# Patient Record
Sex: Female | Born: 1985 | Race: White | Hispanic: No | Smoking: Former smoker
Health system: Southern US, Community
[De-identification: ages and names within clinical notes are randomized; demographics above are authoritative.]

## PROBLEM LIST (undated history)

## (undated) ENCOUNTER — Inpatient Hospital Stay (HOSPITAL_COMMUNITY): Payer: Self-pay

## (undated) DIAGNOSIS — Q6239 Other obstructive defects of renal pelvis and ureter: Secondary | ICD-10-CM

## (undated) DIAGNOSIS — N289 Disorder of kidney and ureter, unspecified: Secondary | ICD-10-CM

## (undated) DIAGNOSIS — Z87442 Personal history of urinary calculi: Secondary | ICD-10-CM

## (undated) DIAGNOSIS — K760 Fatty (change of) liver, not elsewhere classified: Secondary | ICD-10-CM

## (undated) DIAGNOSIS — N949 Unspecified condition associated with female genital organs and menstrual cycle: Principal | ICD-10-CM

## (undated) DIAGNOSIS — G8929 Other chronic pain: Secondary | ICD-10-CM

## (undated) DIAGNOSIS — K219 Gastro-esophageal reflux disease without esophagitis: Secondary | ICD-10-CM

## (undated) DIAGNOSIS — M545 Low back pain, unspecified: Secondary | ICD-10-CM

## (undated) DIAGNOSIS — R109 Unspecified abdominal pain: Secondary | ICD-10-CM

## (undated) DIAGNOSIS — I10 Essential (primary) hypertension: Secondary | ICD-10-CM

## (undated) DIAGNOSIS — R319 Hematuria, unspecified: Secondary | ICD-10-CM

## (undated) DIAGNOSIS — S83519A Sprain of anterior cruciate ligament of unspecified knee, initial encounter: Secondary | ICD-10-CM

## (undated) DIAGNOSIS — Q6211 Congenital occlusion of ureteropelvic junction: Secondary | ICD-10-CM

## (undated) DIAGNOSIS — Z87440 Personal history of urinary (tract) infections: Secondary | ICD-10-CM

## (undated) HISTORY — DX: Other obstructive defects of renal pelvis and ureter: Q62.39

## (undated) HISTORY — PX: NEPHRECTOMY: SHX65

## (undated) HISTORY — PX: CHOLECYSTECTOMY: SHX55

## (undated) HISTORY — DX: Personal history of urinary calculi: Z87.442

## (undated) HISTORY — PX: OTHER SURGICAL HISTORY: SHX169

## (undated) HISTORY — DX: Personal history of urinary (tract) infections: Z87.440

## (undated) HISTORY — PX: NASAL SINUS SURGERY: SHX719

## (undated) HISTORY — DX: Unspecified condition associated with female genital organs and menstrual cycle: N94.9

## (undated) HISTORY — DX: Fatty (change of) liver, not elsewhere classified: K76.0

## (undated) HISTORY — DX: Sprain of anterior cruciate ligament of unspecified knee, initial encounter: S83.519A

## (undated) HISTORY — DX: Disorder of kidney and ureter, unspecified: N28.9

## (undated) HISTORY — PX: TONSILLECTOMY: SUR1361

## (undated) HISTORY — DX: Essential (primary) hypertension: I10

---

## 2009-03-21 ENCOUNTER — Emergency Department (HOSPITAL_COMMUNITY): Admission: EM | Admit: 2009-03-21 | Discharge: 2009-03-21 | Payer: Self-pay | Admitting: Emergency Medicine

## 2009-05-02 ENCOUNTER — Emergency Department (HOSPITAL_COMMUNITY): Admission: EM | Admit: 2009-05-02 | Discharge: 2009-05-03 | Payer: Self-pay | Admitting: Emergency Medicine

## 2009-05-13 ENCOUNTER — Emergency Department (HOSPITAL_COMMUNITY): Admission: EM | Admit: 2009-05-13 | Discharge: 2009-05-13 | Payer: Self-pay | Admitting: Emergency Medicine

## 2009-06-01 ENCOUNTER — Emergency Department (HOSPITAL_COMMUNITY): Admission: EM | Admit: 2009-06-01 | Discharge: 2009-06-02 | Payer: Self-pay | Admitting: Emergency Medicine

## 2009-08-10 ENCOUNTER — Emergency Department (HOSPITAL_COMMUNITY): Admission: EM | Admit: 2009-08-10 | Discharge: 2009-08-10 | Payer: Self-pay | Admitting: Emergency Medicine

## 2009-08-24 ENCOUNTER — Emergency Department (HOSPITAL_COMMUNITY): Admission: EM | Admit: 2009-08-24 | Discharge: 2009-08-24 | Payer: Self-pay | Admitting: Emergency Medicine

## 2009-09-16 ENCOUNTER — Emergency Department (HOSPITAL_COMMUNITY): Admission: EM | Admit: 2009-09-16 | Discharge: 2009-09-16 | Payer: Self-pay | Admitting: Emergency Medicine

## 2009-09-17 ENCOUNTER — Ambulatory Visit (HOSPITAL_COMMUNITY): Admission: RE | Admit: 2009-09-17 | Discharge: 2009-09-17 | Payer: Self-pay | Admitting: Emergency Medicine

## 2009-10-02 ENCOUNTER — Emergency Department (HOSPITAL_COMMUNITY): Admission: EM | Admit: 2009-10-02 | Discharge: 2009-10-02 | Payer: Self-pay | Admitting: Emergency Medicine

## 2009-10-12 ENCOUNTER — Other Ambulatory Visit: Admission: RE | Admit: 2009-10-12 | Discharge: 2009-10-12 | Payer: Self-pay | Admitting: Obstetrics & Gynecology

## 2009-11-08 ENCOUNTER — Emergency Department (HOSPITAL_COMMUNITY): Admission: EM | Admit: 2009-11-08 | Discharge: 2009-11-08 | Payer: Self-pay | Admitting: Emergency Medicine

## 2009-11-11 ENCOUNTER — Ambulatory Visit (HOSPITAL_COMMUNITY): Admission: RE | Admit: 2009-11-11 | Discharge: 2009-11-11 | Payer: Self-pay | Admitting: Obstetrics & Gynecology

## 2009-11-24 ENCOUNTER — Ambulatory Visit (HOSPITAL_COMMUNITY): Admission: RE | Admit: 2009-11-24 | Discharge: 2009-11-24 | Payer: Self-pay | Admitting: Obstetrics & Gynecology

## 2009-12-22 ENCOUNTER — Ambulatory Visit (HOSPITAL_COMMUNITY): Admission: RE | Admit: 2009-12-22 | Discharge: 2009-12-22 | Payer: Self-pay | Admitting: Obstetrics & Gynecology

## 2009-12-28 ENCOUNTER — Inpatient Hospital Stay (HOSPITAL_COMMUNITY): Admission: AD | Admit: 2009-12-28 | Discharge: 2009-12-28 | Payer: Self-pay | Admitting: Obstetrics & Gynecology

## 2010-01-19 ENCOUNTER — Ambulatory Visit (HOSPITAL_COMMUNITY)
Admission: RE | Admit: 2010-01-19 | Discharge: 2010-01-19 | Payer: Self-pay | Source: Home / Self Care | Attending: Obstetrics & Gynecology | Admitting: Obstetrics & Gynecology

## 2010-01-20 ENCOUNTER — Emergency Department (HOSPITAL_COMMUNITY)
Admission: EM | Admit: 2010-01-20 | Discharge: 2010-01-20 | Payer: Self-pay | Source: Home / Self Care | Admitting: Emergency Medicine

## 2010-02-16 ENCOUNTER — Ambulatory Visit (HOSPITAL_COMMUNITY)
Admission: RE | Admit: 2010-02-16 | Discharge: 2010-02-16 | Payer: Self-pay | Source: Home / Self Care | Attending: Obstetrics & Gynecology | Admitting: Obstetrics & Gynecology

## 2010-02-19 ENCOUNTER — Inpatient Hospital Stay (HOSPITAL_COMMUNITY)
Admission: AD | Admit: 2010-02-19 | Discharge: 2010-02-19 | Payer: Self-pay | Source: Home / Self Care | Attending: Family Medicine | Admitting: Family Medicine

## 2010-02-25 ENCOUNTER — Inpatient Hospital Stay (HOSPITAL_COMMUNITY)
Admission: AD | Admit: 2010-02-25 | Discharge: 2010-02-25 | Payer: Self-pay | Source: Home / Self Care | Attending: Obstetrics and Gynecology | Admitting: Obstetrics and Gynecology

## 2010-02-26 ENCOUNTER — Ambulatory Visit (HOSPITAL_COMMUNITY)
Admission: RE | Admit: 2010-02-26 | Discharge: 2010-02-26 | Payer: Self-pay | Source: Home / Self Care | Attending: Obstetrics and Gynecology | Admitting: Obstetrics and Gynecology

## 2010-03-01 LAB — URINALYSIS, ROUTINE W REFLEX MICROSCOPIC
Bilirubin Urine: NEGATIVE
Hgb urine dipstick: NEGATIVE
Ketones, ur: NEGATIVE mg/dL
Nitrite: NEGATIVE
Protein, ur: NEGATIVE mg/dL
Specific Gravity, Urine: 1.02 (ref 1.005–1.030)
Urine Glucose, Fasting: NEGATIVE mg/dL
Urobilinogen, UA: 0.2 mg/dL (ref 0.0–1.0)
pH: 6.5 (ref 5.0–8.0)

## 2010-03-01 LAB — WET PREP, GENITAL
Clue Cells Wet Prep HPF POC: NONE SEEN
Clue Cells Wet Prep HPF POC: NONE SEEN
Trich, Wet Prep: NONE SEEN
Trich, Wet Prep: NONE SEEN
Yeast Wet Prep HPF POC: NONE SEEN
Yeast Wet Prep HPF POC: NONE SEEN

## 2010-03-01 LAB — GC/CHLAMYDIA PROBE AMP, GENITAL
Chlamydia, DNA Probe: NEGATIVE
Chlamydia, DNA Probe: NEGATIVE
GC Probe Amp, Genital: NEGATIVE
GC Probe Amp, Genital: NEGATIVE

## 2010-03-01 LAB — URINE MICROSCOPIC-ADD ON

## 2010-03-01 LAB — FETAL FIBRONECTIN: Fetal Fibronectin: NEGATIVE

## 2010-03-01 LAB — STREP B DNA PROBE: Strep Group B Ag: NEGATIVE

## 2010-03-16 ENCOUNTER — Ambulatory Visit (HOSPITAL_COMMUNITY)
Admission: RE | Admit: 2010-03-16 | Discharge: 2010-03-16 | Payer: Self-pay | Source: Ambulatory Visit | Attending: Obstetrics & Gynecology | Admitting: Obstetrics & Gynecology

## 2010-03-16 DIAGNOSIS — K649 Unspecified hemorrhoids: Secondary | ICD-10-CM

## 2010-03-20 ENCOUNTER — Inpatient Hospital Stay (HOSPITAL_COMMUNITY)
Admission: AD | Admit: 2010-03-20 | Discharge: 2010-03-20 | Disposition: A | Discharge status: Home / Self Care | Payer: Medicaid Other | Source: Ambulatory Visit | Attending: Obstetrics & Gynecology | Admitting: Obstetrics & Gynecology

## 2010-03-20 DIAGNOSIS — O4700 False labor before 37 completed weeks of gestation, unspecified trimester: Secondary | ICD-10-CM | POA: Insufficient documentation

## 2010-03-20 DIAGNOSIS — O228X9 Other venous complications in pregnancy, unspecified trimester: Secondary | ICD-10-CM | POA: Insufficient documentation

## 2010-03-20 DIAGNOSIS — K649 Unspecified hemorrhoids: Secondary | ICD-10-CM | POA: Insufficient documentation

## 2010-03-20 LAB — URINE MICROSCOPIC-ADD ON

## 2010-03-20 LAB — URINALYSIS, ROUTINE W REFLEX MICROSCOPIC
Bilirubin Urine: NEGATIVE
Ketones, ur: NEGATIVE mg/dL
Nitrite: NEGATIVE
Protein, ur: NEGATIVE mg/dL
Specific Gravity, Urine: 1.015 (ref 1.005–1.030)
Urine Glucose, Fasting: NEGATIVE mg/dL
Urobilinogen, UA: 0.2 mg/dL (ref 0.0–1.0)
pH: 6 (ref 5.0–8.0)

## 2010-03-25 ENCOUNTER — Inpatient Hospital Stay (HOSPITAL_COMMUNITY)
Admission: AD | Admit: 2010-03-25 | Discharge: 2010-03-25 | Disposition: A | Discharge status: Home / Self Care | Payer: Medicaid Other | Source: Ambulatory Visit | Attending: Obstetrics & Gynecology | Admitting: Obstetrics & Gynecology

## 2010-03-25 DIAGNOSIS — O4700 False labor before 37 completed weeks of gestation, unspecified trimester: Secondary | ICD-10-CM

## 2010-03-25 LAB — URINALYSIS, ROUTINE W REFLEX MICROSCOPIC
Bilirubin Urine: NEGATIVE
Hgb urine dipstick: NEGATIVE
Ketones, ur: NEGATIVE mg/dL
Nitrite: NEGATIVE
Protein, ur: NEGATIVE mg/dL
Specific Gravity, Urine: 1.02 (ref 1.005–1.030)
Urine Glucose, Fasting: NEGATIVE mg/dL
Urobilinogen, UA: 0.2 mg/dL (ref 0.0–1.0)
pH: 6 (ref 5.0–8.0)

## 2010-03-25 LAB — URINE MICROSCOPIC-ADD ON

## 2010-04-01 ENCOUNTER — Inpatient Hospital Stay (HOSPITAL_COMMUNITY)
Admission: AD | Admit: 2010-04-01 | Discharge: 2010-04-04 | DRG: 775 | Disposition: A | Discharge status: Home / Self Care | Payer: Medicaid Other | Source: Ambulatory Visit | Attending: Obstetrics and Gynecology | Admitting: Obstetrics and Gynecology

## 2010-04-01 DIAGNOSIS — O4290 Premature rupture of membranes, unspecified as to length of time between rupture and onset of labor, unspecified weeks of gestation: Principal | ICD-10-CM | POA: Diagnosis present

## 2010-04-01 LAB — CBC
HCT: 38 % (ref 36.0–46.0)
Hemoglobin: 12.6 g/dL (ref 12.0–15.0)
MCH: 29.4 pg (ref 26.0–34.0)
MCHC: 33.2 g/dL (ref 30.0–36.0)
MCV: 88.8 fL (ref 78.0–100.0)
Platelets: 243 10*3/uL (ref 150–400)
RBC: 4.28 MIL/uL (ref 3.87–5.11)
RDW: 13.7 % (ref 11.5–15.5)
WBC: 13 10*3/uL — ABNORMAL HIGH (ref 4.0–10.5)

## 2010-04-01 LAB — RPR: RPR Ser Ql: NONREACTIVE

## 2010-04-02 DIAGNOSIS — O4290 Premature rupture of membranes, unspecified as to length of time between rupture and onset of labor, unspecified weeks of gestation: Secondary | ICD-10-CM

## 2010-04-29 LAB — URINALYSIS, ROUTINE W REFLEX MICROSCOPIC
Bilirubin Urine: NEGATIVE
Glucose, UA: NEGATIVE mg/dL
Hgb urine dipstick: NEGATIVE
Ketones, ur: NEGATIVE mg/dL
Nitrite: NEGATIVE
Protein, ur: NEGATIVE mg/dL
Specific Gravity, Urine: 1.027 (ref 1.005–1.030)
Urobilinogen, UA: 0.2 mg/dL (ref 0.0–1.0)
pH: 6 (ref 5.0–8.0)

## 2010-04-29 LAB — URINE CULTURE

## 2010-04-29 LAB — URINE MICROSCOPIC-ADD ON

## 2010-04-30 LAB — BASIC METABOLIC PANEL
BUN: 11 mg/dL (ref 6–23)
Chloride: 105 mEq/L (ref 96–112)
GFR calc non Af Amer: >60 mL/min (ref >60)
Glucose, Bld: 92 mg/dL (ref 70–99)
Potassium: 3.6 mEq/L (ref 3.5–5.1)

## 2010-04-30 LAB — URINALYSIS, ROUTINE W REFLEX MICROSCOPIC
Glucose, UA: NEGATIVE mg/dL
Hgb urine dipstick: NEGATIVE
Specific Gravity, Urine: >1.03 — ABNORMAL HIGH (ref 1.005–1.030)
Urobilinogen, UA: 0.2 mg/dL (ref 0.0–1.0)
pH: 6 (ref 5.0–8.0)

## 2010-04-30 LAB — CBC
HCT: 36.4 % (ref 36.0–46.0)
MCHC: 34.7 g/dL (ref 30.0–36.0)
MCV: 87.3 fL (ref 78.0–100.0)
RDW: 13.1 % (ref 11.5–15.5)

## 2010-04-30 LAB — URINE CULTURE: Culture  Setup Time: 201108050009

## 2010-04-30 LAB — DIFFERENTIAL
Basophils Absolute: 0.1 10*3/uL (ref 0.0–0.1)
Basophils Relative: 1 % (ref 0–1)
Eosinophils Relative: 1 % (ref 0–5)
Monocytes Absolute: 1.1 10*3/uL — ABNORMAL HIGH (ref 0.1–1.0)

## 2010-05-02 LAB — URINALYSIS, ROUTINE W REFLEX MICROSCOPIC
Bilirubin Urine: NEGATIVE
Ketones, ur: NEGATIVE mg/dL
Nitrite: NEGATIVE
Urobilinogen, UA: 0.2 mg/dL (ref 0.0–1.0)

## 2010-05-02 LAB — COMPREHENSIVE METABOLIC PANEL
AST: 20 U/L (ref 0–37)
BUN: 16 mg/dL (ref 6–23)
CO2: 26 mEq/L (ref 19–32)
Calcium: 8.8 mg/dL (ref 8.4–10.5)
Creatinine, Ser: 1.05 mg/dL (ref 0.4–1.2)
GFR calc Af Amer: >60 mL/min (ref >60)
GFR calc non Af Amer: >60 mL/min (ref >60)

## 2010-05-02 LAB — CBC
Hemoglobin: 12.6 g/dL (ref 12.0–15.0)
MCH: 29.9 pg (ref 26.0–34.0)
MCHC: 34.4 g/dL (ref 30.0–36.0)
Platelets: 293 10*3/uL (ref 150–400)

## 2010-05-02 LAB — POCT PREGNANCY, URINE: Preg Test, Ur: POSITIVE

## 2010-05-02 LAB — BASIC METABOLIC PANEL
BUN: 11 mg/dL (ref 6–23)
Creatinine, Ser: 1.01 mg/dL (ref 0.4–1.2)
GFR calc non Af Amer: >60 mL/min (ref >60)

## 2010-05-04 LAB — PREGNANCY, URINE: Preg Test, Ur: NEGATIVE

## 2010-05-04 LAB — URINALYSIS, ROUTINE W REFLEX MICROSCOPIC
Glucose, UA: NEGATIVE mg/dL
Nitrite: NEGATIVE
Protein, ur: NEGATIVE mg/dL
Urobilinogen, UA: 0.2 mg/dL (ref 0.0–1.0)

## 2010-05-06 LAB — DIFFERENTIAL
Basophils Absolute: 0.1 10*3/uL (ref 0.0–0.1)
Basophils Relative: 1 % (ref 0–1)
Eosinophils Relative: 2 % (ref 0–5)
Lymphocytes Relative: 24 % (ref 12–46)
Monocytes Absolute: 0.7 10*3/uL (ref 0.1–1.0)
Neutro Abs: 7.1 10*3/uL (ref 1.7–7.7)

## 2010-05-06 LAB — COMPREHENSIVE METABOLIC PANEL
AST: 49 U/L — ABNORMAL HIGH (ref 0–37)
Alkaline Phosphatase: 67 U/L (ref 39–117)
BUN: 12 mg/dL (ref 6–23)
CO2: 22 mEq/L (ref 19–32)
Chloride: 107 mEq/L (ref 96–112)
Creatinine, Ser: 0.9 mg/dL (ref 0.4–1.2)
GFR calc non Af Amer: >60 mL/min (ref >60)
Potassium: 4.1 mEq/L (ref 3.5–5.1)
Total Bilirubin: 0.8 mg/dL (ref 0.3–1.2)

## 2010-05-06 LAB — URINALYSIS, ROUTINE W REFLEX MICROSCOPIC
Ketones, ur: NEGATIVE mg/dL
Nitrite: NEGATIVE
Protein, ur: NEGATIVE mg/dL
Urobilinogen, UA: 1 mg/dL (ref 0.0–1.0)

## 2010-05-06 LAB — CBC
HCT: 43.4 % (ref 36.0–46.0)
MCV: 90.2 fL (ref 78.0–100.0)
RBC: 4.82 MIL/uL (ref 3.87–5.11)
WBC: 10.6 10*3/uL — ABNORMAL HIGH (ref 4.0–10.5)

## 2010-05-06 LAB — POCT PREGNANCY, URINE: Preg Test, Ur: NEGATIVE

## 2010-05-06 LAB — LIPASE, BLOOD: Lipase: 20 U/L (ref 11–59)

## 2010-05-10 LAB — URINALYSIS, ROUTINE W REFLEX MICROSCOPIC
Glucose, UA: NEGATIVE mg/dL
Hgb urine dipstick: NEGATIVE
Ketones, ur: NEGATIVE mg/dL
Nitrite: NEGATIVE
Protein, ur: NEGATIVE mg/dL
Urobilinogen, UA: 0.2 mg/dL (ref 0.0–1.0)

## 2010-05-10 LAB — BASIC METABOLIC PANEL
GFR calc non Af Amer: >60 mL/min (ref >60)
Potassium: 4 mEq/L (ref 3.5–5.1)
Sodium: 138 mEq/L (ref 135–145)

## 2010-06-20 ENCOUNTER — Emergency Department (HOSPITAL_COMMUNITY): Payer: Medicaid Other

## 2010-06-20 ENCOUNTER — Emergency Department (HOSPITAL_COMMUNITY)
Admission: EM | Admit: 2010-06-20 | Discharge: 2010-06-20 | Disposition: A | Discharge status: Home / Self Care | Payer: Medicaid Other | Attending: Emergency Medicine | Admitting: Emergency Medicine

## 2010-06-20 DIAGNOSIS — R071 Chest pain on breathing: Secondary | ICD-10-CM | POA: Insufficient documentation

## 2010-06-20 DIAGNOSIS — Q649 Congenital malformation of urinary system, unspecified: Secondary | ICD-10-CM | POA: Insufficient documentation

## 2010-06-20 DIAGNOSIS — K219 Gastro-esophageal reflux disease without esophagitis: Secondary | ICD-10-CM | POA: Insufficient documentation

## 2010-06-20 LAB — CBC
HCT: 38.6 % (ref 36.0–46.0)
Hemoglobin: 12.8 g/dL (ref 12.0–15.0)
MCH: 29.2 pg (ref 26.0–34.0)
MCHC: 33.2 g/dL (ref 30.0–36.0)
MCV: 87.9 fL (ref 78.0–100.0)
Platelets: 271 10*3/uL (ref 150–400)
RBC: 4.39 MIL/uL (ref 3.87–5.11)
RDW: 13 % (ref 11.5–15.5)
WBC: 11.3 10*3/uL — ABNORMAL HIGH (ref 4.0–10.5)

## 2010-06-20 LAB — COMPREHENSIVE METABOLIC PANEL WITH GFR
ALT: 36 U/L — ABNORMAL HIGH (ref 0–35)
AST: 27 U/L (ref 0–37)
Albumin: 3.7 g/dL (ref 3.5–5.2)
Alkaline Phosphatase: 86 U/L (ref 39–117)
BUN: 24 mg/dL — ABNORMAL HIGH (ref 6–23)
CO2: 27 meq/L (ref 19–32)
Calcium: 8.9 mg/dL (ref 8.4–10.5)
Chloride: 106 meq/L (ref 96–112)
Creatinine, Ser: 1.07 mg/dL (ref 0.4–1.2)
GFR calc non Af Amer: >60 mL/min
Glucose, Bld: 99 mg/dL (ref 70–99)
Potassium: 3.9 meq/L (ref 3.5–5.1)
Sodium: 141 meq/L (ref 135–145)
Total Bilirubin: 0.2 mg/dL — ABNORMAL LOW (ref 0.3–1.2)
Total Protein: 6.8 g/dL (ref 6.0–8.3)

## 2010-06-20 LAB — DIFFERENTIAL
Basophils Absolute: 0 10*3/uL (ref 0.0–0.1)
Eosinophils Absolute: 0.4 10*3/uL (ref 0.0–0.7)
Eosinophils Relative: 3 % (ref 0–5)
Monocytes Absolute: 0.8 10*3/uL (ref 0.1–1.0)

## 2010-06-20 MED ORDER — IOHEXOL 350 MG/ML SOLN: 100.0000 mL | Freq: Once | INTRAVENOUS | Status: DC | PRN

## 2010-08-03 ENCOUNTER — Emergency Department (HOSPITAL_COMMUNITY)
Admission: EM | Admit: 2010-08-03 | Discharge: 2010-08-03 | Disposition: A | Discharge status: Home / Self Care | Payer: Self-pay | Attending: Emergency Medicine | Admitting: Emergency Medicine

## 2010-08-03 ENCOUNTER — Emergency Department (HOSPITAL_COMMUNITY): Payer: Self-pay

## 2010-08-03 DIAGNOSIS — K219 Gastro-esophageal reflux disease without esophagitis: Secondary | ICD-10-CM | POA: Insufficient documentation

## 2010-08-03 DIAGNOSIS — Z79899 Other long term (current) drug therapy: Secondary | ICD-10-CM | POA: Insufficient documentation

## 2010-08-03 DIAGNOSIS — R300 Dysuria: Secondary | ICD-10-CM | POA: Insufficient documentation

## 2010-08-03 LAB — DIFFERENTIAL
Basophils Absolute: 0 10*3/uL (ref 0.0–0.1)
Basophils Relative: 0 % (ref 0–1)
Lymphocytes Relative: 28 % (ref 12–46)
Neutro Abs: 6.4 10*3/uL (ref 1.7–7.7)
Neutrophils Relative %: 63 % (ref 43–77)

## 2010-08-03 LAB — BASIC METABOLIC PANEL
BUN: 14 mg/dL (ref 6–23)
CO2: 27 mEq/L (ref 19–32)
Chloride: 100 mEq/L (ref 96–112)
GFR calc non Af Amer: >60 mL/min (ref >60)
Glucose, Bld: 84 mg/dL (ref 70–99)
Potassium: 3.6 mEq/L (ref 3.5–5.1)
Sodium: 136 mEq/L (ref 135–145)

## 2010-08-03 LAB — URINALYSIS, ROUTINE W REFLEX MICROSCOPIC
Glucose, UA: NEGATIVE mg/dL
Ketones, ur: NEGATIVE mg/dL
Leukocytes, UA: NEGATIVE
Nitrite: NEGATIVE
Specific Gravity, Urine: 1.01 (ref 1.005–1.030)
pH: 5 (ref 5.0–8.0)

## 2010-08-03 LAB — CBC
HCT: 39.9 % (ref 36.0–46.0)
Hemoglobin: 13.5 g/dL (ref 12.0–15.0)
MCHC: 33.8 g/dL (ref 30.0–36.0)
RBC: 4.58 MIL/uL (ref 3.87–5.11)
WBC: 10.1 10*3/uL (ref 4.0–10.5)

## 2011-04-16 ENCOUNTER — Emergency Department (HOSPITAL_COMMUNITY)
Admission: EM | Admit: 2011-04-16 | Discharge: 2011-04-16 | Disposition: A | Discharge status: Home / Self Care | Payer: Self-pay | Attending: Emergency Medicine | Admitting: Emergency Medicine

## 2011-04-16 ENCOUNTER — Emergency Department (HOSPITAL_COMMUNITY): Payer: Self-pay

## 2011-04-16 ENCOUNTER — Encounter (HOSPITAL_COMMUNITY): Payer: Self-pay | Admitting: *Deleted

## 2011-04-16 DIAGNOSIS — M549 Dorsalgia, unspecified: Secondary | ICD-10-CM | POA: Insufficient documentation

## 2011-04-16 DIAGNOSIS — R300 Dysuria: Secondary | ICD-10-CM | POA: Insufficient documentation

## 2011-04-16 DIAGNOSIS — X58XXXA Exposure to other specified factors, initial encounter: Secondary | ICD-10-CM | POA: Insufficient documentation

## 2011-04-16 DIAGNOSIS — R109 Unspecified abdominal pain: Secondary | ICD-10-CM | POA: Insufficient documentation

## 2011-04-16 DIAGNOSIS — T148XXA Other injury of unspecified body region, initial encounter: Secondary | ICD-10-CM | POA: Insufficient documentation

## 2011-04-16 DIAGNOSIS — Z79899 Other long term (current) drug therapy: Secondary | ICD-10-CM | POA: Insufficient documentation

## 2011-04-16 HISTORY — DX: Congenital occlusion of ureteropelvic junction: Q62.11

## 2011-04-16 HISTORY — DX: Other obstructive defects of renal pelvis and ureter: Q62.39

## 2011-04-16 LAB — BASIC METABOLIC PANEL
CO2: 23 mEq/L (ref 19–32)
Chloride: 104 mEq/L (ref 96–112)
Sodium: 136 mEq/L (ref 135–145)

## 2011-04-16 LAB — URINALYSIS, ROUTINE W REFLEX MICROSCOPIC
Hgb urine dipstick: NEGATIVE
Specific Gravity, Urine: 1.02 (ref 1.005–1.030)
Urobilinogen, UA: 0.2 mg/dL (ref 0.0–1.0)
pH: 5.5 (ref 5.0–8.0)

## 2011-04-16 LAB — CBC
HCT: 44.8 % (ref 36.0–46.0)
Platelets: 275 10*3/uL (ref 150–400)
RBC: 5.13 MIL/uL — ABNORMAL HIGH (ref 3.87–5.11)
RDW: 13.4 % (ref 11.5–15.5)
WBC: 8 10*3/uL (ref 4.0–10.5)

## 2011-04-16 LAB — DIFFERENTIAL
Basophils Absolute: 0 10*3/uL (ref 0.0–0.1)
Lymphocytes Relative: 27 % (ref 12–46)
Lymphs Abs: 2.2 10*3/uL (ref 0.7–4.0)
Monocytes Absolute: 0.6 10*3/uL (ref 0.1–1.0)
Neutro Abs: 4.9 10*3/uL (ref 1.7–7.7)

## 2011-04-16 LAB — POCT PREGNANCY, URINE: Preg Test, Ur: NEGATIVE

## 2011-04-16 MED ORDER — SODIUM CHLORIDE 0.9 % IV BOLUS (SEPSIS): 1000.0000 mL | Freq: Once | INTRAVENOUS | Status: DC

## 2011-04-16 MED ORDER — ONDANSETRON 8 MG PO TBDP
8.0000 mg | ORAL_TABLET | Freq: Once | ORAL | Status: AC
  Administered 2011-04-16: 8 mg via ORAL
  Filled 2011-04-16: qty 1

## 2011-04-16 MED ORDER — SODIUM CHLORIDE 0.9 % IV SOLN: Freq: Once | INTRAVENOUS | Status: DC

## 2011-04-16 MED ORDER — HYDROCODONE-ACETAMINOPHEN 5-325 MG PO TABS: 1.0000 | ORAL_TABLET | Freq: Four times a day (QID) | ORAL | Status: AC | PRN

## 2011-04-16 MED ORDER — ONDANSETRON 4 MG PO TBDP
4.0000 mg | ORAL_TABLET | Freq: Once | ORAL | Status: AC
  Administered 2011-04-16: 4 mg via ORAL
  Filled 2011-04-16: qty 1

## 2011-04-16 MED ORDER — HYDROMORPHONE HCL PF 2 MG/ML IJ SOLN
2.0000 mg | Freq: Once | INTRAMUSCULAR | Status: AC
  Administered 2011-04-16: 2 mg via INTRAMUSCULAR
  Filled 2011-04-16: qty 1

## 2011-04-16 MED ORDER — CYCLOBENZAPRINE HCL 10 MG PO TABS: 10.0000 mg | ORAL_TABLET | Freq: Two times a day (BID) | ORAL | Status: AC | PRN

## 2011-04-16 MED ORDER — HYDROMORPHONE HCL PF 1 MG/ML IJ SOLN
1.0000 mg | Freq: Once | INTRAMUSCULAR | Status: AC
  Administered 2011-04-16: 1 mg via INTRAMUSCULAR
  Filled 2011-04-16: qty 1

## 2011-04-16 NOTE — ED Notes (Signed)
C/o left flank pain and left mid back pain x 3 days, worse since this am.  Pt reports pain would go away with urination but not this morning.  Also reports pain with deep breath.  C/o n/v/d.

## 2011-04-16 NOTE — ED Notes (Signed)
Pt sipping on diet coke, tolerating well.  nad noted.

## 2011-04-16 NOTE — Discharge Instructions (Signed)
Muscle Strain A muscle strain (pulled muscle) happens when a muscle is over-stretched. Recovery usually takes 5 to 6 weeks.  HOME CARE   Put ice on the injured area.   Put ice in a plastic bag.   Place a towel between your skin and the bag.   Leave the ice on for 15 to 20 minutes at a time, every hour for the first 2 days.   Do not use the muscle for several days or until your doctor says you can. Do not use the muscle if you have pain.   Wrap the injured area with an elastic bandage for comfort. Do not put it on too tightly.   Only take medicine as told by your doctor.   Warm up before exercise. This helps prevent muscle strains.  GET HELP RIGHT AWAY IF:  There is increased pain or puffiness (swelling) in the affected area. MAKE SURE YOU:   Understand these instructions.   Will watch your condition.   Will get help right away if you are not doing well or get worse.  Document Released: 11/10/2007 Document Revised: 10/13/2010 Document Reviewed: 11/10/2007 [facility] Patient Information 2012 [place], [place].    Take the meds as directed.  Follow up with dr. Jerre Simon for regular evaluation of your kidney.

## 2011-04-16 NOTE — ED Notes (Signed)
Pt also reports severe nausea.  PA notified and orders received.

## 2011-04-16 NOTE — ED Notes (Signed)
Multiple attempts by 3 RN's to obtain IV access.  PA notified and orders received.

## 2011-04-16 NOTE — ED Notes (Signed)
IV access attempted x 3 with no success.

## 2011-04-16 NOTE — ED Provider Notes (Signed)
History     CSN: [id number]  Arrival date & time 04/16/11  5621   First MD Initiated Contact with Patient 04/16/11 [phone]      Chief Complaint  Patient presents with  . Flank Pain    (Consider location/radiation/quality/duration/timing/severity/associated sxs/prior treatment) Patient is a 26 y.o. female presenting with flank pain. The history is provided by the patient. No language interpreter was used.  Flank Pain This is a new problem. The current episode started yesterday. The problem occurs constantly. The problem has been unchanged. Pertinent negatives include no chills, fever or nausea. Associated symptoms comments: Dysuria but no other UTI sxs.. The symptoms are aggravated by coughing (palpation and movement). She has tried nothing for the symptoms.    Past Medical History  Diagnosis Date  . Congenital obstruction of ureteropelvic junction (UPJ)     Past Surgical History  Procedure Date  . Nephrectomy     right side    No family history on file.  History  Substance Use Topics  . Smoking status: Never Smoker   . Smokeless tobacco: Not on file  . Alcohol Use: No    OB History    Grav Para Term Preterm Abortions TAB SAB Ect Mult Living                  Review of Systems  Constitutional: Negative for fever and chills.  Gastrointestinal: Negative for nausea.  Genitourinary: Positive for dysuria and flank pain. Negative for urgency, frequency and hematuria.    Allergies  Cinnamon flavor; Codeine; Ibuprofen; and Levaquin  Home Medications   Current Outpatient Rx  Name Route Sig Dispense Refill  . LORATADINE 10 MG PO TABS Oral Take 10 mg by mouth daily.    Marland Kitchen OMEPRAZOLE 20 MG PO CPDR Oral Take 20 mg by mouth every evening.      BP 124/76  Pulse 89  Temp 97.8 F (36.6 C)  Resp 20  Ht 5\' 9"  (1.753 m)  Wt 370 lb (167.831 kg)  BMI 54.64 kg/m2  SpO2 98%  LMP 04/10/2011  Physical Exam  Nursing note and vitals reviewed. Constitutional: She is oriented  to person, place, and time. She appears well-developed and well-nourished. No distress.  HENT:  Head: Normocephalic and atraumatic.  Eyes: EOM are normal.  Neck: Normal range of motion.  Cardiovascular: Normal rate, regular rhythm and normal heart sounds.   Pulmonary/Chest: Effort normal and breath sounds normal. No respiratory distress. She has no wheezes. She has no rales. She exhibits no tenderness.  Abdominal: Soft. She exhibits no distension and no mass. There is no tenderness. There is no rebound and no guarding.  Genitourinary:       L CVAT  Musculoskeletal: Normal range of motion.       Arms: Neurological: She is alert and oriented to person, place, and time.  Skin: Skin is warm and dry.  Psychiatric: She has a normal mood and affect. Judgment normal.    ED Course  Procedures (including critical care time)  Labs Reviewed  CBC - Abnormal; Notable for the following:    RBC 5.13 (*)    Hemoglobin 15.2 (*)    All other components within normal limits  URINALYSIS, ROUTINE W REFLEX MICROSCOPIC  POCT PREGNANCY, URINE  DIFFERENTIAL  BASIC METABOLIC PANEL   Ct Abdomen Pelvis Wo Contrast  04/16/2011  *RADIOLOGY REPORT*  Clinical Data: Left abdominal pain.  Previous right nephrectomy. History of nephrolithiasis.  CT ABDOMEN AND PELVIS WITHOUT CONTRAST  Technique:  Multidetector  CT imaging of the abdomen and pelvis was performed following the standard protocol without intravenous contrast.  Comparison: 08/03/2010  Findings: Visualized lung bases clear.  Unremarkable uninfused evaluation of liver, gallbladder, spleen, adrenal glands.  The right kidney is surgically absent.  Unremarkable pancreas, abdominal aorta, stomach, small bowel.  Normal appendix.  The colon is nondilated, unremarkable.  Urinary bladder physiologically distended.  Uterus and adnexal regions unremarkable.  There is mild compensatory hypertrophy of the left kidney.  No nephrolithiasis, hydronephrosis, or ureteral  calculus.  No ascites.  No free air.  No mesenteric, retroperitoneal, or pelvic adenopathy evident.  Lumbar spine unremarkable.  IMPRESSION:  1.  Negative for nephrolithiasis or hydronephrosis. 2.  Previous right nephrectomy. 3.  No acute abdominal process.  Original Report Authenticated By: Osa Craver, M.D.   Dg Chest 2 View  04/16/2011  *RADIOLOGY REPORT*  Clinical Data: Flank pain  CHEST - 2 VIEW  Comparison: Chest radiograph 06/20/2010  Findings: Normal mediastinum and cardiac silhouette.  Normal pulmonary  vasculature.  No evidence of effusion, infiltrate, or pneumothorax.  No acute bony abnormality.  IMPRESSION: Normal chest radiograph.  Original Report Authenticated By: Genevive Bi, M.D.     No diagnosis found.    MDM  Discussed labs and radiology results.  Referral to [facility].        Worthy Rancher, PA 04/16/11 1525

## 2011-04-17 NOTE — ED Provider Notes (Signed)
Medical screening examination/treatment/procedure(s) were performed by non-physician practitioner and as supervising physician I was immediately available for consultation/collaboration.  Nicoletta Dress. Colon Branch, MD 04/17/11 [phone]

## 2011-08-17 ENCOUNTER — Emergency Department (HOSPITAL_COMMUNITY)
Admission: EM | Admit: 2011-08-17 | Discharge: 2011-08-17 | Disposition: A | Discharge status: Home / Self Care | Payer: Self-pay | Attending: Emergency Medicine | Admitting: Emergency Medicine

## 2011-08-17 ENCOUNTER — Encounter (HOSPITAL_COMMUNITY): Payer: Self-pay | Admitting: *Deleted

## 2011-08-17 DIAGNOSIS — H60399 Other infective otitis externa, unspecified ear: Secondary | ICD-10-CM | POA: Insufficient documentation

## 2011-08-17 DIAGNOSIS — H6090 Unspecified otitis externa, unspecified ear: Secondary | ICD-10-CM

## 2011-08-17 DIAGNOSIS — H9209 Otalgia, unspecified ear: Secondary | ICD-10-CM | POA: Insufficient documentation

## 2011-08-17 LAB — URINALYSIS, ROUTINE W REFLEX MICROSCOPIC
Bilirubin Urine: NEGATIVE
Specific Gravity, Urine: 1.02 (ref 1.005–1.030)
Urobilinogen, UA: 0.2 mg/dL (ref 0.0–1.0)
pH: 5.5 (ref 5.0–8.0)

## 2011-08-17 LAB — URINE MICROSCOPIC-ADD ON

## 2011-08-17 MED ORDER — NEOMYCIN-POLYMYXIN-HC 3.5-10000-1 OT SOLN
4.0000 [drp] | Freq: Once | OTIC | Status: AC
  Administered 2011-08-17: 4 [drp] via OTIC
  Filled 2011-08-17: qty 10

## 2011-08-17 MED ORDER — AMOXICILLIN 500 MG PO CAPS: 500.0000 mg | ORAL_CAPSULE | Freq: Three times a day (TID) | ORAL | Status: AC

## 2011-08-17 MED ORDER — HYDROCODONE-ACETAMINOPHEN 5-325 MG PO TABS: ORAL_TABLET | ORAL | Status: AC

## 2011-08-17 MED ORDER — HYDROCODONE-ACETAMINOPHEN 5-325 MG PO TABS
2.0000 | ORAL_TABLET | Freq: Once | ORAL | Status: AC
  Administered 2011-08-17: 2 via ORAL
  Filled 2011-08-17: qty 2

## 2011-08-17 NOTE — ED Notes (Signed)
Bil ear pain , onset today.

## 2011-08-17 NOTE — ED Notes (Signed)
Pt c/o bilateral ear pain that started today, pt also requesting urinary test for uti symptoms,

## 2011-08-22 NOTE — ED Provider Notes (Signed)
History     CSN: [id number]  Arrival date & time 08/17/11  1750   First MD Initiated Contact with Patient 08/17/11 1833      Chief Complaint  Patient presents with  . Otalgia  . Urinary Tract Infection    (Consider location/radiation/quality/duration/timing/severity/associated sxs/prior treatment) HPI Comments: Patient with hx of recurrent ear pain and frequent ear infections c/o pain to both ears for several hours.  States that she typically uses ear drops but has recently ran out.  She also reports mild frontal headache.  She denies vomiting, fever, dizziness or visual changes.  She also requests to have her urine tested for infection. States that she is having urinary frequency and is concerned she may have a UTI.    Patient is a 26 y.o. female presenting with ear pain. The history is provided by the patient.  Otalgia This is a recurrent problem. The current episode started 6 to 12 hours ago. There is pain in both ears. The problem occurs constantly. The problem has not changed since onset.There has been no fever. The pain is moderate. Associated symptoms include ear discharge and hearing loss. Pertinent negatives include no headaches, no rhinorrhea, no sore throat, no abdominal pain, no vomiting, no neck pain and no cough.    Past Medical History  Diagnosis Date  . Congenital obstruction of ureteropelvic junction (UPJ)     Past Surgical History  Procedure Date  . Nephrectomy     right side  . Nasal sinus surgery     History reviewed. No pertinent family history.  History  Substance Use Topics  . Smoking status: Never Smoker   . Smokeless tobacco: Not on file  . Alcohol Use: No    OB History    Grav Para Term Preterm Abortions TAB SAB Ect Mult Living                  Review of Systems  Constitutional: Negative for fever, chills, activity change and appetite change.  HENT: Positive for hearing loss, ear pain and ear discharge. Negative for congestion, sore throat,  facial swelling, rhinorrhea, trouble swallowing, neck pain and neck stiffness.   Eyes: Negative for visual disturbance.  Respiratory: Negative for cough.   Gastrointestinal: Negative for nausea, vomiting and abdominal pain.  Genitourinary: Positive for frequency. Negative for urgency, flank pain, decreased urine volume, vaginal bleeding, vaginal discharge, difficulty urinating and vaginal pain.  Neurological: Negative for dizziness, facial asymmetry, weakness, numbness and headaches.  All other systems reviewed and are negative.    Allergies  Cinnamon flavor; Codeine; Ibuprofen; and Levofloxacin  Home Medications   Current Outpatient Rx  Name Route Sig Dispense Refill  . AMOXICILLIN 500 MG PO CAPS Oral Take 1 capsule (500 mg total) by mouth 3 (three) times daily. For 10 days 30 capsule 0  . HYDROCODONE-ACETAMINOPHEN 5-325 MG PO TABS  Take one-two tabs po q 4-6 hrs prn pain 15 tablet 0  . LORATADINE 10 MG PO TABS Oral Take 10 mg by mouth daily.    Marland Kitchen OMEPRAZOLE 20 MG PO CPDR Oral Take 20 mg by mouth every evening.      BP 144/98  Pulse 75  Temp 97.9 F (36.6 C) (Oral)  Resp 20  Ht 5\' 9"  (1.753 m)  Wt 375 lb (170.099 kg)  BMI 55.38 kg/m2  SpO2 100%  LMP 08/01/2011  Physical Exam  Nursing note and vitals reviewed. Constitutional: She is oriented to person, place, and time. She appears well-developed and well-nourished. No  distress.       morbidly obese  HENT:  Head: Normocephalic and atraumatic. No trismus in the jaw.  Right Ear: Tympanic membrane and ear canal normal.  Left Ear: There is drainage and tenderness. No swelling. No mastoid tenderness. Tympanic membrane is erythematous. Tympanic membrane is not perforated and not bulging.  No middle ear effusion. No hemotympanum.  Nose: Mucosal edema and rhinorrhea present.  Mouth/Throat: Uvula is midline and mucous membranes are normal. No uvula swelling. Posterior oropharyngeal erythema present. No oropharyngeal exudate,  posterior oropharyngeal edema or tonsillar abscesses.  Neck: Normal range of motion and phonation normal. Neck supple. No Brudzinski's sign and no Kernig's sign noted.  Cardiovascular: Normal rate, regular rhythm, normal heart sounds and intact distal pulses.   No murmur heard. Pulmonary/Chest: Effort normal and breath sounds normal. She has no wheezes. She has no rales.  Musculoskeletal: She exhibits no edema.  Lymphadenopathy:    She has no cervical adenopathy.  Neurological: She is alert and oriented to person, place, and time. She exhibits normal muscle tone. Coordination normal.  Skin: Skin is warm and dry.    ED Course  Procedures (including critical care time)  Results for orders placed during the hospital encounter of 08/17/11  PREGNANCY, URINE      Component Value Range   Preg Test, Ur NEGATIVE  NEGATIVE  URINALYSIS, ROUTINE W REFLEX MICROSCOPIC      Component Value Range   Color, Urine YELLOW  YELLOW   APPearance CLEAR  CLEAR   Specific Gravity, Urine 1.020  1.005 - 1.030   pH 5.5  5.0 - 8.0   Glucose, UA NEGATIVE  NEGATIVE mg/dL   Hgb urine dipstick TRACE (*) NEGATIVE   Bilirubin Urine NEGATIVE  NEGATIVE   Ketones, ur NEGATIVE  NEGATIVE mg/dL   Protein, ur NEGATIVE  NEGATIVE mg/dL   Urobilinogen, UA 0.2  0.0 - 1.0 mg/dL   Nitrite NEGATIVE  NEGATIVE   Leukocytes, UA NEGATIVE  NEGATIVE  URINE MICROSCOPIC-ADD ON      Component Value Range   Squamous Epithelial / LPF RARE  RARE   RBC / HPF 0-2  <3 RBC/hpf   Bacteria, UA RARE  RARE      1. Otitis externa       MDM    Patient is alert, non-toxic appearing.  Vitals stable.  c spine NT, no meningeal signs.  No mastoid tenderness.  Mild erythema of the left TM as well as drainage.    Cortisporin otic dispensed for home use, norco #15, amoxil  Pt agrees to close follow-up with her PMD.    The patient appears reasonably screened and/or stabilized for discharge and I doubt any other medical condition or other [facility]  requiring further screening, evaluation, or treatment in the ED at this time prior to discharge.   Flornce Record L. [place], [place] 08/22/11 2247

## 2011-08-25 NOTE — ED Provider Notes (Signed)
Medical screening examination/treatment/procedure(s) were performed by non-physician practitioner and as supervising physician I was immediately available for consultation/collaboration.   Shelda Jakes, MD 08/25/11 1308

## 2011-10-18 ENCOUNTER — Emergency Department (HOSPITAL_COMMUNITY): Payer: Self-pay

## 2011-10-18 ENCOUNTER — Emergency Department (HOSPITAL_COMMUNITY)
Admission: EM | Admit: 2011-10-18 | Discharge: 2011-10-18 | Disposition: A | Discharge status: Home / Self Care | Payer: Self-pay | Attending: Emergency Medicine | Admitting: Emergency Medicine

## 2011-10-18 ENCOUNTER — Encounter (HOSPITAL_COMMUNITY): Payer: Self-pay

## 2011-10-18 DIAGNOSIS — R110 Nausea: Secondary | ICD-10-CM | POA: Insufficient documentation

## 2011-10-18 DIAGNOSIS — R209 Unspecified disturbances of skin sensation: Secondary | ICD-10-CM | POA: Insufficient documentation

## 2011-10-18 DIAGNOSIS — R109 Unspecified abdominal pain: Secondary | ICD-10-CM | POA: Insufficient documentation

## 2011-10-18 DIAGNOSIS — Z905 Acquired absence of kidney: Secondary | ICD-10-CM | POA: Insufficient documentation

## 2011-10-18 DIAGNOSIS — R350 Frequency of micturition: Secondary | ICD-10-CM | POA: Insufficient documentation

## 2011-10-18 DIAGNOSIS — Z87442 Personal history of urinary calculi: Secondary | ICD-10-CM | POA: Insufficient documentation

## 2011-10-18 LAB — CBC WITH DIFFERENTIAL/PLATELET
Lymphocytes Relative: 25 % (ref 12–46)
Lymphs Abs: 3.4 10*3/uL (ref 0.7–4.0)
Neutrophils Relative %: 63 % (ref 43–77)
Platelets: 283 10*3/uL (ref 150–400)
RBC: 4.74 MIL/uL (ref 3.87–5.11)
WBC: 13.7 10*3/uL — ABNORMAL HIGH (ref 4.0–10.5)

## 2011-10-18 LAB — COMPREHENSIVE METABOLIC PANEL
ALT: 24 U/L (ref 0–35)
Alkaline Phosphatase: 85 U/L (ref 39–117)
CO2: 26 mEq/L (ref 19–32)
GFR calc Af Amer: 82 mL/min — ABNORMAL LOW (ref >90)
Glucose, Bld: 73 mg/dL (ref 70–99)
Potassium: 4.1 mEq/L (ref 3.5–5.1)
Sodium: 136 mEq/L (ref 135–145)
Total Protein: 7.2 g/dL (ref 6.0–8.3)

## 2011-10-18 LAB — URINALYSIS, ROUTINE W REFLEX MICROSCOPIC
Nitrite: NEGATIVE
Specific Gravity, Urine: 1.015 (ref 1.005–1.030)
Urobilinogen, UA: 0.2 mg/dL (ref 0.0–1.0)

## 2011-10-18 MED ORDER — HYDROMORPHONE HCL PF 1 MG/ML IJ SOLN
1.0000 mg | Freq: Once | INTRAMUSCULAR | Status: AC
  Administered 2011-10-18: 1 mg via INTRAVENOUS
  Filled 2011-10-18: qty 1

## 2011-10-18 MED ORDER — ONDANSETRON HCL 4 MG/2ML IJ SOLN
4.0000 mg | Freq: Once | INTRAMUSCULAR | Status: AC
  Administered 2011-10-18: 4 mg via INTRAVENOUS
  Filled 2011-10-18: qty 2

## 2011-10-18 MED ORDER — OXYCODONE-ACETAMINOPHEN 5-325 MG PO TABS: 1.0000 | ORAL_TABLET | ORAL | Status: AC | PRN

## 2011-10-18 NOTE — ED Notes (Addendum)
Pain in left side. Started last night. Real hard to breathe per pt. Also urinating every 5 minutes per pt.

## 2011-10-18 NOTE — ED Notes (Addendum)
Pt's urine poc preg test was negative. Alice [name]

## 2011-10-18 NOTE — ED Provider Notes (Signed)
History  This chart was scribed for Joya Gaskins, MD by Bennett Scrape. This patient was seen in room APA03/APA03 and the patient's care was started at 8:16PM.  CSN: [id number]  Arrival date & time 10/18/11  1951   First MD Initiated Contact with Patient 10/18/11 2016      Chief Complaint  Patient presents with  . Flank Pain    Patient is a 26 y.o. female presenting with flank pain. The history is provided by the patient. No language interpreter was used.  Flank Pain This is a new problem. The current episode started 12 to 24 hours ago. The problem occurs constantly. The problem has been gradually worsening. Associated symptoms include abdominal pain. Pertinent negatives include no chest pain, no headaches and no shortness of breath. Nothing relieves the symptoms. She has tried nothing for the symptoms.    Alice [name] is a 25 y.o. female with a h/o kidney stones who presents to the Emergency Department complaining of 24 hours of sudden onset, gradually worsening, constant left flank pain described as pressure that radiates into the RUQ with associated nausea and frequency. The pain is worse with deep breathing. She denies taking OTC medications at home to improve symptoms. She reports that the pain is similar but more intense than previously experienced kidney stones. She also c/o numbness in bilateral feet for the past 2 days. She denies having prior episodes of similar symptoms. She denies fever, neck pain, sore throat, visual disturbance, CP, cough, SOB, emesis, diarrhea, HA, weakness, and rash as associated symptoms. She reports that her LNMP was 09/25/11. She denies smoking and alcohol use.   Past Medical History  Diagnosis Date  . Congenital obstruction of ureteropelvic junction (UPJ)   kidney stones per pt at bedside   Past Surgical History  Procedure Date  . Nephrectomy     right side  . Nasal sinus surgery   kidney stents and right ureter repair per pt at bedside    tonsillectomy per pt at bedside   History reviewed. No pertinent family history.  History  Substance Use Topics  . Smoking status: Never Smoker   . Smokeless tobacco: Not on file  . Alcohol Use: No     Review of Systems  Respiratory: Negative for shortness of breath.   Cardiovascular: Negative for chest pain.  Gastrointestinal: Positive for nausea and abdominal pain. Negative for vomiting and diarrhea.  Genitourinary: Positive for frequency and flank pain. Negative for hematuria and vaginal bleeding.  Neurological: Positive for numbness. Negative for headaches.  All other systems reviewed and are negative.    Allergies  Cinnamon flavor; Codeine; Ibuprofen; and Levofloxacin  Home Medications   Current Outpatient Rx  Name Route Sig Dispense Refill  . LORATADINE 10 MG PO TABS Oral Take 10 mg by mouth daily.    Marland Kitchen OMEPRAZOLE 20 MG PO CPDR Oral Take 20 mg by mouth every evening.      Triage Vitals: BP 135/79  Pulse 93  Temp 98 F (36.7 C) (Oral)  Resp 18  Ht 5\' 9"  (1.753 m)  Wt 370 lb (167.831 kg)  BMI 54.64 kg/m2  SpO2 99%  LMP 09/25/2011  Physical Exam  Nursing note and vitals reviewed.  CONSTITUTIONAL: Well developed/well nourished HEAD AND FACE: Normocephalic/atraumatic EYES: EOMI/PERRL ENMT: Mucous membranes moist NECK: supple no meningeal signs SPINE:entire spine nontender CV: S1/S2 noted, no murmurs/rubs/gallops noted LUNGS: Lungs are clear to auscultation bilaterally, no apparent distress ABDOMEN: soft, moderate tenderness in the LUQ, exam limited  by body habitus, no rebound or guarding GU: left cva tenderness NEURO: Pt is awake/alert, moves all extremitiesx4 EXTREMITIES: pulses normal, full ROM SKIN: warm, color normal PSYCH: no abnormalities of mood noted  ED Course  Procedures   DIAGNOSTIC STUDIES: Oxygen Saturation is 99% on room air, normal by my interpretation.    COORDINATION OF CARE: 8:26PM-Discussed treatment plan which includes Zofran  and Dilaudid with pt at bedside and pt agreed to plan. 9:30 PM Pt with continued pain, nausea, will obtain CT imaging.  Reports pain is "different" from previous flank pain.    11:10 PM CT shows no acute abnormality Pt reports long h/o kidney issue, reports congenital defect that caused the nephrectomy.  Reports she has "stones that don't show up" However she is not ill appearing at this time.  She is nontoxic and no other acute abnormality on CT Referred to local urologist   Labs Reviewed  URINALYSIS, ROUTINE W REFLEX MICROSCOPIC      MDM  Nursing notes including past medical history and social history reviewed and considered in documentation Labs/vital reviewed and considered       I personally performed the services described in this documentation, which was scribed in my presence. The recorded information has been reviewed and considered.       Joya Gaskins, MD 10/18/11 [phone]

## 2011-10-18 NOTE — ED Notes (Signed)
Discharge instructions reviewed with pt, questions answered. Pt verbalized understanding.  

## 2012-02-16 ENCOUNTER — Encounter (HOSPITAL_COMMUNITY): Payer: Self-pay | Admitting: *Deleted

## 2012-02-16 ENCOUNTER — Emergency Department (HOSPITAL_COMMUNITY)
Admission: EM | Admit: 2012-02-16 | Discharge: 2012-02-17 | Disposition: A | Discharge status: Home / Self Care | Payer: Medicaid Other | Attending: Emergency Medicine | Admitting: Emergency Medicine

## 2012-02-16 DIAGNOSIS — IMO0001 Reserved for inherently not codable concepts without codable children: Secondary | ICD-10-CM | POA: Insufficient documentation

## 2012-02-16 DIAGNOSIS — R109 Unspecified abdominal pain: Secondary | ICD-10-CM | POA: Insufficient documentation

## 2012-02-16 DIAGNOSIS — R0601 Orthopnea: Secondary | ICD-10-CM | POA: Insufficient documentation

## 2012-02-16 DIAGNOSIS — R112 Nausea with vomiting, unspecified: Secondary | ICD-10-CM | POA: Insufficient documentation

## 2012-02-16 DIAGNOSIS — Q6239 Other obstructive defects of renal pelvis and ureter: Secondary | ICD-10-CM | POA: Insufficient documentation

## 2012-02-16 DIAGNOSIS — E860 Dehydration: Secondary | ICD-10-CM | POA: Insufficient documentation

## 2012-02-16 DIAGNOSIS — Z6841 Body Mass Index (BMI) 40.0 and over, adult: Secondary | ICD-10-CM | POA: Insufficient documentation

## 2012-02-16 DIAGNOSIS — R197 Diarrhea, unspecified: Secondary | ICD-10-CM | POA: Insufficient documentation

## 2012-02-16 DIAGNOSIS — R21 Rash and other nonspecific skin eruption: Secondary | ICD-10-CM | POA: Insufficient documentation

## 2012-02-16 DIAGNOSIS — Z905 Acquired absence of kidney: Secondary | ICD-10-CM | POA: Insufficient documentation

## 2012-02-16 DIAGNOSIS — J3489 Other specified disorders of nose and nasal sinuses: Secondary | ICD-10-CM | POA: Insufficient documentation

## 2012-02-16 DIAGNOSIS — R42 Dizziness and giddiness: Secondary | ICD-10-CM | POA: Insufficient documentation

## 2012-02-16 DIAGNOSIS — Z79899 Other long term (current) drug therapy: Secondary | ICD-10-CM | POA: Insufficient documentation

## 2012-02-16 LAB — CBC WITH DIFFERENTIAL/PLATELET
Eosinophils Absolute: 0.2 10*3/uL (ref 0.0–0.7)
Eosinophils Relative: 3 % (ref 0–5)
HCT: 42.9 % (ref 36.0–46.0)
Lymphs Abs: 1.3 10*3/uL (ref 0.7–4.0)
MCH: 30.1 pg (ref 26.0–34.0)
MCV: 87.7 fL (ref 78.0–100.0)
Monocytes Absolute: 0.7 10*3/uL (ref 0.1–1.0)
Monocytes Relative: 9 % (ref 3–12)
Platelets: 220 10*3/uL (ref 150–400)
RBC: 4.89 MIL/uL (ref 3.87–5.11)

## 2012-02-16 LAB — LIPASE, BLOOD: Lipase: 21 U/L (ref 11–59)

## 2012-02-16 LAB — COMPREHENSIVE METABOLIC PANEL
BUN: 15 mg/dL (ref 6–23)
Calcium: 8.7 mg/dL (ref 8.4–10.5)
Creatinine, Ser: 0.9 mg/dL (ref 0.50–1.10)
GFR calc Af Amer: >90 mL/min (ref >90)
GFR calc non Af Amer: 87 mL/min — ABNORMAL LOW (ref >90)
Glucose, Bld: 89 mg/dL (ref 70–99)
Sodium: 136 mEq/L (ref 135–145)
Total Protein: 7.2 g/dL (ref 6.0–8.3)

## 2012-02-16 MED ORDER — ONDANSETRON 8 MG PO TBDP: 8.0000 mg | ORAL_TABLET | Freq: Three times a day (TID) | ORAL | Status: DC | PRN

## 2012-02-16 MED ORDER — SODIUM CHLORIDE 0.9 % IV SOLN
1000.0000 mL | Freq: Once | INTRAVENOUS | Status: AC
  Administered 2012-02-16: 1000 mL via INTRAVENOUS

## 2012-02-16 MED ORDER — DIPHENHYDRAMINE HCL 50 MG/ML IJ SOLN
50.0000 mg | Freq: Once | INTRAMUSCULAR | Status: AC
  Administered 2012-02-16: 50 mg via INTRAVENOUS
  Filled 2012-02-16: qty 1

## 2012-02-16 MED ORDER — LORAZEPAM 2 MG/ML IJ SOLN
1.0000 mg | Freq: Once | INTRAMUSCULAR | Status: AC
  Administered 2012-02-16: 1 mg via INTRAVENOUS
  Filled 2012-02-16: qty 1

## 2012-02-16 MED ORDER — PROMETHAZINE HCL 25 MG/ML IJ SOLN
25.0000 mg | Freq: Once | INTRAMUSCULAR | Status: AC
  Administered 2012-02-16: 25 mg via INTRAVENOUS
  Filled 2012-02-16: qty 1

## 2012-02-16 MED ORDER — ONDANSETRON HCL 4 MG/2ML IJ SOLN
4.0000 mg | Freq: Once | INTRAMUSCULAR | Status: AC
  Administered 2012-02-16: 4 mg via INTRAVENOUS
  Filled 2012-02-16: qty 2

## 2012-02-16 MED ORDER — DIPHENHYDRAMINE HCL 50 MG/ML IJ SOLN: 50.0000 mg | Freq: Once | INTRAMUSCULAR | Status: DC

## 2012-02-16 MED ORDER — SODIUM CHLORIDE 0.9 % IV SOLN: 1000.0000 mL | INTRAVENOUS | Status: DC

## 2012-02-16 NOTE — ED Notes (Signed)
NVD, onset last night

## 2012-02-16 NOTE — ED Provider Notes (Signed)
History  This chart was scribed for Ward Givens, MD by Bennett Scrape, ED Scribe. This patient was seen in room APA05/APA05 and the patient's care was started at 8:41 PM.  CSN: [id number]  Arrival date & time 02/16/12  1939   First MD Initiated Contact with Patient 02/16/12 2041      Chief Complaint  Patient presents with  . Emesis     Patient is a 27 y.o. female presenting with vomiting. The history is provided by the patient. No language interpreter was used.  Emesis  This is a new problem. The current episode started 12 to 24 hours ago. The problem occurs 5 to 10 times per day. The problem has not changed since onset.The emesis has an appearance of stomach contents. There has been no fever. Associated symptoms include abdominal pain, cough, diarrhea and myalgias. Pertinent negatives include no chills and no fever. Risk factors include ill contacts.    Alice [name] is a 28 y.o. female who presents to the Emergency Department complaining of 10+ episodes of non-bloody emesis with associated LUQ abdominal pain, myalgias, nausea, non-bloody watery diarrhea at least 10 times and weakness with standing. She also reports 2 days of a cough productive of clear sputum and clear rhinorrhea. She states that she has a sick contact (her husband) with GI symptoms recently  he reports today is the first day he could eat again). She also c/o two small circular rashes on her right forearm that she noticed two weeks ago. She reports that she used anti-fungal cream for one week with no improvement. She denies sore throat, CP, and SOB as associated symptoms.  Pt advised to use the antifungal cream for at least 4-6 weeks.   She has a h/o UPJ and denies smoking and alcohol use.  PCP is with [facility].  Past Medical History  Diagnosis Date  . Congenital obstruction of ureteropelvic junction (UPJ)     Past Surgical History  Procedure Date  . Nephrectomy     right side  . Nasal  sinus surgery     History reviewed. No pertinent family history.  History  Substance Use Topics  . Smoking status: Never Smoker   . Smokeless tobacco: Not on file  . Alcohol Use: No  employed Lives at home Lives with spouse  No OB history provided.  Review of Systems  Constitutional: Negative for fever and chills.  HENT: Positive for rhinorrhea. Negative for sore throat.   Respiratory: Positive for cough. Negative for shortness of breath.   Cardiovascular: Negative for chest pain.  Gastrointestinal: Positive for nausea, vomiting, abdominal pain and diarrhea.  Musculoskeletal: Positive for myalgias.  Skin: Positive for rash.  All other systems reviewed and are negative.    Allergies  Cinnamon flavor; Codeine; Ibuprofen; Levofloxacin; and Reglan  Home Medications   Current Outpatient Rx  Name  Route  Sig  Dispense  Refill  . NEOMYCIN-COLIST-HC-THONZONIUM 3.04-16-08-0.5 MG/ML OT SUSP   Both Ears   Place 3 drops into both ears as needed. For pain/sypmtoms associated with Chronic swimmers ear         . RANITIDINE HCL 75 MG PO TABS   Oral   Take 75 mg by mouth daily as needed. For acid reflux           Triage Vitals: BP 168/72  Pulse 140  Temp 97.7 F (36.5 C) (Oral)  Resp 24  Ht 5\' 9"  (1.753 m)  Wt 370 lb (167.831 kg)  BMI 54.64  kg/m2  SpO2 99%  LMP 02/12/2012  Vital signs normal    Physical Exam  Nursing note and vitals reviewed. Constitutional: She is oriented to person, place, and time. She appears well-developed and well-nourished.  Non-toxic appearance. She does not appear ill. No distress.       Morbidly obese  HENT:  Head: Normocephalic and atraumatic.  Right Ear: External ear normal.  Left Ear: External ear normal.  Nose: Nose normal. No mucosal edema or rhinorrhea.  Mouth/Throat: Mucous membranes are dry. No dental abscesses or uvula swelling.       Tongue appears dry  Eyes: Conjunctivae normal and EOM are normal. Pupils are equal, round,  and reactive to light.  Neck: Normal range of motion and full passive range of motion without pain. Neck supple.  Cardiovascular: Normal rate, regular rhythm and normal heart sounds.  Exam reveals no gallop and no friction rub.   No murmur heard. Pulmonary/Chest: Effort normal and breath sounds normal. No respiratory distress. She has no wheezes. She has no rhonchi. She has no rales. She exhibits no tenderness and no crepitus.  Abdominal: Soft. Normal appearance and bowel sounds are normal. She exhibits no distension. There is tenderness (diffusely, more so in the LUQ and RUQ). There is no rebound and no guarding.  Musculoskeletal: Normal range of motion. She exhibits no edema and no tenderness.       Moves all extremities well.   Neurological: She is alert and oriented to person, place, and time. She has normal strength. No cranial nerve deficit.  Skin: Skin is warm, dry and intact. Rash noted. No erythema. No pallor.       2 circular areas quarter sized on the left forearm   Psychiatric: She has a normal mood and affect. Her speech is normal and behavior is normal. Her mood appears not anxious.    ED Course  Procedures (including critical care time)   Medications  0.9 %  sodium chloride infusion (0 mL Intravenous Stopped 02/16/12 2330)    Followed by  0.9 %  sodium chloride infusion (1000 mL Intravenous New Bag/Given 02/16/12 2330)    Followed by  0.9 %  sodium chloride infusion (not administered)  ondansetron (ZOFRAN ODT) 8 MG disintegrating tablet (not administered)  promethazine (PHENERGAN) injection 25 mg (25 mg Intravenous Given 02/16/12 2205)  diphenhydrAMINE (BENADRYL) injection 50 mg (50 mg Intravenous Given 02/16/12 2205)  LORazepam (ATIVAN) injection 1 mg (1 mg Intravenous Given 02/16/12 2315)  ondansetron (ZOFRAN) injection 4 mg (4 mg Intravenous Given 02/16/12 2313)     DIAGNOSTIC STUDIES: Oxygen Saturation is 99% on room air, normal by my interpretation.    COORDINATION OF  CARE:  9:51 PM- Advised pt that she will have two treat the areas on her arm with antifungal cream for one month. Discussed treatment plan which includes CBC and antiemetics with pt at bedside and pt agreed to plan.  11:02 PM-Pt rechecked and reports that her nausea has improved but states that she now has a bad muscle twitches after getting the phenergan and benadryl.  Will try ativan and zofran.   23:55 nausea "alittle better". Still c/o jerking, most likely from benadryl. Has not improved with ativan so advised it will have to wear off. Pt ready to try oral fluids.   Dr Adriana Simas will discharge when able to tolerate oral fluids.   Results for orders placed during the hospital encounter of 02/16/12  CBC WITH DIFFERENTIAL      Component Value Range  WBC 8.2  4.0 - 10.5 K/uL   RBC 4.89  3.87 - 5.11 MIL/uL   Hemoglobin 14.7  12.0 - 15.0 g/dL   HCT 16.1  09.6 - 04.5 %   MCV 87.7  78.0 - 100.0 fL   MCH 30.1  26.0 - 34.0 pg   MCHC 34.3  30.0 - 36.0 g/dL   RDW 40.9  81.1 - 91.4 %   Platelets 220  150 - 400 K/uL   Neutrophils Relative 72  43 - 77 %   Neutro Abs 5.9  1.7 - 7.7 K/uL   Lymphocytes Relative 16  12 - 46 %   Lymphs Abs 1.3  0.7 - 4.0 K/uL   Monocytes Relative 9  3 - 12 %   Monocytes Absolute 0.7  0.1 - 1.0 K/uL   Eosinophils Relative 3  0 - 5 %   Eosinophils Absolute 0.2  0.0 - 0.7 K/uL   Basophils Relative 0  0 - 1 %   Basophils Absolute 0.0  0.0 - 0.1 K/uL  COMPREHENSIVE METABOLIC PANEL      Component Value Range   Sodium 136  135 - 145 mEq/L   Potassium 3.5  3.5 - 5.1 mEq/L   Chloride 101  96 - 112 mEq/L   CO2 26  19 - 32 mEq/L   Glucose, Bld 89  70 - 99 mg/dL   BUN 15  6 - 23 mg/dL   Creatinine, Ser 7.82  0.50 - 1.10 mg/dL   Calcium 8.7  8.4 - 95.6 mg/dL   Total Protein 7.2  6.0 - 8.3 g/dL   Albumin 3.9  3.5 - 5.2 g/dL   AST 24  0 - 37 U/L   ALT 33  0 - 35 U/L   Alkaline Phosphatase 68  39 - 117 U/L   Total Bilirubin 0.9  0.3 - 1.2 mg/dL   GFR calc non Af Amer 87  (*) >90 mL/min   GFR calc Af Amer >90  >90 mL/min  LIPASE, BLOOD      Component Value Range   Lipase 21  11 - 59 U/L   Laboratory interpretation all normal    1. Nausea vomiting and diarrhea   2. Dehydration     New Prescriptions   ONDANSETRON (ZOFRAN ODT) 8 MG DISINTEGRATING TABLET    Take 1 tablet (8 mg total) by mouth every 8 (eight) hours as needed for nausea.    Plan discharge  Devoria Albe, MD, FACEP   MDM    I personally performed the services described in this documentation, which was scribed in my presence. The recorded information has been reviewed and considered.  Devoria Albe, MD, Armando Gang    Ward Givens, MD 02/17/12 [phone]

## 2012-02-16 NOTE — ED Notes (Signed)
MD at bedside to evaluate patient.

## 2012-02-16 NOTE — ED Notes (Signed)
Patient's IV stopped. Flushed well with 10 ml Normal Saline.

## 2012-07-18 ENCOUNTER — Other Ambulatory Visit: Payer: Self-pay | Admitting: Obstetrics & Gynecology

## 2012-07-18 DIAGNOSIS — O3680X0 Pregnancy with inconclusive fetal viability, not applicable or unspecified: Secondary | ICD-10-CM

## 2012-07-23 ENCOUNTER — Encounter: Payer: Self-pay | Admitting: Women's Health

## 2012-07-23 ENCOUNTER — Encounter: Payer: Self-pay | Admitting: *Deleted

## 2012-07-23 ENCOUNTER — Ambulatory Visit (INDEPENDENT_AMBULATORY_CARE_PROVIDER_SITE_OTHER): Payer: Medicaid Other

## 2012-07-23 ENCOUNTER — Ambulatory Visit (INDEPENDENT_AMBULATORY_CARE_PROVIDER_SITE_OTHER): Payer: Medicaid Other | Admitting: Women's Health

## 2012-07-23 ENCOUNTER — Other Ambulatory Visit (HOSPITAL_COMMUNITY)
Admission: RE | Admit: 2012-07-23 | Discharge: 2012-07-23 | Disposition: A | Discharge status: Home / Self Care | Payer: Medicaid Other | Source: Ambulatory Visit | Attending: Obstetrics & Gynecology | Admitting: Obstetrics & Gynecology

## 2012-07-23 VITALS — BP 120/80 | Wt 359.6 lb

## 2012-07-23 DIAGNOSIS — O3680X0 Pregnancy with inconclusive fetal viability, not applicable or unspecified: Secondary | ICD-10-CM

## 2012-07-23 DIAGNOSIS — O30009 Twin pregnancy, unspecified number of placenta and unspecified number of amniotic sacs, unspecified trimester: Secondary | ICD-10-CM | POA: Insufficient documentation

## 2012-07-23 DIAGNOSIS — Z1389 Encounter for screening for other disorder: Secondary | ICD-10-CM

## 2012-07-23 DIAGNOSIS — O30049 Twin pregnancy, dichorionic/diamniotic, unspecified trimester: Secondary | ICD-10-CM

## 2012-07-23 DIAGNOSIS — O2341 Unspecified infection of urinary tract in pregnancy, first trimester: Secondary | ICD-10-CM

## 2012-07-23 DIAGNOSIS — E669 Obesity, unspecified: Secondary | ICD-10-CM

## 2012-07-23 DIAGNOSIS — O0990 Supervision of high risk pregnancy, unspecified, unspecified trimester: Secondary | ICD-10-CM | POA: Insufficient documentation

## 2012-07-23 DIAGNOSIS — O0991 Supervision of high risk pregnancy, unspecified, first trimester: Secondary | ICD-10-CM

## 2012-07-23 DIAGNOSIS — Z01419 Encounter for gynecological examination (general) (routine) without abnormal findings: Secondary | ICD-10-CM | POA: Insufficient documentation

## 2012-07-23 DIAGNOSIS — Z87440 Personal history of urinary (tract) infections: Secondary | ICD-10-CM

## 2012-07-23 DIAGNOSIS — Z905 Acquired absence of kidney: Secondary | ICD-10-CM

## 2012-07-23 DIAGNOSIS — Z113 Encounter for screening for infections with a predominantly sexual mode of transmission: Secondary | ICD-10-CM | POA: Insufficient documentation

## 2012-07-23 DIAGNOSIS — O219 Vomiting of pregnancy, unspecified: Secondary | ICD-10-CM

## 2012-07-23 DIAGNOSIS — O09219 Supervision of pregnancy with history of pre-term labor, unspecified trimester: Secondary | ICD-10-CM

## 2012-07-23 DIAGNOSIS — Z331 Pregnant state, incidental: Secondary | ICD-10-CM

## 2012-07-23 DIAGNOSIS — Z9889 Other specified postprocedural states: Secondary | ICD-10-CM | POA: Insufficient documentation

## 2012-07-23 DIAGNOSIS — O09899 Supervision of other high risk pregnancies, unspecified trimester: Secondary | ICD-10-CM | POA: Insufficient documentation

## 2012-07-23 LAB — POCT URINALYSIS DIPSTICK
Blood, UA: NEGATIVE
Glucose, UA: NEGATIVE
Ketones, UA: NEGATIVE

## 2012-07-23 MED ORDER — CEPHALEXIN 500 MG PO CAPS: 500.0000 mg | ORAL_CAPSULE | Freq: Four times a day (QID) | ORAL | Status: DC

## 2012-07-23 MED ORDER — DOXYLAMINE-PYRIDOXINE 10-10 MG PO TBEC: 10.0000 mg | DELAYED_RELEASE_TABLET | ORAL | Status: DC

## 2012-07-23 MED ORDER — NITROFURANTOIN MONOHYD MACRO 100 MG PO CAPS: 100.0000 mg | ORAL_CAPSULE | Freq: Every day | ORAL | Status: DC

## 2012-07-23 NOTE — Progress Notes (Signed)
Stomach cramps. New OB packet given. Consents signed.

## 2012-07-23 NOTE — Progress Notes (Signed)
Addendum; twins are di/di , Two separate sacs, and two ys.

## 2012-07-23 NOTE — Patient Instructions (Addendum)
Nausea & Vomiting  Have saltine crackers or pretzels by your bed and eat a few bites before you raise your head out of bed in the morning  Eat small frequent meals throughout the day instead of large meals  Drink plenty of fluids throughout the day to stay hydrated, just don't drink a lot of fluids with your meals.  This can make your stomach fill up faster making you feel sick  Do not brush your teeth right after you eat  Products with real ginger are good for nausea, like ginger ale and ginger hard candy Make sure it says made with real ginger!  Sucking on sour candy like lemon heads is also good for nausea  If your prenatal vitamins make you nauseated, take them at night so you will sleep through the nausea  If you feel like you need medicine for the nausea & vomiting please let us know  If you are unable to keep any fluids or food down please let us know    Pregnancy - First Trimester During sexual intercourse, millions of sperm go into the vagina. Only 1 sperm will penetrate and fertilize the female egg while it is in the Fallopian tube. One week later, the fertilized egg implants into the wall of the uterus. An embryo begins to develop into a baby. At 6 to 8 weeks, the eyes and face are formed and the heartbeat can be seen on ultrasound. At the end of 12 weeks (first trimester), all the baby's organs are formed. Now that you are pregnant, you will want to do everything you can to have a healthy baby. Two of the most important things are to get good prenatal care and follow your caregiver's instructions. Prenatal care is all the medical care you receive before the baby's birth. It is given to prevent, find, and treat problems during the pregnancy and childbirth. PRENATAL EXAMS  During prenatal visits, your weight, blood pressure, and urine are checked. This is done to make sure you are healthy and progressing normally during the pregnancy.  A pregnant woman should gain 25 to 35 pounds  during the pregnancy. However, if you are overweight or underweight, your caregiver will advise you regarding your weight.  Your caregiver will ask and answer questions for you.  Blood work, cervical cultures, other necessary tests, and a Pap test are done during your prenatal exams. These tests are done to check on your health and the probable health of your baby. Tests are strongly recommended and done for HIV with your permission. This is the virus that causes AIDS. These tests are done because medicines can be given to help prevent your baby from being born with this infection should you have been infected without knowing it. Blood work is also used to find out your blood type, previous infections, and follow your blood levels (hemoglobin).  Low hemoglobin (anemia) is common during pregnancy. Iron and vitamins are given to help prevent this. Later in the pregnancy, blood tests for diabetes will be done along with any other tests if any problems develop.  You may need other tests to make sure you and the baby are doing well. CHANGES DURING THE FIRST TRIMESTER  Your body goes through many changes during pregnancy. They vary from person to person. Talk to your caregiver about changes you notice and are concerned about. Changes can include:  Your menstrual period stops.  The egg and sperm carry the genes that determine what you look like. Genes from you   and your partner are forming a baby. The female genes determine whether the baby is a boy or a girl.  Your body increases in girth and you may feel bloated.  Feeling sick to your stomach (nauseous) and throwing up (vomiting). If the vomiting is uncontrollable, call your caregiver.  Your breasts will begin to enlarge and become tender.  Your nipples may stick out more and become darker.  The need to urinate more. Painful urination may mean you have a bladder infection.  Tiring easily.  Loss of appetite.  Cravings for certain kinds of  food.  At first, you may gain or lose a couple of pounds.  You may have changes in your emotions from day to day (excited to be pregnant or concerned something may go wrong with the pregnancy and baby).  You may have more vivid and strange dreams. HOME CARE INSTRUCTIONS   It is very important to avoid all smoking, alcohol and non-prescribed drugs during your pregnancy. These affect the formation and growth of the baby. Avoid chemicals while pregnant to ensure the delivery of a healthy infant.  Start your prenatal visits by the 12th week of pregnancy. They are usually scheduled monthly at first, then more often in the last 2 months before delivery. Keep your caregiver's appointments. Follow your caregiver's instructions regarding medicine use, blood and lab tests, exercise, and diet.  During pregnancy, you are providing food for you and your baby. Eat regular, well-balanced meals. Choose foods such as meat, fish, milk and other low fat dairy products, vegetables, fruits, and whole-grain breads and cereals. Your caregiver will tell you of the ideal weight gain.  You can help morning sickness by keeping soda crackers at the bedside. Eat a couple before arising in the morning. You may want to use the crackers without salt on them.  Eating 4 to 5 small meals rather than 3 large meals a day also may help the nausea and vomiting.  Drinking liquids between meals instead of during meals also seems to help nausea and vomiting.  A physical sexual relationship may be continued throughout pregnancy if there are no other problems. Problems may be early (premature) leaking of amniotic fluid from the membranes, vaginal bleeding, or belly (abdominal) pain.  Exercise regularly if there are no restrictions. Check with your caregiver or physical therapist if you are unsure of the safety of some of your exercises. Greater weight gain will occur in the last 2 trimesters of pregnancy. Exercising will  help:  Control your weight.  Keep you in shape.  Prepare you for labor and delivery.  Help you lose your pregnancy weight after you deliver your baby.  Wear a good support or jogging bra for breast tenderness during pregnancy. This may help if worn during sleep too.  Ask when prenatal classes are available. Begin classes when they are offered.  Do not use hot tubs, steam rooms, or saunas.  Wear your seat belt when driving. This protects you and your baby if you are in an accident.  Avoid raw meat, uncooked cheese, cat litter boxes, and soil used by cats throughout the pregnancy. These carry germs that can cause birth defects in the baby.  The first trimester is a good time to visit your dentist for your dental health. Getting your teeth cleaned is okay. Use a softer toothbrush and brush gently during pregnancy.  Ask for help if you have financial, counseling, or nutritional needs during pregnancy. Your caregiver will be able to offer counseling for   these needs as well as refer you for other special needs.  Do not take any medicines or herbs unless told by your caregiver.  Inform your caregiver if there is any mental or physical domestic violence.  Make a list of emergency phone numbers of family, friends, hospital, and police and fire departments.  Write down your questions. Take them to your prenatal visit.  Do not douche.  Do not cross your legs.  If you have to stand for long periods of time, rotate you feet or take small steps in a circle.  You may have more vaginal secretions that may require a sanitary pad. Do not use tampons or scented sanitary pads. MEDICINES AND DRUG USE IN PREGNANCY  Take prenatal vitamins as directed. The vitamin should contain 1 milligram of folic acid. Keep all vitamins out of reach of children. Only a couple vitamins or tablets containing iron may be fatal to a baby or young child when ingested.  Avoid use of all medicines, including herbs,  over-the-counter medicines, not prescribed or suggested by your caregiver. Only take over-the-counter or prescription medicines for pain, discomfort, or fever as directed by your caregiver. Do not use aspirin, ibuprofen, or naproxen unless directed by your caregiver.  Let your caregiver also know about herbs you may be using.  Alcohol is related to a number of birth defects. This includes fetal alcohol syndrome. All alcohol, in any form, should be avoided completely. Smoking will cause low birth rate and premature babies.  Street or illegal drugs are very harmful to the baby. They are absolutely forbidden. A baby born to an addicted mother will be addicted at birth. The baby will go through the same withdrawal an adult does.  Let your caregiver know about any medicines that you have to take and for what reason you take them. SEEK MEDICAL CARE IF:  You have any concerns or worries during your pregnancy. It is better to call with your questions if you feel they cannot wait, rather than worry about them. SEEK IMMEDIATE MEDICAL CARE IF:   An unexplained oral temperature above 102 F (38.9 C) develops, or as your caregiver suggests.  You have leaking of fluid from the vagina (birth canal). If leaking membranes are suspected, take your temperature and inform your caregiver of this when you call.  There is vaginal spotting or bleeding. Notify your caregiver of the amount and how many pads are used.  You develop a bad smelling vaginal discharge with a change in the color.  You continue to feel sick to your stomach (nauseated) and have no relief from remedies suggested. You vomit blood or coffee ground-like materials.  You lose more than 2 pounds of weight in 1 week.  You gain more than 2 pounds of weight in 1 week and you notice swelling of your face, hands, feet, or legs.  You gain 5 pounds or more in 1 week (even if you do not have swelling of your hands, face, legs, or feet).  You get  exposed to German measles and have never had them.  You are exposed to fifth disease or chickenpox.  You develop belly (abdominal) pain. Round ligament discomfort is a common non-cancerous (benign) cause of abdominal pain in pregnancy. Your caregiver still must evaluate this.  You develop headache, fever, diarrhea, pain with urination, or shortness of breath.  You fall or are in a car accident or have any kind of trauma.  There is mental or physical violence in your home. Document   Released: 01/25/2001 Document Revised: 10/26/2011 Document Reviewed: 07/29/2008 [employer] Patient Information 2014 [employer].  

## 2012-07-23 NOTE — Progress Notes (Signed)
Subjective:    Alice [name] is a 27 y.o. G32P0101 Caucasian female at [redacted]w[redacted]d by LMP which correlates well w/ today's u/s, being seen today for her first obstetrical visit.  Her obstetrical history is significant for morbid obesity, h/o spontaneous PTB @ 36.5wks, uncomplicated SVD- states ballard score determined baby to be term. States d/t her h/o renal congenital abnormality necessitating Rt nephrectomy, she was sent to [facility]FM last pregnancy for serial u/s's to assess fetal kidneys/AFI- no problems identified. Pregnancy history fully reviewed. Patient reports nausea, no vomiting- requests antiemetic. Denies cramping, lof, or vb.   Filed Vitals:   07/23/12 1551  BP: 120/80  Weight: 359 lb 9.6 oz (163.113 kg)    HISTORY: OB History   Grav Para Term Preterm Abortions TAB SAB Ect Mult Living   2 1  1      1      # Outc Date GA Lbr Len/2nd Wgt Sex Del Anes PTL Lv   1 PRE 2012 [redacted]w[redacted]d  6lb7oz(2.92kg) M SVD EPI  Yes   2 CUR              Past Medical History  Diagnosis Date  . Congenital obstruction of ureteropelvic junction (UPJ)   . History of kidney stones   . 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 History of recurrent UTIs    Past Surgical History  Procedure Laterality Date  . Nephrectomy      right side  . Nasal sinus surgery    . Tonsillectomy     Family History  Problem Relation Age of Onset  . Hypertension Mother   . Heart disease Father     MI  . Diabetes Other   . Hyperlipidemia Other   . Alcohol abuse Other      Exam    Pelvic Exam:    Perineum: Normal Perineum   Vulva: normal   Vagina:  normal mucosa, normal discharge, no palpable nodules   Uterus   normal size/shape for GA     Cervix: normal   Adnexa: Not palpable   Urinary: urethral meatus normal    System:     Skin: normal coloration and turgor, no rashes    Neurologic: oriented, normal mood   Extremities: normal strength, tone, and muscle mass   HEENT PERRLA   Mouth/Teeth  mucous membranes moist   Cardiovascular: regular rate and rhythm   Respiratory:  appears well, vitals normal, no respiratory distress, acyanotic, normal RR   Abdomen: soft, non-tender    Thin prep pap smear obtained   U/S today reveals dichorionic-diamniotic twins, +FCA x 2   Assessment:    Pregnancy: G2P0101 Patient Active Problem List   Diagnosis Date Noted  . Supervision of high-risk pregnancy 07/23/2012    Priority: High  . Twin gestation, dichorionic diamniotic 07/23/2012    Priority: High  . 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 History of recurrent UTI (urinary tract infection) 07/23/2012      [redacted]w[redacted]d G2P0101  New OB visit Pregravid BMI 54.6 Di-Di Twin gestation H/O Rt nephrectomy d/t congenital abnormality H/O recurrent UTIs UTI today H/O spontaneous PTB @ 36.5wks Nausea of pregnancy     Plan:   Initial labs drawn Continue prenatal vitamins Problem list reviewed and updated Reviewed n/v relief measures and warning s/s to report Rx Diclegis, 2 samples given, and prior auth faxed Rx Keflex 500mg  QID x 10d for UTI tx and Macrobid 100mg  q hs for remainder of pregnancy 17P not indicated  d/t twin gestation Genetic Screening discussed Integrated Screen: requested Cystic fibrosis screening discussed requested Ultrasound discussed; fetal survey: requested Follow up in 5 weeks for 1st IT/NT and visit Needs early gtt   Marge Duncans 07/23/2012 4:52 PM

## 2012-07-24 LAB — CBC
HCT: 41.3 % (ref 36.0–46.0)
Hemoglobin: 14.1 g/dL (ref 12.0–15.0)
MCH: 29.1 pg (ref 26.0–34.0)
MCHC: 34.1 g/dL (ref 30.0–36.0)
MCV: 85.3 fL (ref 78.0–100.0)
RBC: 4.84 MIL/uL (ref 3.87–5.11)

## 2012-07-24 LAB — URINALYSIS
Bilirubin Urine: NEGATIVE
Glucose, UA: NEGATIVE mg/dL
pH: 7 (ref 5.0–8.0)

## 2012-07-24 LAB — DRUG SCREEN, URINE, NO CONFIRMATION
Amphetamine Screen, Ur: NEGATIVE
Benzodiazepines.: NEGATIVE
Cocaine Metabolites: NEGATIVE
Marijuana Metabolite: NEGATIVE
Phencyclidine (PCP): NEGATIVE
Propoxyphene: NEGATIVE

## 2012-07-24 LAB — HEPATITIS B SURFACE ANTIGEN: Hepatitis B Surface Ag: NEGATIVE

## 2012-07-24 LAB — HIV ANTIBODY (ROUTINE TESTING W REFLEX): HIV: NONREACTIVE

## 2012-07-24 LAB — ABO AND RH

## 2012-07-25 LAB — URINE CULTURE
Colony Count: NO GROWTH
Organism ID, Bacteria: NO GROWTH

## 2012-07-25 LAB — VARICELLA ZOSTER ANTIBODY, IGG: Varicella IgG: 1870 Index — ABNORMAL HIGH (ref <135.00)

## 2012-07-28 ENCOUNTER — Encounter: Payer: Self-pay | Admitting: Women's Health

## 2012-08-07 ENCOUNTER — Ambulatory Visit: Payer: Medicaid Other

## 2012-08-07 ENCOUNTER — Other Ambulatory Visit: Payer: Self-pay | Admitting: Obstetrics & Gynecology

## 2012-08-07 ENCOUNTER — Encounter: Payer: Self-pay | Admitting: Obstetrics & Gynecology

## 2012-08-07 ENCOUNTER — Ambulatory Visit (INDEPENDENT_AMBULATORY_CARE_PROVIDER_SITE_OTHER): Payer: Medicaid Other | Admitting: Obstetrics & Gynecology

## 2012-08-07 VITALS — BP 120/80 | Wt 352.0 lb

## 2012-08-07 DIAGNOSIS — O30009 Twin pregnancy, unspecified number of placenta and unspecified number of amniotic sacs, unspecified trimester: Secondary | ICD-10-CM

## 2012-08-07 DIAGNOSIS — O200 Threatened abortion: Secondary | ICD-10-CM

## 2012-08-07 LAB — US OB TRANSVAGINAL

## 2012-08-07 NOTE — Progress Notes (Signed)
SEEM AT [facility] ON 08/06/12. PASSED MUCUS DISCHARGE. HAD SOME CRAMPING LAST NIGHT.

## 2012-08-07 NOTE — Patient Instructions (Addendum)
Multiple Pregnancy A multiple pregnancy is when a woman is pregnant with two or more fetuses. Multiple pregnancies occur in about 3% of all births. The more babies in a pregnancy, the greater the risks of problems to the babies and mother. This includes death. Since the use of Assisted Reproductive Technology (ART) and medications that can induce ovulation, multiple fetal gestation has increased.  RISKS TO THE MOTHER  Preeclampsia and eclampsia.  Postpartum bleeding (hemorrhage).  Kidney infection (pyelonephritis).  Develop anemia.  Develop diabetes.  Liver complications.  A blood clot blocks the artery, or branch of the artery leading to the lungs (pulmonary embolism).  Blood clots in the leg.  Placental separation.  Higher rate of Cesarean Section deliveries.  Women over 35 years old have a higher rate of Downs Syndrome babies. RISKS TO THE BABY  Preterm labor with a premature baby.  Very low birth weight babies that are less than 3 pounds, especially with triplets or mores.  Premature rupture of the membranes.  Twin to twin blood transfusion with one baby anemic and the other baby with too much blood in its system. There may also be heart failure.  With triplets or more, one of the babies is at high risk for cerebral palsy or other neurologic problem.  There is a higher incidence of fetal death. CARE OF MOTHERS WITH MULTIPLE FETAL GESTATION Multiple pregnancies need more care and special prenatal care.  You will see your caregiver more often.  You will have more tests including ultrasounds, nonstress tests and blood tests.  You will have special tests done called amniocentesis and a biophysical profile.  You may be hospitalized more often during the pregnancy.  You will be encouraged to eat a balanced and healthy diet with vitamin and mineral supplements as directed.  You will be asked to get more rest and sleep to keep up your energy.  You will be asked to  restrict your daily activities, exercise, work, household chores and sexual activity.  If you have preterm labor with small babies, you will be given a steroid injection to help the babies lungs mature and do better when born.  The delivery may have to be by Cesarean delivery, especially if there are triplets or more.  The delivery should be in a hospital with an intensive care nursery and Neonatologists (pediatrician for high risk babies) to care for the newborn babies. HOME CARE INSTRUCTIONS   Follow the caregiver's recommendations regarding office visits, tests for you and the babies, diet, rest and medications.  Avoid a large amount of physical activity.  Arrange to have help after the babies are born and when you go home from the hospital.  Take classes on how to care for multiple babies before you deliver them. SEEK IMMEDIATE MEDICAL CARE IF:   You develop a temperature of 102 F (38.9 C) or higher.  You are leaking fluid from the vagina.  You develop vaginal bleeding.  You develop uterine contractions.  You develop a severe headache, severe upper abdominal pain, visual problems or excessive swelling of your face, hands and feet.  You develop severe back pain or leg pain.  You develop severe tiredness.  You develop chest pain.  You have shortness of breath, fall down or pass out. Document Released: 11/10/2007 Document Revised: 04/25/2011 Document Reviewed: 11/10/2007 [employer] Patient Information 2014 [employer].  

## 2012-08-07 NOTE — Progress Notes (Signed)
Pt with having crmping and mucousy discharge. No bleeding. Sonogram revels viable intrauterine twin gestation, diamniotic/dichorionic based on membrane thickness Both viable and active, no other abnormalities

## 2012-08-09 ENCOUNTER — Telehealth: Payer: Self-pay | Admitting: Adult Health

## 2012-08-09 MED ORDER — FLUCONAZOLE 150 MG PO TABS: ORAL_TABLET | ORAL | Status: DC

## 2012-08-09 NOTE — Telephone Encounter (Signed)
Complains of yeast after taking keflex for UTI, will call in diflucan to [facility] in [place]

## 2012-08-09 NOTE — Telephone Encounter (Signed)
Left message that med had been called into [facility] in [place]. JSY

## 2012-08-09 NOTE — Telephone Encounter (Signed)
Pt states was given Keflex on 07/23/2012 and now has a yeast infection. Requesting medication for yeast e-scribed.

## 2012-08-14 ENCOUNTER — Telehealth: Payer: Self-pay | Admitting: Women's Health

## 2012-08-14 NOTE — Telephone Encounter (Signed)
PT DECIDED NOT TO LEAVE MESSAGE

## 2012-08-16 ENCOUNTER — Ambulatory Visit (INDEPENDENT_AMBULATORY_CARE_PROVIDER_SITE_OTHER): Payer: Medicaid Other | Admitting: Obstetrics and Gynecology

## 2012-08-16 VITALS — BP 130/80 | Wt 346.0 lb

## 2012-08-16 DIAGNOSIS — O26899 Other specified pregnancy related conditions, unspecified trimester: Secondary | ICD-10-CM

## 2012-08-16 DIAGNOSIS — Z1389 Encounter for screening for other disorder: Secondary | ICD-10-CM

## 2012-08-16 DIAGNOSIS — O2390 Unspecified genitourinary tract infection in pregnancy, unspecified trimester: Secondary | ICD-10-CM

## 2012-08-16 DIAGNOSIS — Z331 Pregnant state, incidental: Secondary | ICD-10-CM

## 2012-08-16 DIAGNOSIS — O30009 Twin pregnancy, unspecified number of placenta and unspecified number of amniotic sacs, unspecified trimester: Secondary | ICD-10-CM

## 2012-08-16 DIAGNOSIS — N898 Other specified noninflammatory disorders of vagina: Secondary | ICD-10-CM

## 2012-08-16 LAB — POCT WET PREP (WET MOUNT)
Source Wet Prep POC: NEGATIVE
WBC, Wet Prep HPF POC: POSITIVE

## 2012-08-16 LAB — POCT URINALYSIS DIPSTICK
Glucose, UA: NEGATIVE
Ketones, UA: NEGATIVE
Protein, UA: 1

## 2012-08-16 MED ORDER — NYSTATIN-TRIAMCINOLONE 100000-0.1 UNIT/GM-% EX OINT: TOPICAL_OINTMENT | Freq: Two times a day (BID) | CUTANEOUS | Status: DC

## 2012-08-16 MED ORDER — METRONIDAZOLE 0.75 % VA GEL: 1.0000 | Freq: Every day | VAGINAL | Status: DC

## 2012-08-16 NOTE — Patient Instructions (Addendum)
Cervicitis  Cervicitis is a soreness and swelling (inflammation) of the cervix. Your cervix is located at the bottom of your uterus which opens up to the vagina.   CAUSES    Sexually transmitted infections (STIs).   Allergic reaction.   Medicines or birth control devices that are put in the vagina.   Injury to the cervix.   Bacterial infections.  SYMPTOMS   There may be no symptoms. If symptoms occur, they may include:   Grey, white, yellow, or bad smelling vaginal discharge.   Pain or itching of the area outside the vagina.   Painful sexual intercourse.   Lower abdominal or lower back pain, especially during intercourse.   Frequent urination.   Abnormal vaginal bleeding between periods, after sexual intercourse, or after menopause.   Pressure or a heavy feeling in the pelvis.  DIAGNOSIS   Diagnosis is made after a pelvic exam. Other tests may include:   Examination of any discharge under a microscope (wet prep).   A Pap test.  TREATMENT   Treatment will depend on the cause of cervicitis. If it is caused by an STI, both you and your partner will need to be treated. Antibiotic medicines will be given.  HOME CARE INSTRUCTIONS    Do not have sexual intercourse until your caregiver says it is okay.   Do not have sexual intercourse until your partner has been treated if your cervicitis is caused by an STI.   Take your antibiotics as directed. Finish them even if you start to feel better.  SEEK IMMEDIATE MEDICAL CARE IF:    Your symptoms come back.   You have a fever.   You experience any problems that may be related to the medicine you are taking.  MAKE SURE YOU:    Understand these instructions.   Will watch your condition.   Will get help right away if you are not doing well or get worse.  Document Released: 01/31/2005 Document Revised: 04/25/2011 Document Reviewed: 08/30/2010  ExitCare Patient Information 2014 [employer].

## 2012-08-16 NOTE — Progress Notes (Signed)
S: work-in visit :  C/o moderate amount of vaginal bleeding x 1, light pink the day before. C/o vaginal discharge thick, green in color with itching. Recent tx with diflucan for itching / suspected yeast. O ;  Trans vaginal u/s limited shows healthy twin pregnancy, +fht x 2, + FM x 2,  no subchorionic bleeding, no fluid in lower ut segment, normal          Wet Prep:- trich, - yeast.         Cervix: everted. BJY:[id number], mild Rx Metrogel, mytrex for vulvar irritation.

## 2012-08-27 ENCOUNTER — Other Ambulatory Visit: Payer: Self-pay | Admitting: Obstetrics & Gynecology

## 2012-08-27 ENCOUNTER — Other Ambulatory Visit: Payer: Self-pay | Admitting: Women's Health

## 2012-08-27 ENCOUNTER — Ambulatory Visit (INDEPENDENT_AMBULATORY_CARE_PROVIDER_SITE_OTHER): Payer: Medicaid Other | Admitting: Women's Health

## 2012-08-27 ENCOUNTER — Encounter: Payer: Self-pay | Admitting: Women's Health

## 2012-08-27 ENCOUNTER — Ambulatory Visit (INDEPENDENT_AMBULATORY_CARE_PROVIDER_SITE_OTHER): Payer: Medicaid Other

## 2012-08-27 VITALS — BP 138/72 | Wt 349.6 lb

## 2012-08-27 DIAGNOSIS — O200 Threatened abortion: Secondary | ICD-10-CM

## 2012-08-27 DIAGNOSIS — O0991 Supervision of high risk pregnancy, unspecified, first trimester: Secondary | ICD-10-CM

## 2012-08-27 DIAGNOSIS — Z331 Pregnant state, incidental: Secondary | ICD-10-CM

## 2012-08-27 DIAGNOSIS — O30049 Twin pregnancy, dichorionic/diamniotic, unspecified trimester: Secondary | ICD-10-CM

## 2012-08-27 DIAGNOSIS — O30009 Twin pregnancy, unspecified number of placenta and unspecified number of amniotic sacs, unspecified trimester: Secondary | ICD-10-CM

## 2012-08-27 DIAGNOSIS — Z905 Acquired absence of kidney: Secondary | ICD-10-CM

## 2012-08-27 DIAGNOSIS — Z1389 Encounter for screening for other disorder: Secondary | ICD-10-CM

## 2012-08-27 LAB — POCT URINALYSIS DIPSTICK
Glucose, UA: NEGATIVE
Nitrite, UA: NEGATIVE

## 2012-08-27 MED ORDER — PRENATAL PLUS 27-1 MG PO TABS: 1.0000 | ORAL_TABLET | Freq: Every day | ORAL | Status: DC

## 2012-08-27 NOTE — Progress Notes (Signed)
Reports good fm. Denies uc's, lof, vb, urinary frequency, urgency, hesitancy, or dysuria.  No complaints.  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 Will get baseline CMP when she returns urine. Requested work note defining lifting limitations. Reviewed u/s results and warning s/s to report.  All questions answered. F/U asap for early 2hr gtt, then 4wks for 2nd IT and visit.

## 2012-08-27 NOTE — Addendum Note (Signed)
Addended by: Shawna Clamp R on: 08/27/2012 11:17 AM   Modules accepted: Orders

## 2012-08-27 NOTE — Patient Instructions (Addendum)
You will have your sugar test next visit.  Please do not eat or drink anything after midnight the night before you come, not even water.  You will be here for at least two hours.    Pregnancy - Second Trimester The second trimester of pregnancy (3 to 6 months) is a period of rapid growth for you and your baby. At the end of the sixth month, your baby is about 9 inches long and weighs 1 1/2 pounds. You will begin to feel the baby move between 18 and 20 weeks of the pregnancy. This is called quickening. Weight gain is faster. A clear fluid (colostrum) may leak out of your breasts. You may feel small contractions of the womb (uterus). This is known as false labor or Braxton-Hicks contractions. This is like a practice for labor when the baby is ready to be born. Usually, the problems with morning sickness have usually passed by the end of your first trimester. Some women develop small dark blotches (called cholasma, mask of pregnancy) on their face that usually goes away after the baby is born. Exposure to the sun makes the blotches worse. Acne may also develop in some pregnant women and pregnant women who have acne, may find that it goes away. PRENATAL EXAMS  Blood work may continue to be done during prenatal exams. These tests are done to check on your health and the probable health of your baby. Blood work is used to follow your blood levels (hemoglobin). Anemia (low hemoglobin) is common during pregnancy. Iron and vitamins are given to help prevent this. You will also be checked for diabetes between 24 and 28 weeks of the pregnancy. Some of the previous blood tests may be repeated.  The size of the uterus is measured during each visit. This is to make sure that the baby is continuing to grow properly according to the dates of the pregnancy.  Your blood pressure is checked every prenatal visit. This is to make sure you are not getting toxemia.  Your urine is checked to make sure you do not have an  infection, diabetes or protein in the urine.  Your weight is checked often to make sure gains are happening at the suggested rate. This is to ensure that both you and your baby are growing normally.  Sometimes, an ultrasound is performed to confirm the proper growth and development of the baby. This is a test which bounces harmless sound waves off the baby so your caregiver can more accurately determine due dates. Sometimes, a test is done on the amniotic fluid surrounding the baby. This test is called an amniocentesis. The amniotic fluid is obtained by sticking a needle into the belly (abdomen). This is done to check the chromosomes in instances where there is a concern about possible genetic problems with the baby. It is also sometimes done near the end of pregnancy if an early delivery is required. In this case, it is done to help make sure the baby's lungs are mature enough for the baby to live outside of the womb. CHANGES OCCURING IN THE SECOND TRIMESTER OF PREGNANCY Your body goes through many changes during pregnancy. They vary from person to person. Talk to your caregiver about changes you notice that you are concerned about.  During the second trimester, you will likely have an increase in your appetite. It is normal to have cravings for certain foods. This varies from person to person and pregnancy to pregnancy.  Your lower abdomen will begin to   bulge.  You may have to urinate more often because the uterus and baby are pressing on your bladder. It is also common to get more bladder infections during pregnancy. You can help this by drinking lots of fluids and emptying your bladder before and after intercourse.  You may begin to get stretch marks on your hips, abdomen, and breasts. These are normal changes in the body during pregnancy. There are no exercises or medicines to take that prevent this change.  You may begin to develop swollen and bulging veins (varicose veins) in your legs.  Wearing support hose, elevating your feet for 15 minutes, 3 to 4 times a day and limiting salt in your diet helps lessen the problem.  Heartburn may develop as the uterus grows and pushes up against the stomach. Antacids recommended by your caregiver helps with this problem. Also, eating smaller meals 4 to 5 times a day helps.  Constipation can be treated with a stool softener or adding bulk to your diet. Drinking lots of fluids, and eating vegetables, fruits, and whole grains are helpful.  Exercising is also helpful. If you have been very active up until your pregnancy, most of these activities can be continued during your pregnancy. If you have been less active, it is helpful to start an exercise program such as walking.  Hemorrhoids may develop at the end of the second trimester. Warm sitz baths and hemorrhoid cream recommended by your caregiver helps hemorrhoid problems.  Backaches may develop during this time of your pregnancy. Avoid heavy lifting, wear low heal shoes, and practice good posture to help with backache problems.  Some pregnant women develop tingling and numbness of their hand and fingers because of swelling and tightening of ligaments in the wrist (carpel tunnel syndrome). This goes away after the baby is born.  As your breasts enlarge, you may have to get a bigger bra. Get a comfortable, cotton, support bra. Do not get a nursing bra until the last month of the pregnancy if you will be nursing the baby.  You may get a dark line from your belly button to the pubic area called the linea nigra.  You may develop rosy cheeks because of increase blood flow to the face.  You may develop spider looking lines of the face, neck, arms, and chest. These go away after the baby is born. HOME CARE INSTRUCTIONS   It is extremely important to avoid all smoking, herbs, alcohol, and unprescribed drugs during your pregnancy. These chemicals affect the formation and growth of the baby. Avoid  these chemicals throughout the pregnancy to ensure the delivery of a healthy infant.  Most of your home care instructions are the same as suggested for the first trimester of your pregnancy. Keep your caregiver's appointments. Follow your caregiver's instructions regarding medicine use, exercise, and diet.  During pregnancy, you are providing food for you and your baby. Continue to eat regular, well-balanced meals. Choose foods such as meat, fish, milk and other low fat dairy products, vegetables, fruits, and whole-grain breads and cereals. Your caregiver will tell you of the ideal weight gain.  A physical sexual relationship may be continued up until near the end of pregnancy if there are no other problems. Problems could include early (premature) leaking of amniotic fluid from the membranes, vaginal bleeding, abdominal pain, or other medical or pregnancy problems.  Exercise regularly if there are no restrictions. Check with your caregiver if you are unsure of the safety of some of your exercises. The   greatest weight gain will occur in the last 2 trimesters of pregnancy. Exercise will help you:  Control your weight.  Get you in shape for labor and delivery.  Lose weight after you have the baby.  Wear a good support or jogging bra for breast tenderness during pregnancy. This may help if worn during sleep. Pads or tissues may be used in the bra if you are leaking colostrum.  Do not use hot tubs, steam rooms or saunas throughout the pregnancy.  Wear your seat belt at all times when driving. This protects you and your baby if you are in an accident.  Avoid raw meat, uncooked cheese, cat litter boxes, and soil used by cats. These carry germs that can cause birth defects in the baby.  The second trimester is also a good time to visit your dentist for your dental health if this has not been done yet. Getting your teeth cleaned is okay. Use a soft toothbrush. Brush gently during pregnancy.  It is  easier to leak urine during pregnancy. Tightening up and strengthening the pelvic muscles will help with this problem. Practice stopping your urination while you are going to the bathroom. These are the same muscles you need to strengthen. It is also the muscles you would use as if you were trying to stop from passing gas. You can practice tightening these muscles up 10 times a set and repeating this about 3 times per day. Once you know what muscles to tighten up, do not perform these exercises during urination. It is more likely to contribute to an infection by backing up the urine.  Ask for help if you have financial, counseling, or nutritional needs during pregnancy. Your caregiver will be able to offer counseling for these needs as well as refer you for other special needs.  Your skin may become oily. If so, wash your face with mild soap, use non-greasy moisturizer and oil or cream based makeup. MEDICINES AND DRUG USE IN PREGNANCY  Take prenatal vitamins as directed. The vitamin should contain 1 milligram of folic acid. Keep all vitamins out of reach of children. Only a couple vitamins or tablets containing iron may be fatal to a baby or young child when ingested.  Avoid use of all medicines, including herbs, over-the-counter medicines, not prescribed or suggested by your caregiver. Only take over-the-counter or prescription medicines for pain, discomfort, or fever as directed by your caregiver. Do not use aspirin.  Let your caregiver also know about herbs you may be using.  Alcohol is related to a number of birth defects. This includes fetal alcohol syndrome. All alcohol, in any form, should be avoided completely. Smoking will cause low birth rate and premature babies.  Street or illegal drugs are very harmful to the baby. They are absolutely forbidden. A baby born to an addicted mother will be addicted at birth. The baby will go through the same withdrawal an adult does. SEEK MEDICAL CARE IF:    You have any concerns or worries during your pregnancy. It is better to call with your questions if you feel they cannot wait, rather than worry about them. SEEK IMMEDIATE MEDICAL CARE IF:   An unexplained oral temperature above 102 F (38.9 C) develops, or as your caregiver suggests.  You have leaking of fluid from the vagina (birth canal). If leaking membranes are suspected, take your temperature and tell your caregiver of this when you call.  There is vaginal spotting, bleeding, or passing clots. Tell your caregiver of   the amount and how many pads are used. Light spotting in pregnancy is common, especially following intercourse.  You develop a bad smelling vaginal discharge with a change in the color from clear to white.  You continue to feel sick to your stomach (nauseated) and have no relief from remedies suggested. You vomit blood or coffee ground-like materials.  You lose more than 2 pounds of weight or gain more than 2 pounds of weight over 1 week, or as suggested by your caregiver.  You notice swelling of your face, hands, feet, or legs.  You get exposed to German measles and have never had them.  You are exposed to fifth disease or chickenpox.  You develop belly (abdominal) pain. Round ligament discomfort is a common non-cancerous (benign) cause of abdominal pain in pregnancy. Your caregiver still must evaluate you.  You develop a bad headache that does not go away.  You develop fever, diarrhea, pain with urination, or shortness of breath.  You develop visual problems, blurry, or double vision.  You fall or are in a car accident or any kind of trauma.  There is mental or physical violence at home. Document Released: 01/25/2001 Document Revised: 10/26/2011 Document Reviewed: 07/30/2008 [employer] Patient Information 2014 [employer].  

## 2012-08-27 NOTE — Progress Notes (Signed)
Needs note for work stating any limitations from now until the end. 1st ITNT.

## 2012-08-27 NOTE — Progress Notes (Signed)
TWINS DI/DI, BABY A , CRL=6.0 cm/12w 2d, NT@ 1.5mm, FHT 169 bpm// TWIN B, CRL 5.7cm, NT@1 .5mm, FHT 165 BPM, cx long and closed, inter-twin membrane visualized, bilat adn viewed, suboptimal scan d/t pt body habitus.

## 2012-08-29 ENCOUNTER — Other Ambulatory Visit: Payer: Self-pay

## 2012-08-29 DIAGNOSIS — Z905 Acquired absence of kidney: Secondary | ICD-10-CM

## 2012-08-29 NOTE — Addendum Note (Signed)
Addended by: Colen Darling on: 08/29/2012 09:10 AM   Modules accepted: Orders

## 2012-08-30 LAB — COMPREHENSIVE METABOLIC PANEL
ALT: 73 U/L — ABNORMAL HIGH (ref 0–35)
Albumin: 3.2 g/dL — ABNORMAL LOW (ref 3.5–5.2)
CO2: 20 mEq/L (ref 19–32)
Calcium: 8.6 mg/dL (ref 8.4–10.5)
Chloride: 105 mEq/L (ref 96–112)
Potassium: 3.5 mEq/L (ref 3.5–5.3)
Sodium: 136 mEq/L (ref 135–145)
Total Protein: 5.7 g/dL — ABNORMAL LOW (ref 6.0–8.3)

## 2012-08-30 LAB — PROTEIN, URINE, 24 HOUR: Protein, 24H Urine: 80 mg/d (ref 50–100)

## 2012-08-30 LAB — GLUCOSE TOLERANCE, 2 HOURS W/ 1HR
Glucose, 1 hour: 118 mg/dL (ref 70–170)
Glucose, Fasting: 73 mg/dL (ref 70–99)

## 2012-08-31 LAB — MATERNAL SCREEN, INTEGRATED #1

## 2012-09-07 ENCOUNTER — Telehealth: Payer: Self-pay | Admitting: Obstetrics and Gynecology

## 2012-09-07 NOTE — Telephone Encounter (Signed)
Spoke with pt. Had vomiting last pm and this am. Noticed a smear of brown this am when she wiped, has not seen it since. No cramping or pain. Last BM was yest evening, and last intercourse was 1 and a half weeks ago. Advised everything sounds fine at this point. Advised to watch for any cramping or if spotting turns to red. If this happens over the weekend, go to ER. Advised no sexual intercourse for 7 days from the last time she wipes any color. Pt voiced understanding. JSY

## 2012-09-10 ENCOUNTER — Ambulatory Visit (INDEPENDENT_AMBULATORY_CARE_PROVIDER_SITE_OTHER): Payer: Medicaid Other | Admitting: Adult Health

## 2012-09-10 ENCOUNTER — Encounter: Payer: Self-pay | Admitting: Adult Health

## 2012-09-10 VITALS — BP 130/78 | Wt 347.0 lb

## 2012-09-10 DIAGNOSIS — Z331 Pregnant state, incidental: Secondary | ICD-10-CM

## 2012-09-10 DIAGNOSIS — Z1389 Encounter for screening for other disorder: Secondary | ICD-10-CM

## 2012-09-10 DIAGNOSIS — N949 Unspecified condition associated with female genital organs and menstrual cycle: Secondary | ICD-10-CM

## 2012-09-10 DIAGNOSIS — O30009 Twin pregnancy, unspecified number of placenta and unspecified number of amniotic sacs, unspecified trimester: Secondary | ICD-10-CM

## 2012-09-10 DIAGNOSIS — O30002 Twin pregnancy, unspecified number of placenta and unspecified number of amniotic sacs, second trimester: Secondary | ICD-10-CM

## 2012-09-10 HISTORY — DX: Unspecified condition associated with female genital organs and menstrual cycle: N94.9

## 2012-09-10 LAB — POCT URINALYSIS DIPSTICK
Blood, UA: NEGATIVE
Nitrite, UA: NEGATIVE

## 2012-09-10 MED ORDER — PROMETHAZINE HCL 25 MG PO TABS: 25.0000 mg | ORAL_TABLET | Freq: Four times a day (QID) | ORAL | Status: DC | PRN

## 2012-09-10 MED ORDER — OMEPRAZOLE 20 MG PO CPDR: 20.0000 mg | DELAYED_RELEASE_CAPSULE | Freq: Every day | ORAL | Status: DC

## 2012-09-10 NOTE — Progress Notes (Signed)
Has frequency and pain LLQ at times.suspect round ligament pain US showed active babies and FHM 175,last week had brown discharge none now, she is taking macrobid nightly.Her urine was negative, follow up as scheduled.Call if needed

## 2012-09-10 NOTE — Patient Instructions (Addendum)
Follow up as scheduled Round Ligament Pain The round ligament is made up of muscle and fibrous tissue. It is attached to the uterus near the fallopian tube. The round ligament is located on both sides of the uterus and helps support the position of the uterus. It usually begins in the second trimester of pregnancy when the uterus comes out of the pelvis. The pain can come and go until the baby is delivered. Round ligament pain is not a serious problem and does not cause harm to the baby. CAUSE During pregnancy the uterus grows the most from the second trimester to delivery. As it grows, it stretches and slightly twists the round ligaments. When the uterus leans from one side to the other, the round ligament on the opposite side pulls and stretches. This can cause pain. SYMPTOMS  Pain can occur on one side or both sides. The pain is usually a short, sharp, and pinching-like. Sometimes it can be a dull, lingering and aching pain. The pain is located in the lower side of the abdomen or in the groin. The pain is internal and usually starts deep in the groin and moves up to the outside of the hip area. Pain can occur with:  Sudden change in position like getting out of bed or a chair.  Rolling over in bed.  Coughing or sneezing.  Walking too much.  Any type of physical activity. DIAGNOSIS  Your caregiver will make sure there are no serious problems causing the pain. When nothing serious is found, the symptoms usually indicate that the pain is from the round ligament. TREATMENT   Sit down and relax when the pain starts.  Flex your knees up to your belly.  Lay on your side with a pillow under your belly (abdomen) and another one between your legs.  Sit in a hot bath for 15 to 20 minutes or until the pain goes away. HOME CARE INSTRUCTIONS   Only take over-the-counter or prescriptions medicines for pain, discomfort or fever as directed by your caregiver.  Sit and stand slowly.  Avoid long  walks if it causes pain.  Stop or lessen your physical activities if it causes pain. SEEK MEDICAL CARE IF:   The pain does not go away with any of your treatment.  You need stronger medication for the pain.  You develop back pain that you did not have before with the side pain. SEEK IMMEDIATE MEDICAL CARE IF:   You develop a temperature of 102 F (38.9 C) or higher.  You develop uterine contractions.  You develop vaginal bleeding.  You develop nausea, vomiting or diarrhea.  You develop chills.  You have pain when you urinate. Document Released: 11/10/2007 Document Revised: 04/25/2011 Document Reviewed: 11/10/2007 [facility] Patient Information 2014 [place], [place].

## 2012-09-10 NOTE — Progress Notes (Signed)
Pt here today for urinary frequency, goes every 30 minutes. Pt denies any other problems or concerns at this time.

## 2012-09-24 ENCOUNTER — Encounter: Payer: Self-pay | Admitting: Women's Health

## 2012-09-24 ENCOUNTER — Ambulatory Visit (INDEPENDENT_AMBULATORY_CARE_PROVIDER_SITE_OTHER): Payer: Medicaid Other | Admitting: Women's Health

## 2012-09-24 ENCOUNTER — Ambulatory Visit (INDEPENDENT_AMBULATORY_CARE_PROVIDER_SITE_OTHER): Payer: Medicaid Other

## 2012-09-24 ENCOUNTER — Other Ambulatory Visit: Payer: Self-pay | Admitting: Women's Health

## 2012-09-24 ENCOUNTER — Other Ambulatory Visit: Payer: Self-pay | Admitting: Adult Health

## 2012-09-24 VITALS — BP 136/80 | Wt 345.6 lb

## 2012-09-24 DIAGNOSIS — O30002 Twin pregnancy, unspecified number of placenta and unspecified number of amniotic sacs, second trimester: Secondary | ICD-10-CM

## 2012-09-24 DIAGNOSIS — O30049 Twin pregnancy, dichorionic/diamniotic, unspecified trimester: Secondary | ICD-10-CM

## 2012-09-24 DIAGNOSIS — R7989 Other specified abnormal findings of blood chemistry: Secondary | ICD-10-CM | POA: Insufficient documentation

## 2012-09-24 DIAGNOSIS — O30009 Twin pregnancy, unspecified number of placenta and unspecified number of amniotic sacs, unspecified trimester: Secondary | ICD-10-CM

## 2012-09-24 DIAGNOSIS — O99210 Obesity complicating pregnancy, unspecified trimester: Secondary | ICD-10-CM

## 2012-09-24 DIAGNOSIS — Z905 Acquired absence of kidney: Secondary | ICD-10-CM

## 2012-09-24 DIAGNOSIS — O26892 Other specified pregnancy related conditions, second trimester: Secondary | ICD-10-CM

## 2012-09-24 DIAGNOSIS — Z331 Pregnant state, incidental: Secondary | ICD-10-CM

## 2012-09-24 DIAGNOSIS — Z1389 Encounter for screening for other disorder: Secondary | ICD-10-CM

## 2012-09-24 DIAGNOSIS — O0992 Supervision of high risk pregnancy, unspecified, second trimester: Secondary | ICD-10-CM

## 2012-09-24 LAB — POCT URINALYSIS DIPSTICK
Glucose, UA: NEGATIVE
Nitrite, UA: NEGATIVE
Protein, UA: NEGATIVE

## 2012-09-24 MED ORDER — BUTALBITAL-APAP-CAFFEINE 50-325-40 MG PO TABS: 1.0000 | ORAL_TABLET | Freq: Four times a day (QID) | ORAL | Status: DC | PRN

## 2012-09-24 NOTE — Progress Notes (Signed)
Denies cramping, lof, vb, urinary frequency, urgency, hesitancy, or dysuria. Does have daily frontal dull/achy headaches w/ concurrent occasional sharp pain in upper abd, apap not helping, discussed w/ LHE, rx fioricet.  Also discussed elevated LFTs w/ LHE, pt has been taking apap d/t ha's, will recheck w/ 28wk labs.  Reviewed warning s/s to report.  All questions answered. 2nd IT today. F/U in 4wks for anatomy u/s and visit.

## 2012-09-24 NOTE — Progress Notes (Signed)
TWINS, DI/DI, MEAS. C/W DATES FOR BOTH TWIN A AND TWIN B, BOTH VERTEX LIE, TW-A  MAT. LT, TW B- MAT. RT, BOTH FLUID NML, CX CLOSED 4.5 CM, BOTH ACTIVE, TW-A FHT149/BPM, TW-B FHT 140/BPM, DIFFICULT EVAL D/T MATERNAL BODY HABITUS

## 2012-09-24 NOTE — Progress Notes (Signed)
2nd IT today. 

## 2012-09-24 NOTE — Patient Instructions (Signed)
Migraine Headache A migraine headache is an intense, throbbing pain on one or both sides of your head. A migraine can last for 30 minutes to several hours. CAUSES  The exact cause of a migraine headache is not always known. However, a migraine may be caused when nerves in the brain become irritated and release chemicals that cause inflammation. This causes pain. SYMPTOMS  Pain on one or both sides of your head.  Pulsating or throbbing pain.  Severe pain that prevents daily activities.  Pain that is aggravated by any physical activity.  Nausea, vomiting, or both.  Dizziness.  Pain with exposure to bright lights, loud noises, or activity.  General sensitivity to bright lights, loud noises, or smells. Before you get a migraine, you may get warning signs that a migraine is coming (aura). An aura may include:  Seeing flashing lights.  Seeing bright spots, halos, or zig-zag lines.  Having tunnel vision or blurred vision.  Having feelings of numbness or tingling.  Having trouble talking.  Having muscle weakness. MIGRAINE TRIGGERS  Alcohol.  Smoking.  Stress.  Menstruation.  Aged cheeses.  Foods or drinks that contain nitrates, glutamate, aspartame, or tyramine.  Lack of sleep.  Chocolate.  Caffeine.  Hunger.  Physical exertion.  Fatigue.  Medicines used to treat chest pain (nitroglycerine), birth control pills, estrogen, and some blood pressure medicines. DIAGNOSIS  A migraine headache is often diagnosed based on:  Symptoms.  Physical examination.  A CT scan or MRI of your head. TREATMENT Medicines may be given for pain and nausea. Medicines can also be given to help prevent recurrent migraines.  HOME CARE INSTRUCTIONS  Only take over-the-counter or prescription medicines for pain or discomfort as directed by your caregiver. The use of long-term narcotics is not recommended.  Lie down in a dark, quiet room when you have a migraine.  Keep a journal  to find out what may trigger your migraine headaches. For example, write down:  What you eat and drink.  How much sleep you get.  Any change to your diet or medicines.  Limit alcohol consumption.  Quit smoking if you smoke.  Get 7 to 9 hours of sleep, or as recommended by your caregiver.  Limit stress.  Keep lights dim if bright lights bother you and make your migraines worse. SEEK IMMEDIATE MEDICAL CARE IF:   Your migraine becomes severe.  You have a fever.  You have a stiff neck.  You have vision loss.  You have muscular weakness or loss of muscle control.  You start losing your balance or have trouble walking.  You feel faint or pass out.  You have severe symptoms that are different from your first symptoms. MAKE SURE YOU:   Understand these instructions.  Will watch your condition.  Will get help right away if you are not doing well or get worse. Document Released: 01/31/2005 Document Revised: 04/25/2011 Document Reviewed: 01/21/2011 Exit[employer] Patient Information 2014 [employer].  

## 2012-09-25 ENCOUNTER — Encounter (HOSPITAL_COMMUNITY): Payer: Self-pay | Admitting: *Deleted

## 2012-09-25 ENCOUNTER — Inpatient Hospital Stay (HOSPITAL_COMMUNITY): Payer: Medicaid Other

## 2012-09-25 ENCOUNTER — Inpatient Hospital Stay (HOSPITAL_COMMUNITY)
Admission: AD | Admit: 2012-09-25 | Discharge: 2012-09-26 | Disposition: A | Discharge status: Home / Self Care | Payer: Medicaid Other | Source: Ambulatory Visit | Attending: Obstetrics & Gynecology | Admitting: Obstetrics & Gynecology

## 2012-09-25 DIAGNOSIS — R109 Unspecified abdominal pain: Secondary | ICD-10-CM | POA: Insufficient documentation

## 2012-09-25 DIAGNOSIS — O26899 Other specified pregnancy related conditions, unspecified trimester: Secondary | ICD-10-CM

## 2012-09-25 DIAGNOSIS — O99891 Other specified diseases and conditions complicating pregnancy: Secondary | ICD-10-CM | POA: Insufficient documentation

## 2012-09-25 DIAGNOSIS — O30049 Twin pregnancy, dichorionic/diamniotic, unspecified trimester: Secondary | ICD-10-CM | POA: Insufficient documentation

## 2012-09-25 DIAGNOSIS — O30009 Twin pregnancy, unspecified number of placenta and unspecified number of amniotic sacs, unspecified trimester: Secondary | ICD-10-CM | POA: Insufficient documentation

## 2012-09-25 LAB — URINALYSIS, ROUTINE W REFLEX MICROSCOPIC
Bilirubin Urine: NEGATIVE
Ketones, ur: NEGATIVE mg/dL
Leukocytes, UA: NEGATIVE
Nitrite: NEGATIVE
Specific Gravity, Urine: 1.01 (ref 1.005–1.030)
Urobilinogen, UA: 0.2 mg/dL (ref 0.0–1.0)

## 2012-09-25 NOTE — MAU Note (Signed)
PT SAYS  SHE STARTED HAVING PAIN AT 7 PM -  IN HER BACK AS CRAMPS-  AS WAVES OF PAINS  IN HER  SIDES.   SHE FEELS WET- LOOKED - HAS INCREASE MUCUS D/C.    HAS TWINS.  GETS [facility] AT [facility]-  LAST SEEN YESTERDAY-  EVERYTHING OK.      LAST SEX-    TODAY 10AM .     NO BURNING WITH VOIDING

## 2012-09-25 NOTE — MAU Provider Note (Signed)
Chief Complaint: No chief complaint on file.   First Provider Initiated Contact with Patient 09/26/12 0025      SUBJECTIVE HPI: Alice [name] is a 27 y.o. G2P0101 at [redacted]w[redacted]d by LMP who presents with intermittent, crampy pain along both sides of her abd today. Di/Di twin pregnancy. Getting care at [facility]. Normal ultrasound and office visit yesterday. Denies fever, chills, dysuria, frequency, urgency, flank pain, nausea, vomiting, diarrhea, constipation, vaginal bleeding. Reported mucoid vaginal discharge to RN, but denied vaginal discharge to CNM. Last intercourse 09/25/2012. Has not tried anything for the pain. States there are no aggravating or alleviating factors.  Past Medical History  Diagnosis Date  . Congenital obstruction of ureteropelvic junction (UPJ)   . History of kidney stones   . 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 History of recurrent UTIs   . Round ligament pain 09/10/2012   OB History   Grav Para Term Preterm Abortions TAB SAB Ect Mult Living   2 1  1      1      # Outc Date GA Lbr Len/2nd Wgt Sex Del Anes PTL Lv   1 PRE 2012 [redacted]w[redacted]d  2.92kg(6lb7oz) M SVD EPI No Yes   2 CUR              Past Surgical History  Procedure Laterality Date  . Nephrectomy      right side  . Nasal sinus surgery    . Tonsillectomy     History   Social History  . Marital Status: Single    Spouse Name: N/A    Number of Children: N/A  . Years of Education: N/A   Occupational History  . Not on file.   Social History Main Topics  . Smoking status: Former Smoker    Types: Cigarettes  . Smokeless tobacco: Never Used  . Alcohol Use: No  . Drug Use: No  . Sexually Active: Yes    Birth Control/ Protection: None   Other Topics Concern  . Not on file   Social History Narrative  . No narrative on file   No current facility-administered medications on file prior to encounter.   Current Outpatient Prescriptions on File Prior to Encounter   Medication Sig Dispense Refill  . Doxylamine-Pyridoxine (DICLEGIS) 10-10 MG TBEC Take 10 mg by mouth See admin instructions.  100 tablet  4  . nitrofurantoin, macrocrystal-monohydrate, (MACROBID) 100 MG capsule Take 1 capsule (100 mg total) by mouth at bedtime.  30 capsule  9  . omeprazole (PRILOSEC) 20 MG capsule Take 1 capsule (20 mg total) by mouth daily.  30 capsule  11  . prenatal vitamin w/FE, FA (PRENATAL 1 + 1) 27-1 MG TABS Take 1 tablet by mouth daily.  30 each  12  . promethazine (PHENERGAN) 25 MG tablet Take 1 tablet (25 mg total) by mouth every 6 (six) hours as needed for nausea.  30 tablet  1  . butalbital-acetaminophen-caffeine (FIORICET) 50-325-40 MG per tablet Take 1-2 tablets by mouth every 6 (six) hours as needed for headache.  20 tablet  1   Allergies  Allergen Reactions  . Cinnamon Flavor Swelling    Artificial cinnamon causes tongue to swell  . Codeine Nausea Only    Straight codeine only  . Ibuprofen Other (See Comments)    Causes severe pain in stomach  . Levofloxacin Hives  . Reglan (Metoclopramide) Other (See Comments)    REACTION: causes skin to "crawl"  ROS: Pertinent items in HPI.  OBJECTIVE Blood pressure 126/81, pulse 105, temperature 97.9 F (36.6 C), temperature source Oral, resp. rate 18, height 5\' 7"  (1.702 m), weight 158.079 kg (348 lb 8 oz), last menstrual period 06/02/2012. GENERAL: Well-developed, well-nourished female in mild-moderate distress.  HEENT: Normocephalic HEART: normal rate RESP: normal effort ABDOMEN: Soft, bilateral groin tenderness.  EXTREMITIES: Nontender, no edema NEURO: Alert and oriented SPECULUM EXAM: Declined. PELVIC EXAM: Cervix long, closed, posterior. No blood or fluid on glove.  Unable to assess fetal heart tones by Doppler.  LAB RESULTS UA neg  IMAGING FHR 155/147. CL 5.33 cm   MAU COURSE Discussed excellent scratch that discussed normal cervical length by ultrasound. Flexeril given. Patient requesting to  leave before waiting to see how Flexeril works.  ASSESSMENT 1. Twin gestation, dichorionic diamniotic   2. Pain of round ligament complicating pregnancy, antepartum    PLAN Discharge home in stable condition. Comfort measures. Paper prescription for Flexeril given. Followup with [facility] as scheduled or MAU as needed in emergencies.   Medication List    ASK your doctor about these medications       butalbital-acetaminophen-caffeine 50-325-40 MG per tablet  Commonly known as:  FIORICET  Take 1-2 tablets by mouth every 6 (six) hours as needed for headache.     Doxylamine-Pyridoxine 10-10 MG Tbec  Commonly known as:  DICLEGIS  Take 10 mg by mouth See admin instructions.     nitrofurantoin (macrocrystal-monohydrate) 100 MG capsule  Commonly known as:  MACROBID  Take 1 capsule (100 mg total) by mouth at bedtime.     omeprazole 20 MG capsule  Commonly known as:  PRILOSEC  Take 1 capsule (20 mg total) by mouth daily.     prenatal vitamin w/FE, FA 27-1 MG Tabs tablet  Take 1 tablet by mouth daily.     promethazine 25 MG tablet  Commonly known as:  PHENERGAN  Take 1 tablet (25 mg total) by mouth every 6 (six) hours as needed for nausea.       [place], [place] 09/25/2012  10:22 PM

## 2012-09-26 DIAGNOSIS — O30049 Twin pregnancy, dichorionic/diamniotic, unspecified trimester: Secondary | ICD-10-CM

## 2012-09-26 MED ORDER — CYCLOBENZAPRINE HCL 5 MG PO TABS
ORAL_TABLET | ORAL | Status: AC
  Filled 2012-09-26: qty 2

## 2012-09-28 LAB — MATERNAL SCREEN, INTEGRATED #2
AFP, Serum: 33.7 ng/mL
Calculated Gestational Age: 16.1
Crown Rump Length: 60 mm
NT MoM: 1.16
Nuchal Translucency: 1.6 mm
hCG MoM: 1.21
hCG, Serum: 22 IU/mL

## 2012-09-29 ENCOUNTER — Encounter: Payer: Self-pay | Admitting: Women's Health

## 2012-09-30 NOTE — MAU Provider Note (Signed)
Attestation of Attending Supervision of Advanced Practitioner (CNM/NP): Evaluation and management procedures were performed by the Advanced Practitioner under my supervision and collaboration. I have reviewed the Advanced Practitioner's note and chart, and I agree with the management and plan.  Billiejo Sorto H. 2:10 PM

## 2012-10-22 ENCOUNTER — Ambulatory Visit (INDEPENDENT_AMBULATORY_CARE_PROVIDER_SITE_OTHER): Payer: Medicaid Other

## 2012-10-22 ENCOUNTER — Encounter: Payer: Medicaid Other | Admitting: Women's Health

## 2012-10-22 ENCOUNTER — Ambulatory Visit (INDEPENDENT_AMBULATORY_CARE_PROVIDER_SITE_OTHER): Payer: Medicaid Other | Admitting: Women's Health

## 2012-10-22 ENCOUNTER — Other Ambulatory Visit: Payer: Self-pay | Admitting: Women's Health

## 2012-10-22 ENCOUNTER — Other Ambulatory Visit: Payer: Medicaid Other

## 2012-10-22 VITALS — BP 120/60 | Wt 357.2 lb

## 2012-10-22 DIAGNOSIS — O09219 Supervision of pregnancy with history of pre-term labor, unspecified trimester: Secondary | ICD-10-CM

## 2012-10-22 DIAGNOSIS — O30009 Twin pregnancy, unspecified number of placenta and unspecified number of amniotic sacs, unspecified trimester: Secondary | ICD-10-CM

## 2012-10-22 DIAGNOSIS — Z331 Pregnant state, incidental: Secondary | ICD-10-CM

## 2012-10-22 DIAGNOSIS — O0992 Supervision of high risk pregnancy, unspecified, second trimester: Secondary | ICD-10-CM

## 2012-10-22 DIAGNOSIS — Z1389 Encounter for screening for other disorder: Secondary | ICD-10-CM

## 2012-10-22 DIAGNOSIS — B9689 Other specified bacterial agents as the cause of diseases classified elsewhere: Secondary | ICD-10-CM

## 2012-10-22 DIAGNOSIS — N898 Other specified noninflammatory disorders of vagina: Secondary | ICD-10-CM

## 2012-10-22 DIAGNOSIS — O2390 Unspecified genitourinary tract infection in pregnancy, unspecified trimester: Secondary | ICD-10-CM

## 2012-10-22 DIAGNOSIS — E669 Obesity, unspecified: Secondary | ICD-10-CM

## 2012-10-22 LAB — POCT URINALYSIS DIPSTICK
Glucose, UA: NEGATIVE
Nitrite, UA: NEGATIVE

## 2012-10-22 LAB — POCT WET PREP (WET MOUNT)

## 2012-10-22 MED ORDER — METRONIDAZOLE 500 MG PO TABS: 500.0000 mg | ORAL_TABLET | Freq: Two times a day (BID) | ORAL | Status: DC

## 2012-10-22 NOTE — Progress Notes (Signed)
ANATOMY SCREEN TWINS, SUB-OPTIMAL IMAGES D/T MORBID OBESITY, DI/DI, TWIN A: VERTEX LIE, ANTERIOR PLAC. GR 0, FHT152/BPM, ACTIVE FEMALE FETUS, SDP 3.8 CM                                                                                                                                           TWIN B:VERTEX, POSTERIOR PLAC. GR 0, FHT 152/BPM, ACTIVE, UNABLE TO DETERMINE GENDER, SDP 3.0 CM

## 2012-10-22 NOTE — Progress Notes (Signed)
C/o lower abdominal pain and yellowish discharge.

## 2012-10-22 NOTE — Patient Instructions (Signed)
Bacterial Vaginosis Bacterial vaginosis (BV) is a vaginal infection where the normal balance of bacteria in the vagina is disrupted. The normal balance is then replaced by an overgrowth of certain bacteria. There are several different kinds of bacteria that can cause BV. BV is the most common vaginal infection in women of childbearing age. CAUSES   The cause of BV is not fully understood. BV develops when there is an increase or imbalance of harmful bacteria.  Some activities or behaviors can upset the normal balance of bacteria in the vagina and put women at increased risk including:  Having a new sex partner or multiple sex partners.  Douching.  Using an intrauterine device (IUD) for contraception.  It is not clear what role sexual activity plays in the development of BV. However, women that have never had sexual intercourse are rarely infected with BV. Women do not get BV from toilet seats, bedding, swimming pools or from touching objects around them.  SYMPTOMS   Grey vaginal discharge.  A fish-like odor with discharge, especially after sexual intercourse.  Itching or burning of the vagina and vulva.  Burning or pain with urination.  Some women have no signs or symptoms at all. DIAGNOSIS  Your caregiver must examine the vagina for signs of BV. Your caregiver will perform lab tests and look at the sample of vaginal fluid through a microscope. They will look for bacteria and abnormal cells (clue cells), a pH test higher than 4.5, and a positive amine test all associated with BV.  RISKS AND COMPLICATIONS   Pelvic inflammatory disease (PID).  Infections following gynecology surgery.  Developing HIV.  Developing herpes virus. TREATMENT  Sometimes BV will clear up without treatment. However, all women with symptoms of BV should be treated to avoid complications, especially if gynecology surgery is planned. Female partners generally do not need to be treated. However, BV may spread  between female sex partners so treatment is helpful in preventing a recurrence of BV.   BV may be treated with antibiotics. The antibiotics come in either pill or vaginal cream forms. Either can be used with nonpregnant or pregnant women, but the recommended dosages differ. These antibiotics are not harmful to the baby.  BV can recur after treatment. If this happens, a second round of antibiotics will often be prescribed.  Treatment is important for pregnant women. If not treated, BV can cause a premature delivery, especially for a pregnant woman who had a premature birth in the past. All pregnant women who have symptoms of BV should be checked and treated.  For chronic reoccurrence of BV, treatment with a type of prescribed gel vaginally twice a week is helpful. HOME CARE INSTRUCTIONS   Finish all medication as directed by your caregiver.  Do not have sex until treatment is completed.  Tell your sexual partner that you have a vaginal infection. They should see their caregiver and be treated if they have problems, such as a mild rash or itching.  Practice safe sex. Use condoms. Only have 1 sex partner. PREVENTION  Basic prevention steps can help reduce the risk of upsetting the natural balance of bacteria in the vagina and developing BV:  Do not have sexual intercourse (be abstinent).  Do not douche.  Use all of the medicine prescribed for treatment of BV, even if the signs and symptoms go away.  Tell your sex partner if you have BV. That way, they can be treated, if needed, to prevent reoccurrence. SEEK MEDICAL CARE IF:     Your symptoms are not improving after 3 days of treatment.  You have increased discharge, pain, or fever. MAKE SURE YOU:   Understand these instructions.  Will watch your condition.  Will get help right away if you are not doing well or get worse. FOR MORE INFORMATION  Division of STD Prevention (DSTDP), Centers for Disease Control and Prevention:  www.cdc.gov/std American Social Health Association (ASHA): www.ashastd.org  Document Released: 01/31/2005 Document Revised: 04/25/2011 Document Reviewed: 07/24/2008 Exit[employer] Patient Information 2014 [employer].  

## 2012-10-22 NOTE — Progress Notes (Signed)
Reports good fm x 2. Denies uc's, lof, vb, urinary frequency, urgency, hesitancy, or dysuria.  Has had yellowish d/c throughout pregnancy, but has gotten worse, denies odor or vulvovaginal itching/irritation. Spec exam: mod amount thin yellowish slightly malodorous d/c, cx visually closed. SVE: LTC, wet prep: BV. Sharp lower abdominal pains at times, especially after being at work.  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 Has noticed legs beginning to swell, developing spider veins. Unable to sit at all at work even w/ a note. Contemplating coming out of work, but wants to wait at least 2 more weeks when fob can return to work. Sees her urologist in [place] (dr. Marchelle Folks) today, states she hasn't seen him yet during pregnancy.  Reviewed today's u/s, warning s/s to report.  Recommended frequent leg movements/stretches while standing at job. Rx metronidazole for bv.  All questions answered. F/U in 4wks for growth u/s, visit.

## 2012-10-24 ENCOUNTER — Inpatient Hospital Stay (HOSPITAL_COMMUNITY)
Admission: AD | Admit: 2012-10-24 | Discharge: 2012-10-24 | Disposition: A | Discharge status: Home / Self Care | Payer: Medicaid Other | Source: Ambulatory Visit | Attending: Obstetrics & Gynecology | Admitting: Obstetrics & Gynecology

## 2012-10-24 ENCOUNTER — Telehealth: Payer: Self-pay | Admitting: Obstetrics & Gynecology

## 2012-10-24 ENCOUNTER — Inpatient Hospital Stay (HOSPITAL_COMMUNITY): Payer: Medicaid Other

## 2012-10-24 ENCOUNTER — Encounter (HOSPITAL_COMMUNITY): Payer: Self-pay | Admitting: *Deleted

## 2012-10-24 DIAGNOSIS — W010XXA Fall on same level from slipping, tripping and stumbling without subsequent striking against object, initial encounter: Secondary | ICD-10-CM | POA: Insufficient documentation

## 2012-10-24 DIAGNOSIS — O30049 Twin pregnancy, dichorionic/diamniotic, unspecified trimester: Secondary | ICD-10-CM

## 2012-10-24 DIAGNOSIS — O368190 Decreased fetal movements, unspecified trimester, not applicable or unspecified: Secondary | ICD-10-CM | POA: Insufficient documentation

## 2012-10-24 DIAGNOSIS — Y9229 Other specified public building as the place of occurrence of the external cause: Secondary | ICD-10-CM | POA: Insufficient documentation

## 2012-10-24 DIAGNOSIS — R109 Unspecified abdominal pain: Secondary | ICD-10-CM

## 2012-10-24 DIAGNOSIS — W19XXXA Unspecified fall, initial encounter: Secondary | ICD-10-CM

## 2012-10-24 DIAGNOSIS — Y9301 Activity, walking, marching and hiking: Secondary | ICD-10-CM | POA: Insufficient documentation

## 2012-10-24 HISTORY — DX: Morbid (severe) obesity due to excess calories: E66.01

## 2012-10-24 LAB — URINE MICROSCOPIC-ADD ON

## 2012-10-24 LAB — URINALYSIS, ROUTINE W REFLEX MICROSCOPIC
Glucose, UA: NEGATIVE mg/dL
Protein, ur: NEGATIVE mg/dL
Urobilinogen, UA: 0.2 mg/dL (ref 0.0–1.0)

## 2012-10-24 NOTE — MAU Note (Signed)
Patient states that she fell last night and hit the right side of her body on concrete patio. States she started having lower abdominal pain during the night. States she has felt no fetal movement today. Patient has twins. Denies bleeding or leaking. Has had increasing swelling in legs and feet.

## 2012-10-24 NOTE — MAU Provider Note (Signed)
Chief Complaint:  Fall, Abdominal Pain and Decreased Fetal Movement   Alice [name] is a 27 y.o.  G2P0101 with IUP at [redacted]w[redacted]d presenting for Fall, Abdominal Pain and Decreased Fetal Movement  Pt states that yesterday she tripped over her feet leaving a restaurant and landed on her right knee and elbow and hit the side of her belly on the concrete.  States that she felt fine last night but then since her fall she has not felt her babies move at all (previously, she was feeling them regularly per her report). Also, this AM she startd noticing some mild cramping like a 4/10 in her lower abdomen.  Given this, she called [facility] and was told to come and be evaluated.   Pt receives [facility] care [facility] at [facility].  It is complicated by morbid obesity.  She is carrying di/di twins.       Menstrual History: OB History   Grav Para Term Preterm Abortions TAB SAB Ect Mult Living   2 1  1      1      G1- delivered at [redacted]w[redacted]d via SVD  Patient's last menstrual period was 06/02/2012.      Past Medical History  Diagnosis Date  . Congenital obstruction of ureteropelvic junction (UPJ)   . History of kidney stones   . 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 History of recurrent UTIs   . Round ligament pain 09/10/2012  . Morbid obesity     Past Surgical History  Procedure Laterality Date  . Nephrectomy      right side  . Nasal sinus surgery    . Tonsillectomy      Family History  Problem Relation Age of Onset  . Hypertension Mother   . Hyperlipidemia Mother   . Heart disease Father     MI  . Alcohol abuse Father   . Alcohol abuse Brother   . Diabetes Maternal Grandmother   . Hyperlipidemia Maternal Grandmother     History  Substance Use Topics  . Smoking status: Former Smoker    Types: Cigarettes  . Smokeless tobacco: Never Used  . Alcohol Use: No      Allergies  Allergen Reactions  . Cinnamon Flavor Swelling    Artificial cinnamon causes tongue to  swell  . Codeine Nausea Only    Straight codeine only  . Ibuprofen Other (See Comments)    Causes severe pain in stomach  . Levofloxacin Hives  . Reglan [Metoclopramide] Other (See Comments)    REACTION: causes skin to "crawl"    Prescriptions prior to admission  Medication Sig Dispense Refill  . acetaminophen (TYLENOL) 500 MG tablet Take 1,000 mg by mouth once.      . butalbital-acetaminophen-caffeine (FIORICET) 50-325-40 MG per tablet Take 1-2 tablets by mouth every 6 (six) hours as needed for headache.  20 tablet  1  . Doxylamine-Pyridoxine (DICLEGIS) 10-10 MG TBEC Take 10 mg by mouth See admin instructions.  100 tablet  4  . nitrofurantoin, macrocrystal-monohydrate, (MACROBID) 100 MG capsule Take 1 capsule (100 mg total) by mouth at bedtime.  30 capsule  9  . omeprazole (PRILOSEC) 20 MG capsule Take 1 capsule (20 mg total) by mouth daily.  30 capsule  11  . Prenatal Vit-Fe Fumarate-FA (PRENATAL MULTIVITAMIN) TABS tablet Take 1 tablet by mouth daily at 12 noon.      . promethazine (PHENERGAN) 25 MG tablet Take 1 tablet (25 mg total) by mouth every 6 (  six) hours as needed for nausea.  30 tablet  1  . metroNIDAZOLE (FLAGYL) 500 MG tablet Take 1 tablet (500 mg total) by mouth 2 (two) times daily. X 7 days  14 tablet  0    Review of Systems - Negative except for what is mentioned in HPI.  Physical Exam  Blood pressure 127/70, pulse 78, resp. rate 20, height 5' 6.5" (1.689 m), weight 161.843 kg (356 lb 12.8 oz), last menstrual period 06/02/2012. GENERAL: Well-developed, well-nourished female in no acute distress. Obese.  LUNGS: Clear to auscultation bilaterally.  HEART: Regular rate and rhythm. ABDOMEN: Soft, nontender, nondistended, gravid.  EXTREMITIES: Nontender, edema 2+ but at pt's baseline, 2+ distal pulses. SKIN: small area of petechia in the middle lower abdomen that does not correlate with the injury that occurred.  FHT:  Unable to doppler.   Contractions: none palpated.  Difficult to trace on TOCO given body habitus.     Labs: Results for orders placed during the hospital encounter of 10/24/12 (from the past 24 hour(s))  URINALYSIS, ROUTINE W REFLEX MICROSCOPIC   Collection Time    10/24/12 11:20 AM      Result Value Range   Color, Urine YELLOW  YELLOW   APPearance CLEAR  CLEAR   Specific Gravity, Urine 1.010  1.005 - 1.030   pH 6.0  5.0 - 8.0   Glucose, UA NEGATIVE  NEGATIVE mg/dL   Hgb urine dipstick NEGATIVE  NEGATIVE   Bilirubin Urine NEGATIVE  NEGATIVE   Ketones, ur NEGATIVE  NEGATIVE mg/dL   Protein, ur NEGATIVE  NEGATIVE mg/dL   Urobilinogen, UA 0.2  0.0 - 1.0 mg/dL   Nitrite NEGATIVE  NEGATIVE   Leukocytes, UA SMALL (*) NEGATIVE  URINE MICROSCOPIC-ADD ON   Collection Time    10/24/12 11:20 AM      Result Value Range   Squamous Epithelial / LPF FEW (*) RARE   WBC, UA 3-6  <3 WBC/hpf   Bacteria, UA RARE  RARE    Imaging Studies:   Assessment: Alice [name] is  27 y.o. G2P0101 at [redacted]w[redacted]d presents with Fall, Abdominal Pain and Decreased Fetal Movement .  Plan: 1) fall- now 15 hours ago - will r/o abruption with [place] given time since injury. nontender on exam.  - no palpable contractions but difficult to trace on TOCO to confirm - pt comfortable overall - [place] without evidence of abruption.  - cervix long and closed on [place] also  - reassurance given - pt more comfortable now   2) decreased FM - FHR A 152, FHR B 139 by [place] - reassurance given that FM can be unpredictable at this GA - no concerning features on [place]  3) increased swelling - discussed this likely a combination of being on her feet for extended periods of time at her job, increased salt in her diet and weight - recommend trial of support stockings - recommend maternity belt also for help with lymphatic decompression.   F/u as scheduled in clinic for routine [facility].    Armstead Heiland L 9/10/201412:13 PM

## 2012-10-24 NOTE — Progress Notes (Signed)
Notes for work given per request of Dr. Reola Calkins.

## 2012-10-24 NOTE — Telephone Encounter (Signed)
Alice [name] spoke with pt and sent her to [facility]. JSY

## 2012-10-29 ENCOUNTER — Encounter: Payer: Self-pay | Admitting: Women's Health

## 2012-10-29 ENCOUNTER — Ambulatory Visit (INDEPENDENT_AMBULATORY_CARE_PROVIDER_SITE_OTHER): Payer: Medicaid Other | Admitting: Women's Health

## 2012-10-29 VITALS — BP 130/80 | Wt 348.8 lb

## 2012-10-29 DIAGNOSIS — Z905 Acquired absence of kidney: Secondary | ICD-10-CM

## 2012-10-29 DIAGNOSIS — Z87440 Personal history of urinary (tract) infections: Secondary | ICD-10-CM

## 2012-10-29 DIAGNOSIS — E669 Obesity, unspecified: Secondary | ICD-10-CM

## 2012-10-29 DIAGNOSIS — Z1389 Encounter for screening for other disorder: Secondary | ICD-10-CM

## 2012-10-29 DIAGNOSIS — O09299 Supervision of pregnancy with other poor reproductive or obstetric history, unspecified trimester: Secondary | ICD-10-CM

## 2012-10-29 DIAGNOSIS — O30009 Twin pregnancy, unspecified number of placenta and unspecified number of amniotic sacs, unspecified trimester: Secondary | ICD-10-CM

## 2012-10-29 DIAGNOSIS — O0992 Supervision of high risk pregnancy, unspecified, second trimester: Secondary | ICD-10-CM

## 2012-10-29 DIAGNOSIS — Z331 Pregnant state, incidental: Secondary | ICD-10-CM

## 2012-10-29 LAB — POCT URINALYSIS DIPSTICK
Blood, UA: NEGATIVE
Ketones, UA: NEGATIVE
Protein, UA: NEGATIVE

## 2012-10-29 NOTE — Progress Notes (Signed)
PT saw urologist on 10/22/12, about urine. Advise pt to follow-up in our office, for WBC of urine.

## 2012-10-29 NOTE — Progress Notes (Signed)
Reports good fm x2. Denies uc's, lof, vb, urinary frequency, urgency, hesitancy, or dysuria.  Saw urologist, bladder only emptying about 50% based on u/s's at his office. He is having her weigh daily, had 12lb fluid retention when working. To come out of work d/t fluid retention per urologist, note given.  Will see him q 2months during pregnancy.  Reviewed warning s/s to report.  Will send ua c&s.  All questions answered. F/U as scheduled.

## 2012-10-30 LAB — URINALYSIS
Bilirubin Urine: NEGATIVE
Glucose, UA: NEGATIVE mg/dL
Hgb urine dipstick: NEGATIVE
Ketones, ur: NEGATIVE mg/dL
Nitrite: NEGATIVE
Protein, ur: NEGATIVE mg/dL
Specific Gravity, Urine: 1.015 (ref 1.005–1.030)
Urobilinogen, UA: 0.2 mg/dL (ref 0.0–1.0)
pH: 7 (ref 5.0–8.0)

## 2012-11-19 ENCOUNTER — Encounter: Payer: Self-pay | Admitting: Women's Health

## 2012-11-19 ENCOUNTER — Other Ambulatory Visit: Payer: Self-pay | Admitting: Women's Health

## 2012-11-19 ENCOUNTER — Telehealth: Payer: Self-pay | Admitting: Women's Health

## 2012-11-19 ENCOUNTER — Ambulatory Visit (INDEPENDENT_AMBULATORY_CARE_PROVIDER_SITE_OTHER): Payer: Medicaid Other

## 2012-11-19 ENCOUNTER — Other Ambulatory Visit: Payer: Self-pay | Admitting: Obstetrics & Gynecology

## 2012-11-19 ENCOUNTER — Ambulatory Visit (INDEPENDENT_AMBULATORY_CARE_PROVIDER_SITE_OTHER): Payer: Medicaid Other | Admitting: Women's Health

## 2012-11-19 VITALS — BP 100/64 | Temp 98.5°F | Wt 355.4 lb

## 2012-11-19 DIAGNOSIS — O30009 Twin pregnancy, unspecified number of placenta and unspecified number of amniotic sacs, unspecified trimester: Secondary | ICD-10-CM

## 2012-11-19 DIAGNOSIS — O0992 Supervision of high risk pregnancy, unspecified, second trimester: Secondary | ICD-10-CM

## 2012-11-19 DIAGNOSIS — J019 Acute sinusitis, unspecified: Secondary | ICD-10-CM

## 2012-11-19 DIAGNOSIS — O30049 Twin pregnancy, dichorionic/diamniotic, unspecified trimester: Secondary | ICD-10-CM

## 2012-11-19 DIAGNOSIS — E669 Obesity, unspecified: Secondary | ICD-10-CM

## 2012-11-19 DIAGNOSIS — O99212 Obesity complicating pregnancy, second trimester: Secondary | ICD-10-CM

## 2012-11-19 DIAGNOSIS — Z905 Acquired absence of kidney: Secondary | ICD-10-CM

## 2012-11-19 DIAGNOSIS — Z331 Pregnant state, incidental: Secondary | ICD-10-CM

## 2012-11-19 DIAGNOSIS — O09299 Supervision of pregnancy with other poor reproductive or obstetric history, unspecified trimester: Secondary | ICD-10-CM

## 2012-11-19 DIAGNOSIS — Z1389 Encounter for screening for other disorder: Secondary | ICD-10-CM

## 2012-11-19 LAB — POCT URINALYSIS DIPSTICK
Blood, UA: NEGATIVE
Glucose, UA: NEGATIVE
Nitrite, UA: NEGATIVE
Protein, UA: NEGATIVE

## 2012-11-19 MED ORDER — CEPHALEXIN 500 MG PO CAPS: 500.0000 mg | ORAL_CAPSULE | Freq: Four times a day (QID) | ORAL | Status: DC

## 2012-11-19 NOTE — Telephone Encounter (Signed)
Spoke with pt. Husband has ringworm. Spoke with Joellyn Haff, CNM. Advised to stay away from the area and good handwashing skills. If pt does get ringworm, advised to call us and let us know. Pt voiced understanding. JSY

## 2012-11-19 NOTE — Progress Notes (Signed)
Fundus measured from U. Reports good fm. Denies uc's, lof, vb, urinary frequency, urgency, hesitancy, or dysuria.  Sinus pain/congestion, greenish nasal drainage, sore throat in AMs x few wks. Denies fever/chills, earaches, chest cold s/s. +tenderness to palpation of ethmoid and maxillary sinuses bilaterally. Rx keflex qid x 7d. Pt didn't want zpak. Reviewed today's u/s, ptl s/s, fm.  All questions answered. F/U in 4wks for pn2-needs repeat CMP to check LFTs as well, growth/afi u/s twins, visit.

## 2012-11-19 NOTE — Progress Notes (Signed)
U/S(24+2wks)-twins, membrane noted, cx=4.1cm closed Baby A-(maternal Rt side)-vtx active fetus, approp growth EFW 1 lb 6 oz (36th%itle), fluid wnl, ant gr 1 plac, female fetus, no obvious abnl noted "Alice [name]" Baby B- (maternal LT side)-vtx active fetus, approp grwoth EFW 1 lb 5 oz (32nd%tile), fluid wnl, post gr1 plac, female fetus, no obvious abnl noted "Alice [name] [employer]

## 2012-11-19 NOTE — Patient Instructions (Signed)

## 2012-11-20 LAB — URINE CULTURE: Organism ID, Bacteria: NO GROWTH

## 2012-12-01 ENCOUNTER — Encounter (HOSPITAL_COMMUNITY): Payer: Self-pay | Admitting: *Deleted

## 2012-12-01 ENCOUNTER — Inpatient Hospital Stay (HOSPITAL_COMMUNITY)
Admit: 2012-12-01 | Discharge: 2012-12-01 | Disposition: A | Discharge status: Home / Self Care | Payer: Medicaid Other | Attending: Family Medicine | Admitting: Family Medicine

## 2012-12-01 ENCOUNTER — Inpatient Hospital Stay (HOSPITAL_COMMUNITY): Payer: Medicaid Other

## 2012-12-01 ENCOUNTER — Inpatient Hospital Stay (HOSPITAL_COMMUNITY)
Admission: AD | Admit: 2012-12-01 | Discharge: 2012-12-02 | Disposition: A | Discharge status: Home / Self Care | Payer: Medicaid Other | Source: Ambulatory Visit | Attending: Family Medicine | Admitting: Family Medicine

## 2012-12-01 DIAGNOSIS — O99891 Other specified diseases and conditions complicating pregnancy: Secondary | ICD-10-CM | POA: Insufficient documentation

## 2012-12-01 DIAGNOSIS — Z87440 Personal history of urinary (tract) infections: Secondary | ICD-10-CM | POA: Insufficient documentation

## 2012-12-01 DIAGNOSIS — O0992 Supervision of high risk pregnancy, unspecified, second trimester: Secondary | ICD-10-CM

## 2012-12-01 DIAGNOSIS — R0602 Shortness of breath: Secondary | ICD-10-CM | POA: Insufficient documentation

## 2012-12-01 DIAGNOSIS — O30049 Twin pregnancy, dichorionic/diamniotic, unspecified trimester: Secondary | ICD-10-CM | POA: Insufficient documentation

## 2012-12-01 DIAGNOSIS — O30009 Twin pregnancy, unspecified number of placenta and unspecified number of amniotic sacs, unspecified trimester: Secondary | ICD-10-CM | POA: Insufficient documentation

## 2012-12-01 DIAGNOSIS — Z905 Acquired absence of kidney: Secondary | ICD-10-CM | POA: Insufficient documentation

## 2012-12-01 LAB — URINALYSIS, ROUTINE W REFLEX MICROSCOPIC
Bilirubin Urine: NEGATIVE
Glucose, UA: NEGATIVE mg/dL
Leukocytes, UA: NEGATIVE
Nitrite: NEGATIVE
Specific Gravity, Urine: 1.025 (ref 1.005–1.030)
pH: 7 (ref 5.0–8.0)

## 2012-12-01 MED ORDER — ALBUTEROL SULFATE (5 MG/ML) 0.5% IN NEBU
INHALATION_SOLUTION | RESPIRATORY_TRACT | Status: AC
  Administered 2012-12-01: 2.5 mg
  Filled 2012-12-01: qty 0.5

## 2012-12-01 NOTE — MAU Provider Note (Signed)
History     CSN: [id number]  Arrival date and time: 12/01/12 2209   None     Chief Complaint  Patient presents with  . Shortness of Breath   HPI Alice [name] is a 27 y.o. G44P0101 female @ [redacted]w[redacted]d w/ di-di twin gestation who presents w/ report of sob, nonproductive cough, and chest tightness that began ~2days ago. SOB has progressed from only w/ exertion to at rest.  Feels like she's unable to get a good deep breath. Treated for URI ~2wks ago, states she got much better until 2d ago. Denies fever/chills, sore throat, HA, body aches, earaches, sinus pain, h/o asthma. Reports good fm x 2, no uc's, lof or vb. Taking prilosec daily for heartburn, hasn't missed any, and states it's been working well. 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   OB History   Grav Para Term Preterm Abortions TAB SAB Ect Mult Living   2 1  1      1       Past Medical History  Diagnosis Date  . Congenital obstruction of ureteropelvic junction (UPJ)   . History of kidney stones   . 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 History of recurrent UTIs   . Round ligament pain 09/10/2012  . Morbid obesity     Past Surgical History  Procedure Laterality Date  . Nephrectomy      right side  . Nasal sinus surgery    . Tonsillectomy    . Other surgical history      ulner nerve removal    Family History  Problem Relation Age of Onset  . Hypertension Mother   . Hyperlipidemia Mother   . Heart disease Father     MI  . Alcohol abuse Father   . Alcohol abuse Brother   . Diabetes Maternal Grandmother   . Hyperlipidemia Maternal Grandmother     History  Substance Use Topics  . Smoking status: Former Smoker    Types: Cigarettes  . Smokeless tobacco: Never Used  . Alcohol Use: No    Allergies:  Allergies  Allergen Reactions  . Cinnamon Flavor Swelling    Artificial cinnamon causes tongue to swell  . Codeine Nausea Only     Straight codeine only  . Ibuprofen Other (See Comments)    Causes severe pain in stomach  . Levofloxacin Hives  . Reglan [Metoclopramide] Other (See Comments)    REACTION: causes skin to "crawl"    Prescriptions prior to admission  Medication Sig Dispense Refill  . cephALEXin (KEFLEX) 500 MG capsule Take 1 capsule (500 mg total) by mouth 4 (four) times daily. X 7 days  28 capsule  0  . Doxylamine-Pyridoxine (DICLEGIS) 10-10 MG TBEC Take 10 mg by mouth See admin instructions.  100 tablet  4  . metroNIDAZOLE (FLAGYL) 500 MG tablet Take 1 tablet (500 mg total) by mouth 2 (two) times daily. X 7 days  14 tablet  0  . nitrofurantoin, macrocrystal-monohydrate, (MACROBID) 100 MG capsule Take 1 capsule (100 mg total) by mouth at bedtime.  30 capsule  9  . omeprazole (PRILOSEC) 20 MG capsule Take 1 capsule (20 mg total) by mouth daily.  30 capsule  11  . acetaminophen (TYLENOL) 500 MG tablet Take 1,000 mg by mouth once.      . butalbital-acetaminophen-caffeine (FIORICET) 50-325-40 MG per tablet Take 1-2 tablets by mouth every 6 (six) hours as needed for headache.  20  tablet  1  . Prenatal Vit-Fe Fumarate-FA (PRENATAL MULTIVITAMIN) TABS tablet Take 1 tablet by mouth daily at 12 noon.      . promethazine (PHENERGAN) 25 MG tablet Take 1 tablet (25 mg total) by mouth every 6 (six) hours as needed for nausea.  30 tablet  1    Review of Systems  Constitutional: Negative for fever and chills.  HENT: Negative.  Negative for congestion, ear pain and sore throat.   Eyes: Negative.   Respiratory: Positive for cough (nonproductive) and shortness of breath. Negative for hemoptysis, sputum production and wheezing.   Cardiovascular: Negative for palpitations and leg swelling. Chest pain: chest tightness.  Gastrointestinal: Negative for nausea, vomiting and abdominal pain.  Genitourinary: Negative.   Musculoskeletal: Negative.   Skin: Negative.   Neurological: Negative.  Negative for headaches.   Endo/Heme/Allergies: Negative.   Psychiatric/Behavioral: Negative.    Physical Exam   Blood pressure 140/80, pulse 97, temperature 97.6 F (36.4 C), resp. rate 24, height 5\' 9"  (1.753 m), weight 162.388 kg (358 lb), last menstrual period 06/02/2012, SpO2 100.00%.  Physical Exam  Constitutional: She is oriented to person, place, and time. She appears well-developed and well-nourished.  HENT:  Head: Normocephalic.  Neck: Normal range of motion.  Cardiovascular: Normal rate and regular rhythm.   Respiratory: Effort normal and breath sounds normal. No respiratory distress. She has no wheezes.  GI: Soft.  gravid  Genitourinary:  deferred  Musculoskeletal: Normal range of motion.  Neurological: She is alert and oriented to person, place, and time. She has normal reflexes.  Skin: Skin is warm and dry.  Psychiatric: She has a normal mood and affect. Her behavior is normal. Judgment and thought content normal.   FHR A & B: 150, mod variability, no decels=Cat I x 2  Very difficult for RN to get babies on EFM d/t maternal body habitus and position, 5 min tracing UCs: none  MAU Course  Procedures  Exam Shielded CXR: FINDINGS: The heart size and mediastinal contours are within normal limits.   Both lungs are clear. The visualized skeletal structures are unremarkable.   IMPRESSION: Negative for pneumonia.   By: Tiburcio Pea M.D.   On: 12/02/2012 00:23 Albuterol nebulizer which helped, sob is better. No coughing since here.  Discussed w/ Dr. Shawnie Pons, no need to r/o PE as pt is not hypoxic, OK to d/c home Assessment and Plan  A:  [redacted]w[redacted]d Di-Di Twin IUP  G2P0101   SOB during pregnancy, r/b albuterol neb, CXR neg  H/O PTB @ 36wks  H/O Rt nephrectomy w/ recurrent UTIs, on macrobid suppression P:  D/C home  Rx albuterol inhaler 2puffs q 4hr prn sob/chest tightness  To return if symptoms worsen  Reviewed ptl s/s, fm  Marge Duncans 12/01/2012, 10:45 PM

## 2012-12-01 NOTE — MAU Note (Signed)
For couple days have been short of breath, esp with activity. Has gradually gotten worse to where now just sitting I'm short of breath. Coughing some and chest tight. No hx asthma

## 2012-12-02 DIAGNOSIS — R0602 Shortness of breath: Secondary | ICD-10-CM

## 2012-12-02 MED ORDER — ALBUTEROL SULFATE HFA 108 (90 BASE) MCG/ACT IN AERS: 2.0000 | INHALATION_SPRAY | Freq: Four times a day (QID) | RESPIRATORY_TRACT | Status: DC | PRN

## 2012-12-02 MED ORDER — ALBUTEROL SULFATE (5 MG/ML) 0.5% IN NEBU: 2.5000 mg | INHALATION_SOLUTION | Freq: Once | RESPIRATORY_TRACT | Status: DC

## 2012-12-02 NOTE — MAU Provider Note (Signed)
Chart reviewed and agree with management and plan.  

## 2012-12-07 ENCOUNTER — Telehealth: Payer: Self-pay | Admitting: Women's Health

## 2012-12-07 NOTE — Telephone Encounter (Signed)
Yes she can use the same cream

## 2012-12-07 NOTE — Telephone Encounter (Signed)
Advised pt she can use the same cream as her boyfriend has. JSY

## 2012-12-07 NOTE — Telephone Encounter (Signed)
Spoke with pt. Boyfriend has ringworm. Now she has it, in genital area, about the size of a nickel. Boyfriend has Clotrimazole cream. Is this safe to take? Cell number is [phone]. Uses [facility] in [place]. Thanks!!!

## 2012-12-17 ENCOUNTER — Other Ambulatory Visit: Payer: Medicaid Other

## 2012-12-17 ENCOUNTER — Other Ambulatory Visit: Payer: Self-pay | Admitting: Obstetrics & Gynecology

## 2012-12-17 ENCOUNTER — Other Ambulatory Visit: Payer: Self-pay | Admitting: Women's Health

## 2012-12-17 ENCOUNTER — Encounter: Payer: Self-pay | Admitting: Obstetrics & Gynecology

## 2012-12-17 ENCOUNTER — Ambulatory Visit (INDEPENDENT_AMBULATORY_CARE_PROVIDER_SITE_OTHER): Payer: Medicaid Other

## 2012-12-17 ENCOUNTER — Ambulatory Visit (INDEPENDENT_AMBULATORY_CARE_PROVIDER_SITE_OTHER): Payer: Medicaid Other | Admitting: Obstetrics & Gynecology

## 2012-12-17 ENCOUNTER — Encounter: Payer: Self-pay | Admitting: Women's Health

## 2012-12-17 VITALS — BP 120/70 | Wt 360.0 lb

## 2012-12-17 DIAGNOSIS — O30009 Twin pregnancy, unspecified number of placenta and unspecified number of amniotic sacs, unspecified trimester: Secondary | ICD-10-CM

## 2012-12-17 DIAGNOSIS — O0992 Supervision of high risk pregnancy, unspecified, second trimester: Secondary | ICD-10-CM

## 2012-12-17 DIAGNOSIS — O30049 Twin pregnancy, dichorionic/diamniotic, unspecified trimester: Secondary | ICD-10-CM

## 2012-12-17 DIAGNOSIS — Z1389 Encounter for screening for other disorder: Secondary | ICD-10-CM

## 2012-12-17 DIAGNOSIS — O09299 Supervision of pregnancy with other poor reproductive or obstetric history, unspecified trimester: Secondary | ICD-10-CM

## 2012-12-17 DIAGNOSIS — Z331 Pregnant state, incidental: Secondary | ICD-10-CM

## 2012-12-17 DIAGNOSIS — E669 Obesity, unspecified: Secondary | ICD-10-CM

## 2012-12-17 DIAGNOSIS — Z3483 Encounter for supervision of other normal pregnancy, third trimester: Secondary | ICD-10-CM

## 2012-12-17 LAB — COMPREHENSIVE METABOLIC PANEL
ALT: 83 U/L — ABNORMAL HIGH (ref 0–35)
Albumin: 3 g/dL — ABNORMAL LOW (ref 3.5–5.2)
Alkaline Phosphatase: 86 U/L (ref 39–117)
CO2: 18 mEq/L — ABNORMAL LOW (ref 19–32)
Calcium: 8.1 mg/dL — ABNORMAL LOW (ref 8.4–10.5)
Creat: 0.62 mg/dL (ref 0.50–1.10)
Potassium: 3.8 mEq/L (ref 3.5–5.3)
Sodium: 136 mEq/L (ref 135–145)
Total Bilirubin: 0.4 mg/dL (ref 0.3–1.2)
Total Protein: 5.5 g/dL — ABNORMAL LOW (ref 6.0–8.3)

## 2012-12-17 LAB — POCT URINALYSIS DIPSTICK
Blood, UA: NEGATIVE
Leukocytes, UA: NEGATIVE
Nitrite, UA: NEGATIVE

## 2012-12-17 LAB — CBC
HCT: 33.8 % — ABNORMAL LOW (ref 36.0–46.0)
Hemoglobin: 11.8 g/dL — ABNORMAL LOW (ref 12.0–15.0)
MCH: 30.6 pg (ref 26.0–34.0)
RBC: 3.85 MIL/uL — ABNORMAL LOW (ref 3.87–5.11)

## 2012-12-17 MED ORDER — PROMETHAZINE HCL 25 MG PO TABS: 25.0000 mg | ORAL_TABLET | Freq: Four times a day (QID) | ORAL | Status: DC | PRN

## 2012-12-17 NOTE — Progress Notes (Signed)
U/S(28+2wks)-twin IUP, active fetus x 2, cx-4.0cm closed, EFW discordance 7% Baby A-(maternal Rt side), vtx, active fetus, fluid wnl single deepest pocket=4.3cm, anterior gr 1 plac, EFW 2 lb 6 oz 38th%tile, female fetus "Alice [name]", FHR-153 bpm Baby B-(maternal Lt side), vtx, active fetus, fluid wnl single deepest pocket=4.7cm, Posterior Gr 1 placenta, EFw 2 lb 4 oz 30th%tile, female fetus "Alice [name]", FHR-153 bpm

## 2012-12-17 NOTE — Addendum Note (Signed)
Addended by: Richardson Chiquito on: 12/17/2012 12:47 PM   Modules accepted: Orders

## 2012-12-17 NOTE — Addendum Note (Signed)
Addended by: [name] on: 12/17/2012 12:45 PM   Modules accepted: Orders

## 2012-12-17 NOTE — Progress Notes (Signed)
BP weight and urine results all reviewed and noted.  Twin gestation sonogram reviewed and report done, good growth concordant Modified activity is discussed with patient and family Patient reports good fetal movement, denies any bleeding and no rupture of membranes symptoms or regular contractions. Patient is without complaints. All questions were answered.

## 2012-12-17 NOTE — Addendum Note (Signed)
Addended by: Richardson Chiquito on: 12/17/2012 11:36 AM   Modules accepted: Orders

## 2012-12-18 LAB — ANTIBODY SCREEN: Antibody Screen: NEGATIVE

## 2012-12-18 LAB — GLUCOSE TOLERANCE, 2 HOURS W/ 1HR
Glucose, 1 hour: 137 mg/dL (ref 70–170)
Glucose, 2 hour: 100 mg/dL (ref 70–139)
Glucose, Fasting: 74 mg/dL (ref 70–99)

## 2012-12-18 LAB — GC/CHLAMYDIA PROBE AMP: GC Probe RNA: NEGATIVE

## 2012-12-31 ENCOUNTER — Encounter: Payer: Medicaid Other | Admitting: Women's Health

## 2013-01-01 ENCOUNTER — Encounter (INDEPENDENT_AMBULATORY_CARE_PROVIDER_SITE_OTHER): Payer: Self-pay

## 2013-01-01 ENCOUNTER — Ambulatory Visit (INDEPENDENT_AMBULATORY_CARE_PROVIDER_SITE_OTHER): Payer: Medicaid Other | Admitting: Women's Health

## 2013-01-01 ENCOUNTER — Encounter: Payer: Self-pay | Admitting: Women's Health

## 2013-01-01 VITALS — BP 112/78 | Wt 361.0 lb

## 2013-01-01 DIAGNOSIS — Z905 Acquired absence of kidney: Secondary | ICD-10-CM

## 2013-01-01 DIAGNOSIS — Z331 Pregnant state, incidental: Secondary | ICD-10-CM

## 2013-01-01 DIAGNOSIS — O30009 Twin pregnancy, unspecified number of placenta and unspecified number of amniotic sacs, unspecified trimester: Secondary | ICD-10-CM

## 2013-01-01 DIAGNOSIS — O30049 Twin pregnancy, dichorionic/diamniotic, unspecified trimester: Secondary | ICD-10-CM

## 2013-01-01 DIAGNOSIS — O09299 Supervision of pregnancy with other poor reproductive or obstetric history, unspecified trimester: Secondary | ICD-10-CM

## 2013-01-01 DIAGNOSIS — Z1389 Encounter for screening for other disorder: Secondary | ICD-10-CM

## 2013-01-01 DIAGNOSIS — Z23 Encounter for immunization: Secondary | ICD-10-CM

## 2013-01-01 DIAGNOSIS — O0993 Supervision of high risk pregnancy, unspecified, third trimester: Secondary | ICD-10-CM

## 2013-01-01 DIAGNOSIS — E669 Obesity, unspecified: Secondary | ICD-10-CM

## 2013-01-01 LAB — POCT URINALYSIS DIPSTICK
Blood, UA: NEGATIVE
Ketones, UA: NEGATIVE
Leukocytes, UA: NEGATIVE
Nitrite, UA: NEGATIVE
Protein, UA: NEGATIVE

## 2013-01-01 MED ORDER — INFLUENZA VAC SPLIT QUAD 0.5 ML IM SUSP
0.5000 mL | Freq: Once | INTRAMUSCULAR | Status: AC
  Administered 2013-01-01: 0.5 mL via INTRAMUSCULAR

## 2013-01-01 NOTE — Progress Notes (Signed)
FH measured from U. Reports good fm. Denies uc's, lof, vb, urinary frequency, urgency, hesitancy, or dysuria.  Not sleeping well at night, reviewed measures to help.  Discussed contraception in depth, she was originally contemplating BTL earlier in pregnancy. 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 Pt is unsure of the name of this disorder, to try to find out. States urologist doesn't need to see her again until 1 month pp. Reviewed pn2 labs, ptl s/s, fkc.  All questions answered. F/U in 2wks for growth u/s and visit, will begin 2x/wk testing that week.

## 2013-01-01 NOTE — Patient Instructions (Addendum)
Third Trimester of Pregnancy The third trimester is from week 29 through week 42, months 7 through 9. The third trimester is a time when the fetus is growing rapidly. At the end of the ninth month, the fetus is about 20 inches in length and weighs 6 10 pounds.  BODY CHANGES Your body goes through many changes during pregnancy. The changes vary from woman to woman.   Your weight will continue to increase. You can expect to gain 25 35 pounds (11 16 kg) by the end of the pregnancy.  You may begin to get stretch marks on your hips, abdomen, and breasts.  You may urinate more often because the fetus is moving lower into your pelvis and pressing on your bladder.  You may develop or continue to have heartburn as a result of your pregnancy.  You may develop constipation because certain hormones are causing the muscles that push waste through your intestines to slow down.  You may develop hemorrhoids or swollen, bulging veins (varicose veins).  You may have pelvic pain because of the weight gain and pregnancy hormones relaxing your joints between the bones in your pelvis. Back aches may result from over exertion of the muscles supporting your posture.  Your breasts will continue to grow and be tender. A yellow discharge may leak from your breasts called colostrum.  Your belly button may stick out.  You may feel short of breath because of your expanding uterus.  You may notice the fetus "dropping," or moving lower in your abdomen.  You may have a bloody mucus discharge. This usually occurs a few days to a week before labor begins.  Your cervix becomes thin and soft (effaced) near your due date. WHAT TO EXPECT AT YOUR PRENATAL EXAMS  You will have prenatal exams every 2 weeks until week 36. Then, you will have weekly prenatal exams. During a routine prenatal visit:  You will be weighed to make sure you and the fetus are growing normally.  Your blood pressure is taken.  Your abdomen will be  measured to track your baby's growth.  The fetal heartbeat will be listened to.  Any test results from the previous visit will be discussed.  You may have a cervical check near your due date to see if you have effaced. At around 36 weeks, your caregiver will check your cervix. At the same time, your caregiver will also perform a test on the secretions of the vaginal tissue. This test is to determine if a type of bacteria, Group B streptococcus, is present. Your caregiver will explain this further. Your caregiver may ask you:  What your birth plan is.  How you are feeling.  If you are feeling the baby move.  If you have had any abnormal symptoms, such as leaking fluid, bleeding, severe headaches, or abdominal cramping.  If you have any questions. Other tests or screenings that may be performed during your third trimester include:  Blood tests that check for low iron levels (anemia).  Fetal testing to check the health, activity level, and growth of the fetus. Testing is done if you have certain medical conditions or if there are problems during the pregnancy. FALSE LABOR You may feel small, irregular contractions that eventually go away. These are called Braxton Hicks contractions, or false labor. Contractions may last for hours, days, or even weeks before true labor sets in. If contractions come at regular intervals, intensify, or become painful, it is best to be seen by your caregiver.  SIGNS OF LABOR   Menstrual-like cramps.  Contractions that are 5 minutes apart or less.  Contractions that start on the top of the uterus and spread down to the lower abdomen and back.  A sense of increased pelvic pressure or back pain.  A watery or bloody mucus discharge that comes from the vagina. If you have any of these signs before the 37th week of pregnancy, call your caregiver right away. You need to go to the hospital to get checked immediately. HOME CARE INSTRUCTIONS   Avoid all  smoking, herbs, alcohol, and unprescribed drugs. These chemicals affect the formation and growth of the baby.  Follow your caregiver's instructions regarding medicine use. There are medicines that are either safe or unsafe to take during pregnancy.  Exercise only as directed by your caregiver. Experiencing uterine cramps is a good sign to stop exercising.  Continue to eat regular, healthy meals.  Wear a good support bra for breast tenderness.  Do not use hot tubs, steam rooms, or saunas.  Wear your seat belt at all times when driving.  Avoid raw meat, uncooked cheese, cat litter boxes, and soil used by cats. These carry germs that can cause birth defects in the baby.  Take your prenatal vitamins.  Try taking a stool softener (if your caregiver approves) if you develop constipation. Eat more high-fiber foods, such as fresh vegetables or fruit and whole grains. Drink plenty of fluids to keep your urine clear or pale yellow.  Take warm sitz baths to soothe any pain or discomfort caused by hemorrhoids. Use hemorrhoid cream if your caregiver approves.  If you develop varicose veins, wear support hose. Elevate your feet for 15 minutes, 3 4 times a day. Limit salt in your diet.  Avoid heavy lifting, wear low heal shoes, and practice good posture.  Rest a lot with your legs elevated if you have leg cramps or low back pain.  Visit your dentist if you have not gone during your pregnancy. Use a soft toothbrush to brush your teeth and be gentle when you floss.  A sexual relationship may be continued unless your caregiver directs you otherwise.  Do not travel far distances unless it is absolutely necessary and only with the approval of your caregiver.  Take prenatal classes to understand, practice, and ask questions about the labor and delivery.  Make a trial run to the hospital.  Pack your hospital bag.  Prepare the baby's nursery.  Continue to go to all your prenatal visits as directed  by your caregiver. SEEK MEDICAL CARE IF:  You are unsure if you are in labor or if your water has broken.  You have dizziness.  You have mild pelvic cramps, pelvic pressure, or nagging pain in your abdominal area.  You have persistent nausea, vomiting, or diarrhea.  You have a bad smelling vaginal discharge.  You have pain with urination. SEEK IMMEDIATE MEDICAL CARE IF:   You have a fever.  You are leaking fluid from your vagina.  You have spotting or bleeding from your vagina.  You have severe abdominal cramping or pain.  You have rapid weight loss or gain.  You have shortness of breath with chest pain.  You notice sudden or extreme swelling of your face, hands, ankles, feet, or legs.  You have not felt your baby move in over an hour.  You have severe headaches that do not go away with medicine.  You have vision changes. Document Released: 01/25/2001 Document Revised: 10/03/2012 Document Reviewed:   04/03/2012 Exit[employer] Patient Information 2014 [place], [place].  Medroxyprogesterone injection [Contraceptive]- Depo What is this medicine? MEDROXYPROGESTERONE (me DROX ee proe JES te rone) contraceptive injections prevent pregnancy. They provide effective birth control for 3 months. Depo-subQ Provera 104 is also used for treating pain related to endometriosis. This medicine may be used for other purposes; ask your health care provider or pharmacist if you have questions. COMMON BRAND NAME(S): Depo-Provera, Depo-subQ Provera 104 What should I tell my health care provider before I take this medicine? They need to know if you have any of these conditions: -frequently drink alcohol -asthma -blood vessel disease or a history of a blood clot in the lungs or legs -bone disease such as osteoporosis -breast cancer -diabetes -eating disorder (anorexia nervosa or bulimia) -high blood pressure -HIV infection or AIDS -kidney disease -liver disease -mental  depression -migraine -seizures (convulsions) -stroke -tobacco smoker -vaginal bleeding -an unusual or allergic reaction to medroxyprogesterone, other hormones, medicines, foods, dyes, or preservatives -pregnant or trying to get pregnant -breast-feeding How should I use this medicine? Depo-Provera Contraceptive injection is given into a muscle. Depo-subQ Provera 104 injection is given under the skin. These injections are given by a health care professional. You must not be pregnant before getting an injection. The injection is usually given during the first 5 days after the start of a menstrual period or 6 weeks after delivery of a baby. Talk to your pediatrician regarding the use of this medicine in children. Special care may be needed. These injections have been used in female children who have started having menstrual periods. Overdosage: If you think you have taken too much of this medicine contact a poison control center or emergency room at once. NOTE: This medicine is only for you. Do not share this medicine with others. What if I miss a dose? Try not to miss a dose. You must get an injection once every 3 months to maintain birth control. If you cannot keep an appointment, call and reschedule it. If you wait longer than 13 weeks between Depo-Provera contraceptive injections or longer than 14 weeks between Depo-subQ Provera 104 injections, you could get pregnant. Use another method for birth control if you miss your appointment. You may also need a pregnancy test before receiving another injection. What may interact with this medicine? Do not take this medicine with any of the following medications: -bosentan This medicine may also interact with the following medications: -aminoglutethimide -antibiotics or medicines for infections, especially rifampin, rifabutin, rifapentine, and griseofulvin -aprepitant -barbiturate medicines such as phenobarbital or  primidone -bexarotene -carbamazepine -medicines for seizures like ethotoin, felbamate, oxcarbazepine, phenytoin, topiramate -modafinil -St. John's wort This list may not describe all possible interactions. Give your health care provider a list of all the medicines, herbs, non-prescription drugs, or dietary supplements you use. Also tell them if you smoke, drink alcohol, or use illegal drugs. Some items may interact with your medicine. What should I watch for while using this medicine? This drug does not protect you against HIV infection (AIDS) or other sexually transmitted diseases. Use of this product may cause you to lose calcium from your bones. Loss of calcium may cause weak bones (osteoporosis). Only use this product for more than 2 years if other forms of birth control are not right for you. The longer you use this product for birth control the more likely you will be at risk for weak bones. Ask your health care professional how you can keep strong bones. You may have a change in  bleeding pattern or irregular periods. Many females stop having periods while taking this drug. If you have received your injections on time, your chance of being pregnant is very low. If you think you may be pregnant, see your health care professional as soon as possible. Tell your health care professional if you want to get pregnant within the next year. The effect of this medicine may last a long time after you get your last injection. What side effects may I notice from receiving this medicine? Side effects that you should report to your doctor or health care professional as soon as possible: -allergic reactions like skin rash, itching or hives, swelling of the face, lips, or tongue -breast tenderness or discharge -breathing problems -changes in vision -depression -feeling faint or lightheaded, falls -fever -pain in the abdomen, chest, groin, or leg -problems with balance, talking, walking -unusually weak  or tired -yellowing of the eyes or skin Side effects that usually do not require medical attention (report to your doctor or health care professional if they continue or are bothersome): -acne -fluid retention and swelling -headache -irregular periods, spotting, or absent periods -temporary pain, itching, or skin reaction at site where injected -weight gain This list may not describe all possible side effects. Call your doctor for medical advice about side effects. You may report side effects to FDA at 1-800-FDA-1088. Where should I keep my medicine? This does not apply. The injection will be given to you by a health care professional. NOTE: This sheet is a summary. It may not cover all possible information. If you have questions about this medicine, talk to your doctor, pharmacist, or health care provider.  2014, [employer] Standard. (2008-02-22 18:37:56)  Norethindrone tablets (contraception)-Micronor What is this medicine? NORETHINDRONE (nor eth IN drone) is an oral contraceptive. The product contains a female hormone known as a progestin. It is used to prevent pregnancy. This medicine may be used for other purposes; ask your health care provider or pharmacist if you have questions. COMMON BRAND NAME(S): Camila, [place]in , [place], [place], [place] , [place], Nor-QD, Nora-BE, Ortho Micronor What should I tell my health care provider before I take this medicine? They need to know if you have any of these conditions: -blood vessel disease or blood clots -breast, cervical, or vaginal cancer -diabetes -heart disease -kidney disease -liver disease -mental depression -migraine -seizures -stroke -vaginal bleeding -an unusual or allergic reaction to norethindrone, other medicines, foods, dyes, or preservatives -pregnant or trying to get pregnant -breast-feeding How should I use this medicine? Take this medicine by mouth with a glass of water. You may take it with or without food.  Follow the directions on the prescription label. Take this medicine at the same time each day and in the order directed on the package. Do not take your medicine more often than directed. Contact your pediatrician regarding the use of this medicine in children. Special care may be needed. This medicine has been used in female children who have started having menstrual periods. A patient package insert for the product will be given with each prescription and refill. Read this sheet carefully each time. The sheet may change frequently. Overdosage: If you think you have taken too much of this medicine contact a poison control center or emergency room at once. NOTE: This medicine is only for you. Do not share this medicine with others. What if I miss a dose? Try not to miss a dose. Every time you miss a dose or take a dose late your  chance of pregnancy increases. When 1 pill is missed (even if only 3 hours late), take the missed pill as soon as possible and continue taking a pill each day at the regular time (use a back up method of birth control for the next 48 hours). If more than 1 dose is missed, use an additional birth control method for the rest of your pill pack until menses occurs. Contact your health care professional if more than 1 dose has been missed. What may interact with this medicine? Do not take this medicine with any of the following medications: -amprenavir or fosamprenavir -bosentan This medicine may also interact with the following medications: -antibiotics or medicines for infections, especially rifampin, rifabutin, rifapentine, and griseofulvin, and possibly penicillins or tetracyclines -aprepitant -barbiturate medicines, such as phenobarbital -carbamazepine -felbamate -modafinil -oxcarbazepine -phenytoin -ritonavir or other medicines for HIV infection or AIDS -St. John's wort -topiramate This list may not describe all possible interactions. Give your health care provider a  list of all the medicines, herbs, non-prescription drugs, or dietary supplements you use. Also tell them if you smoke, drink alcohol, or use illegal drugs. Some items may interact with your medicine. What should I watch for while using this medicine? Visit your doctor or health care professional for regular checks on your progress. You will need a regular breast and pelvic exam and Pap smear while on this medicine. Use an additional method of birth control during the first cycle that you take these tablets. If you have any reason to think you are pregnant, stop taking this medicine right away and contact your doctor or health care professional. If you are taking this medicine for hormone related problems, it may take several cycles of use to see improvement in your condition. This medicine does not protect you against HIV infection (AIDS) or any other sexually transmitted diseases. What side effects may I notice from receiving this medicine? Side effects that you should report to your doctor or health care professional as soon as possible: -breast tenderness or discharge -pain in the abdomen, chest, groin or leg -severe headache -skin rash, itching, or hives -sudden shortness of breath -unusually weak or tired -vision or speech problems -yellowing of skin or eyes Side effects that usually do not require medical attention (report to your doctor or health care professional if they continue or are bothersome): -changes in sexual desire -change in menstrual flow -facial hair growth -fluid retention and swelling -headache -irritability -nausea -weight gain or loss This list may not describe all possible side effects. Call your doctor for medical advice about side effects. You may report side effects to FDA at 1-800-FDA-1088. Where should I keep my medicine? Keep out of the reach of children. Store at room temperature between 15 and 30 degrees C (59 and 86 degrees F). Throw away any unused  medicine after the expiration date. NOTE: This sheet is a summary. It may not cover all possible information. If you have questions about this medicine, talk to your doctor, pharmacist, or health care provider.  2014, [employer] Standard. (2011-10-21 16:41:35)

## 2013-01-02 LAB — URINE CULTURE: Colony Count: NO GROWTH

## 2013-01-14 ENCOUNTER — Other Ambulatory Visit: Payer: Self-pay | Admitting: Women's Health

## 2013-01-14 ENCOUNTER — Ambulatory Visit (INDEPENDENT_AMBULATORY_CARE_PROVIDER_SITE_OTHER): Payer: Medicaid Other

## 2013-01-14 ENCOUNTER — Ambulatory Visit (INDEPENDENT_AMBULATORY_CARE_PROVIDER_SITE_OTHER): Payer: Medicaid Other | Admitting: Obstetrics & Gynecology

## 2013-01-14 ENCOUNTER — Other Ambulatory Visit: Payer: Self-pay | Admitting: Obstetrics & Gynecology

## 2013-01-14 ENCOUNTER — Encounter: Payer: Self-pay | Admitting: Women's Health

## 2013-01-14 ENCOUNTER — Encounter: Payer: Self-pay | Admitting: Obstetrics & Gynecology

## 2013-01-14 VITALS — BP 110/70 | Wt 370.0 lb

## 2013-01-14 DIAGNOSIS — Z905 Acquired absence of kidney: Secondary | ICD-10-CM

## 2013-01-14 DIAGNOSIS — O30009 Twin pregnancy, unspecified number of placenta and unspecified number of amniotic sacs, unspecified trimester: Secondary | ICD-10-CM

## 2013-01-14 DIAGNOSIS — E669 Obesity, unspecified: Secondary | ICD-10-CM

## 2013-01-14 DIAGNOSIS — Z331 Pregnant state, incidental: Secondary | ICD-10-CM

## 2013-01-14 DIAGNOSIS — Z1389 Encounter for screening for other disorder: Secondary | ICD-10-CM

## 2013-01-14 DIAGNOSIS — O09299 Supervision of pregnancy with other poor reproductive or obstetric history, unspecified trimester: Secondary | ICD-10-CM

## 2013-01-14 DIAGNOSIS — O0993 Supervision of high risk pregnancy, unspecified, third trimester: Secondary | ICD-10-CM

## 2013-01-14 LAB — POCT URINALYSIS DIPSTICK
Glucose, UA: NEGATIVE
Nitrite, UA: NEGATIVE
Protein, UA: NEGATIVE

## 2013-01-14 MED ORDER — ZOLPIDEM TARTRATE 10 MG PO TABS: 10.0000 mg | ORAL_TABLET | Freq: Every evening | ORAL | Status: DC | PRN

## 2013-01-14 NOTE — Addendum Note (Signed)
Addended by: Criss Alvine on: 01/14/2013 12:21 PM   Modules accepted: Orders

## 2013-01-14 NOTE — Progress Notes (Signed)
U/S(32+2wks)- twin IUP, cx closed (3.4cm, measured abdominally), EFW discordance 3% Baby A- vtx active fetus, approp growth EFW 3 lb 15 oz (35TH%tile), fluid WNL single deepest pocket=5.5cm, FHR-158 bpm, female fetus "Sophia", BPP 8/8,  anterior Gr 2 placenta Baby B-breech active fetus, approp growthEFW 3 lb 14 oz (32nd%tile), fluid WNL single deepest pocket=3.5cm, FHR-157 bpm, female fetus "Mason", BPP 8/8, posterior gr 2 placenta

## 2013-01-14 NOTE — Progress Notes (Signed)
Sonogram report done.  Normal good concordant growth. BP weight and urine results all reviewed and noted. Patient reports good fetal movement, denies any bleeding and no rupture of membranes symptoms or regular contractions. Patient is without complaints. All questions were answered.  Having sleeping issues, will try ambien 10 qhs to see if helps  Follow up on Thursday for NST

## 2013-01-17 ENCOUNTER — Encounter: Payer: Self-pay | Admitting: Obstetrics & Gynecology

## 2013-01-17 ENCOUNTER — Ambulatory Visit (INDEPENDENT_AMBULATORY_CARE_PROVIDER_SITE_OTHER): Payer: Medicaid Other | Admitting: Obstetrics & Gynecology

## 2013-01-17 VITALS — BP 110/80 | Wt 366.0 lb

## 2013-01-17 DIAGNOSIS — O0993 Supervision of high risk pregnancy, unspecified, third trimester: Secondary | ICD-10-CM

## 2013-01-17 DIAGNOSIS — O09299 Supervision of pregnancy with other poor reproductive or obstetric history, unspecified trimester: Secondary | ICD-10-CM

## 2013-01-17 DIAGNOSIS — Z1389 Encounter for screening for other disorder: Secondary | ICD-10-CM

## 2013-01-17 DIAGNOSIS — O30009 Twin pregnancy, unspecified number of placenta and unspecified number of amniotic sacs, unspecified trimester: Secondary | ICD-10-CM

## 2013-01-17 DIAGNOSIS — E669 Obesity, unspecified: Secondary | ICD-10-CM

## 2013-01-17 DIAGNOSIS — Z331 Pregnant state, incidental: Secondary | ICD-10-CM

## 2013-01-17 LAB — POCT URINALYSIS DIPSTICK

## 2013-01-17 NOTE — Progress Notes (Signed)
Reactive NST x 2 Mucousy discharge No pooling nitrazine negative BP weight and urine results all reviewed and noted. Patient reports good fetal movement, denies any bleeding and no rupture of membranes symptoms or regular contractions. Patient is without complaints. All questions were answered.

## 2013-01-21 ENCOUNTER — Encounter (HOSPITAL_COMMUNITY): Payer: Self-pay | Admitting: *Deleted

## 2013-01-21 ENCOUNTER — Observation Stay (HOSPITAL_COMMUNITY)
Admission: AD | Admit: 2013-01-21 | Discharge: 2013-01-23 | Disposition: A | Discharge status: Home / Self Care | Payer: Medicaid Other | Source: Ambulatory Visit | Attending: Obstetrics & Gynecology | Admitting: Obstetrics & Gynecology

## 2013-01-21 ENCOUNTER — Inpatient Hospital Stay (HOSPITAL_COMMUNITY): Payer: Medicaid Other

## 2013-01-21 ENCOUNTER — Encounter: Payer: Self-pay | Admitting: Women's Health

## 2013-01-21 ENCOUNTER — Other Ambulatory Visit: Payer: Self-pay | Admitting: Obstetrics & Gynecology

## 2013-01-21 ENCOUNTER — Ambulatory Visit (INDEPENDENT_AMBULATORY_CARE_PROVIDER_SITE_OTHER): Payer: Medicaid Other

## 2013-01-21 ENCOUNTER — Ambulatory Visit (INDEPENDENT_AMBULATORY_CARE_PROVIDER_SITE_OTHER): Payer: Medicaid Other | Admitting: Women's Health

## 2013-01-21 VITALS — BP 120/76 | Wt 366.0 lb

## 2013-01-21 DIAGNOSIS — E669 Obesity, unspecified: Secondary | ICD-10-CM | POA: Insufficient documentation

## 2013-01-21 DIAGNOSIS — Z87440 Personal history of urinary (tract) infections: Secondary | ICD-10-CM | POA: Insufficient documentation

## 2013-01-21 DIAGNOSIS — Z6841 Body Mass Index (BMI) 40.0 and over, adult: Secondary | ICD-10-CM | POA: Insufficient documentation

## 2013-01-21 DIAGNOSIS — O4700 False labor before 37 completed weeks of gestation, unspecified trimester: Secondary | ICD-10-CM | POA: Insufficient documentation

## 2013-01-21 DIAGNOSIS — O30009 Twin pregnancy, unspecified number of placenta and unspecified number of amniotic sacs, unspecified trimester: Secondary | ICD-10-CM

## 2013-01-21 DIAGNOSIS — Z1389 Encounter for screening for other disorder: Secondary | ICD-10-CM

## 2013-01-21 DIAGNOSIS — Z331 Pregnant state, incidental: Secondary | ICD-10-CM

## 2013-01-21 DIAGNOSIS — O4690 Antepartum hemorrhage, unspecified, unspecified trimester: Principal | ICD-10-CM | POA: Insufficient documentation

## 2013-01-21 DIAGNOSIS — O0993 Supervision of high risk pregnancy, unspecified, third trimester: Secondary | ICD-10-CM

## 2013-01-21 DIAGNOSIS — O479 False labor, unspecified: Secondary | ICD-10-CM

## 2013-01-21 DIAGNOSIS — O09299 Supervision of pregnancy with other poor reproductive or obstetric history, unspecified trimester: Secondary | ICD-10-CM

## 2013-01-21 DIAGNOSIS — O30049 Twin pregnancy, dichorionic/diamniotic, unspecified trimester: Secondary | ICD-10-CM

## 2013-01-21 DIAGNOSIS — O4703 False labor before 37 completed weeks of gestation, third trimester: Secondary | ICD-10-CM

## 2013-01-21 DIAGNOSIS — Z905 Acquired absence of kidney: Secondary | ICD-10-CM

## 2013-01-21 DIAGNOSIS — O2390 Unspecified genitourinary tract infection in pregnancy, unspecified trimester: Secondary | ICD-10-CM

## 2013-01-21 DIAGNOSIS — O0990 Supervision of high risk pregnancy, unspecified, unspecified trimester: Secondary | ICD-10-CM | POA: Insufficient documentation

## 2013-01-21 DIAGNOSIS — O4693 Antepartum hemorrhage, unspecified, third trimester: Secondary | ICD-10-CM

## 2013-01-21 LAB — URINE MICROSCOPIC-ADD ON

## 2013-01-21 LAB — URINALYSIS, ROUTINE W REFLEX MICROSCOPIC
Bilirubin Urine: NEGATIVE
Ketones, ur: NEGATIVE mg/dL
Nitrite: NEGATIVE
Protein, ur: NEGATIVE mg/dL
Specific Gravity, Urine: 1.02 (ref 1.005–1.030)
Urobilinogen, UA: 0.2 mg/dL (ref 0.0–1.0)
pH: 6 (ref 5.0–8.0)

## 2013-01-21 LAB — POCT WET PREP (WET MOUNT): Clue Cells Wet Prep Whiff POC: NEGATIVE

## 2013-01-21 LAB — POCT URINALYSIS DIPSTICK
Blood, UA: NEGATIVE
Ketones, UA: NEGATIVE
Nitrite, UA: NEGATIVE
Protein, UA: NEGATIVE

## 2013-01-21 LAB — POCT FERN TEST: POCT Fern Test: NEGATIVE

## 2013-01-21 MED ORDER — BETAMETHASONE SOD PHOS & ACET 6 (3-3) MG/ML IJ SUSP
12.0000 mg | Freq: Once | INTRAMUSCULAR | Status: AC
  Administered 2013-01-22: 12 mg via INTRAMUSCULAR
  Filled 2013-01-21: qty 2

## 2013-01-21 NOTE — MAU Provider Note (Signed)
Chief Complaint:  Contractions and Vaginal Bleeding  First Provider Initiated Contact with Patient 01/21/13 2208     HPI: Alice GAILLARD is a 27 y.o. G2P0101 at [redacted]w[redacted]d who presents to maternity admissions reporting leaking watery, bloody fluid and having UC's Q20 minutes this evening. States she saw a small amount of bright red blood on her panty liner and soon afterward saturated the panty liner with pink watery fluid. Does not think she lost bladder control. Good fetal movement. [facility] with concerns of pelvic pressure. Stated she felt the way she did shortly before going into labor with her first child. Cervix was fingertip and long. Fetal fibronectin negative. GBS, gonorrhea, chlamydia cultures and wet prep collected, pending. No leaking or bleeding at that time.  Pregnancy Course: Complicated by diamniotic dichorionic twins, history of right nephrectomy, unspecified tissue overgrowth disorder, morbid obesity, recurrent UTIs and history of preterm birth.  Past Medical History: Past Medical History  Diagnosis Date  . Congenital obstruction of ureteropelvic junction (UPJ)   . History of kidney stones   . 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 History of recurrent UTIs   . Round ligament pain 09/10/2012  . Morbid obesity     Past obstetric history: OB History  Gravida Para Term Preterm AB SAB TAB Ectopic Multiple Living  2 1  1      1     # Outcome Date GA Lbr Len/2nd Weight Sex Delivery Anes PTL Lv  2 CUR           1 PRE 2012 [redacted]w[redacted]d  2.92 kg (6 lb 7 oz) M SVD EPI N Y      Past Surgical History: Past Surgical History  Procedure Laterality Date  . Nephrectomy      right side  . Nasal sinus surgery    . Tonsillectomy    . Other surgical history      ulner nerve removal  . Addenoidectomy       Family History: Family History  Problem Relation Age of Onset  . Hypertension Mother   . Hyperlipidemia Mother   . Heart  disease Father     MI  . Alcohol abuse Father   . Alcohol abuse Brother   . Diabetes Maternal Grandmother   . Hyperlipidemia Maternal Grandmother     Social History: History  Substance Use Topics  . Smoking status: Former Smoker    Types: Cigarettes    Quit date: 06/21/2012  . Smokeless tobacco: Never Used  . Alcohol Use: No    Allergies:  Allergies  Allergen Reactions  . Cinnamon Flavor Swelling    Artificial cinnamon causes tongue to swell  . Codeine Nausea Only    Straight codeine only  . Ibuprofen Other (See Comments)    Causes severe pain in stomach  . Levofloxacin Hives  . Reglan [Metoclopramide] Other (See Comments)    REACTION: causes skin to "crawl"    Meds:  Prescriptions prior to admission  Medication Sig Dispense Refill  . albuterol (PROVENTIL HFA;VENTOLIN HFA) 108 (90 BASE) MCG/ACT inhaler Inhale 2 puffs into the lungs every 6 (six) hours as needed for shortness of breath.  1 Inhaler  0  . butalbital-acetaminophen-caffeine (FIORICET) 50-325-40 MG per tablet Take 1-2 tablets by mouth every 6 (six) hours as needed for headache.  20 tablet  1  . Doxylamine-Pyridoxine 10-10 MG TBEC Take 20 mg by mouth at bedtime.      Marland Kitchen  nitrofurantoin, macrocrystal-monohydrate, (MACROBID) 100 MG capsule Take 1 capsule (100 mg total) by mouth at bedtime.  30 capsule  9  . omeprazole (PRILOSEC) 20 MG capsule Take 20 mg by mouth at bedtime.      . Prenatal Vit-Fe Fumarate-FA (PRENATAL MULTIVITAMIN) TABS tablet Take 1 tablet by mouth at bedtime.       . promethazine (PHENERGAN) 25 MG tablet Take 1 tablet (25 mg total) by mouth every 6 (six) hours as needed for nausea.  20 tablet  2  . zolpidem (AMBIEN) 10 MG tablet Take 5-10 mg by mouth at bedtime as needed for sleep.      . [DISCONTINUED] Doxylamine-Pyridoxine (DICLEGIS) 10-10 MG TBEC Take 10 mg by mouth See admin instructions.  100 tablet  4  . [DISCONTINUED] omeprazole (PRILOSEC) 20 MG capsule Take 1 capsule (20 mg total) by mouth  daily.  30 capsule  11  . [DISCONTINUED] zolpidem (AMBIEN) 10 MG tablet Take 1 tablet (10 mg total) by mouth at bedtime as needed for sleep.  15 tablet  3    ROS: Pertinent positives findings in history of present illness. Negative for fever, chills, urinary complaints, constant abdominal pain or GI complaints.  Physical Exam  Blood pressure 114/87, pulse 82, temperature 98.9 F (37.2 C), temperature source Oral, resp. rate 16, height 5\' 9"  (1.753 m), weight 168.284 kg (371 lb), last menstrual period 06/02/2012, SpO2 99.00%. GENERAL: Well-developed, well-nourished obese female in mild distress.  HEENT: normocephalic HEART: normal rate. No murmurs rubs or gallops. RESP: normal effort, clear to auscultation bilaterally. ABDOMEN: Soft, non-tender, gravid, size greater than dates. EXTREMITIES: Nontender, no edema NEURO: alert and oriented SPECULUM EXAM: NEFG, small amount of bright red blood mixed with clear mucus. Negative pooling. cervix clean. Dilation: 1 Effacement (%): Thick Cervical Position: Posterior Station: -3 Exam by:: V. Ryver Poblete CNM; sm amt vag bleeding  FHT:  Baseline 140/140 , moderate variability, accelerations present, no decelerations Contractions: UI. Uterus palpates soft.   Labs: Results for orders placed during the hospital encounter of 01/21/13 (from the past 24 hour(s))  URINALYSIS, ROUTINE W REFLEX MICROSCOPIC     Status: Abnormal   Collection Time    01/21/13  9:10 PM      Result Value Range   Color, Urine YELLOW  YELLOW   APPearance CLOUDY (*) CLEAR   Specific Gravity, Urine 1.020  1.005 - 1.030   pH 6.0  5.0 - 8.0   Glucose, UA NEGATIVE  NEGATIVE mg/dL   Hgb urine dipstick LARGE (*) NEGATIVE   Bilirubin Urine NEGATIVE  NEGATIVE   Ketones, ur NEGATIVE  NEGATIVE mg/dL   Protein, ur NEGATIVE  NEGATIVE mg/dL   Urobilinogen, UA 0.2  0.0 - 1.0 mg/dL   Nitrite NEGATIVE  NEGATIVE   Leukocytes, UA SMALL (*) NEGATIVE  URINE MICROSCOPIC-ADD ON     Status: Abnormal    Collection Time    01/21/13  9:10 PM      Result Value Range   Squamous Epithelial / LPF MANY (*) RARE   WBC, UA 7-10  <3 WBC/hpf   RBC / HPF 3-6  <3 RBC/hpf   Bacteria, UA RARE  RARE   Crystals CA OXALATE CRYSTALS (*) NEGATIVE   Urine-Other AMORPHOUS URATES/PHOSPHATES    POCT FERN TEST     Status: None   Collection Time    01/21/13 10:27 PM      Result Value Range   POCT Fern Test Negative = intact amniotic membranes    CBC  Status: Abnormal   Collection Time    01/22/13  1:10 AM      Result Value Range   WBC 11.2 (*) 4.0 - 10.5 K/uL   RBC 4.08  3.87 - 5.11 MIL/uL   Hemoglobin 12.2  12.0 - 15.0 g/dL   HCT 45.4 (*) 09.8 - 11.9 %   MCV 87.3  78.0 - 100.0 fL   MCH 29.9  26.0 - 34.0 pg   MCHC 34.3  30.0 - 36.0 g/dL   RDW 14.7  82.9 - 56.2 %   Platelets 214  150 - 400 K/uL    Imaging:  01/21/2013 p.m.: OB limited.  No evidence of abruption. Exam severely limited by body habitus. Presenting fetus is vertex (formerly baby B). Second fetus is breech. Largest single fluid pocket on presenting fetus is 4 cm. Largest single fluid pocket on second fetus is 4.9 cm. Consistent with exam this morning.  12/1 Growth . 3 % discordance. S=D.  [place] Ua Doppler Add'l Gest Re-eval  01/21/2013   TWINS FOLLOW UP SONOGRAM   [place] is in the office for a follow up sonogram of a twin  gestation.  She is a 27 y.o. year old G73P0101 with Estimated Date of Delivery: 03/09/13   now at  [redacted]w[redacted]d weeks gestation. Thus far the pregnancy has been otherwise  complicated by poor ob hx, morbid obesity.   The twins are dichorionic/diamniotic.   TWIN A GESTATION:   PRESENTATION: breech frank  FETAL ACTIVITY:          Heart rate         143 bpm          The fetus is active.  AMNIOTIC FLUID: The amniotic fluid volume is  normal, 4.7 cm single deepest pocket.  CERVIX: Not visualized  ADNEXA: The adnexa appears normal.   GESTATIONAL AGE AND  BIOMETRICS:  Gestational criteria: Estimated Date of Delivery: 03/09/13   now at [redacted]w[redacted]d  BIOPHYSCIAL PROFILE:                                                                                                       COMMENTS GROSS BODY MOVEMENT                 2   TONE                2   RESPIRATIONS                2   AMNIOTIC FLUID                2                                                            SCORE:  8/8 (Note: NST was not performed as part of this antepartum testing)   DOPPLER FLOW STUDIES: UMBILICAL ARTERY  RI RATIOS:   0.57    SUSPECTED ABNORMALITIES:  no  QUALITY OF SCAN: Sub-optimal due to maternal body habitus    TWIN B  GESTATION:  PRESENTATION: cephalic  FETAL ACTIVITY:          Heart rate         142 bpm          The fetus is active.  AMNIOTIC FLUID: The amniotic fluid volume is  normal, 2.7 cm single deepest pocket.  GESTATIONAL AGE AND  BIOMETRICS:  Gestational criteria: Estimated Date of Delivery: 03/09/13  now at [redacted]w[redacted]d  BIOPHYSCIAL PROFILE:                                                                                                       COMMENTS GROSS BODY MOVEMENT                 2   TONE                2   RESPIRATIONS                2   AMNIOTIC FLUID                2                                                            SCORE:  8/8 (Note: NST was not performed as part of this antepartum testing)   DOPPLER FLOW STUDIES: UMBILICAL ARTERY RI RATIOS:   0.64   SUSPECTED ABNORMALITIES:  no  QUALITY OF SCAN: Sub-optimal due to maternal body habitus  TECHNICIAN COMMENTS:    U/S(33+2wks)-twin IUP, membrane noted Baby A-breech, active fetus BPP 8/8, fluid appears WNL, single deepest  pocket=4.7cm, UA Doppler RI-57, "Alice [name]" Baby B-vtx active fetus BPP 8/8, fluid appears WNL single deepest  pocket=2.7cm, UA Doppler RI-0.64, "Alice [name]"      A copy of this report including all images has been saved and backed up to  a second source for retrieval if needed. All measures and details of the  anatomical scan, placentation, fluid volume and pelvic anatomy are  contained in  that report.  Chari Manning 01/21/2013 11:36 AM    MAU Course: OB Limited, Fern, betamethasone.  Assessment: 1. Vaginal bleeding in pregnancy, third trimester   2. Supervision of high-risk pregnancy, third trimester   3. Twin pregnancy, antepartum   4. Obesity, morbid, BMI 50 or higher   5. H/O preterm delivery, currently pregnant, first trimester   6. Preterm contractions, third trimester    Plan: 23 hour observation on antenatal per consult with Dr. Debroah Loop. Repeat betamethasone in 24 hours. IV fluids. NST every shift. Continues toco. Cath UA due to contaminated clean-catch specimen, preterm contractions and history of recurrent UTI and congenital urinary tract abnormalities.  [place], CNM 01/21/2013 10:09 PM

## 2013-01-21 NOTE — Progress Notes (Addendum)
U/S(33+2wks)-twin IUP, membrane noted Baby A-breech, active fetus BPP 8/8, fluid appears WNL, single deepest pocket=4.7cm, UA Doppler RI-57, "Alice [name]" Baby B-vtx active fetus BPP 8/8, fluid appears WNL single deepest pocket=2.7cm, UA Doppler RI-0.64, "Alice [name]

## 2013-01-21 NOTE — Patient Instructions (Signed)
Preterm Labor Information Preterm labor is when labor starts at less than 37 weeks of pregnancy. The normal length of a pregnancy is 39 to 41 weeks. CAUSES Often, there is no identifiable underlying cause as to why a woman goes into preterm labor. One of the most common known causes of preterm labor is infection. Infections of the uterus, cervix, vagina, amniotic sac, bladder, kidney, or even the lungs (pneumonia) can cause labor to start. Other suspected causes of preterm labor include:   Urogenital infections, such as yeast infections and bacterial vaginosis.   Uterine abnormalities (uterine shape, uterine septum, fibroids, or bleeding from the placenta).   A cervix that has been operated on (it may fail to stay closed).   Malformations in the fetus.   Multiple gestations (twins, triplets, and so on).   Breakage of the amniotic sac.  RISK FACTORS  Having a previous history of preterm labor.   Having premature rupture of membranes (PROM).   Having a placenta that covers the opening of the cervix (placenta previa).   Having a placenta that separates from the uterus (placental abruption).   Having a cervix that is too weak to hold the fetus in the uterus (incompetent cervix).   Having too much fluid in the amniotic sac (polyhydramnios).   Taking illegal drugs or smoking while pregnant.   Not gaining enough weight while pregnant.   Being younger than 70 and older than 28 years old.   Having a low socioeconomic status.   Being African American. SYMPTOMS Signs and symptoms of preterm labor include:   Menstrual-like cramps, abdominal pain, or back pain.  Uterine contractions that are regular, as frequent as six in an hour, regardless of their intensity (may be mild or painful).  Contractions that start on the top of the uterus and spread down to the lower abdomen and back.   A sense of increased pelvic pressure.   A watery or bloody mucus discharge that  comes from the vagina.  TREATMENT Depending on the length of the pregnancy and other circumstances, your health care provider may suggest bed rest. If necessary, there are medicines that can be given to stop contractions and to mature the fetal lungs. If labor happens before 34 weeks of pregnancy, a prolonged hospital stay may be recommended. Treatment depends on the condition of both you and the fetus.  WHAT SHOULD YOU DO IF YOU THINK YOU ARE IN PRETERM LABOR? Call your health care provider right away. You will need to go to the hospital to get checked immediately. HOW CAN YOU PREVENT PRETERM LABOR IN FUTURE PREGNANCIES? You should:   Stop smoking if you smoke.  Maintain healthy weight gain and avoid chemicals and drugs that are not necessary.  Be watchful for any type of infection.  Inform your health care provider if you have a known history of preterm labor. Document Released: 04/23/2003 Document Revised: 10/03/2012 Document Reviewed: 03/05/2012 [facility] Patient Information 2014 [place], [place].   Cesarean Delivery  Cesarean delivery is the birth of a baby through a cut (incision) in the abdomen and womb (uterus).  LET YOUR CAREGIVER KNOW ABOUT:  Complicationsinvolving the pregnancy.  Allergies.  Medicines taken including herbs, eyedrops, over-the-counter medicines, and creams.  Use of steroids (by mouth or creams).  Previous problems with anesthetics or numbing medicine.  Previous surgery.  History of blood clots.  History of bleeding or blood problems.  Other health problems. RISKS AND COMPLICATIONS   Bleeding.  Infection.  Blood clots.  Injury to  surrounding organs.  Anesthesia problems.  Injury to the baby. BEFORE THE PROCEDURE   A tube (Foley catheter) will be placed in your bladder. The Foley catheter drains the urine from your bladder into a bag. This keeps your bladder empty during surgery.  An intravenous access tube (IV) will be placed in your  arm.  Hair may be removed from your pubic area and your lower abdomen. This is to prevent infection in the incision site.  You may be given an antacid medicine to drink. This will prevent acid contents in your stomach from going into your lungs if you vomit during the surgery.  You may be given an antibiotic medicine to prevent infection. PROCEDURE   You may be given medicine to numb the lower half of your body (regional anesthetic). If you were in labor, you may have already had an epidural in place which can be used in both labor and cesarean delivery. You may possibly be given medicine to make you sleep (general anesthetic) though this is not as common.  An incision will be made in your abdomen that extends to your uterus. There are 2 basic kinds of incisions:  The horizontal (transverse) incision. Horizontal incisions are used for most routine cesarean deliveries.  The vertical (up and down) incision. This is less commonly used. This is most often reserved for women who have a serious complication (extreme prematurity) or under emergency situations.  The horizontal and vertical incisions may both be used at the same time. However, this is very uncommon.  Your baby will then be delivered. AFTER THE PROCEDURE   If you were awake during the surgery, you will see your baby right away. If you were asleep, you will see your baby as soon as you are awake.  You may breastfeed your baby after surgery.  You may be able to get up and walk the same day as the surgery. If you need to stay in bed for a period of time, you will receive help to turn, cough, and take deep breaths after surgery. This helps prevent lung problems such as pneumonia.  Do not get out of bed alone the first time after surgery. You will need help getting out of bed until you are able to do this by yourself.  You may be able to shower the day after your cesarean delivery. After the bandage (dressing) is taken off the  incision site, a nurse will assist you to shower, if you like.  You will have pneumatic compressing hose placed on your feet or lower legs. These hose are used to prevent blood clots. When you are up and walking regularly, they will no longer be necessary.  Do not cross your legs when you sit.  Save any blood clots that you pass. If you pass a clot while on the toilet, do not flush it. Call for the nurse. Tell the nurse if you think you are bleeding too much or passing too many clots.  Start drinking liquids and eating food as directed by your caregiver. If your stomach is not ready, drinking and eating too soon can cause an increase in bloating and swelling of your intestine and abdomen. This is very uncomfortable.  You will be given medicine as needed. Let your caregivers know if you are hurting. They want you to be comfortable. You may also be given an antibiotic to prevent an infection.  Your IV will be taken out when you are drinking a reasonable amount of fluids. The  Foley catheter is taken out when you are up and walking.  If your blood type is Rh negative and your baby's blood type is Rh positive, you will be given a shot of anti-D immune globulin. This shot prevents you from having Rh problems with a future pregnancy. You should get the shot even if you had your tubes tied (tubal ligation).  If you are allowed to take the baby for a walk, place the baby in the bassinet and push it. Do not carry your baby in your arms. Document Released: 01/31/2005 Document Revised: 04/25/2011 Document Reviewed: 08/22/2012 [facility] Patient Information 2014 [place], [place].

## 2013-01-21 NOTE — Progress Notes (Signed)
FH measured from U. Reports good fm x 2. Denies lof, urinary frequency, urgency, hesitancy, or dysuria, abnormal/malodorous d/c or vulvovaginal itching/irritation.  Regular painful uc's x ~3hrs yesterday, subsided into occ cramps, w/ lots of pressure. Passed clump of white d/c w/ pink swirled in yesterday, none since. Last sexual intercourse ~2wks ago. States last pregnancy she felt pressure like this, was seen in office, and water broke next day at 36wks. Spec exam: cx visually closed, mod amount creamy white nonodorous d/c, fFN, wet prep, GBS collected. SVE: ft/th/-3, soft. Wet prep neg. Discussed w/ LHE, no change in management at this time.  Pt states per u/s today Baby A/female has moved up and is breech, Baby B/female is now presenting and is vtx. Reviewed ptl s/s, fkc, to stay well hydrated, empty bladder frequently, lie on sides, rest, no sex.  To go to [facility] if uc's return, srom, vb, or if concerned. Will contact her if fFN pos to receive bmz. Pt will try to find out what the name of her genetic mutation/tissue condition is called and let us know. All questions answered. F/U in 3d for nst and visit.

## 2013-01-21 NOTE — Progress Notes (Signed)
Pt states that she had regular contractions for about 3 hours yesterday and then they turned into cramping, pt also stated that she had some pinkish discharge yesterday as well.

## 2013-01-21 NOTE — MAU Note (Signed)
Pt reports she was in the office today, was having contractions then, now is having a watery bloody discharge and is still having contractions. Twins.

## 2013-01-21 NOTE — Addendum Note (Signed)
Addended by: [name] on: 01/21/2013 03:49 PM   Modules accepted: Orders

## 2013-01-22 DIAGNOSIS — O4690 Antepartum hemorrhage, unspecified, unspecified trimester: Secondary | ICD-10-CM | POA: Diagnosis present

## 2013-01-22 DIAGNOSIS — O99210 Obesity complicating pregnancy, unspecified trimester: Secondary | ICD-10-CM

## 2013-01-22 DIAGNOSIS — O4700 False labor before 37 completed weeks of gestation, unspecified trimester: Secondary | ICD-10-CM

## 2013-01-22 DIAGNOSIS — O30009 Twin pregnancy, unspecified number of placenta and unspecified number of amniotic sacs, unspecified trimester: Secondary | ICD-10-CM

## 2013-01-22 DIAGNOSIS — E669 Obesity, unspecified: Secondary | ICD-10-CM

## 2013-01-22 LAB — CBC
HCT: 35.6 % — ABNORMAL LOW (ref 36.0–46.0)
Hemoglobin: 12.2 g/dL (ref 12.0–15.0)
MCH: 29.9 pg (ref 26.0–34.0)
MCHC: 34.3 g/dL (ref 30.0–36.0)
MCV: 87.3 fL (ref 78.0–100.0)
RDW: 13.4 % (ref 11.5–15.5)

## 2013-01-22 LAB — RPR: RPR Ser Ql: NONREACTIVE

## 2013-01-22 LAB — GC/CHLAMYDIA PROBE AMP
CT Probe RNA: NEGATIVE
GC Probe RNA: NEGATIVE

## 2013-01-22 MED ORDER — PRENATAL MULTIVITAMIN CH
1.0000 | ORAL_TABLET | Freq: Every day | ORAL | Status: DC
  Administered 2013-01-22: 1 via ORAL
  Filled 2013-01-22: qty 1

## 2013-01-22 MED ORDER — LACTATED RINGERS IV BOLUS (SEPSIS)
1000.0000 mL | Freq: Once | INTRAVENOUS | Status: AC
  Administered 2013-01-22: 1000 mL via INTRAVENOUS

## 2013-01-22 MED ORDER — LACTATED RINGERS IV SOLN
INTRAVENOUS | Status: DC
  Administered 2013-01-22 – 2013-01-23 (×4): via INTRAVENOUS

## 2013-01-22 MED ORDER — NITROFURANTOIN MONOHYD MACRO 100 MG PO CAPS
100.0000 mg | ORAL_CAPSULE | Freq: Every day | ORAL | Status: DC
  Administered 2013-01-22: 100 mg via ORAL
  Filled 2013-01-22: qty 1

## 2013-01-22 MED ORDER — PANTOPRAZOLE SODIUM 40 MG PO TBEC
40.0000 mg | DELAYED_RELEASE_TABLET | Freq: Every day | ORAL | Status: DC
  Administered 2013-01-22: 40 mg via ORAL
  Filled 2013-01-22 (×2): qty 1

## 2013-01-22 MED ORDER — BETAMETHASONE SOD PHOS & ACET 6 (3-3) MG/ML IJ SUSP
12.0000 mg | Freq: Once | INTRAMUSCULAR | Status: AC
  Administered 2013-01-23: 12 mg via INTRAMUSCULAR
  Filled 2013-01-22: qty 2

## 2013-01-22 MED ORDER — ZOLPIDEM TARTRATE 5 MG PO TABS
5.0000 mg | ORAL_TABLET | Freq: Every evening | ORAL | Status: DC | PRN
  Administered 2013-01-22 (×2): 5 mg via ORAL
  Filled 2013-01-22 (×2): qty 1

## 2013-01-22 MED ORDER — ACETAMINOPHEN 325 MG PO TABS
650.0000 mg | ORAL_TABLET | ORAL | Status: DC | PRN
  Administered 2013-01-22: 650 mg via ORAL
  Filled 2013-01-22: qty 2

## 2013-01-22 MED ORDER — DOCUSATE SODIUM 100 MG PO CAPS
100.0000 mg | ORAL_CAPSULE | Freq: Every day | ORAL | Status: DC
  Administered 2013-01-22: 100 mg via ORAL
  Filled 2013-01-22: qty 1

## 2013-01-22 MED ORDER — CALCIUM CARBONATE ANTACID 500 MG PO CHEW: 2.0000 | CHEWABLE_TABLET | ORAL | Status: DC | PRN

## 2013-01-22 NOTE — Progress Notes (Signed)
Patient called me into room stating she was bleeding, she had scant amount of tan drainage on her pad and on the tissue she wiped with. Some yellowish white discharge was also noted.

## 2013-01-22 NOTE — Progress Notes (Signed)
Apical pulse is 116

## 2013-01-22 NOTE — Progress Notes (Signed)
Pt states she feels like she moves and feels wet  In/out cath obtained this am for urine culture

## 2013-01-22 NOTE — Progress Notes (Signed)
UR completed 

## 2013-01-22 NOTE — H&P (Signed)
I saw patient and examined her this morning and I agree with note  Adam Phenix, MD 01/22/2013 7:46 AM

## 2013-01-22 NOTE — H&P (Signed)
Chief Complaint: Contractions and Vaginal Bleeding   First Provider Initiated Contact with Patient 01/21/13 2208  HPI: Alice [name] is a 27 y.o. G2P0101 at [redacted]w[redacted]d by LMP, verified by 7 week ultrasound who presents to maternity admissions reporting leaking watery, bloody fluid and having UC's Q20 minutes this evening. States she saw a small amount of bright red blood on her panty liner and soon afterward saturated the panty liner with pink watery fluid. Does not think she lost bladder control. Good fetal movement. [facility] with concerns of pelvic pressure. Stated she felt the way she did shortly before going into labor with her first child. Cervix was fingertip and long. Fetal fibronectin negative. GBS, gonorrhea, chlamydia cultures and wet prep collected, pending. No leaking or bleeding at that time.  Pregnancy Course: Complicated by diamniotic dichorionic twins, history of right nephrectomy, unspecified tissue overgrowth disorder, morbid obesity, recurrent UTIs and history of preterm birth at 36.5 weeks. No 17-P due to twins.   Clinic [facility]  Pap normal  Genetic Screen NT/IT: neg   Anatomic [place] suboptimal views, otherwise nl female 'Sophia' and female 'Marlene Bast'  Glucose Screen Early 2hr normal, Repeat: normal 74/137/100  Flu vaccine 01/01/13  CF Screen neg  GC/CT Initial:   -/-             36+wks:   GBS   Feed Preference breast  Contraception Undecided, discussed thoroughly  Circumcision Yes, if boys  Childbirth Classes offered  Pediatrician [facility]   Past Medical History:  Past Medical History   Diagnosis  Date   .  Congenital obstruction of ureteropelvic junction (UPJ)    .  History of kidney stones    .  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  History of recurrent UTIs    .  Round ligament pain  09/10/2012   .  Morbid obesity     Past obstetric history:  OB History   Gravida  Para  Term  Preterm  AB  SAB   TAB  Ectopic  Multiple  Living   2  1   1       1      #  Outcome  Date  GA  Lbr Len/2nd  Weight  Sex  Delivery  Anes  PTL  Lv   2  CUR            1  PRE  2012  [redacted]w[redacted]d   2.92 kg (6 lb 7 oz)  M  SVD  EPI  N  Y      Past Surgical History:  Past Surgical History   Procedure  Laterality  Date   .  Nephrectomy       right side   .  Nasal sinus surgery     .  Tonsillectomy     .  Other surgical history       ulner nerve removal   .  Addenoidectomy      Family History:  Family History   Problem  Relation  Age of Onset   .  Hypertension  Mother    .  Hyperlipidemia  Mother    .  Heart disease  Father      MI   .  Alcohol abuse  Father    .  Alcohol abuse  Brother    .  Diabetes  Maternal Grandmother    .  Hyperlipidemia  Maternal Grandmother  Social History:  History   Substance Use Topics   .  Smoking status:  Former Smoker     Types:  Cigarettes     Quit date:  06/21/2012   .  Smokeless tobacco:  Never Used   .  Alcohol Use:  No    Allergies:  Allergies   Allergen  Reactions   .  Cinnamon Flavor  Swelling     Artificial cinnamon causes tongue to swell   .  Codeine  Nausea Only     Straight codeine only   .  Ibuprofen  Other (See Comments)     Causes severe pain in stomach   .  Levofloxacin  Hives   .  Reglan [Metoclopramide]  Other (See Comments)     REACTION: causes skin to "crawl"    Meds:  Prescriptions prior to admission   Medication  Sig  Dispense  Refill   .  albuterol (PROVENTIL HFA;VENTOLIN HFA) 108 (90 BASE) MCG/ACT inhaler  Inhale 2 puffs into the lungs every 6 (six) hours as needed for shortness of breath.  1 Inhaler  0   .  butalbital-acetaminophen-caffeine (FIORICET) 50-325-40 MG per tablet  Take 1-2 tablets by mouth every 6 (six) hours as needed for headache.  20 tablet  1   .  Doxylamine-Pyridoxine 10-10 MG TBEC  Take 20 mg by mouth at bedtime.     .  nitrofurantoin, macrocrystal-monohydrate, (MACROBID) 100 MG capsule  Take 1 capsule (100 mg total)  by mouth at bedtime.  30 capsule  9   .  omeprazole (PRILOSEC) 20 MG capsule  Take 20 mg by mouth at bedtime.     .  Prenatal Vit-Fe Fumarate-FA (PRENATAL MULTIVITAMIN) TABS tablet  Take 1 tablet by mouth at bedtime.     .  promethazine (PHENERGAN) 25 MG tablet  Take 1 tablet (25 mg total) by mouth every 6 (six) hours as needed for nausea.  20 tablet  2   .  zolpidem (AMBIEN) 10 MG tablet  Take 5-10 mg by mouth at bedtime as needed for sleep.     .  [DISCONTINUED] Doxylamine-Pyridoxine (DICLEGIS) 10-10 MG TBEC  Take 10 mg by mouth See admin instructions.  100 tablet  4   .  [DISCONTINUED] omeprazole (PRILOSEC) 20 MG capsule  Take 1 capsule (20 mg total) by mouth daily.  30 capsule  11   .  [DISCONTINUED] zolpidem (AMBIEN) 10 MG tablet  Take 1 tablet (10 mg total) by mouth at bedtime as needed for sleep.  15 tablet  3    ROS: Pertinent positives findings in history of present illness. Negative for fever, chills, urinary complaints, constant abdominal pain or GI complaints.  Physical Exam   Blood pressure 114/87, pulse 82, temperature 98.9 F (37.2 C), temperature source Oral, resp. rate 16, height 5\' 9"  (1.753 m), weight 168.284 kg (371 lb), last menstrual period 06/02/2012, SpO2 99.00%.  GENERAL: Well-developed, well-nourished obese female in mild distress.  HEENT: normocephalic  HEART: normal rate. No murmurs rubs or gallops.  RESP: normal effort, clear to auscultation bilaterally.  ABDOMEN: Soft, non-tender, gravid, size greater than dates.  EXTREMITIES: Nontender, no edema  NEURO: alert and oriented  SPECULUM EXAM: NEFG, small amount of bright red blood mixed with clear mucus. Negative pooling. cervix clean.  Dilation: 1  Effacement (%): Thick  Cervical Position: Posterior  Station: -3  Exam by:: V. Shyasia Funches CNM; sm amt vag bleeding  FHT: Baseline 140/140 , moderate variability, accelerations present, no decelerations  Contractions: UI. Uterus palpates soft.  Labs:  Results for orders  placed during the hospital encounter of 01/21/13 (from the past 24 hour(s))   URINALYSIS, ROUTINE W REFLEX MICROSCOPIC Status: Abnormal    Collection Time    01/21/13 9:10 PM   Result  Value  Range    Color, Urine  YELLOW  YELLOW    APPearance  CLOUDY (*)  CLEAR    Specific Gravity, Urine  1.020  1.005 - 1.030    pH  6.0  5.0 - 8.0    Glucose, UA  NEGATIVE  NEGATIVE mg/dL    Hgb urine dipstick  LARGE (*)  NEGATIVE    Bilirubin Urine  NEGATIVE  NEGATIVE    Ketones, ur  NEGATIVE  NEGATIVE mg/dL    Protein, ur  NEGATIVE  NEGATIVE mg/dL    Urobilinogen, UA  0.2  0.0 - 1.0 mg/dL    Nitrite  NEGATIVE  NEGATIVE    Leukocytes, UA  SMALL (*)  NEGATIVE   URINE MICROSCOPIC-ADD ON Status: Abnormal    Collection Time    01/21/13 9:10 PM   Result  Value  Range    Squamous Epithelial / LPF  MANY (*)  RARE    WBC, UA  7-10  <3 WBC/hpf    RBC / HPF  3-6  <3 RBC/hpf    Bacteria, UA  RARE  RARE    Crystals  CA OXALATE CRYSTALS (*)  NEGATIVE    Urine-Other  AMORPHOUS URATES/PHOSPHATES    POCT FERN TEST Status: None    Collection Time    01/21/13 10:27 PM   Result  Value  Range    POCT Fern Test  Negative = intact amniotic membranes    CBC Status: Abnormal    Collection Time    01/22/13 1:10 AM   Result  Value  Range    WBC  11.2 (*)  4.0 - 10.5 K/uL    RBC  4.08  3.87 - 5.11 MIL/uL    Hemoglobin  12.2  12.0 - 15.0 g/dL    HCT  16.1 (*)  09.6 - 46.0 %    MCV  87.3  78.0 - 100.0 fL    MCH  29.9  26.0 - 34.0 pg    MCHC  34.3  30.0 - 36.0 g/dL    RDW  04.5  40.9 - 81.1 %    Platelets  214  150 - 400 K/uL    Imaging:  01/21/2013 p.m.: OB limited.  No evidence of abruption. Exam severely limited by body habitus.  Presenting fetus is vertex (formerly baby B). Second fetus is breech.  Largest single fluid pocket on presenting fetus is 4 cm. Largest single fluid pocket on second fetus is 4.9 cm. Consistent with exam this morning.  12/1 Growth . 3 % discordance. S=D.  [place] Ua Doppler Add'l Gest  Re-eval  01/21/2013 TWINS FOLLOW UP SONOGRAM Alice [name] is in the office for a follow up sonogram of a twin gestation. She is a 27 y.o. year old G21P0101 with Estimated Date of Delivery: 03/09/13 now at [redacted]w[redacted]d weeks gestation. Thus far the pregnancy has been otherwise complicated by poor ob hx, morbid obesity. The twins are dichorionic/diamniotic. TWIN A GESTATION: PRESENTATION: breech frank FETAL ACTIVITY: Heart rate 143 bpm The fetus is active. AMNIOTIC FLUID: The amniotic fluid volume is normal, 4.7 cm single deepest pocket. CERVIX: Not visualized ADNEXA: The adnexa appears normal. GESTATIONAL AGE AND BIOMETRICS: Gestational criteria: Estimated Date of Delivery: 03/09/13  now at [redacted]w[redacted]d BIOPHYSCIAL PROFILE: COMMENTS GROSS BODY MOVEMENT 2 TONE 2 RESPIRATIONS 2 AMNIOTIC FLUID 2 SCORE: 8/8 (Note: NST was not performed as part of this antepartum testing) DOPPLER FLOW STUDIES: UMBILICAL ARTERY RI RATIOS: 0.57 SUSPECTED ABNORMALITIES: no QUALITY OF SCAN: Sub-optimal due to maternal body habitus TWIN B GESTATION: PRESENTATION: cephalic FETAL ACTIVITY: Heart rate 142 bpm The fetus is active. AMNIOTIC FLUID: The amniotic fluid volume is normal, 2.7 cm single deepest pocket. GESTATIONAL AGE AND BIOMETRICS: Gestational criteria: Estimated Date of Delivery: 03/09/13 now at [redacted]w[redacted]d BIOPHYSCIAL PROFILE: COMMENTS GROSS BODY MOVEMENT 2 TONE 2 RESPIRATIONS 2 AMNIOTIC FLUID 2 SCORE: 8/8 (Note: NST was not performed as part of this antepartum testing) DOPPLER FLOW STUDIES: UMBILICAL ARTERY RI RATIOS: 0.64 SUSPECTED ABNORMALITIES: no QUALITY OF SCAN: Sub-optimal due to maternal body habitus TECHNICIAN COMMENTS: U/S(33+2wks)-twin IUP, membrane noted Baby A-breech, active fetus BPP 8/8, fluid appears WNL, single deepest pocket=4.7cm, UA Doppler RI-57, "Sophia" Baby B-vtx active fetus BPP 8/8, fluid appears WNL single deepest pocket=2.7cm, UA Doppler RI-0.64, "Mason" A copy of this report including all images has been saved and backed up  to a second source for retrieval if needed. All measures and details of the anatomical scan, placentation, fluid volume and pelvic anatomy are contained in that report. Chari Manning 01/21/2013 11:36 AM   Prenatal Results    ABO ([facility])  O    07/23/12 1730    RH ([facility])  POS    07/23/12 1730              Antibody Screen  NEG  NEGATIVE 12/17/12 0925    1st Trimester Ref. Range Date Time    HGB  14.1 g/dL 40.9 - 81.1 g/dL 91/47/82 9562    HCT  13.0 % 36.0 - 46.0 % 07/23/12 1730    Platelets  303 K/uL 150 - 400 K/uL 07/23/12 1730    Rubella  5.34 Index (H) <0.90 07/23/12 1730    Varicella  1870.00 Index (H) <135.00 07/23/12 1730    RPR  NON REAC  NON REAC 07/23/12 1730    HBsAg  NEGATIVE    07/23/12 1730    HIV  NON REACTIVE  NON REACTIVE 07/23/12 1730     HGB  12.2 g/dL 86.5 - 78.4 g/dL 69/62/95 2841    HCT  32.4 % (L) 36.0 - 46.0 % 01/22/13 0110    Platelets  214 K/uL 150 - 400 K/uL 01/22/13 0110    GBS    pending        Gonorrhea           pending Chlamydia           pending RPR  NON REAC  NON REAC 12/17/12 0925    HIV  NON REACTIVE  NON REACTIVE 12/17/12 0925         Fetal Fibronectin  NEG  NEGATIVE 01/21/13 1237     MAU Course:  OB Limited, Fern, betamethasone.  Assessment:  1.  Vaginal bleeding in pregnancy, third trimester   2.  Supervision of high-risk pregnancy, third trimester   3.  Twin pregnancy, antepartum   4.  Obesity, morbid, BMI 50 or higher   5.  H/O preterm delivery, currently pregnant, first trimester   6.  Preterm contractions, third trimester   No evidence of ruptured membranes.  Plan:  23 hour observation on antenatal per consult with Dr. Debroah Loop.  Repeat betamethasone in 24 hours.  IV fluids.  NST every shift.  Continues toco.  Cath UA due to contaminated clean-catch specimen, preterm contractions and history of recurrent UTI and congenital urinary tract abnormalities.   [place], CNM  01/21/2013  10:09 PM

## 2013-01-23 DIAGNOSIS — O4690 Antepartum hemorrhage, unspecified, unspecified trimester: Secondary | ICD-10-CM

## 2013-01-23 LAB — STREP B DNA PROBE: GBSP: NEGATIVE

## 2013-01-23 LAB — CULTURE, OB URINE
Culture: NO GROWTH
Special Requests: NORMAL

## 2013-01-23 MED ORDER — CYCLOBENZAPRINE HCL 10 MG PO TABS
10.0000 mg | ORAL_TABLET | Freq: Once | ORAL | Status: AC
  Administered 2013-01-23: 10 mg via ORAL
  Filled 2013-01-23: qty 1

## 2013-01-23 NOTE — Progress Notes (Signed)
TC to [facility] CNM to report pt's complaint of Headache resistant to Tylenol, vaginal bleeding and backache.  Peri pad with scant amt of dark beige drainage.  Pt stated that bright red blood on toilet paper on wiping.  Request that pt save paper for RN to assess.  See orders.

## 2013-01-23 NOTE — Progress Notes (Signed)
Patient discharged per wheelchair. Patient education provided per discharge instructions with patient performing a teach back. Patient to follow up at appointment tomorrow.

## 2013-01-23 NOTE — Discharge Summary (Addendum)
Antenatal Physician Discharge Summary  Patient ID: Alice [name] MRN: [id number] DOB/AGE: March 04, 1985 27 y.o.  Admit date: 01/21/2013 Discharge date: 01/23/2013  Admission Diagnoses: DC/DA TIUP @ 33 wks, vaginal bleeding, h/o PTB  Discharge Diagnoses: same  Prenatal Procedures: NST and betamethasone   Significant Diagnostic Studies:  Results for orders placed during the hospital encounter of 01/21/13 (from the past 168 hour(s))  URINALYSIS, ROUTINE W REFLEX MICROSCOPIC   Collection Time    01/21/13  9:10 PM      Result Value Range   Color, Urine YELLOW  YELLOW   APPearance CLOUDY (*) CLEAR   Specific Gravity, Urine 1.020  1.005 - 1.030   pH 6.0  5.0 - 8.0   Glucose, UA NEGATIVE  NEGATIVE mg/dL   Hgb urine dipstick LARGE (*) NEGATIVE   Bilirubin Urine NEGATIVE  NEGATIVE   Ketones, ur NEGATIVE  NEGATIVE mg/dL   Protein, ur NEGATIVE  NEGATIVE mg/dL   Urobilinogen, UA 0.2  0.0 - 1.0 mg/dL   Nitrite NEGATIVE  NEGATIVE   Leukocytes, UA SMALL (*) NEGATIVE  URINE MICROSCOPIC-ADD ON   Collection Time    01/21/13  9:10 PM      Result Value Range   Squamous Epithelial / LPF MANY (*) RARE   WBC, UA 7-10  <3 WBC/hpf   RBC / HPF 3-6  <3 RBC/hpf   Bacteria, UA RARE  RARE   Crystals CA OXALATE CRYSTALS (*) NEGATIVE   Urine-Other AMORPHOUS URATES/PHOSPHATES    POCT FERN TEST   Collection Time    01/21/13 10:27 PM      Result Value Range   POCT Fern Test Negative = intact amniotic membranes    CBC   Collection Time    01/22/13  1:10 AM      Result Value Range   WBC 11.2 (*) 4.0 - 10.5 K/uL   RBC 4.08  3.87 - 5.11 MIL/uL   Hemoglobin 12.2  12.0 - 15.0 g/dL   HCT 27.2 (*) 53.6 - 64.4 %   MCV 87.3  78.0 - 100.0 fL   MCH 29.9  26.0 - 34.0 pg   MCHC 34.3  30.0 - 36.0 g/dL   RDW 03.4  74.2 - 59.5 %   Platelets 214  150 - 400 K/uL  RPR   Collection Time    01/22/13  1:10 AM      Result Value Range   RPR NON REACTIVE  NON REACTIVE  POCT URINALYSIS DIPSTICK   Collection  Time    01/21/13 11:47 AM      Result Value Range   Color, UA       Clarity, UA       Glucose, UA neg     Bilirubin, UA       Ketones, UA neg     Spec Grav, UA       Blood, UA neg     pH, UA       Protein, UA neg     Urobilinogen, UA       Nitrite, UA neg     Leukocytes, UA Negative    GC/CHLAMYDIA PROBE AMP   Collection Time    01/21/13 12:36 PM      Result Value Range   CT Probe RNA NEGATIVE     GC Probe RNA NEGATIVE    FETAL FIBRONECTIN   Collection Time    01/21/13 12:37 PM      Result Value Range   Fetal Fibronectin NEG  NEGATIVE  POCT WET PREP (WET MOUNT)   Collection Time    01/21/13 12:37 PM      Result Value Range   Source Wet Prep POC vaginal     WBC, Wet Prep HPF POC none     Bacteria Wet Prep HPF POC none     BACTERIA WET PREP MORPHOLOGY POC       Clue Cells Wet Prep HPF POC None     CLUE CELLS WET PREP WHIFF POC Negative Whiff     Yeast Wet Prep HPF POC None     KOH Wet Prep POC       Trichomonas Wet Prep HPF POC none    Results for orders placed in visit on 01/17/13 (from the past 168 hour(s))  POCT URINALYSIS DIPSTICK   Collection Time    01/17/13 10:24 AM      Result Value Range   Color, UA       Clarity, UA       Glucose, UA neg     Bilirubin, UA       Ketones, UA neg     Spec Grav, UA       Blood, UA neg     pH, UA       Protein, UA trace     Urobilinogen, UA       Nitrite, UA neg     Leukocytes, UA Trace      Treatments: IV hydration and steroids: betamethasone x 2  Hospital Course:  This is a 27 y.o. G2P0101 with IUP at [redacted]w[redacted]d admitted for vaginal bleeding and pain.  Previously seen in the office on day of admission with exam and had negative FFN at that time.  She has a h/o recurrent UTI's with previous nephrectomy. Marland Kitchen She was admitted with minimal contractions, noted to have a cervical exam of 1 cm/long.  No leaking of fluid and no bleeding.  She was initially started on IV fluids and received betamethasone x 2 doses.  She was  observed, fetal heart rate monitoring remained reassuring x 2, and she had no signs/symptoms of progressing preterm labor or other maternal-fetal concerns.  Her cervical exam was unchanged from admission.  She was deemed stable for discharge to home with outpatient follow up.  Discharge Exam: BP 110/60  Pulse 88  Temp(Src) 97.9 F (36.6 C) (Oral)  Resp 20  Ht 5\' 9"  (1.753 m)  Wt 371 lb (168.284 kg)  BMI 54.76 kg/m2  SpO2 99%  LMP 06/02/2012 General appearance: alert, cooperative and appears stated age GI: soft, non-tender; bowel sounds normal; no masses,  no organomegaly Pelvic: external genitalia normal and cervix is 1 cm/long/soft Extremities: extremities normal, atraumatic, no cyanosis or edema NST-A-130's, avg var, + accels, no decels B-140's avg var, + accels, no decels. Discharge Condition: Stable  Disposition: 01-Home or Self Care  Discharge Orders   Future Appointments Provider Department Dept Phone   01/24/2013 9:45 AM Jacklyn Shell, CNM [facility] [phone]   Joint Appt [facility] [phone]   Future Orders Complete By Expires   Discharge activity:  No Restrictions  As directed    Discharge diet:  No restrictions  As directed    Do not have sex or do anything that might make you have an orgasm  As directed    Fetal Kick Count:  Lie on our left side for one hour after a meal, and count the number of times your baby kicks.  If it is  less than 5 times, get up, move around and drink some juice.  Repeat the test 30 minutes later.  If it is still less than 5 kicks in an hour, notify your doctor.  As directed    LABOR:  When conractions begin, you should start to time them from the beginning of one contraction to the beginning  of the next.  When contractions are 5 - 10 minutes apart or less and have been regular for at least an hour, you should call your health care provider.  As directed    Notify physician for bleeding from  the vagina  As directed    Notify physician for blurring of vision or spots before the eyes  As directed    Notify physician for chills or fever  As directed    Notify physician for fainting spells, "black outs" or loss of consciousness  As directed    Notify physician for increase in vaginal discharge  As directed    Notify physician for leaking of fluid  As directed    Notify physician for pain or burning when urinating  As directed    Notify physician for pelvic pressure (sudden increase)  As directed    Notify physician for severe or continued nausea or vomiting  As directed    Notify physician for sudden gushing of fluid from the vagina (with or without continued leaking)  As directed    Notify physician for sudden, constant, or occasional abdominal pain  As directed    Notify physician if baby moving less than usual  As directed        Medication List         albuterol 108 (90 BASE) MCG/ACT inhaler  Commonly known as:  PROVENTIL HFA;VENTOLIN HFA  Inhale 2 puffs into the lungs every 6 (six) hours as needed for shortness of breath.     butalbital-acetaminophen-caffeine 50-325-40 MG per tablet  Commonly known as:  FIORICET  Take 1-2 tablets by mouth every 6 (six) hours as needed for headache.     Doxylamine-Pyridoxine 10-10 MG Tbec  Take 20 mg by mouth at bedtime.     nitrofurantoin (macrocrystal-monohydrate) 100 MG capsule  Commonly known as:  MACROBID  Take 1 capsule (100 mg total) by mouth at bedtime.     omeprazole 20 MG capsule  Commonly known as:  PRILOSEC  Take 20 mg by mouth at bedtime.     prenatal multivitamin Tabs tablet  Take 1 tablet by mouth at bedtime.     promethazine 25 MG tablet  Commonly known as:  PHENERGAN  Take 1 tablet (25 mg total) by mouth every 6 (six) hours as needed for nausea.     zolpidem 10 MG tablet  Commonly known as:  AMBIEN  Take 5-10 mg by mouth at bedtime as needed for sleep.           Follow-up Information   Follow up with  [facility]. (keep next appointment.)    Contact information:   [address] [place] [address] [phone]      Signed: Reva Bores M.D. 01/23/2013, 7:31 AM

## 2013-01-24 ENCOUNTER — Encounter: Payer: Self-pay | Admitting: Advanced Practice Midwife

## 2013-01-24 ENCOUNTER — Ambulatory Visit (INDEPENDENT_AMBULATORY_CARE_PROVIDER_SITE_OTHER): Payer: Medicaid Other | Admitting: Advanced Practice Midwife

## 2013-01-24 ENCOUNTER — Other Ambulatory Visit: Payer: Self-pay | Admitting: Obstetrics & Gynecology

## 2013-01-24 VITALS — BP 122/78 | Wt 374.0 lb

## 2013-01-24 DIAGNOSIS — Z1389 Encounter for screening for other disorder: Secondary | ICD-10-CM

## 2013-01-24 DIAGNOSIS — O30009 Twin pregnancy, unspecified number of placenta and unspecified number of amniotic sacs, unspecified trimester: Secondary | ICD-10-CM

## 2013-01-24 DIAGNOSIS — E669 Obesity, unspecified: Secondary | ICD-10-CM

## 2013-01-24 DIAGNOSIS — O09299 Supervision of pregnancy with other poor reproductive or obstetric history, unspecified trimester: Secondary | ICD-10-CM

## 2013-01-24 DIAGNOSIS — Z331 Pregnant state, incidental: Secondary | ICD-10-CM

## 2013-01-24 DIAGNOSIS — O0993 Supervision of high risk pregnancy, unspecified, third trimester: Secondary | ICD-10-CM

## 2013-01-24 LAB — POCT URINALYSIS DIPSTICK
Blood, UA: NEGATIVE
Ketones, UA: NEGATIVE
Nitrite, UA: NEGATIVE
Protein, UA: NEGATIVE

## 2013-01-24 NOTE — Progress Notes (Signed)
Was in ante for 2 days for vaginal bleeding.  W/u negative, ? Cervical bleeding.  NST's reactive today.  Pt states she is alternating BPP with NST.  No other c/o.  F/U Monday for BPP.

## 2013-01-28 ENCOUNTER — Encounter: Payer: Self-pay | Admitting: Women's Health

## 2013-01-28 ENCOUNTER — Ambulatory Visit (INDEPENDENT_AMBULATORY_CARE_PROVIDER_SITE_OTHER): Payer: Medicaid Other | Admitting: Women's Health

## 2013-01-28 ENCOUNTER — Ambulatory Visit (INDEPENDENT_AMBULATORY_CARE_PROVIDER_SITE_OTHER): Payer: Medicaid Other

## 2013-01-28 VITALS — BP 110/82 | Wt 365.0 lb

## 2013-01-28 DIAGNOSIS — O09299 Supervision of pregnancy with other poor reproductive or obstetric history, unspecified trimester: Secondary | ICD-10-CM

## 2013-01-28 DIAGNOSIS — O30009 Twin pregnancy, unspecified number of placenta and unspecified number of amniotic sacs, unspecified trimester: Secondary | ICD-10-CM

## 2013-01-28 DIAGNOSIS — O0993 Supervision of high risk pregnancy, unspecified, third trimester: Secondary | ICD-10-CM

## 2013-01-28 DIAGNOSIS — E669 Obesity, unspecified: Secondary | ICD-10-CM

## 2013-01-28 DIAGNOSIS — Z331 Pregnant state, incidental: Secondary | ICD-10-CM

## 2013-01-28 DIAGNOSIS — Z1389 Encounter for screening for other disorder: Secondary | ICD-10-CM

## 2013-01-28 LAB — POCT URINALYSIS DIPSTICK
Glucose, UA: NEGATIVE
Leukocytes, UA: NEGATIVE
Nitrite, UA: NEGATIVE

## 2013-01-28 NOTE — Patient Instructions (Signed)
Preterm Labor Information Preterm labor is when labor starts at less than 37 weeks of pregnancy. The normal length of a pregnancy is 39 to 41 weeks. CAUSES Often, there is no identifiable underlying cause as to why a woman goes into preterm labor. One of the most common known causes of preterm labor is infection. Infections of the uterus, cervix, vagina, amniotic sac, bladder, kidney, or even the lungs (pneumonia) can cause labor to start. Other suspected causes of preterm labor include:   Urogenital infections, such as yeast infections and bacterial vaginosis.   Uterine abnormalities (uterine shape, uterine septum, fibroids, or bleeding from the placenta).   A cervix that has been operated on (it may fail to stay closed).   Malformations in the fetus.   Multiple gestations (twins, triplets, and so on).   Breakage of the amniotic sac.  RISK FACTORS  Having a previous history of preterm labor.   Having premature rupture of membranes (PROM).   Having a placenta that covers the opening of the cervix (placenta previa).   Having a placenta that separates from the uterus (placental abruption).   Having a cervix that is too weak to hold the fetus in the uterus (incompetent cervix).   Having too much fluid in the amniotic sac (polyhydramnios).   Taking illegal drugs or smoking while pregnant.   Not gaining enough weight while pregnant.   Being younger than 18 and older than 28 years old.   Having a low socioeconomic status.   Being African American. SYMPTOMS Signs and symptoms of preterm labor include:   Menstrual-like cramps, abdominal pain, or back pain.  Uterine contractions that are regular, as frequent as six in an hour, regardless of their intensity (may be mild or painful).  Contractions that start on the top of the uterus and spread down to the lower abdomen and back.   A sense of increased pelvic pressure.   A watery or bloody mucus discharge that  comes from the vagina.  TREATMENT Depending on the length of the pregnancy and other circumstances, your health care provider may suggest bed rest. If necessary, there are medicines that can be given to stop contractions and to mature the fetal lungs. If labor happens before 34 weeks of pregnancy, a prolonged hospital stay may be recommended. Treatment depends on the condition of both you and the fetus.  WHAT SHOULD YOU DO IF YOU THINK YOU ARE IN PRETERM LABOR? Call your health care provider right away. You will need to go to the hospital to get checked immediately. HOW CAN YOU PREVENT PRETERM LABOR IN FUTURE PREGNANCIES? You should:   Stop smoking if you smoke.  Maintain healthy weight gain and avoid chemicals and drugs that are not necessary.  Be watchful for any type of infection.  Inform your health care provider if you have a known history of preterm labor. Document Released: 04/23/2003 Document Revised: 10/03/2012 Document Reviewed: 03/05/2012 [employer] Patient Information 2014 [employer].    

## 2013-01-28 NOTE — Progress Notes (Signed)
U/S(34+2wks)-twin IUP, Baby A on maternal Rt side, Baby B on maternal Lt side, EFW discordance=3% Baby A- breech, active fetus BPP 8/8, fluid wnl single deepest pocket=6.1cm, female fetus "Sophia", UA Doppler RI-0.53, FHR- 165 bpm, EFW 17Th%tile (4 lb 5 oz),  Baby B-breech, active fetus, BPP 8/8, fluid wnl single deepest pocket=2.7cm, female fetus "Mason", UA Doppler RI-0.56, FHR-149 bpm, EFW 14th%tile (4 lb 3 oz)

## 2013-01-28 NOTE — Progress Notes (Signed)
FH measured from U. Reports good fm x 2. Denies lof, no vb since d/c from [facility], urinary frequency, urgency, hesitancy, or dysuria.  Per u/s today both babies now breech. UCs were q and SVE 2cm per pt when d/c'd from [facility] on 12/10, states uc's q mins today, thinks she lost mucous plug yesterday, requests SVE. SVE: 1.5/th/-3 ballotable. Reviewed today's u/s, ptl s/s, fkc, reasons to call/go to [facility].  All questions answered. F/U in 3d for NST and visit.

## 2013-01-31 ENCOUNTER — Other Ambulatory Visit: Payer: Self-pay | Admitting: Obstetrics & Gynecology

## 2013-01-31 ENCOUNTER — Ambulatory Visit (INDEPENDENT_AMBULATORY_CARE_PROVIDER_SITE_OTHER): Payer: Medicaid Other | Admitting: Advanced Practice Midwife

## 2013-01-31 ENCOUNTER — Other Ambulatory Visit: Payer: Self-pay | Admitting: Advanced Practice Midwife

## 2013-01-31 ENCOUNTER — Encounter: Payer: Self-pay | Admitting: Advanced Practice Midwife

## 2013-01-31 VITALS — BP 122/90 | Wt 364.0 lb

## 2013-01-31 DIAGNOSIS — O30009 Twin pregnancy, unspecified number of placenta and unspecified number of amniotic sacs, unspecified trimester: Secondary | ICD-10-CM

## 2013-01-31 DIAGNOSIS — O0993 Supervision of high risk pregnancy, unspecified, third trimester: Secondary | ICD-10-CM

## 2013-01-31 DIAGNOSIS — Z1389 Encounter for screening for other disorder: Secondary | ICD-10-CM

## 2013-01-31 DIAGNOSIS — IMO0002 Reserved for concepts with insufficient information to code with codable children: Secondary | ICD-10-CM

## 2013-01-31 DIAGNOSIS — O09219 Supervision of pregnancy with history of pre-term labor, unspecified trimester: Secondary | ICD-10-CM

## 2013-01-31 DIAGNOSIS — Z331 Pregnant state, incidental: Secondary | ICD-10-CM

## 2013-01-31 DIAGNOSIS — O09899 Supervision of other high risk pregnancies, unspecified trimester: Secondary | ICD-10-CM

## 2013-01-31 LAB — POCT URINALYSIS DIPSTICK
Glucose, UA: NEGATIVE
Ketones, UA: NEGATIVE

## 2013-01-31 NOTE — Progress Notes (Signed)
NST reactive X 2 today.  Both still breech.    No c/o at this time.  Routine questions about pregnancy answered.  F/U in Monday for BPP

## 2013-02-04 ENCOUNTER — Encounter: Payer: Self-pay | Admitting: Obstetrics & Gynecology

## 2013-02-04 ENCOUNTER — Ambulatory Visit (INDEPENDENT_AMBULATORY_CARE_PROVIDER_SITE_OTHER): Payer: Medicaid Other

## 2013-02-04 ENCOUNTER — Other Ambulatory Visit: Payer: Self-pay | Admitting: Advanced Practice Midwife

## 2013-02-04 ENCOUNTER — Ambulatory Visit (INDEPENDENT_AMBULATORY_CARE_PROVIDER_SITE_OTHER): Payer: Medicaid Other | Admitting: Obstetrics & Gynecology

## 2013-02-04 VITALS — BP 122/80 | Wt 365.4 lb

## 2013-02-04 DIAGNOSIS — Z1389 Encounter for screening for other disorder: Secondary | ICD-10-CM

## 2013-02-04 DIAGNOSIS — E669 Obesity, unspecified: Secondary | ICD-10-CM

## 2013-02-04 DIAGNOSIS — O30009 Twin pregnancy, unspecified number of placenta and unspecified number of amniotic sacs, unspecified trimester: Secondary | ICD-10-CM

## 2013-02-04 DIAGNOSIS — Z331 Pregnant state, incidental: Secondary | ICD-10-CM

## 2013-02-04 DIAGNOSIS — O09899 Supervision of other high risk pregnancies, unspecified trimester: Secondary | ICD-10-CM

## 2013-02-04 DIAGNOSIS — O09219 Supervision of pregnancy with history of pre-term labor, unspecified trimester: Secondary | ICD-10-CM

## 2013-02-04 LAB — POCT URINALYSIS DIPSTICK
Glucose, UA: NEGATIVE
Ketones, UA: NEGATIVE

## 2013-02-04 NOTE — Progress Notes (Signed)
U/S(35+2wks)-Twin IUP Di/Di,  Baby A-vtx active fetus, BPP 8/8, fluid WNL AFI-4.9cm(single deepest pocket), female fetus "Alice [name]" UA Doppler RI-0.56, FHR-148bpm Baby B-vtx active fetus, BPP 8/8, fluid WNL AFI-3.3cm (single deepest pocket),  Female fetus "Alice [name], UA Doppler RI-0.62, FHR-167 bpm

## 2013-02-04 NOTE — Progress Notes (Signed)
Sonogram reviewed and report done.  Both twins are vertex, I mistakenly noted them to be breech.  Please note BP weight and urine results all reviewed and noted. Patient reports good fetal movement, denies any bleeding and no rupture of membranes symptoms or regular contractions. Patient is without complaints. All questions were answered.

## 2013-02-11 ENCOUNTER — Other Ambulatory Visit: Payer: Self-pay | Admitting: *Deleted

## 2013-02-11 DIAGNOSIS — O0993 Supervision of high risk pregnancy, unspecified, third trimester: Secondary | ICD-10-CM

## 2013-02-11 DIAGNOSIS — O30009 Twin pregnancy, unspecified number of placenta and unspecified number of amniotic sacs, unspecified trimester: Secondary | ICD-10-CM

## 2013-02-11 DIAGNOSIS — O4693 Antepartum hemorrhage, unspecified, third trimester: Secondary | ICD-10-CM

## 2013-02-11 DIAGNOSIS — O4703 False labor before 37 completed weeks of gestation, third trimester: Secondary | ICD-10-CM

## 2013-02-12 ENCOUNTER — Other Ambulatory Visit: Payer: Self-pay | Admitting: Obstetrics & Gynecology

## 2013-02-12 ENCOUNTER — Ambulatory Visit (INDEPENDENT_AMBULATORY_CARE_PROVIDER_SITE_OTHER): Payer: Medicaid Other | Admitting: Obstetrics & Gynecology

## 2013-02-12 ENCOUNTER — Ambulatory Visit (INDEPENDENT_AMBULATORY_CARE_PROVIDER_SITE_OTHER): Payer: Medicaid Other

## 2013-02-12 ENCOUNTER — Encounter (INDEPENDENT_AMBULATORY_CARE_PROVIDER_SITE_OTHER): Payer: Self-pay

## 2013-02-12 ENCOUNTER — Encounter: Payer: Self-pay | Admitting: Obstetrics & Gynecology

## 2013-02-12 VITALS — BP 120/88 | Wt 374.6 lb

## 2013-02-12 DIAGNOSIS — Z331 Pregnant state, incidental: Secondary | ICD-10-CM

## 2013-02-12 DIAGNOSIS — Z1389 Encounter for screening for other disorder: Secondary | ICD-10-CM

## 2013-02-12 DIAGNOSIS — O30009 Twin pregnancy, unspecified number of placenta and unspecified number of amniotic sacs, unspecified trimester: Secondary | ICD-10-CM

## 2013-02-12 DIAGNOSIS — O09299 Supervision of pregnancy with other poor reproductive or obstetric history, unspecified trimester: Secondary | ICD-10-CM

## 2013-02-12 DIAGNOSIS — O09219 Supervision of pregnancy with history of pre-term labor, unspecified trimester: Secondary | ICD-10-CM

## 2013-02-12 DIAGNOSIS — O09899 Supervision of other high risk pregnancies, unspecified trimester: Secondary | ICD-10-CM

## 2013-02-12 LAB — POCT URINALYSIS DIPSTICK
Ketones, UA: NEGATIVE
Nitrite, UA: NEGATIVE
Protein, UA: NEGATIVE

## 2013-02-12 MED ORDER — ZOLPIDEM TARTRATE 10 MG PO TABS: 5.0000 mg | ORAL_TABLET | Freq: Every evening | ORAL | Status: DC | PRN

## 2013-02-12 NOTE — Progress Notes (Signed)
Breech/vertex presentations today Sonogram is reassuring otherwise, if remians breech will need C Section, pt aware BP weight and urine results all reviewed and noted. Patient reports good fetal movement, denies any bleeding and no rupture of membranes symptoms or regular contractions. Patient is without complaints. All questions were answered.

## 2013-02-12 NOTE — Progress Notes (Signed)
U/S(36+3wks)-twin IUP DI/DI, + membrane noted, EFW discordance - 4% Baby A-maternal Rt side- BREECH (FRANK), FHR-137 bpm, EFW 5 lb 8 oz (18th%tile), fluid wnl single deepest pocket-2.4cm, UA Doppler RI- 0.60 & 0.53, female fetus "Alice [name]", BPP 8/8 Baby B-maternal Lt side, VTX, FHR-151 bpm, EFW 5 lb 4 oz (13th%tile), fluid wnl single deepest pocket-2.2cm, UA Doppler RI -0.61 & 0.56, female fetus "Alice [name]", BPP 8/8

## 2013-02-15 ENCOUNTER — Ambulatory Visit (INDEPENDENT_AMBULATORY_CARE_PROVIDER_SITE_OTHER): Payer: Medicaid Other | Admitting: Obstetrics and Gynecology

## 2013-02-15 ENCOUNTER — Encounter (INDEPENDENT_AMBULATORY_CARE_PROVIDER_SITE_OTHER): Payer: Self-pay

## 2013-02-15 ENCOUNTER — Other Ambulatory Visit: Payer: Self-pay | Admitting: Obstetrics and Gynecology

## 2013-02-15 ENCOUNTER — Encounter: Payer: Self-pay | Admitting: Obstetrics and Gynecology

## 2013-02-15 VITALS — BP 122/86 | Wt 377.0 lb

## 2013-02-15 DIAGNOSIS — O329XX1 Maternal care for malpresentation of fetus, unspecified, fetus 1: Secondary | ICD-10-CM

## 2013-02-15 DIAGNOSIS — O3090 Multiple gestation, unspecified, unspecified trimester: Secondary | ICD-10-CM | POA: Insufficient documentation

## 2013-02-15 DIAGNOSIS — Z1389 Encounter for screening for other disorder: Secondary | ICD-10-CM

## 2013-02-15 DIAGNOSIS — O329XX0 Maternal care for malpresentation of fetus, unspecified, not applicable or unspecified: Secondary | ICD-10-CM | POA: Insufficient documentation

## 2013-02-15 DIAGNOSIS — O30009 Twin pregnancy, unspecified number of placenta and unspecified number of amniotic sacs, unspecified trimester: Secondary | ICD-10-CM

## 2013-02-15 DIAGNOSIS — O3093 Multiple gestation, unspecified, third trimester: Secondary | ICD-10-CM

## 2013-02-15 DIAGNOSIS — Z331 Pregnant state, incidental: Secondary | ICD-10-CM

## 2013-02-15 DIAGNOSIS — O321XX1 Maternal care for breech presentation, fetus 1: Secondary | ICD-10-CM

## 2013-02-15 DIAGNOSIS — O09899 Supervision of other high risk pregnancies, unspecified trimester: Secondary | ICD-10-CM

## 2013-02-15 DIAGNOSIS — O09219 Supervision of pregnancy with history of pre-term labor, unspecified trimester: Secondary | ICD-10-CM

## 2013-02-15 LAB — POCT URINALYSIS DIPSTICK
Blood, UA: NEGATIVE
Glucose, UA: NEGATIVE
KETONES UA: NEGATIVE
LEUKOCYTES UA: NEGATIVE
Nitrite, UA: NEGATIVE
Protein, UA: NEGATIVE

## 2013-02-15 MED ORDER — HYDROCODONE-ACETAMINOPHEN 5-325 MG PO TABS: 1.0000 | ORAL_TABLET | Freq: Four times a day (QID) | ORAL | Status: DC | PRN

## 2013-02-15 NOTE — Progress Notes (Signed)
Pt states she is having contractions about 9 minutes apart.

## 2013-02-15 NOTE — Addendum Note (Signed)
Addended by: Tilda BurrowFERGUSON, Malyn Aytes V on: 02/15/2013 10:47 AM   Modules accepted: Orders

## 2013-02-15 NOTE — Progress Notes (Signed)
nst reactive x 2.  Good FM , pt having contractions q 10 min, no srom or bleeding. Gradually increasing over the last few weeks. NO bleeding or SROM. No contr'n on efm. Plan  If  Pt still Breech Baby A on Monday u/s, will schedule c/s. Will be 38wk next Friday. Future BC -depo shot.

## 2013-02-18 ENCOUNTER — Encounter: Payer: Self-pay | Admitting: Obstetrics & Gynecology

## 2013-02-18 ENCOUNTER — Ambulatory Visit (INDEPENDENT_AMBULATORY_CARE_PROVIDER_SITE_OTHER): Payer: Medicaid Other | Admitting: Obstetrics & Gynecology

## 2013-02-18 ENCOUNTER — Ambulatory Visit (INDEPENDENT_AMBULATORY_CARE_PROVIDER_SITE_OTHER): Payer: Medicaid Other

## 2013-02-18 VITALS — BP 120/90 | Wt 384.8 lb

## 2013-02-18 DIAGNOSIS — O09899 Supervision of other high risk pregnancies, unspecified trimester: Secondary | ICD-10-CM

## 2013-02-18 DIAGNOSIS — O321XX1 Maternal care for breech presentation, fetus 1: Secondary | ICD-10-CM

## 2013-02-18 DIAGNOSIS — O30009 Twin pregnancy, unspecified number of placenta and unspecified number of amniotic sacs, unspecified trimester: Secondary | ICD-10-CM

## 2013-02-18 DIAGNOSIS — Z331 Pregnant state, incidental: Secondary | ICD-10-CM

## 2013-02-18 DIAGNOSIS — Z1389 Encounter for screening for other disorder: Secondary | ICD-10-CM

## 2013-02-18 DIAGNOSIS — O321XX0 Maternal care for breech presentation, not applicable or unspecified: Secondary | ICD-10-CM

## 2013-02-18 DIAGNOSIS — O09219 Supervision of pregnancy with history of pre-term labor, unspecified trimester: Secondary | ICD-10-CM

## 2013-02-18 LAB — POCT URINALYSIS DIPSTICK
Blood, UA: NEGATIVE
Glucose, UA: NEGATIVE
KETONES UA: NEGATIVE
LEUKOCYTES UA: NEGATIVE
NITRITE UA: NEGATIVE

## 2013-02-18 NOTE — Progress Notes (Signed)
U/S(37+2wks)-twin IUP (Di/Di), +membrane noted, Baby A-(maternal Rt side) BREECH (frank), BPP 8/8, fluid wnl single deepest pocket noted-4.6cm, posterior gr 3 placenta noted, female fetus "Sophia", FHR-145 bpm, UA Doppler RI-0.55 Baby B-(maternal Lt side) VERTEX, BPP 8/8, fluid wnl single deepest pocket noted-3.1cm, anterior Gr 2 placenta noted, female fetus "Mason", FHR-140 bpm, UA Doppler RI-0.60

## 2013-02-19 ENCOUNTER — Other Ambulatory Visit: Payer: Self-pay | Admitting: Obstetrics & Gynecology

## 2013-02-19 ENCOUNTER — Encounter (HOSPITAL_COMMUNITY): Payer: Self-pay | Admitting: Pharmacist

## 2013-02-19 ENCOUNTER — Other Ambulatory Visit: Payer: Medicaid Other

## 2013-02-19 NOTE — Progress Notes (Signed)
Sonogram reviewed and report done BP weight and urine results all reviewed and noted. Patient reports good fetal movement, denies any bleeding and no rupture of membranes symptoms or regular contractions. Patient is without complaints. All questions were answered. Trying to coordinate Caesarean section timing but having difficulty with scheduling NST Thursday

## 2013-02-21 ENCOUNTER — Encounter (HOSPITAL_COMMUNITY): Payer: Self-pay

## 2013-02-21 ENCOUNTER — Encounter (HOSPITAL_COMMUNITY)
Admission: RE | Admit: 2013-02-21 | Discharge: 2013-02-21 | Disposition: A | Discharge status: Home / Self Care | Payer: Medicaid Other | Source: Ambulatory Visit | Attending: Obstetrics and Gynecology | Admitting: Obstetrics and Gynecology

## 2013-02-21 ENCOUNTER — Other Ambulatory Visit: Payer: Medicaid Other | Admitting: Obstetrics & Gynecology

## 2013-02-21 HISTORY — DX: Gastro-esophageal reflux disease without esophagitis: K21.9

## 2013-02-21 LAB — RPR: RPR: NONREACTIVE

## 2013-02-21 LAB — CBC
HEMATOCRIT: 36.6 % (ref 36.0–46.0)
Hemoglobin: 12.7 g/dL (ref 12.0–15.0)
MCH: 30.1 pg (ref 26.0–34.0)
MCHC: 34.7 g/dL (ref 30.0–36.0)
MCV: 86.7 fL (ref 78.0–100.0)
PLATELETS: 189 10*3/uL (ref 150–400)
RBC: 4.22 MIL/uL (ref 3.87–5.11)
RDW: 13.2 % (ref 11.5–15.5)
WBC: 8.7 10*3/uL (ref 4.0–10.5)

## 2013-02-21 LAB — TYPE AND SCREEN
ABO/RH(D): O POS
Antibody Screen: NEGATIVE

## 2013-02-21 LAB — ABO/RH: ABO/RH(D): O POS

## 2013-02-21 MED ORDER — DEXTROSE 5 % IV SOLN
3.0000 g | INTRAVENOUS | Status: AC
  Administered 2013-02-22: 3 g via INTRAVENOUS
  Filled 2013-02-21: qty 3000

## 2013-02-21 MED ORDER — BUPIVACAINE LIPOSOME 1.3 % IJ SUSP
20.0000 mL | Freq: Once | INTRAMUSCULAR | Status: DC
  Filled 2013-02-21: qty 20

## 2013-02-21 NOTE — Patient Instructions (Signed)
20 Shelia MediaBrittany L Tallon  02/21/2013   Your procedure is scheduled on:  02/22/13  Enter through the Main Entrance of [facility] at 6 AM.  Pick up the phone at the desk and dial [phone].   Call this number if you have problems the morning of surgery: [phone]   Remember:   Do not eat food:After Midnight.  Do not drink clear liquids: After Midnight.  Take these medicines the morning of surgery with A SIP OF WATER: Prilosec, bring inhaler   Do not wear jewelry, make-up or nail polish.  Do not wear lotions, powders, or perfumes. You may wear deodorant.  Do not shave 48 hours prior to surgery.  Do not bring valuables to the hospital.  [facility] is not   responsible for any belongings or valuables brought to the hospital.  Contacts, dentures or bridgework may not be worn into surgery.  Leave suitcase in the car. After surgery it may be brought to your room.  For patients admitted to the hospital, checkout time is 11:00 AM the day of              discharge.   Patients discharged the day of surgery will not be allowed to drive             home.  Name and phone number of your driver: NA  Special Instructions:   Shower using CHG 2 nights before surgery and the night before surgery.  If you shower the day of surgery use CHG.  Use special wash - you have one bottle of CHG for all showers.  You should use approximately 1/3 of the bottle for each shower.   Please read over the following fact sheets that you were given:   Surgical Site Infection Prevention

## 2013-02-22 ENCOUNTER — Encounter (HOSPITAL_COMMUNITY): Payer: Self-pay | Admitting: *Deleted

## 2013-02-22 ENCOUNTER — Encounter (HOSPITAL_COMMUNITY)
Admission: RE | Disposition: A | Discharge status: Home / Self Care | Payer: Self-pay | Source: Ambulatory Visit | Attending: Obstetrics & Gynecology

## 2013-02-22 ENCOUNTER — Inpatient Hospital Stay (HOSPITAL_COMMUNITY)
Admission: RE | Admit: 2013-02-22 | Discharge: 2013-02-25 | DRG: 765 | Disposition: A | Discharge status: Home / Self Care | Payer: Medicaid Other | Source: Ambulatory Visit | Attending: Obstetrics & Gynecology | Admitting: Obstetrics & Gynecology

## 2013-02-22 ENCOUNTER — Inpatient Hospital Stay (HOSPITAL_COMMUNITY): Payer: Medicaid Other | Admitting: Anesthesiology

## 2013-02-22 ENCOUNTER — Encounter (HOSPITAL_COMMUNITY): Payer: Medicaid Other | Admitting: Anesthesiology

## 2013-02-22 DIAGNOSIS — E669 Obesity, unspecified: Secondary | ICD-10-CM | POA: Diagnosis present

## 2013-02-22 DIAGNOSIS — O30009 Twin pregnancy, unspecified number of placenta and unspecified number of amniotic sacs, unspecified trimester: Secondary | ICD-10-CM

## 2013-02-22 DIAGNOSIS — O09893 Supervision of other high risk pregnancies, third trimester: Secondary | ICD-10-CM

## 2013-02-22 DIAGNOSIS — Z6841 Body Mass Index (BMI) 40.0 and over, adult: Secondary | ICD-10-CM

## 2013-02-22 DIAGNOSIS — O30049 Twin pregnancy, dichorionic/diamniotic, unspecified trimester: Secondary | ICD-10-CM

## 2013-02-22 DIAGNOSIS — O09213 Supervision of pregnancy with history of pre-term labor, third trimester: Secondary | ICD-10-CM

## 2013-02-22 DIAGNOSIS — O0993 Supervision of high risk pregnancy, unspecified, third trimester: Secondary | ICD-10-CM

## 2013-02-22 DIAGNOSIS — O3090 Multiple gestation, unspecified, unspecified trimester: Principal | ICD-10-CM | POA: Diagnosis present

## 2013-02-22 DIAGNOSIS — Z87718 Personal history of other specified (corrected) congenital malformations of genitourinary system: Secondary | ICD-10-CM

## 2013-02-22 DIAGNOSIS — O99214 Obesity complicating childbirth: Secondary | ICD-10-CM

## 2013-02-22 DIAGNOSIS — O329XX0 Maternal care for malpresentation of fetus, unspecified, not applicable or unspecified: Principal | ICD-10-CM

## 2013-02-22 DIAGNOSIS — Z9889 Other specified postprocedural states: Secondary | ICD-10-CM

## 2013-02-22 DIAGNOSIS — O321XX0 Maternal care for breech presentation, not applicable or unspecified: Secondary | ICD-10-CM

## 2013-02-22 LAB — GLUCOSE, CAPILLARY: Glucose-Capillary: 86 mg/dL (ref 70–99)

## 2013-02-22 SURGERY — Surgical Case
Anesthesia: Spinal | Site: Abdomen

## 2013-02-22 MED ORDER — OXYTOCIN 40 UNITS IN LACTATED RINGERS INFUSION - SIMPLE MED: 62.5000 mL/h | INTRAVENOUS | Status: AC

## 2013-02-22 MED ORDER — ONDANSETRON HCL 4 MG/2ML IJ SOLN
INTRAMUSCULAR | Status: AC
  Filled 2013-02-22: qty 2

## 2013-02-22 MED ORDER — NALBUPHINE HCL 10 MG/ML IJ SOLN
5.0000 mg | INTRAMUSCULAR | Status: DC | PRN
  Filled 2013-02-22 (×2): qty 1

## 2013-02-22 MED ORDER — FENTANYL CITRATE 0.05 MG/ML IJ SOLN
INTRAMUSCULAR | Status: AC
  Filled 2013-02-22: qty 5

## 2013-02-22 MED ORDER — LIDOCAINE-EPINEPHRINE (PF) 2 %-1:200000 IJ SOLN
INTRAMUSCULAR | Status: AC
  Filled 2013-02-22: qty 40

## 2013-02-22 MED ORDER — NALOXONE HCL 1 MG/ML IJ SOLN
1.0000 ug/kg/h | INTRAVENOUS | Status: DC | PRN
  Filled 2013-02-22: qty 2

## 2013-02-22 MED ORDER — LACTATED RINGERS IV SOLN
40.0000 [IU] | INTRAVENOUS | Status: DC | PRN
  Administered 2013-02-22: 40 [IU] via INTRAVENOUS

## 2013-02-22 MED ORDER — BUPIVACAINE LIPOSOME 1.3 % IJ SUSP
INTRAMUSCULAR | Status: DC | PRN
Start: 2013-02-22 — End: 2013-02-22
  Administered 2013-02-22: 20 mL

## 2013-02-22 MED ORDER — FENTANYL CITRATE 0.05 MG/ML IJ SOLN
INTRAMUSCULAR | Status: AC
  Filled 2013-02-22: qty 2

## 2013-02-22 MED ORDER — ALBUTEROL SULFATE (2.5 MG/3ML) 0.083% IN NEBU
3.0000 mL | INHALATION_SOLUTION | Freq: Four times a day (QID) | RESPIRATORY_TRACT | Status: DC | PRN
  Filled 2013-02-22: qty 3

## 2013-02-22 MED ORDER — SENNOSIDES-DOCUSATE SODIUM 8.6-50 MG PO TABS
2.0000 | ORAL_TABLET | ORAL | Status: DC
  Administered 2013-02-23 – 2013-02-24 (×3): 2 via ORAL
  Filled 2013-02-22 (×3): qty 2

## 2013-02-22 MED ORDER — SCOPOLAMINE 1 MG/3DAYS TD PT72
1.0000 | MEDICATED_PATCH | Freq: Once | TRANSDERMAL | Status: DC
  Administered 2013-02-22: 1.5 mg via TRANSDERMAL

## 2013-02-22 MED ORDER — TETANUS-DIPHTH-ACELL PERTUSSIS 5-2.5-18.5 LF-MCG/0.5 IM SUSP
0.5000 mL | Freq: Once | INTRAMUSCULAR | Status: AC
  Administered 2013-02-23: 0.5 mL via INTRAMUSCULAR
  Filled 2013-02-22: qty 0.5

## 2013-02-22 MED ORDER — PHENYLEPHRINE 8 MG IN D5W 100 ML (0.08MG/ML) PREMIX OPTIME
INJECTION | INTRAVENOUS | Status: DC | PRN
  Administered 2013-02-22: 60 ug/min via INTRAVENOUS

## 2013-02-22 MED ORDER — SODIUM CHLORIDE 0.9 % IJ SOLN
INTRAMUSCULAR | Status: AC
  Filled 2013-02-22: qty 50

## 2013-02-22 MED ORDER — LIDOCAINE-EPINEPHRINE 2 %-1:100000 IJ SOLN: INTRAMUSCULAR | Status: DC | PRN

## 2013-02-22 MED ORDER — SIMETHICONE 80 MG PO CHEW
80.0000 mg | CHEWABLE_TABLET | Freq: Three times a day (TID) | ORAL | Status: DC
  Administered 2013-02-22 – 2013-02-25 (×9): 80 mg via ORAL
  Filled 2013-02-22 (×10): qty 1

## 2013-02-22 MED ORDER — EPHEDRINE SULFATE 50 MG/ML IJ SOLN
INTRAMUSCULAR | Status: DC | PRN
  Administered 2013-02-22: 5 mg via INTRAVENOUS
  Administered 2013-02-22: 10 mg via INTRAVENOUS
  Administered 2013-02-22: 15 mg via INTRAVENOUS
  Administered 2013-02-22: 10 mg via INTRAVENOUS
  Administered 2013-02-22: 15 mg via INTRAVENOUS

## 2013-02-22 MED ORDER — LANOLIN HYDROUS EX OINT: 1.0000 "application " | TOPICAL_OINTMENT | CUTANEOUS | Status: DC | PRN

## 2013-02-22 MED ORDER — MORPHINE SULFATE (PF) 0.5 MG/ML IJ SOLN
INTRAMUSCULAR | Status: DC | PRN
  Administered 2013-02-22: 4 mg via EPIDURAL

## 2013-02-22 MED ORDER — FENTANYL CITRATE 0.05 MG/ML IJ SOLN
INTRAMUSCULAR | Status: AC
Start: 2013-02-22 — End: 2013-02-22
  Filled 2013-02-22: qty 2

## 2013-02-22 MED ORDER — SCOPOLAMINE 1 MG/3DAYS TD PT72: 1.0000 | MEDICATED_PATCH | Freq: Once | TRANSDERMAL | Status: DC

## 2013-02-22 MED ORDER — ZOLPIDEM TARTRATE 5 MG PO TABS
5.0000 mg | ORAL_TABLET | Freq: Every evening | ORAL | Status: DC | PRN
  Administered 2013-02-22: 5 mg via ORAL
  Filled 2013-02-22: qty 1

## 2013-02-22 MED ORDER — BISACODYL 10 MG RE SUPP: 10.0000 mg | Freq: Every day | RECTAL | Status: DC | PRN

## 2013-02-22 MED ORDER — ONDANSETRON HCL 4 MG PO TABS: 4.0000 mg | ORAL_TABLET | ORAL | Status: DC | PRN

## 2013-02-22 MED ORDER — NALOXONE HCL 0.4 MG/ML IJ SOLN: 0.4000 mg | INTRAMUSCULAR | Status: DC | PRN

## 2013-02-22 MED ORDER — MENTHOL 3 MG MT LOZG: 1.0000 | LOZENGE | OROMUCOSAL | Status: DC | PRN

## 2013-02-22 MED ORDER — ONDANSETRON HCL 4 MG/2ML IJ SOLN
INTRAMUSCULAR | Status: DC | PRN
  Administered 2013-02-22: 4 mg via INTRAVENOUS

## 2013-02-22 MED ORDER — DIBUCAINE 1 % RE OINT: 1.0000 "application " | TOPICAL_OINTMENT | RECTAL | Status: DC | PRN

## 2013-02-22 MED ORDER — SIMETHICONE 80 MG PO CHEW
80.0000 mg | CHEWABLE_TABLET | ORAL | Status: DC
  Administered 2013-02-23: 80 mg via ORAL
  Filled 2013-02-22: qty 1

## 2013-02-22 MED ORDER — PHENYLEPHRINE 8 MG IN D5W 100 ML (0.08MG/ML) PREMIX OPTIME
INJECTION | INTRAVENOUS | Status: AC
Start: 2013-02-22 — End: 2013-02-22
  Filled 2013-02-22: qty 100

## 2013-02-22 MED ORDER — WITCH HAZEL-GLYCERIN EX PADS: 1.0000 "application " | MEDICATED_PAD | CUTANEOUS | Status: DC | PRN

## 2013-02-22 MED ORDER — SODIUM BICARBONATE 8.4 % IV SOLN
INTRAVENOUS | Status: DC | PRN
  Administered 2013-02-22: 4 mL via EPIDURAL
  Administered 2013-02-22 (×2): 5 mL via EPIDURAL
  Administered 2013-02-22: 3 mL via EPIDURAL
  Administered 2013-02-22: 5 mL via EPIDURAL

## 2013-02-22 MED ORDER — FLEET ENEMA 7-19 GM/118ML RE ENEM: 1.0000 | ENEMA | Freq: Every day | RECTAL | Status: DC | PRN

## 2013-02-22 MED ORDER — SODIUM CHLORIDE 0.9 % IJ SOLN
INTRAMUSCULAR | Status: DC | PRN
  Administered 2013-02-22: 40 mL via INTRAVENOUS

## 2013-02-22 MED ORDER — LACTATED RINGERS IV SOLN
Freq: Once | INTRAVENOUS | Status: AC
  Administered 2013-02-22: 06:00:00 via INTRAVENOUS

## 2013-02-22 MED ORDER — SODIUM BICARBONATE 8.4 % IV SOLN
INTRAVENOUS | Status: AC
  Filled 2013-02-22: qty 50

## 2013-02-22 MED ORDER — OXYTOCIN 10 UNIT/ML IJ SOLN
INTRAMUSCULAR | Status: AC
Start: 2013-02-22 — End: 2013-02-22
  Filled 2013-02-22: qty 4

## 2013-02-22 MED ORDER — ONDANSETRON HCL 4 MG/2ML IJ SOLN: 4.0000 mg | INTRAMUSCULAR | Status: DC | PRN

## 2013-02-22 MED ORDER — ONDANSETRON HCL 4 MG/2ML IJ SOLN: 4.0000 mg | Freq: Three times a day (TID) | INTRAMUSCULAR | Status: DC | PRN

## 2013-02-22 MED ORDER — LACTATED RINGERS IV SOLN
INTRAVENOUS | Status: DC | PRN
  Administered 2013-02-22 (×4): via INTRAVENOUS

## 2013-02-22 MED ORDER — FENTANYL CITRATE 0.05 MG/ML IJ SOLN
INTRAMUSCULAR | Status: DC | PRN
  Administered 2013-02-22 (×2): 50 ug via INTRAVENOUS
  Administered 2013-02-22: 75 ug via INTRAVENOUS
  Administered 2013-02-22: 50 ug via INTRAVENOUS

## 2013-02-22 MED ORDER — FENTANYL CITRATE 0.05 MG/ML IJ SOLN
50.0000 ug | INTRAMUSCULAR | Status: DC | PRN
  Administered 2013-02-22 (×2): 50 ug via INTRAVENOUS

## 2013-02-22 MED ORDER — SIMETHICONE 80 MG PO CHEW: 80.0000 mg | CHEWABLE_TABLET | ORAL | Status: DC | PRN

## 2013-02-22 MED ORDER — MORPHINE SULFATE 0.5 MG/ML IJ SOLN
INTRAMUSCULAR | Status: AC
Start: 2013-02-22 — End: 2013-02-22
  Filled 2013-02-22: qty 10

## 2013-02-22 MED ORDER — DIPHENHYDRAMINE HCL 25 MG PO CAPS
25.0000 mg | ORAL_CAPSULE | ORAL | Status: DC | PRN
  Administered 2013-02-22: 25 mg via ORAL
  Filled 2013-02-22 (×2): qty 1

## 2013-02-22 MED ORDER — LACTATED RINGERS IV SOLN
INTRAVENOUS | Status: DC
  Administered 2013-02-22: 18:00:00 via INTRAVENOUS

## 2013-02-22 MED ORDER — DIPHENHYDRAMINE HCL 50 MG/ML IJ SOLN: 12.5000 mg | INTRAMUSCULAR | Status: DC | PRN

## 2013-02-22 MED ORDER — OXYCODONE-ACETAMINOPHEN 5-325 MG PO TABS
1.0000 | ORAL_TABLET | ORAL | Status: DC | PRN
  Administered 2013-02-22 – 2013-02-25 (×15): 2 via ORAL
  Filled 2013-02-22 (×15): qty 2

## 2013-02-22 MED ORDER — SODIUM CHLORIDE 0.9 % IJ SOLN: 3.0000 mL | INTRAMUSCULAR | Status: DC | PRN

## 2013-02-22 MED ORDER — MEPERIDINE HCL 25 MG/ML IJ SOLN: 6.2500 mg | INTRAMUSCULAR | Status: DC | PRN

## 2013-02-22 MED ORDER — SCOPOLAMINE 1 MG/3DAYS TD PT72
MEDICATED_PATCH | TRANSDERMAL | Status: AC
  Administered 2013-02-22: 06:00:00 1.5 mg via TRANSDERMAL
  Filled 2013-02-22: qty 1

## 2013-02-22 MED ORDER — DIPHENHYDRAMINE HCL 25 MG PO CAPS: 25.0000 mg | ORAL_CAPSULE | Freq: Four times a day (QID) | ORAL | Status: DC | PRN

## 2013-02-22 MED ORDER — DIPHENHYDRAMINE HCL 50 MG/ML IJ SOLN: 25.0000 mg | INTRAMUSCULAR | Status: DC | PRN

## 2013-02-22 MED ORDER — PRENATAL MULTIVITAMIN CH
1.0000 | ORAL_TABLET | Freq: Every day | ORAL | Status: DC
  Administered 2013-02-23 – 2013-02-24 (×2): 1 via ORAL
  Filled 2013-02-22 (×2): qty 1

## 2013-02-22 MED ORDER — NALBUPHINE HCL 10 MG/ML IJ SOLN
5.0000 mg | INTRAMUSCULAR | Status: DC | PRN
  Administered 2013-02-23: 10 mg via SUBCUTANEOUS
  Filled 2013-02-22: qty 1

## 2013-02-22 MED ORDER — IBUPROFEN 600 MG PO TABS
600.0000 mg | ORAL_TABLET | Freq: Four times a day (QID) | ORAL | Status: DC
  Administered 2013-02-22 – 2013-02-25 (×11): 600 mg via ORAL
  Filled 2013-02-22 (×10): qty 1

## 2013-02-22 MED ORDER — EPHEDRINE 5 MG/ML INJ
INTRAVENOUS | Status: AC
  Filled 2013-02-22: qty 20

## 2013-02-22 SURGICAL SUPPLY — 37 items
CLAMP CORD UMBIL (MISCELLANEOUS) IMPLANT
CLOTH BEACON ORANGE TIMEOUT ST (SAFETY) ×3 IMPLANT
DERMABOND ADVANCED (GAUZE/BANDAGES/DRESSINGS) ×4
DERMABOND ADVANCED .7 DNX12 (GAUZE/BANDAGES/DRESSINGS) ×2 IMPLANT
DRAPE LG THREE QUARTER DISP (DRAPES) IMPLANT
DRSG OPSITE POSTOP 4X10 (GAUZE/BANDAGES/DRESSINGS) ×3 IMPLANT
DURAPREP 26ML APPLICATOR (WOUND CARE) ×6 IMPLANT
ELECT REM PT RETURN 9FT ADLT (ELECTROSURGICAL) ×3
ELECTRODE REM PT RTRN 9FT ADLT (ELECTROSURGICAL) ×1 IMPLANT
EXTRACTOR VACUUM BELL STYLE (SUCTIONS) IMPLANT
GLOVE BIOGEL PI IND STRL 8 (GLOVE) ×1 IMPLANT
GLOVE BIOGEL PI INDICATOR 8 (GLOVE) ×2
GLOVE ECLIPSE 8.0 STRL XLNG CF (GLOVE) ×3 IMPLANT
GOWN PREVENTION PLUS XLARGE (GOWN DISPOSABLE) ×3 IMPLANT
GOWN STRL REIN XL XLG (GOWN DISPOSABLE) ×3 IMPLANT
KIT ABG SYR 3ML LUER SLIP (SYRINGE) ×3 IMPLANT
NEEDLE HYPO 18GX1.5 BLUNT FILL (NEEDLE) ×3 IMPLANT
NEEDLE HYPO 22GX1.5 SAFETY (NEEDLE) ×3 IMPLANT
NEEDLE HYPO 25X5/8 SAFETYGLIDE (NEEDLE) ×3 IMPLANT
NS IRRIG 1000ML POUR BTL (IV SOLUTION) ×3 IMPLANT
PACK C SECTION WH (CUSTOM PROCEDURE TRAY) ×3 IMPLANT
PAD OB MATERNITY 4.3X12.25 (PERSONAL CARE ITEMS) ×3 IMPLANT
RTRCTR C-SECT PINK 25CM LRG (MISCELLANEOUS) ×3 IMPLANT
STAPLER VISISTAT 35W (STAPLE) IMPLANT
SUT CHROMIC 0 CT 1 (SUTURE) ×3 IMPLANT
SUT MNCRL 0 VIOLET CTX 36 (SUTURE) ×2 IMPLANT
SUT MONOCRYL 0 CTX 36 (SUTURE) ×4
SUT PLAIN 2 0 (SUTURE)
SUT PLAIN 2 0 XLH (SUTURE) IMPLANT
SUT PLAIN ABS 2-0 CT1 27XMFL (SUTURE) IMPLANT
SUT VIC AB 0 CTX 36 (SUTURE) ×2
SUT VIC AB 0 CTX36XBRD ANBCTRL (SUTURE) ×1 IMPLANT
SUT VIC AB 4-0 KS 27 (SUTURE) IMPLANT
SYR 20CC LL (SYRINGE) ×6 IMPLANT
TOWEL OR 17X24 6PK STRL BLUE (TOWEL DISPOSABLE) ×3 IMPLANT
TRAY FOLEY CATH 14FR (SET/KITS/TRAYS/PACK) IMPLANT
WATER STERILE IRR 1000ML POUR (IV SOLUTION) ×3 IMPLANT

## 2013-02-22 NOTE — Consult Note (Signed)
Neonatology Note:   Attendance at C-section:    I was asked by Dr. Despina HiddenEure to attend this primary C/S at 37 3/7 weeks due to twin gestation with breech presentation of Twin A. 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 She was admitted at [redacted] weeks GA for possible ROM and got 2 doses of Betamethasone. Prior to the C-section, she was hypotensive while lying flat on the operating table.  ROM at delivery, fluid around Twin A noted to be brown and turbid.  Twin A, a female, delivered vertex and was floppy and apneic at birth but had a HR > 100. We bulb suctioned and gave vigorous stimulation, and he began to breathe, then cry. Color and tone improved gradually. A pulse oximeter was placed and his O2 saturations were 60-70% after 5 minutes of life, so BBO2 was given. When we tried to withdraw the supplemental O2, his saturations would drop again. He maintained O2 saturations at about 90% in BBO2, so the decision was made to transfer him to NICU. He was seen briefly by his parents in the OR.  Ap 5/9. Lungs clear to ausc in DR.   Twin B, a female, was delivered frank breech through brown amniotic fluid. She was flaccid, apneic, and pale, with a HR of 40 at birth. We quickly bulb suctioned and then applied PPV. Good chest excursion was noted and the HR rose gradually. After about 2 min of PPV, we gave vigorous stimulation and the baby had only gasping respirations. She continued to have no tone, and the HR was > 100, with pink color centrally (due to bagging). We resumed PPV and, when no improvement was seen in spontaneous respirations, she was intubated with a 3.0 mm ETT at 5 min of life. The CO2 detector immediately turned yellow. The tube was secured at 8.5 cm at the lip and good, equal breath sounds could be heard. The baby's tone continued to be markedly decreased, but not absent, and she was breathing occasionally on  her own by 7-8 minutes. Ap 1/5/7. I spoke with both parents in the OR and they were able to see the baby briefly before we transported her to the NICU for further care. Her father accompanied her to the NICU.   Alice Souhristie C. Claudie Brickhouse, MD

## 2013-02-22 NOTE — Anesthesia Postprocedure Evaluation (Signed)
Anesthesia Post Note  Patient: Alice [name]  Procedure(s) Performed: Procedure(s) (LRB): CESAREAN SECTION/TWINS (N/A)  Anesthesia type: Epidural  Patient location: PACU  Post pain: Pain level controlled  Post assessment: Post-op Vital signs reviewed  Last Vitals:  Filed Vitals:   02/22/13 0558  BP: 151/89  Pulse: 83  Temp: 36.7 C  Resp: 18    Post vital signs: Reviewed  Level of consciousness: awake  Complications: No apparent anesthesia complications

## 2013-02-22 NOTE — Brief Op Note (Cosign Needed)
Alice MediaBrittany L Righi PROCEDURE DATE: 02/22/2013  PREOPERATIVE DIAGNOSES: Intrauterine pregnancy at  2631w6d weeks gestation; malpresentation: vtx, breech and Multiple gestation, and body habitus.  POSTOPERATIVE DIAGNOSES: The same  PROCEDURE: Primary Low Transverse Cesarean Section  SURGEON:  Dr. Despina HiddenEure   ASSISTANT:  Dr. Ike Benedom   ANESTHESIOLOGIST: Dr. Haskell RilingFreedman  INDICATIONS: Alice [name] is a 28 y.o. G2P0101 at 3431w6d here for cesarean section secondary to the indications listed under preoperative diagnoses; please see preoperative note for further details.  The risks of cesarean section were discussed with the patient including but were not limited to: bleeding which may require transfusion or reoperation; infection which may require antibiotics; injury to bowel, bladder, ureters or other surrounding organs; injury to the fetus; need for additional procedures including hysterectomy in the event of a life-threatening hemorrhage; placental abnormalities wth subsequent pregnancies, incisional problems, thromboembolic phenomenon and other postoperative/anesthesia complications.   The patient concurred with the proposed plan, giving informed written consent for the procedure.    FINDINGS:  Viable female infant A in cephalic presentation.  Apgars 5 and 9.  Clear amniotic fluid. Viable female infant b  in cephalic presentation.  Apgars 1, 5 and 7. Infant B required intubation for airway control by NICU.  Clear amniotic fluid  Intact placenta, three vessel cord.  Normal uterus, fallopian tubes and ovaries bilaterally.  ANESTHESIA: Spinal Attempted, because of body habitus required Epidural INTRAVENOUS FLUIDS: 3400 ml ESTIMATED BLOOD LOSS: 1500 ml URINE OUTPUT:  175 ml SPECIMENS: Placenta sent to pathology COMPLICATIONS: Pt was unable to tolerate taping of abdomen to visualize suprapubic region 2/2 venous return syndrome.   PROCEDURE IN DETAIL:  The patient preoperatively received intravenous antibiotics  and had sequential compression devices applied to her lower extremities.  She was then taken to the operating room where spinal anesthesia was attempted then moved to epidural anesthesia that was dosed up to surgical level and was found to be adequate. She was then placed in a dorsal supine position with a leftward tilt, and prepped and draped in a sterile manner by Dr. Despina HiddenEure.  A foley catheter was placed into her bladder and attached to constant gravity.  After an adequate timeout was performed, a Pfannenstiel skin incision was made with scalpel and carried through to the underlying layer of fascia. The fascia was incised in the midline, and this incision was extended bilaterally using the Mayo scissors.  Kocher clamps were applied to the superior aspect of the fascial incision and the underlying rectus muscles were dissected off with mayo and bluntly. A similar process was carried out on the inferior aspect of the fascial incision. The rectus muscles were separated in the midline bluntly and the peritoneum was entered bluntly. An alexis was then placed for better visualization and retraction. Attention was turned to the lower uterine segment where a low transverse hysterotomy was made with a scalpel and extended bilaterally bluntly.  Infant A  was successfully delivered vertex, the cord was clamped and cut and the infant was handed over to awaiting neonatology team. Infant B was successfully delivered breech, the cord was clamped and cut and the infant was handed over to NICU team. Significant uterine bleeding was appreciated with  application of ring forceps for hemorrhage control. Uterine massage was then administered, and the placenta delivered intact with a three-vessel cord. The uterus was delivered, then then cleared of clot and debris.  The hysterotomy was closed with 0 monocryl in a running locked fashion, and an imbricating layer was also placed  with 0 monocryl in a running fashion. The pelvis was cleared  of all clot and debris. Hemostasis was confirmed on all surfaces. The uterus was replaced in the abdomen with persistent hemostasis. The abdomen was irrigated.  The peritoneum and the muscles were reapproximated using 2.0 Vicryl interrupted stitches. The fascia was then closed using 0 Vicryl in a running fashion.  The subcutaneous layer was irrigated, then reapproximated with 2-0 plain gut interrupted stitches, and 45 ml of Xperil was injected subcutaneously around the incision.  The skin was closed with a 4-0 Vicryl subcuticular stitch. The patient tolerated the procedure well. Sponge, lap, instrument and needle counts were correct x 2.  She was taken to the recovery room in stable condition.   Tawana Scale, MD OB Fellow

## 2013-02-22 NOTE — H&P (Signed)
Preoperative History and Physical  Alice [name] is a 28 y.o. G2P0101 Estimated Date of Delivery: 03/09/13 [redacted]w[redacted]d with twin gestation. Patient's last menstrual period was 06/02/2012. admitted for a primary Caesarean section.  Twin A is breech and twin B is vertex.  I consulted Dr Claudean Severance regarding timing of delivery and he was supportive of delivery between 37 and 38 weeks, leaning toward the 38 weeks if possible.  PMH:    Past Medical History  Diagnosis Date  . Congenital obstruction of ureteropelvic junction (UPJ)   . History of kidney stones   . 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 History of recurrent UTIs   . Round ligament pain 09/10/2012  . Morbid obesity   . GERD (gastroesophageal reflux disease)     PSH:     Past Surgical History  Procedure Laterality Date  . Nephrectomy      right side  . Nasal sinus surgery    . Tonsillectomy    . Other surgical history      ulner nerve removal  . Addenoidectomy      POb/GynH:      OB History   Grav Para Term Preterm Abortions TAB SAB Ect Mult Living   2 1  1      1       SH:   History  Substance Use Topics  . Smoking status: Former Smoker    Types: Cigarettes    Quit date: 06/21/2012  . Smokeless tobacco: Never Used  . Alcohol Use: No    FH:    Family History  Problem Relation Age of Onset  . Hypertension Mother   . Hyperlipidemia Mother   . Heart disease Father     MI  . Alcohol abuse Father   . Alcohol abuse Brother   . Diabetes Maternal Grandmother   . Hyperlipidemia Maternal Grandmother      Allergies:  Allergies  Allergen Reactions  . Cinnamon Flavor Swelling    Artificial cinnamon causes tongue to swell  . Codeine Nausea Only    Straight codeine only Percocet & vicodin are tolerated by pt.  . Ibuprofen Other (See Comments)    Causes severe pain in stomach Pt can tolerate if used sparingly  . Levofloxacin Hives  . Reglan [Metoclopramide] Other (See Comments)     REACTION: causes skin to "crawl"    Medications:      Current facility-administered medications:bupivacaine liposome (EXPAREL) 1.3 % injection 266 mg, 20 mL, Infiltration, Once, [name], MD;  ceFAZolin (ANCEF) 3 g in dextrose 5 % 50 mL IVPB, 3 g, Intravenous, On Call to OR, Tilda Burrow, MD;  scopolamine (TRANSDERM-SCOP) 1.5 MG 1.5 mg, 1 patch, Transdermal, Once, Leilani Able, MD, 1.5 mg at 02/22/13 1610  Review of Systems:   Review of Systems  Constitutional: Negative for fever, chills, weight loss, malaise/fatigue and diaphoresis.  HENT: Negative for hearing loss, ear pain, nosebleeds, congestion, sore throat, neck pain, tinnitus and ear discharge.   Eyes: Negative for blurred vision, double vision, photophobia, pain, discharge and redness.  Respiratory: Negative for cough, hemoptysis, sputum production, shortness of breath, wheezing and stridor.   Cardiovascular: Negative for chest pain, palpitations, orthopnea, claudication, leg swelling and PND.  Gastrointestinal: Positive for abdominal pain. Negative for heartburn, nausea, vomiting, diarrhea, constipation, blood in stool and melena.  Genitourinary: Negative for dysuria, urgency, frequency, hematuria and flank pain.  Musculoskeletal: Negative for myalgias, back pain, joint pain and falls.  Skin: Negative for itching  and rash.  Neurological: Negative for dizziness, tingling, tremors, sensory change, speech change, focal weakness, seizures, loss of consciousness, weakness and headaches.  Endo/Heme/Allergies: Negative for environmental allergies and polydipsia. Does not bruise/bleed easily.  Psychiatric/Behavioral: Negative for depression, suicidal ideas, hallucinations, memory loss and substance abuse. The patient is not nervous/anxious and does not have insomnia.      PHYSICAL EXAM:  Blood pressure 151/89, pulse 83, temperature 98.1 F (36.7 C), temperature source Oral, resp. rate 18, last menstrual period 06/02/2012, SpO2  100.00%.    Vitals reviewed. Constitutional: She is oriented to person, place, and time. She appears well-developed and well-nourished.  HENT:  Head: Normocephalic and atraumatic.  Right Ear: External ear normal.  Left Ear: External ear normal.  Nose: Nose normal.  Mouth/Throat: Oropharynx is clear and moist.  Eyes: Conjunctivae and EOM are normal. Pupils are equal, round, and reactive to light. Right eye exhibits no discharge. Left eye exhibits no discharge. No scleral icterus.  Neck: Normal range of motion. Neck supple. No tracheal deviation present. No thyromegaly present.  Cardiovascular: Normal rate, regular rhythm, normal heart sounds and intact distal pulses.  Exam reveals no gallop and no friction rub.   No murmur heard. Respiratory: Effort normal and breath sounds normal. No respiratory distress. She has no wheezes. She has no rales. She exhibits no tenderness.  GI: Soft. Bowel sounds are normal.  There is no tenderness. There is no rebound and no guarding.  Genitourinary:       Vulva is normal without lesions Vagina is pink moist without discharge Cervix normal in appearance and pap is normal Uterus is enlarged to U+26 cm Adnexa is negative with normal sized ovaries by sonogram in first trimester Musculoskeletal: Normal range of motion. She exhibits no edema and no tenderness.  Neurological: She is alert and oriented to person, place, and time. She has normal reflexes. She displays normal reflexes. No cranial nerve deficit. She exhibits normal muscle tone. Coordination normal.  Skin: Skin is warm and dry. No rash noted. No erythema. No pallor.  Psychiatric: She has a normal mood and affect. Her behavior is normal. Judgment and thought content normal.    Labs: Results for orders placed during the hospital encounter of 02/21/13 (from the past 336 hour(s))  ABO/RH   Collection Time    02/21/13  1:25 PM      Result Value Range   ABO/RH(D) O POS    CBC   Collection Time     02/21/13  1:33 PM      Result Value Range   WBC 8.7  4.0 - 10.5 K/uL   RBC 4.22  3.87 - 5.11 MIL/uL   Hemoglobin 12.7  12.0 - 15.0 g/dL   HCT 16.1  09.6 - 04.5 %   MCV 86.7  78.0 - 100.0 fL   MCH 30.1  26.0 - 34.0 pg   MCHC 34.7  30.0 - 36.0 g/dL   RDW 40.9  81.1 - 91.4 %   Platelets 189  150 - 400 K/uL  RPR   Collection Time    02/21/13  1:33 PM      Result Value Range   RPR NON REACTIVE  NON REACTIVE  TYPE AND SCREEN   Collection Time    02/21/13  1:33 PM      Result Value Range   ABO/RH(D) O POS     Antibody Screen NEG     Sample Expiration 02/24/2013    Results for orders placed in visit on 02/18/13 (  from the past 336 hour(s))  POCT URINALYSIS DIPSTICK   Collection Time    02/18/13  3:32 PM      Result Value Range   Color, UA       Clarity, UA       Glucose, UA neg     Bilirubin, UA       Ketones, UA neg     Spec Grav, UA       Blood, UA neg     pH, UA       Protein, UA trace     Urobilinogen, UA       Nitrite, UA neg     Leukocytes, UA Negative    Results for orders placed in visit on 02/15/13 (from the past 336 hour(s))  POCT URINALYSIS DIPSTICK   Collection Time    02/15/13  9:44 AM      Result Value Range   Color, UA       Clarity, UA       Glucose, UA neg     Bilirubin, UA       Ketones, UA neg     Spec Grav, UA       Blood, UA neg     pH, UA       Protein, UA neg     Urobilinogen, UA       Nitrite, UA neg     Leukocytes, UA Negative    Results for orders placed in visit on 02/12/13 (from the past 336 hour(s))  POCT URINALYSIS DIPSTICK   Collection Time    02/12/13  4:21 PM      Result Value Range   Color, UA       Clarity, UA       Glucose, UA neg     Bilirubin, UA       Ketones, UA neg     Spec Grav, UA       Blood, UA trace     pH, UA       Protein, UA neg     Urobilinogen, UA       Nitrite, UA neg     Leukocytes, UA Trace      EKG: Orders placed during the hospital encounter of 06/20/10  . EKG  . EKG    Imaging  Studies: US Ob Follow Up  02/12/2013   TWINS FOLLOW UP SONOGRAM   [place] is in the office for a follow up sonogram of a twin  gestation.  She is a 28 y.o. year old G28P0101 with Estimated Date of Delivery: 03/09/13  now at  [redacted]w[redacted]d weeks gestation. Thus far the pregnancy has been otherwise  complicated by [name]/o PTd and elevated LFT's.   The twins are dichorionic/diamniotic.   TWIN A GESTATION:  PRESENTATION: breech FRANK  FETAL ACTIVITY:          Heart rate         137 bpm          The fetus is active.  AMNIOTIC FLUID: The amniotic fluid volume is  normal, single deepest pocket=2.4cm  PLACENTA LOCALIZATION:   GRADE 2  CERVIX: Unable to visualize  ADNEXA: The adnexa appears normal.   GESTATIONAL AGE AND  BIOMETRICS:  Gestational criteria: Estimated Date of Delivery: 03/09/13  now at [redacted]w[redacted]d  Previous Scans:10              BIPARIETAL DIAMETER           8.77 cm  35+3 weeks  HEAD CIRCUMFERENCE           30.88 cm         34+3 weeks  ABDOMINAL CIRCUMFERENCE           30.69 cm         34+4 weeks  FEMUR LENGTH           6.65 cm         34+2 weeks                                                           AVERAGE EGA(BY THIS SCAN):   34+5 weeks                                                 ESTIMATED FETAL WEIGHT:        2495  grams, 18th %     ANATOMICAL SURVEY                                                                             COMMENTS CEREBRAL VENTRICLES yes normal   CHOROID PLEXUS yes normal   CEREBELLUM yes normal   CISTERNA MAGNA yes normal   NUCHAL REGION yes normal   ORBITS yes normal   NASAL BONE yes normal   NOSE/LIP yes normal   FACIAL PROFILE yes normal   4 CHAMBERED HEART yes normal   OUTFLOW TRACTS     DIAPHRAGM yes normal   STOMACH yes normal   RENAL REGION     BLADDER yes normal   CORD INSERTION     3 VESSEL CORD yes normal   SPINE     ARMS/HANDS     LEGS/FEET     GENITALIA   female        BIOPHYSCIAL PROFILE:                                                                                                        COMMENTS GROSS BODY MOVEMENT                 2   TONE                2   RESPIRATIONS                2   AMNIOTIC FLUID                2  SCORE:  8/8 (Note: NST was not performed as part of this antepartum testing)   DOPPLER FLOW STUDIES: UMBILICAL ARTERY RI RATIOS:   0.60, 0.53    SUSPECTED ABNORMALITIES:  no  QUALITY OF SCAN: Satisfactory    TWIN B  GESTATION:   PRESENTATION: cephalic  FETAL ACTIVITY:          Heart rate         151 bpm          The fetus is active.  AMNIOTIC FLUID: The amniotic fluid volume is  normal, single deepest pocket=2.2 cm.  PLACENTA LOCALIZATION:  anterior GRADE 2  GESTATIONAL AGE AND  BIOMETRICS:  Gestational criteria: Estimated Date of Delivery: 03/09/13  now at [redacted]w[redacted]d  Previous Scans:10              BIPARIETAL DIAMETER           8.68 cm         35+0 weeks  HEAD CIRCUMFERENCE           30.65 cm         34+1 weeks  ABDOMINAL CIRCUMFERENCE           29.16 cm         33+1 weeks  FEMUR LENGTH           6.93 cm         35+4 weeks                                                           AVERAGE EGA(BY THIS SCAN):   34+3 weeks                                                 ESTIMATED FETAL WEIGHT:        2391  grams, 13th %     ANATOMICAL SURVEY                                                                             COMMENTS CEREBRAL VENTRICLES yes normal   CHOROID PLEXUS yes normal   CEREBELLUM yes normal   CISTERNA MAGNA yes normal   NUCHAL REGION yes normal   ORBITS     NASAL BONE     NOSE/LIP     FACIAL PROFILE     4 CHAMBERED HEART     OUTFLOW TRACTS     DIAPHRAGM yes normal   STOMACH yes normal   RENAL REGION     BLADDER yes normal   CORD INSERTION     3 VESSEL CORD yes normal   SPINE     ARMS/HANDS     LEGS/FEET     GENITALIA   female        BIOPHYSCIAL PROFILE:  COMMENTS GROSS BODY MOVEMENT                  2   TONE                2   RESPIRATIONS                2   AMNIOTIC FLUID                2                                                            SCORE:  8/8 (Note: NST was not performed as part of this antepartum testing)   DOPPLER FLOW STUDIES: UMBILICAL ARTERY RI RATIOS:   0.61, 0.56    SUSPECTED ABNORMALITIES:  no  QUALITY OF SCAN: Satisfactory    The current discrepancy in  fetal weight between the twins is 4% which  is  not clinically relevant.   TECHNICIAN COMMENTS:  U/S(36+3wks)-twin IUP DI/DI, + membrane noted, EFW discordance - 4% Baby A-maternal Rt side- BREECH (FRANK), FHR-137 bpm, EFW 5 lb 8 oz  (18th%tile), fluid wnl single deepest pocket-2.4cm, UA Doppler RI- 0.60 &  0.53, female fetus "Sophia", BPP 8/8 Baby B-maternal Lt side, VTX, FHR-151 bpm, EFW 5 lb 4 oz (13th%tile),  fluid wnl single deepest pocket-2.2cm, UA Doppler RI -0.61 & 0.56, female  fetus "Mason", BPP 8/8       A copy of this report including all images has been saved and backed up to  a second source for retrieval if needed. All measures and details of the  anatomical scan, placentation, fluid volume and pelvic anatomy are  contained in that report.  Chari Manning 02/12/2013 3:47 PM  Clinical Impression and recommendations:  I have reviewed the sonogram results above, combined with the patient's  current clinical course, below are my impressions and any appropriate  recommendations for management based on the sonographic findings.  1.  [id number] Estimated Date of Delivery: 03/09/13 Twins variable  presentation, currently breech /vertex 2.  Normal fetal sonographic findings, minimal discordance 3.  Normal general sonographic findings  Recommend continued twice weekly assessments, will deliver at 38 weeks if  undelivered  Homar Weinkauf [name] 02/12/2013 4:35 PM       US Ob Follow Up  02/12/2013   TWINS FOLLOW UP SONOGRAM   [place] is in the office for a follow up sonogram of a twin  gestation.  She is a 28 y.o. year old G44P0101  with Estimated Date of Delivery: 03/09/13  now at  [redacted]w[redacted]d weeks gestation. Thus far the pregnancy has been otherwise  complicated by [name]/o PTd and elevated LFT's.   The twins are dichorionic/diamniotic.   TWIN A GESTATION:  PRESENTATION: breech FRANK  FETAL ACTIVITY:          Heart rate         137 bpm          The fetus is active.  AMNIOTIC FLUID: The amniotic fluid volume is  normal, single deepest pocket=2.4cm  PLACENTA LOCALIZATION:   GRADE 2  CERVIX: Unable to visualize  ADNEXA: The adnexa appears normal.   GESTATIONAL AGE AND  BIOMETRICS:  Gestational criteria: Estimated Date of Delivery: 03/09/13  now at [redacted]w[redacted]d  Previous  Scans:10              BIPARIETAL DIAMETER           8.77 cm         35+3 weeks  HEAD CIRCUMFERENCE           30.88 cm         34+3 weeks  ABDOMINAL CIRCUMFERENCE           30.69 cm         34+4 weeks  FEMUR LENGTH           6.65 cm         34+2 weeks                                                           AVERAGE EGA(BY THIS SCAN):   34+5 weeks                                                 ESTIMATED FETAL WEIGHT:        2495  grams, 18th %     ANATOMICAL SURVEY                                                                             COMMENTS CEREBRAL VENTRICLES yes normal   CHOROID PLEXUS yes normal   CEREBELLUM yes normal   CISTERNA MAGNA yes normal   NUCHAL REGION yes normal   ORBITS yes normal   NASAL BONE yes normal   NOSE/LIP yes normal   FACIAL PROFILE yes normal   4 CHAMBERED HEART yes normal   OUTFLOW TRACTS     DIAPHRAGM yes normal   STOMACH yes normal   RENAL REGION     BLADDER yes normal   CORD INSERTION     3 VESSEL CORD yes normal   SPINE     ARMS/HANDS     LEGS/FEET     GENITALIA   female        BIOPHYSCIAL PROFILE:                                                                                                       COMMENTS GROSS BODY MOVEMENT                 2   TONE                2   RESPIRATIONS  2   AMNIOTIC FLUID                2                                                             SCORE:  8/8 (Note: NST was not performed as part of this antepartum testing)   DOPPLER FLOW STUDIES: UMBILICAL ARTERY RI RATIOS:   0.60, 0.53    SUSPECTED ABNORMALITIES:  no  QUALITY OF SCAN: Satisfactory    TWIN B  GESTATION:   PRESENTATION: cephalic  FETAL ACTIVITY:          Heart rate         151 bpm          The fetus is active.  AMNIOTIC FLUID: The amniotic fluid volume is  normal, single deepest pocket=2.2 cm.  PLACENTA LOCALIZATION:  anterior GRADE 2  GESTATIONAL AGE AND  BIOMETRICS:  Gestational criteria: Estimated Date of Delivery: 03/09/13  now at [redacted]w[redacted]d  Previous Scans:10              BIPARIETAL DIAMETER           8.68 cm         35+0 weeks  HEAD CIRCUMFERENCE           30.65 cm         34+1 weeks  ABDOMINAL CIRCUMFERENCE           29.16 cm         33+1 weeks  FEMUR LENGTH           6.93 cm         35+4 weeks                                                           AVERAGE EGA(BY THIS SCAN):   34+3 weeks                                                 ESTIMATED FETAL WEIGHT:        2391  grams, 13th %     ANATOMICAL SURVEY                                                                             COMMENTS CEREBRAL VENTRICLES yes normal   CHOROID PLEXUS yes normal   CEREBELLUM yes normal   CISTERNA MAGNA yes normal   NUCHAL REGION yes normal   ORBITS     NASAL BONE     NOSE/LIP     FACIAL PROFILE     4 CHAMBERED HEART     OUTFLOW TRACTS     DIAPHRAGM yes  normal   STOMACH yes normal   RENAL REGION     BLADDER yes normal   CORD INSERTION     3 VESSEL CORD yes normal   SPINE     ARMS/HANDS     LEGS/FEET     GENITALIA   female        BIOPHYSCIAL PROFILE:                                                                                                       COMMENTS GROSS BODY MOVEMENT                 2   TONE                2   RESPIRATIONS                2   AMNIOTIC FLUID                2                                                            SCORE:  8/8 (Note: NST was not  performed as part of this antepartum testing)   DOPPLER FLOW STUDIES: UMBILICAL ARTERY RI RATIOS:   0.61, 0.56    SUSPECTED ABNORMALITIES:  no  QUALITY OF SCAN: Satisfactory    The current discrepancy in  fetal weight between the twins is 4% which  is  not clinically relevant.   TECHNICIAN COMMENTS:  U/S(36+3wks)-twin IUP DI/DI, + membrane noted, EFW discordance - 4% Baby A-maternal Rt side- BREECH (FRANK), FHR-137 bpm, EFW 5 lb 8 oz  (18th%tile), fluid wnl single deepest pocket-2.4cm, UA Doppler RI- 0.60 &  0.53, female fetus "Sophia", BPP 8/8 Baby B-maternal Lt side, VTX, FHR-151 bpm, EFW 5 lb 4 oz (13th%tile),  fluid wnl single deepest pocket-2.2cm, UA Doppler RI -0.61 & 0.56, female  fetus "Mason", BPP 8/8       A copy of this report including all images has been saved and backed up to  a second source for retrieval if needed. All measures and details of the  anatomical scan, placentation, fluid volume and pelvic anatomy are  contained in that report.  Chari Manning 02/12/2013 3:47 PM  Clinical Impression and recommendations:  I have reviewed the sonogram results above, combined with the patient's  current clinical course, below are my impressions and any appropriate  recommendations for management based on the sonographic findings.  1.  [id number] Estimated Date of Delivery: 03/09/13 Twins variable  presentation, currently breech /vertex 2.  Normal fetal sonographic findings, minimal discordance 3.  Normal general sonographic findings  Recommend continued twice weekly assessments, will deliver at 38 weeks if  undelivered  Alice [name] 02/12/2013 4:35 PM       US Ob Follow Up  01/28/2013   TWINS FOLLOW UP  SONOGRAM   [place] is in the office for a follow up sonogram of a twin  gestation.  She is a 28 y.o. year old G1P0101 with Estimated Date of Delivery: 03/09/13  by LMP now at  [redacted]w[redacted]d weeks gestation. Thus far the pregnancy has been  otherwise complicated by twins .   The twins are  dichorionic/diamniotic.   TWIN A GESTATION:  PRESENTATION: breech frank  FETAL ACTIVITY:          Heart rate         165 bpm          The fetus is active.  AMNIOTIC FLUID: The amniotic fluid volume is  normal, 6.1 cm single deepest pocket  PLACENTA LOCALIZATION:  anterior GRADE 2  CERVIX: Unable to visualize  ADNEXA: The adnexa appears normal.   GESTATIONAL AGE AND  BIOMETRICS:  Gestational criteria: Estimated Date of Delivery: 03/09/13 by LMP now at  [redacted]w[redacted]d  Previous Scans:              BIPARIETAL DIAMETER           8.52 cm         34+2 weeks  HEAD CIRCUMFERENCE           30.1 cm         33+3 weeks  ABDOMINAL CIRCUMFERENCE           27.24 cm         31+2 weeks  FEMUR LENGTH           6.49 cm         33+3 weeks                                                           AVERAGE EGA(BY THIS SCAN):   33+1 weeks                                                 ESTIMATED FETAL WEIGHT:        1964  grams, 17th %     ANATOMICAL SURVEY                                                                             COMMENTS CEREBRAL VENTRICLES yes normal   CHOROID PLEXUS yes normal   CEREBELLUM no    CISTERNA MAGNA yes normal   NUCHAL REGION yes normal   ORBITS yes normal   NASAL BONE no    NOSE/LIP no    FACIAL PROFILE no    4 CHAMBERED HEART no    OUTFLOW TRACTS no    DIAPHRAGM yes normal   STOMACH yes normal   RENAL REGION     BLADDER     CORD INSERTION     3 VESSEL CORD yes normal   SPINE     ARMS/HANDS     LEGS/FEET  GENITALIA   female        SUSPECTED ABNORMALITIES:  no  QUALITY OF SCAN: Satisfactory  BIOPHYSCIAL PROFILE:                                                                                                       COMMENTS GROSS BODY MOVEMENT                 2   TONE                2   RESPIRATIONS                2   AMNIOTIC FLUID                2                                                            SCORE:  8/8 (Note: NST was not performed as part of this antepartum testing)   DOPPLER FLOW STUDIES: UMBILICAL  ARTERY RI RATIOS:   0.53    TWIN B  GESTATION:  PRESENTATION: breech frank  FETAL ACTIVITY:          Heart rate         149 bpm          The fetus is active.  AMNIOTIC FLUID: The amniotic fluid volume is  normal, 2.7 cm single deepest pocket.  PLACENTA LOCALIZATION:  posterior GRADE 2  GESTATIONAL AGE AND  BIOMETRICS:  Gestational criteria: Estimated Date of Delivery: 03/09/13 by LMP now at  [redacted]w[redacted]d  Previous Scans:              BIPARIETAL DIAMETER           7.88 cm         31+4 weeks(lags >3wks)  HEAD CIRCUMFERENCE           30.03 cm         33+2 weeks  ABDOMINAL CIRCUMFERENCE           26.39 cm         30+4 weeks (lagas  >3wks)  FEMUR LENGTH           6.61 cm         34+0 weeks                                                           AVERAGE EGA(BY THIS SCAN):   32+3 weeks  ESTIMATED FETAL WEIGHT:        1910  grams, 14th %     ANATOMICAL SURVEY                                                                             COMMENTS CEREBRAL VENTRICLES yes normal   CHOROID PLEXUS no normal   CEREBELLUM yes normal   CISTERNA MAGNA yes normal   NUCHAL REGION yes normal   ORBITS yes normal   NASAL BONE     NOSE/LIP     FACIAL PROFILE     4 CHAMBERED HEART     OUTFLOW TRACTS     DIAPHRAGM yes normal   STOMACH yes normal   RENAL REGION     BLADDER     CORD INSERTION     3 VESSEL CORD yes normal   SPINE     ARMS/HANDS     LEGS/FEET     GENITALIA   female        SUSPECTED ABNORMALITIES:  no  QUALITY OF SCAN: Satisfactory   BIOPHYSCIAL PROFILE:                                                                                                       COMMENTS GROSS BODY MOVEMENT                 2   TONE                2   RESPIRATIONS                2   AMNIOTIC FLUID                2                                                            SCORE:  8/8 (Note: NST was not performed as part of this antepartum testing)   DOPPLER FLOW STUDIES: UMBILICAL ARTERY RI RATIOS:   0.56   The current  discrepancy in  fetal weight between the twins is 3% which  is  not clinically relevant.   TECHNICIAN COMMENTS:  U/S(34+2wks)-twin IUP, Baby A on maternal Rt side, Baby B on maternal Lt  side, EFW discordance=3% Baby A- breech, active fetus BPP 8/8, fluid wnl single deepest  pocket=6.1cm, female fetus "Sophia", UA Doppler RI-0.53, FHR- 165 bpm, EFW  17Th%tile (4 lb 5 oz),  Baby B-breech, active fetus, BPP 8/8, fluid wnl single deepest  pocket=2.7cm, female fetus "Mason", UA Doppler RI-0.56, FHR-149 bpm, EFW  14th%tile (4 lb  3 oz)  Baby A and Baby B EFW % has decreased since previous u/s     A copy of this report including all images has been saved and backed up to  a second source for retrieval if needed. All measures and details of the  anatomical scan, placentation, fluid volume and pelvic anatomy are  contained in that report.  Chari Manning 01/28/2013 11:28 AM    US Ob Follow Up  01/28/2013   TWINS FOLLOW UP SONOGRAM   Alice [name] is in the office for a follow up sonogram of a twin  gestation.  She is a 28 y.o. year old [id number] with Estimated Date of Delivery: 03/09/13  by LMP now at  [redacted]w[redacted]d weeks gestation. Thus far the pregnancy has been  otherwise complicated by twins .   The twins are dichorionic/diamniotic.   TWIN A GESTATION:  PRESENTATION: breech frank  FETAL ACTIVITY:          Heart rate         165 bpm          The fetus is active.  AMNIOTIC FLUID: The amniotic fluid volume is  normal, 6.1 cm single deepest pocket  PLACENTA LOCALIZATION:  anterior GRADE 2  CERVIX: Unable to visualize  ADNEXA: The adnexa appears normal.   GESTATIONAL AGE AND  BIOMETRICS:  Gestational criteria: Estimated Date of Delivery: 03/09/13 by LMP now at  [redacted]w[redacted]d  Previous Scans:              BIPARIETAL DIAMETER           8.52 cm         34+2 weeks  HEAD CIRCUMFERENCE           30.1 cm         33+3 weeks  ABDOMINAL CIRCUMFERENCE           27.24 cm         31+2 weeks  FEMUR LENGTH           6.49 cm         33+3 weeks                                                            AVERAGE EGA(BY THIS SCAN):   33+1 weeks                                                 ESTIMATED FETAL WEIGHT:        1964  grams, 17th %     ANATOMICAL SURVEY                                                                             COMMENTS CEREBRAL VENTRICLES yes normal   CHOROID PLEXUS yes normal   CEREBELLUM no    CISTERNA MAGNA yes normal   NUCHAL REGION yes normal  ORBITS yes normal   NASAL BONE no    NOSE/LIP no    FACIAL PROFILE no    4 CHAMBERED HEART no    OUTFLOW TRACTS no    DIAPHRAGM yes normal   STOMACH yes normal   RENAL REGION     BLADDER     CORD INSERTION     3 VESSEL CORD yes normal   SPINE     ARMS/HANDS     LEGS/FEET     GENITALIA   female        SUSPECTED ABNORMALITIES:  no  QUALITY OF SCAN: Satisfactory  BIOPHYSCIAL PROFILE:                                                                                                       COMMENTS GROSS BODY MOVEMENT                 2   TONE                2   RESPIRATIONS                2   AMNIOTIC FLUID                2                                                            SCORE:  8/8 (Note: NST was not performed as part of this antepartum testing)   DOPPLER FLOW STUDIES: UMBILICAL ARTERY RI RATIOS:   0.53    TWIN B  GESTATION:  PRESENTATION: breech frank  FETAL ACTIVITY:          Heart rate         149 bpm          The fetus is active.  AMNIOTIC FLUID: The amniotic fluid volume is  normal, 2.7 cm single deepest pocket.  PLACENTA LOCALIZATION:  posterior GRADE 2  GESTATIONAL AGE AND  BIOMETRICS:  Gestational criteria: Estimated Date of Delivery: 03/09/13 by LMP now at  [redacted]w[redacted]d  Previous Scans:              BIPARIETAL DIAMETER           7.88 cm         31+4 weeks(lags >3wks)  HEAD CIRCUMFERENCE           30.03 cm         33+2 weeks  ABDOMINAL CIRCUMFERENCE           26.39 cm         30+4 weeks (lagas  >3wks)  FEMUR LENGTH           6.61 cm         34+0 weeks  AVERAGE EGA(BY THIS SCAN):   32+3 weeks                                                 ESTIMATED FETAL WEIGHT:        1910  grams, 14th %     ANATOMICAL SURVEY                                                                             COMMENTS CEREBRAL VENTRICLES yes normal   CHOROID PLEXUS no normal   CEREBELLUM yes normal   CISTERNA MAGNA yes normal   NUCHAL REGION yes normal   ORBITS yes normal   NASAL BONE     NOSE/LIP     FACIAL PROFILE     4 CHAMBERED HEART     OUTFLOW TRACTS     DIAPHRAGM yes normal   STOMACH yes normal   RENAL REGION     BLADDER     CORD INSERTION     3 VESSEL CORD yes normal   SPINE     ARMS/HANDS     LEGS/FEET     GENITALIA   female        SUSPECTED ABNORMALITIES:  no  QUALITY OF SCAN: Satisfactory   BIOPHYSCIAL PROFILE:                                                                                                       COMMENTS GROSS BODY MOVEMENT                 2   TONE                2   RESPIRATIONS                2   AMNIOTIC FLUID                2                                                            SCORE:  8/8 (Note: NST was not performed as part of this antepartum testing)   DOPPLER FLOW STUDIES: UMBILICAL ARTERY RI RATIOS:   0.56   The current discrepancy in  fetal weight between the twins is 3% which  is  not clinically relevant.   TECHNICIAN COMMENTS:  U/S(34+2wks)-twin IUP, Baby A on maternal Rt side, Baby B on maternal Lt  side, EFW discordance=3%  Baby A- breech, active fetus BPP 8/8, fluid wnl single deepest  pocket=6.1cm, female fetus "Sophia", UA Doppler RI-0.53, FHR- 165 bpm, EFW  17Th%tile (4 lb 5 oz),  Baby B-breech, active fetus, BPP 8/8, fluid wnl single deepest  pocket=2.7cm, female fetus "Mason", UA Doppler RI-0.56, FHR-149 bpm, EFW  14th%tile (4 lb 3 oz)  Baby A and Baby B EFW % has decreased since previous u/s     A copy of this report including all images has been saved and backed up to  a second source for retrieval if needed. All measures and  details of the  anatomical scan, placentation, fluid volume and pelvic anatomy are  contained in that report.  Chari Manning 01/28/2013 11:28 AM    US Fetal Bpp W/o Non Stress  02/18/2013   TWINS FOLLOW UP SONOGRAM   Alice [name] is in the office for a follow up sonogram of a twin  gestation.  She is a 28 y.o. year old [id number] with Estimated Date of Delivery: 03/09/13  now at  [redacted]w[redacted]d weeks gestation. Thus far the pregnancy has been otherwise  complicated by twin IUP and breech presentation.   The twins are dichorionic/diamniotic.   TWIN A GESTATION:   PRESENTATION: breech FRANK  FETAL ACTIVITY:          Heart rate         145 bpm          The fetus is active.  AMNIOTIC FLUID: The amniotic fluid volume is  normal, single deepest pocket-4.6 cm.  PLACENTA LOCALIZATION:  posterior GRADE 3  CERVIX: Unable to visualize  ADNEXA: The adnexa appears wnl   GESTATIONAL AGE AND  BIOMETRICS:  Gestational criteria: Estimated Date of Delivery: 03/09/13  now at [redacted]w[redacted]d  BIOPHYSCIAL PROFILE:                                                                                                       COMMENTS GROSS BODY MOVEMENT                 2   TONE                2   RESPIRATIONS                2   AMNIOTIC FLUID                2                                                            SCORE:  8/8 (Note: NST was not performed as part of this antepartum testing)   DOPPLER FLOW STUDIES: UMBILICAL ARTERY RI RATIOS:   0.55,    SUSPECTED ABNORMALITIES:  Breech presentation  QUALITY OF SCAN: Sub-optimal   TWIN B  GESTATION:   PRESENTATION: cephalic  FETAL ACTIVITY:  Heart rate         140 bpm          The fetus is active.  AMNIOTIC FLUID: The amniotic fluid volume is  normal, single deepest pocket-3.1 cm.  PLACENTA LOCALIZATION:  anterior GRADE 2   GESTATIONAL AGE AND  BIOMETRICS:  Gestational criteria: Estimated Date of Delivery: 03/09/13  now at [redacted]w[redacted]d  BIOPHYSCIAL PROFILE:                                                                                                        COMMENTS GROSS BODY MOVEMENT                 2   TONE                2   RESPIRATIONS                2   AMNIOTIC FLUID                2                                                            SCORE:  8/8 (Note: NST was not performed as part of this antepartum testing)   DOPPLER FLOW STUDIES: UMBILICAL ARTERY RI RATIOS:   0.60,   SUSPECTED ABNORMALITIES:  no  QUALITY OF SCAN: Sub-optimal   TECHNICIAN COMMENTS:  U/S(37+2wks)-twin IUP (Di/Di), +membrane noted, Baby A-(maternal Rt side) BREECH (frank), BPP 8/8, fluid wnl single  deepest pocket noted-4.6cm, posterior gr 3 placenta noted, female fetus  "Sophia", FHR-145 bpm, UA Doppler RI-0.55 Baby B-(maternal Lt side) VERTEX, BPP 8/8, fluid wnl single deepest pocket  noted-3.1cm, anterior Gr 2 placenta noted, female fetus "Mason", FHR-140  bpm, UA Doppler RI-0.60        A copy of this report including all images has been saved and backed up to  a second source for retrieval if needed. All measures and details of the  anatomical scan, placentation, fluid volume and pelvic anatomy are  contained in that report.  Chari Manning 02/18/2013 3:14 PM    US Fetal Bpp W/o Non Stress  02/18/2013   TWINS FOLLOW UP SONOGRAM   Alice [name] is in the office for a follow up sonogram of a twin  gestation.  She is a 28 y.o. year old [id number] with Estimated Date of Delivery: 03/09/13  now at  [redacted]w[redacted]d weeks gestation. Thus far the pregnancy has been otherwise  complicated by twin IUP and breech presentation.   The twins are dichorionic/diamniotic.   TWIN A GESTATION:   PRESENTATION: breech FRANK  FETAL ACTIVITY:          Heart rate         145 bpm          The fetus is active.  AMNIOTIC FLUID: The  amniotic fluid volume is  normal, single deepest pocket-4.6 cm.  PLACENTA LOCALIZATION:  posterior GRADE 3  CERVIX: Unable to visualize  ADNEXA: The adnexa appears wnl   GESTATIONAL AGE AND  BIOMETRICS:  Gestational criteria: Estimated Date of  Delivery: 03/09/13  now at 6912w2d  BIOPHYSCIAL PROFILE:                                                                                                       COMMENTS GROSS BODY MOVEMENT                 2   TONE                2   RESPIRATIONS                2   AMNIOTIC FLUID                2                                                            SCORE:  8/8 (Note: NST was not performed as part of this antepartum testing)   DOPPLER FLOW STUDIES: UMBILICAL ARTERY RI RATIOS:   0.55,    SUSPECTED ABNORMALITIES:  Breech presentation  QUALITY OF SCAN: Sub-optimal   TWIN B  GESTATION:   PRESENTATION: cephalic  FETAL ACTIVITY:          Heart rate         140 bpm          The fetus is active.  AMNIOTIC FLUID: The amniotic fluid volume is  normal, single deepest pocket-3.1 cm.  PLACENTA LOCALIZATION:  anterior GRADE 2   GESTATIONAL AGE AND  BIOMETRICS:  Gestational criteria: Estimated Date of Delivery: 03/09/13  now at 6112w2d  BIOPHYSCIAL PROFILE:                                                                                                       COMMENTS GROSS BODY MOVEMENT                 2   TONE                2   RESPIRATIONS                2   AMNIOTIC FLUID  2                                                            SCORE:  8/8 (Note: NST was not performed as part of this antepartum testing)   DOPPLER FLOW STUDIES: UMBILICAL ARTERY RI RATIOS:   0.60,   SUSPECTED ABNORMALITIES:  no  QUALITY OF SCAN: Sub-optimal   TECHNICIAN COMMENTS:  U/S(37+2wks)-twin IUP (Di/Di), +membrane noted, Baby A-(maternal Rt side) BREECH (frank), BPP 8/8, fluid wnl single  deepest pocket noted-4.6cm, posterior gr 3 placenta noted, female fetus  "Sophia", FHR-145 bpm, UA Doppler RI-0.55 Baby B-(maternal Lt side) VERTEX, BPP 8/8, fluid wnl single deepest pocket  noted-3.1cm, anterior Gr 2 placenta noted, female fetus "Mason", FHR-140  bpm, UA Doppler RI-0.60        A copy of this report including all images has been saved and  backed up to  a second source for retrieval if needed. All measures and details of the  anatomical scan, placentation, fluid volume and pelvic anatomy are  contained in that report.  Chari Manning 02/18/2013 3:14 PM    US Fetal Bpp W/o Non Stress  02/12/2013   TWINS FOLLOW UP SONOGRAM   Alice [name] is in the office for a follow up sonogram of a twin  gestation.  She is a 28 y.o. year old [id number] with Estimated Date of Delivery: 03/09/13  now at  [redacted]w[redacted]d weeks gestation. Thus far the pregnancy has been otherwise  complicated by [name]/o PTd and elevated LFT's.   The twins are dichorionic/diamniotic.   TWIN A GESTATION:  PRESENTATION: breech FRANK  FETAL ACTIVITY:          Heart rate         137 bpm          The fetus is active.  AMNIOTIC FLUID: The amniotic fluid volume is  normal, single deepest pocket=2.4cm  PLACENTA LOCALIZATION:   GRADE 2  CERVIX: Unable to visualize  ADNEXA: The adnexa appears normal.   GESTATIONAL AGE AND  BIOMETRICS:  Gestational criteria: Estimated Date of Delivery: 03/09/13  now at [redacted]w[redacted]d  Previous Scans:10              BIPARIETAL DIAMETER           8.77 cm         35+3 weeks  HEAD CIRCUMFERENCE           30.88 cm         34+3 weeks  ABDOMINAL CIRCUMFERENCE           30.69 cm         34+4 weeks  FEMUR LENGTH           6.65 cm         34+2 weeks                                                           AVERAGE EGA(BY THIS SCAN):   34+5 weeks  ESTIMATED FETAL WEIGHT:        2495  grams, 18th %     ANATOMICAL SURVEY                                                                             COMMENTS CEREBRAL VENTRICLES yes normal   CHOROID PLEXUS yes normal   CEREBELLUM yes normal   CISTERNA MAGNA yes normal   NUCHAL REGION yes normal   ORBITS yes normal   NASAL BONE yes normal   NOSE/LIP yes normal   FACIAL PROFILE yes normal   4 CHAMBERED HEART yes normal   OUTFLOW TRACTS     DIAPHRAGM yes normal   STOMACH yes normal   RENAL REGION     BLADDER  yes normal   CORD INSERTION     3 VESSEL CORD yes normal   SPINE     ARMS/HANDS     LEGS/FEET     GENITALIA   female        BIOPHYSCIAL PROFILE:                                                                                                       COMMENTS GROSS BODY MOVEMENT                 2   TONE                2   RESPIRATIONS                2   AMNIOTIC FLUID                2                                                            SCORE:  8/8 (Note: NST was not performed as part of this antepartum testing)   DOPPLER FLOW STUDIES: UMBILICAL ARTERY RI RATIOS:   0.60, 0.53    SUSPECTED ABNORMALITIES:  no  QUALITY OF SCAN: Satisfactory    TWIN B  GESTATION:   PRESENTATION: cephalic  FETAL ACTIVITY:          Heart rate         151 bpm          The fetus is active.  AMNIOTIC FLUID: The amniotic fluid volume is  normal, single deepest pocket=2.2 cm.  PLACENTA LOCALIZATION:  anterior GRADE 2  GESTATIONAL AGE AND  BIOMETRICS:  Gestational criteria: Estimated Date of Delivery: 03/09/13  now at [redacted]w[redacted]d  Previous Scans:10  BIPARIETAL DIAMETER           8.68 cm         35+0 weeks  HEAD CIRCUMFERENCE           30.65 cm         34+1 weeks  ABDOMINAL CIRCUMFERENCE           29.16 cm         33+1 weeks  FEMUR LENGTH           6.93 cm         35+4 weeks                                                           AVERAGE EGA(BY THIS SCAN):   34+3 weeks                                                 ESTIMATED FETAL WEIGHT:        2391  grams, 13th %     ANATOMICAL SURVEY                                                                             COMMENTS CEREBRAL VENTRICLES yes normal   CHOROID PLEXUS yes normal   CEREBELLUM yes normal   CISTERNA MAGNA yes normal   NUCHAL REGION yes normal   ORBITS     NASAL BONE     NOSE/LIP     FACIAL PROFILE     4 CHAMBERED HEART     OUTFLOW TRACTS     DIAPHRAGM yes normal   STOMACH yes normal   RENAL REGION     BLADDER yes normal   CORD INSERTION     3 VESSEL CORD yes normal   SPINE      ARMS/HANDS     LEGS/FEET     GENITALIA   female        BIOPHYSCIAL PROFILE:                                                                                                       COMMENTS GROSS BODY MOVEMENT                 2   TONE                2   RESPIRATIONS                2  AMNIOTIC FLUID                2                                                            SCORE:  8/8 (Note: NST was not performed as part of this antepartum testing)   DOPPLER FLOW STUDIES: UMBILICAL ARTERY RI RATIOS:   0.61, 0.56    SUSPECTED ABNORMALITIES:  no  QUALITY OF SCAN: Satisfactory    The current discrepancy in  fetal weight between the twins is 4% which  is  not clinically relevant.   TECHNICIAN COMMENTS:  U/S(36+3wks)-twin IUP DI/DI, + membrane noted, EFW discordance - 4% Baby A-maternal Rt side- BREECH (FRANK), FHR-137 bpm, EFW 5 lb 8 oz  (18th%tile), fluid wnl single deepest pocket-2.4cm, UA Doppler RI- 0.60 &  0.53, female fetus "Sophia", BPP 8/8 Baby B-maternal Lt side, VTX, FHR-151 bpm, EFW 5 lb 4 oz (13th%tile),  fluid wnl single deepest pocket-2.2cm, UA Doppler RI -0.61 & 0.56, female  fetus "Mason", BPP 8/8       A copy of this report including all images has been saved and backed up to  a second source for retrieval if needed. All measures and details of the  anatomical scan, placentation, fluid volume and pelvic anatomy are  contained in that report.  Chari Manning 02/12/2013 3:47 PM  Clinical Impression and recommendations:  I have reviewed the sonogram results above, combined with the patient's  current clinical course, below are my impressions and any appropriate  recommendations for management based on the sonographic findings.  1.  [id number] Estimated Date of Delivery: 03/09/13 Twins variable  presentation, currently breech /vertex 2.  Normal fetal sonographic findings, minimal discordance 3.  Normal general sonographic findings  Recommend continued twice weekly assessments, will deliver at 38 weeks if   undelivered  Alice [name] 02/12/2013 4:35 PM       US Fetal Bpp W/o Non Stress  02/12/2013   TWINS FOLLOW UP SONOGRAM   [place] is in the office for a follow up sonogram of a twin  gestation.  She is a 28 y.o. year old [id number] with Estimated Date of Delivery: 03/09/13  now at  [redacted]w[redacted]d weeks gestation. Thus far the pregnancy has been otherwise  complicated by [name]/o PTd and elevated LFT's.   The twins are dichorionic/diamniotic.   TWIN A GESTATION:  PRESENTATION: breech FRANK  FETAL ACTIVITY:          Heart rate         137 bpm          The fetus is active.  AMNIOTIC FLUID: The amniotic fluid volume is  normal, single deepest pocket=2.4cm  PLACENTA LOCALIZATION:   GRADE 2  CERVIX: Unable to visualize  ADNEXA: The adnexa appears normal.   GESTATIONAL AGE AND  BIOMETRICS:  Gestational criteria: Estimated Date of Delivery: 03/09/13  now at [redacted]w[redacted]d  Previous Scans:10              BIPARIETAL DIAMETER           8.77 cm         35+3 weeks  HEAD CIRCUMFERENCE           30.88 cm  34+3 weeks  ABDOMINAL CIRCUMFERENCE           30.69 cm         34+4 weeks  FEMUR LENGTH           6.65 cm         34+2 weeks                                                           AVERAGE EGA(BY THIS SCAN):   34+5 weeks                                                 ESTIMATED FETAL WEIGHT:        2495  grams, 18th %     ANATOMICAL SURVEY                                                                             COMMENTS CEREBRAL VENTRICLES yes normal   CHOROID PLEXUS yes normal   CEREBELLUM yes normal   CISTERNA MAGNA yes normal   NUCHAL REGION yes normal   ORBITS yes normal   NASAL BONE yes normal   NOSE/LIP yes normal   FACIAL PROFILE yes normal   4 CHAMBERED HEART yes normal   OUTFLOW TRACTS     DIAPHRAGM yes normal   STOMACH yes normal   RENAL REGION     BLADDER yes normal   CORD INSERTION     3 VESSEL CORD yes normal   SPINE     ARMS/HANDS     LEGS/FEET     GENITALIA   female        BIOPHYSCIAL PROFILE:                                                                                                        COMMENTS GROSS BODY MOVEMENT                 2   TONE                2   RESPIRATIONS                2   AMNIOTIC FLUID                2  SCORE:  8/8 (Note: NST was not performed as part of this antepartum testing)   DOPPLER FLOW STUDIES: UMBILICAL ARTERY RI RATIOS:   0.60, 0.53    SUSPECTED ABNORMALITIES:  no  QUALITY OF SCAN: Satisfactory    TWIN B  GESTATION:   PRESENTATION: cephalic  FETAL ACTIVITY:          Heart rate         151 bpm          The fetus is active.  AMNIOTIC FLUID: The amniotic fluid volume is  normal, single deepest pocket=2.2 cm.  PLACENTA LOCALIZATION:  anterior GRADE 2  GESTATIONAL AGE AND  BIOMETRICS:  Gestational criteria: Estimated Date of Delivery: 03/09/13  now at [redacted]w[redacted]d  Previous Scans:10              BIPARIETAL DIAMETER           8.68 cm         35+0 weeks  HEAD CIRCUMFERENCE           30.65 cm         34+1 weeks  ABDOMINAL CIRCUMFERENCE           29.16 cm         33+1 weeks  FEMUR LENGTH           6.93 cm         35+4 weeks                                                           AVERAGE EGA(BY THIS SCAN):   34+3 weeks                                                 ESTIMATED FETAL WEIGHT:        2391  grams, 13th %     ANATOMICAL SURVEY                                                                             COMMENTS CEREBRAL VENTRICLES yes normal   CHOROID PLEXUS yes normal   CEREBELLUM yes normal   CISTERNA MAGNA yes normal   NUCHAL REGION yes normal   ORBITS     NASAL BONE     NOSE/LIP     FACIAL PROFILE     4 CHAMBERED HEART     OUTFLOW TRACTS     DIAPHRAGM yes normal   STOMACH yes normal   RENAL REGION     BLADDER yes normal   CORD INSERTION     3 VESSEL CORD yes normal   SPINE     ARMS/HANDS     LEGS/FEET     GENITALIA   female        BIOPHYSCIAL PROFILE:  COMMENTS GROSS BODY MOVEMENT                 2   TONE                2   RESPIRATIONS                2   AMNIOTIC FLUID                2                                                            SCORE:  8/8 (Note: NST was not performed as part of this antepartum testing)   DOPPLER FLOW STUDIES: UMBILICAL ARTERY RI RATIOS:   0.61, 0.56    SUSPECTED ABNORMALITIES:  no  QUALITY OF SCAN: Satisfactory    The current discrepancy in  fetal weight between the twins is 4% which  is  not clinically relevant.   TECHNICIAN COMMENTS:  U/S(36+3wks)-twin IUP DI/DI, + membrane noted, EFW discordance - 4% Baby A-maternal Rt side- BREECH (FRANK), FHR-137 bpm, EFW 5 lb 8 oz  (18th%tile), fluid wnl single deepest pocket-2.4cm, UA Doppler RI- 0.60 &  0.53, female fetus "Sophia", BPP 8/8 Baby B-maternal Lt side, VTX, FHR-151 bpm, EFW 5 lb 4 oz (13th%tile),  fluid wnl single deepest pocket-2.2cm, UA Doppler RI -0.61 & 0.56, female  fetus "Mason", BPP 8/8       A copy of this report including all images has been saved and backed up to  a second source for retrieval if needed. All measures and details of the  anatomical scan, placentation, fluid volume and pelvic anatomy are  contained in that report.  Chari Manning 02/12/2013 3:47 PM  Clinical Impression and recommendations:  I have reviewed the sonogram results above, combined with the patient's  current clinical course, below are my impressions and any appropriate  recommendations for management based on the sonographic findings.  1.  [id number] Estimated Date of Delivery: 03/09/13 Twins variable  presentation, currently breech /vertex 2.  Normal fetal sonographic findings, minimal discordance 3.  Normal general sonographic findings  Recommend continued twice weekly assessments, will deliver at 38 weeks if  undelivered  Alice [name] 02/12/2013 4:35 PM       US Fetal Bpp W/o Non Stress     Addendum: The twins are both vertex, not breech as documented below.  Please note  this  discrepancy  Alice [name] [name] 02/04/2013 2:47 PM  02/04/2013   TWINS FOLLOW UP SONOGRAM   Alice [name] is in the office for a follow up sonogram of a twin  gestation.  She is a 28 y.o. year old [id number] with Estimated Date of Delivery: 03/09/13   now at  [redacted]w[redacted]d weeks gestation. Thus far the pregnancy has been otherwise  complicated by [name]/o PTD and morbid obesity.   The twins are dichorionic/diamniotic.   TWIN A GESTATION:   PRESENTATION: cephalic  FETAL ACTIVITY:          Heart rate         148 bpm          The fetus is active.  AMNIOTIC FLUID: The amniotic fluid volume is  normal, 4.9 cm(single deepest pocket)  PLACENTA LOCALIZATION:  anterior GRADE  2  CERVIX: Unable to visualize  ADNEXA: The adnexa appears normal.   GESTATIONAL AGE AND  BIOMETRICS:  Gestational criteria: Estimated Date of Delivery: 03/09/13  now at [redacted]w[redacted]d  BIOPHYSCIAL PROFILE:                                                                                                       COMMENTS GROSS BODY MOVEMENT                 2   TONE                2   RESPIRATIONS                2   AMNIOTIC FLUID                2                                                            SCORE:  8/8 (Note: NST was not performed as part of this antepartum testing)   DOPPLER FLOW STUDIES: UMBILICAL ARTERY RI RATIOS:   0.56    SUSPECTED ABNORMALITIES:  no  QUALITY OF SCAN: Satisfactory    TWIN B  GESTATION:   PRESENTATION: cephalic  FETAL ACTIVITY:          Heart rate         167 bpm          The fetus is active.  AMNIOTIC FLUID: The amniotic fluid volume is  normal, 3.3 cm(single deepest pocket).  PLACENTA LOCALIZATION:  posterior  GRADE 2  GESTATIONAL AGE AND  BIOMETRICS:  Gestational criteria: Estimated Date of Delivery: 03/09/13 now at [redacted]w[redacted]d    BIOPHYSCIAL PROFILE:                                                                                                       COMMENTS GROSS BODY MOVEMENT                 2   TONE                2   RESPIRATIONS                2    AMNIOTIC FLUID                2  SCORE:  8/8 (Note: NST was not performed as part of this antepartum testing)   DOPPLER FLOW STUDIES: UMBILICAL ARTERY RI RATIOS:   0.62    SUSPECTED ABNORMALITIES:  no  QUALITY OF SCAN: Satisfactory   TECHNICIAN COMMENTS:  U/S(35+2wks)-Twin IUP Di/Di,  Baby A-vtx active fetus, BPP 8/8, fluid WNL AFI-4.9cm(single deepest  pocket), female fetus "Sophia" UA Doppler RI-0.56, FHR-148bpm Baby B-vtx active fetus, BPP 8/8, fluid WNL AFI-3.3cm (single deepest  pocket),  Female fetus "Mason, UA Doppler RI-0.62, FHR-167 bpm    A copy of this report including all images has been saved and backed up to  a second source for retrieval if needed. All measures and details of the  anatomical scan, placentation, fluid volume and pelvic anatomy are  contained in that report.  Chari Manning 02/04/2013 1:54 PM  Clinical Impression and recommendations:  I have reviewed the sonogram results above, combined with the patient's  current clinical course, below are my impressions and any appropriate  recommendations for management based on the sonographic findings.  1.  [id number] Estimated Date of Delivery: 03/09/13 by  early ultrasound and  confirmed by today's sonographic dating 2.  Normal fetal sonographic findings, specifically reassuring fetal  assessment with good fluid; unfortunately breech, breech presentation  3.  Normal general sonographic findings  Recommend routine prenatal care based on this sonogram or as clinically  indicated  Alice [name] 02/04/2013 2:32 PM       US Fetal Bpp W/o Non Stress     Addendum: The twins are both vertex, not breech as documented below.  Please note  this discrepancy  Alice Bussie [name] 02/04/2013 2:47 PM  02/04/2013   TWINS FOLLOW UP SONOGRAM   [place] is in the office for a follow up sonogram of a twin  gestation.  She is a 28 y.o. year old [id number] with Estimated Date of Delivery: 03/09/13   now at   [redacted]w[redacted]d weeks gestation. Thus far the pregnancy has been otherwise  complicated by [name]/o PTD and morbid obesity.   The twins are dichorionic/diamniotic.   TWIN A GESTATION:   PRESENTATION: cephalic  FETAL ACTIVITY:          Heart rate         148 bpm          The fetus is active.  AMNIOTIC FLUID: The amniotic fluid volume is  normal, 4.9 cm(single deepest pocket)  PLACENTA LOCALIZATION:  anterior GRADE  2  CERVIX: Unable to visualize  ADNEXA: The adnexa appears normal.   GESTATIONAL AGE AND  BIOMETRICS:  Gestational criteria: Estimated Date of Delivery: 03/09/13  now at [redacted]w[redacted]d  BIOPHYSCIAL PROFILE:                                                                                                       COMMENTS GROSS BODY MOVEMENT                 2   TONE                2   RESPIRATIONS  2   AMNIOTIC FLUID                2                                                            SCORE:  8/8 (Note: NST was not performed as part of this antepartum testing)   DOPPLER FLOW STUDIES: UMBILICAL ARTERY RI RATIOS:   0.56    SUSPECTED ABNORMALITIES:  no  QUALITY OF SCAN: Satisfactory    TWIN B  GESTATION:   PRESENTATION: cephalic  FETAL ACTIVITY:          Heart rate         167 bpm          The fetus is active.  AMNIOTIC FLUID: The amniotic fluid volume is  normal, 3.3 cm(single deepest pocket).  PLACENTA LOCALIZATION:  posterior  GRADE 2  GESTATIONAL AGE AND  BIOMETRICS:  Gestational criteria: Estimated Date of Delivery: 03/09/13 now at [redacted]w[redacted]d    BIOPHYSCIAL PROFILE:                                                                                                       COMMENTS GROSS BODY MOVEMENT                 2   TONE                2   RESPIRATIONS                2   AMNIOTIC FLUID                2                                                            SCORE:  8/8 (Note: NST was not performed as part of this antepartum testing)   DOPPLER FLOW STUDIES: UMBILICAL ARTERY RI RATIOS:   0.62    SUSPECTED ABNORMALITIES:   no  QUALITY OF SCAN: Satisfactory   TECHNICIAN COMMENTS:  U/S(35+2wks)-Twin IUP Di/Di,  Baby A-vtx active fetus, BPP 8/8, fluid WNL AFI-4.9cm(single deepest  pocket), female fetus "Sophia" UA Doppler RI-0.56, FHR-148bpm Baby B-vtx active fetus, BPP 8/8, fluid WNL AFI-3.3cm (single deepest  pocket),  Female fetus "Mason, UA Doppler RI-0.62, FHR-167 bpm    A copy of this report including all images has been saved and backed up to  a second source for retrieval if needed. All measures and details of the  anatomical scan, placentation, fluid volume and pelvic anatomy are  contained in that report.  Chari Manning 02/04/2013 1:54 PM  Clinical Impression and recommendations:  I have  reviewed the sonogram results above, combined with the patient's  current clinical course, below are my impressions and any appropriate  recommendations for management based on the sonographic findings.  1.  [id number] Estimated Date of Delivery: 03/09/13 by  early ultrasound and  confirmed by today's sonographic dating 2.  Normal fetal sonographic findings, specifically reassuring fetal  assessment with good fluid; unfortunately breech, breech presentation  3.  Normal general sonographic findings  Recommend routine prenatal care based on this sonogram or as clinically  indicated  Shylin Keizer [name] 02/04/2013 2:32 PM       US Fetal Bpp W/o Non Stress  01/28/2013   TWINS FOLLOW UP SONOGRAM   [place] is in the office for a follow up sonogram of a twin  gestation.  She is a 28 y.o. year old [id number] with Estimated Date of Delivery: 03/09/13  by LMP now at  [redacted]w[redacted]d weeks gestation. Thus far the pregnancy has been  otherwise complicated by twins .   The twins are dichorionic/diamniotic.   TWIN A GESTATION:  PRESENTATION: breech frank  FETAL ACTIVITY:          Heart rate         165 bpm          The fetus is active.  AMNIOTIC FLUID: The amniotic fluid volume is  normal, 6.1 cm single deepest pocket  PLACENTA LOCALIZATION:  anterior GRADE 2  CERVIX:  Unable to visualize  ADNEXA: The adnexa appears normal.   GESTATIONAL AGE AND  BIOMETRICS:  Gestational criteria: Estimated Date of Delivery: 03/09/13 by LMP now at  [redacted]w[redacted]d  Previous Scans:              BIPARIETAL DIAMETER           8.52 cm         34+2 weeks  HEAD CIRCUMFERENCE           30.1 cm         33+3 weeks  ABDOMINAL CIRCUMFERENCE           27.24 cm         31+2 weeks  FEMUR LENGTH           6.49 cm         33+3 weeks                                                           AVERAGE EGA(BY THIS SCAN):   33+1 weeks                                                 ESTIMATED FETAL WEIGHT:        1964  grams, 17th %     ANATOMICAL SURVEY  COMMENTS CEREBRAL VENTRICLES yes normal   CHOROID PLEXUS yes normal   CEREBELLUM no    CISTERNA MAGNA yes normal   NUCHAL REGION yes normal   ORBITS yes normal   NASAL BONE no    NOSE/LIP no    FACIAL PROFILE no    4 CHAMBERED HEART no    OUTFLOW TRACTS no    DIAPHRAGM yes normal   STOMACH yes normal   RENAL REGION     BLADDER     CORD INSERTION     3 VESSEL CORD yes normal   SPINE     ARMS/HANDS     LEGS/FEET     GENITALIA   female        SUSPECTED ABNORMALITIES:  no  QUALITY OF SCAN: Satisfactory  BIOPHYSCIAL PROFILE:                                                                                                       COMMENTS GROSS BODY MOVEMENT                 2   TONE                2   RESPIRATIONS                2   AMNIOTIC FLUID                2                                                            SCORE:  8/8 (Note: NST was not performed as part of this antepartum testing)   DOPPLER FLOW STUDIES: UMBILICAL ARTERY RI RATIOS:   0.53    TWIN B  GESTATION:  PRESENTATION: breech frank  FETAL ACTIVITY:          Heart rate         149 bpm          The fetus is active.  AMNIOTIC FLUID: The amniotic fluid volume is  normal, 2.7 cm single deepest pocket.  PLACENTA LOCALIZATION:  posterior GRADE 2   GESTATIONAL AGE AND  BIOMETRICS:  Gestational criteria: Estimated Date of Delivery: 03/09/13 by LMP now at  [redacted]w[redacted]d  Previous Scans:              BIPARIETAL DIAMETER           7.88 cm         31+4 weeks(lags >3wks)  HEAD CIRCUMFERENCE           30.03 cm         33+2 weeks  ABDOMINAL CIRCUMFERENCE           26.39 cm         30+4 weeks (lagas  >3wks)  FEMUR LENGTH  6.61 cm         34+0 weeks                                                           AVERAGE EGA(BY THIS SCAN):   32+3 weeks                                                 ESTIMATED FETAL WEIGHT:        1910  grams, 14th %     ANATOMICAL SURVEY                                                                             COMMENTS CEREBRAL VENTRICLES yes normal   CHOROID PLEXUS no normal   CEREBELLUM yes normal   CISTERNA MAGNA yes normal   NUCHAL REGION yes normal   ORBITS yes normal   NASAL BONE     NOSE/LIP     FACIAL PROFILE     4 CHAMBERED HEART     OUTFLOW TRACTS     DIAPHRAGM yes normal   STOMACH yes normal   RENAL REGION     BLADDER     CORD INSERTION     3 VESSEL CORD yes normal   SPINE     ARMS/HANDS     LEGS/FEET     GENITALIA   female        SUSPECTED ABNORMALITIES:  no  QUALITY OF SCAN: Satisfactory   BIOPHYSCIAL PROFILE:                                                                                                       COMMENTS GROSS BODY MOVEMENT                 2   TONE                2   RESPIRATIONS                2   AMNIOTIC FLUID                2  SCORE:  8/8 (Note: NST was not performed as part of this antepartum testing)   DOPPLER FLOW STUDIES: UMBILICAL ARTERY RI RATIOS:   0.56   The current discrepancy in  fetal weight between the twins is 3% which  is  not clinically relevant.   TECHNICIAN COMMENTS:  U/S(34+2wks)-twin IUP, Baby A on maternal Rt side, Baby B on maternal Lt  side, EFW discordance=3% Baby A- breech, active fetus BPP 8/8, fluid wnl single deepest   pocket=6.1cm, female fetus "Sophia", UA Doppler RI-0.53, FHR- 165 bpm, EFW  17Th%tile (4 lb 5 oz),  Baby B-breech, active fetus, BPP 8/8, fluid wnl single deepest  pocket=2.7cm, female fetus "Mason", UA Doppler RI-0.56, FHR-149 bpm, EFW  14th%tile (4 lb 3 oz)  Baby A and Baby B EFW % has decreased since previous u/s     A copy of this report including all images has been saved and backed up to  a second source for retrieval if needed. All measures and details of the  anatomical scan, placentation, fluid volume and pelvic anatomy are  contained in that report.  Chari Manning 01/28/2013 11:28 AM    US Fetal Bpp W/o Non Stress  01/28/2013   TWINS FOLLOW UP SONOGRAM   Alice [name] is in the office for a follow up sonogram of a twin  gestation.  She is a 28 y.o. year old [id number] with Estimated Date of Delivery: 03/09/13  by LMP now at  [redacted]w[redacted]d weeks gestation. Thus far the pregnancy has been  otherwise complicated by twins .   The twins are dichorionic/diamniotic.   TWIN A GESTATION:  PRESENTATION: breech frank  FETAL ACTIVITY:          Heart rate         165 bpm          The fetus is active.  AMNIOTIC FLUID: The amniotic fluid volume is  normal, 6.1 cm single deepest pocket  PLACENTA LOCALIZATION:  anterior GRADE 2  CERVIX: Unable to visualize  ADNEXA: The adnexa appears normal.   GESTATIONAL AGE AND  BIOMETRICS:  Gestational criteria: Estimated Date of Delivery: 03/09/13 by LMP now at  [redacted]w[redacted]d  Previous Scans:              BIPARIETAL DIAMETER           8.52 cm         34+2 weeks  HEAD CIRCUMFERENCE           30.1 cm         33+3 weeks  ABDOMINAL CIRCUMFERENCE           27.24 cm         31+2 weeks  FEMUR LENGTH           6.49 cm         33+3 weeks                                                           AVERAGE EGA(BY THIS SCAN):   33+1 weeks  ESTIMATED FETAL WEIGHT:        1964  grams, 17th %     ANATOMICAL SURVEY                                                                              COMMENTS CEREBRAL VENTRICLES yes normal   CHOROID PLEXUS yes normal   CEREBELLUM no    CISTERNA MAGNA yes normal   NUCHAL REGION yes normal   ORBITS yes normal   NASAL BONE no    NOSE/LIP no    FACIAL PROFILE no    4 CHAMBERED HEART no    OUTFLOW TRACTS no    DIAPHRAGM yes normal   STOMACH yes normal   RENAL REGION     BLADDER     CORD INSERTION     3 VESSEL CORD yes normal   SPINE     ARMS/HANDS     LEGS/FEET     GENITALIA   female        SUSPECTED ABNORMALITIES:  no  QUALITY OF SCAN: Satisfactory  BIOPHYSCIAL PROFILE:                                                                                                       COMMENTS GROSS BODY MOVEMENT                 2   TONE                2   RESPIRATIONS                2   AMNIOTIC FLUID                2                                                            SCORE:  8/8 (Note: NST was not performed as part of this antepartum testing)   DOPPLER FLOW STUDIES: UMBILICAL ARTERY RI RATIOS:   0.53    TWIN B  GESTATION:  PRESENTATION: breech frank  FETAL ACTIVITY:          Heart rate         149 bpm          The fetus is active.  AMNIOTIC FLUID: The amniotic fluid volume is  normal, 2.7 cm single deepest pocket.  PLACENTA LOCALIZATION:  posterior GRADE 2  GESTATIONAL AGE AND  BIOMETRICS:  Gestational criteria: Estimated Date of Delivery: 03/09/13 by LMP now at  [redacted]w[redacted]d  Previous Scans:  BIPARIETAL DIAMETER           7.88 cm         31+4 weeks(lags >3wks)  HEAD CIRCUMFERENCE           30.03 cm         33+2 weeks  ABDOMINAL CIRCUMFERENCE           26.39 cm         30+4 weeks (lagas  >3wks)  FEMUR LENGTH           6.61 cm         34+0 weeks                                                           AVERAGE EGA(BY THIS SCAN):   32+3 weeks                                                 ESTIMATED FETAL WEIGHT:        1910  grams, 14th %     ANATOMICAL SURVEY                                                                             COMMENTS  CEREBRAL VENTRICLES yes normal   CHOROID PLEXUS no normal   CEREBELLUM yes normal   CISTERNA MAGNA yes normal   NUCHAL REGION yes normal   ORBITS yes normal   NASAL BONE     NOSE/LIP     FACIAL PROFILE     4 CHAMBERED HEART     OUTFLOW TRACTS     DIAPHRAGM yes normal   STOMACH yes normal   RENAL REGION     BLADDER     CORD INSERTION     3 VESSEL CORD yes normal   SPINE     ARMS/HANDS     LEGS/FEET     GENITALIA   female        SUSPECTED ABNORMALITIES:  no  QUALITY OF SCAN: Satisfactory   BIOPHYSCIAL PROFILE:                                                                                                       COMMENTS GROSS BODY MOVEMENT                 2   TONE                2   RESPIRATIONS  2   AMNIOTIC FLUID                2                                                            SCORE:  8/8 (Note: NST was not performed as part of this antepartum testing)   DOPPLER FLOW STUDIES: UMBILICAL ARTERY RI RATIOS:   0.56   The current discrepancy in  fetal weight between the twins is 3% which  is  not clinically relevant.   TECHNICIAN COMMENTS:  U/S(34+2wks)-twin IUP, Baby A on maternal Rt side, Baby B on maternal Lt  side, EFW discordance=3% Baby A- breech, active fetus BPP 8/8, fluid wnl single deepest  pocket=6.1cm, female fetus "Sophia", UA Doppler RI-0.53, FHR- 165 bpm, EFW  17Th%tile (4 lb 5 oz),  Baby B-breech, active fetus, BPP 8/8, fluid wnl single deepest  pocket=2.7cm, female fetus "Mason", UA Doppler RI-0.56, FHR-149 bpm, EFW  14th%tile (4 lb 3 oz)  Baby A and Baby B EFW % has decreased since previous u/s     A copy of this report including all images has been saved and backed up to  a second source for retrieval if needed. All measures and details of the  anatomical scan, placentation, fluid volume and pelvic anatomy are  contained in that report.  Chari Manning 01/28/2013 11:28 AM    [place] Ua Doppler Add'l Gest Re-eval  02/18/2013   TWINS FOLLOW UP SONOGRAM   Alice [name] is in the  office for a follow up sonogram of a twin  gestation.  She is a 28 y.o. year old [id number] with Estimated Date of Delivery: 03/09/13  now at  [redacted]w[redacted]d weeks gestation. Thus far the pregnancy has been otherwise  complicated by twin IUP and breech presentation.   The twins are dichorionic/diamniotic.   TWIN A GESTATION:   PRESENTATION: breech FRANK  FETAL ACTIVITY:          Heart rate         145 bpm          The fetus is active.  AMNIOTIC FLUID: The amniotic fluid volume is  normal, single deepest pocket-4.6 cm.  PLACENTA LOCALIZATION:  posterior GRADE 3  CERVIX: Unable to visualize  ADNEXA: The adnexa appears wnl   GESTATIONAL AGE AND  BIOMETRICS:  Gestational criteria: Estimated Date of Delivery: 03/09/13  now at [redacted]w[redacted]d  BIOPHYSCIAL PROFILE:                                                                                                       COMMENTS GROSS BODY MOVEMENT                 2   TONE                2   RESPIRATIONS  2   AMNIOTIC FLUID                2                                                            SCORE:  8/8 (Note: NST was not performed as part of this antepartum testing)   DOPPLER FLOW STUDIES: UMBILICAL ARTERY RI RATIOS:   0.55,    SUSPECTED ABNORMALITIES:  Breech presentation  QUALITY OF SCAN: Sub-optimal   TWIN B  GESTATION:   PRESENTATION: cephalic  FETAL ACTIVITY:          Heart rate         140 bpm          The fetus is active.  AMNIOTIC FLUID: The amniotic fluid volume is  normal, single deepest pocket-3.1 cm.  PLACENTA LOCALIZATION:  anterior GRADE 2   GESTATIONAL AGE AND  BIOMETRICS:  Gestational criteria: Estimated Date of Delivery: 03/09/13  now at [redacted]w[redacted]d  BIOPHYSCIAL PROFILE:                                                                                                       COMMENTS GROSS BODY MOVEMENT                 2   TONE                2   RESPIRATIONS                2   AMNIOTIC FLUID                2                                                            SCORE:   8/8 (Note: NST was not performed as part of this antepartum testing)   DOPPLER FLOW STUDIES: UMBILICAL ARTERY RI RATIOS:   0.60,   SUSPECTED ABNORMALITIES:  no  QUALITY OF SCAN: Sub-optimal   TECHNICIAN COMMENTS:  U/S(37+2wks)-twin IUP (Di/Di), +membrane noted, Baby A-(maternal Rt side) BREECH (frank), BPP 8/8, fluid wnl single  deepest pocket noted-4.6cm, posterior gr 3 placenta noted, female fetus  "Sophia", FHR-145 bpm, UA Doppler RI-0.55 Baby B-(maternal Lt side) VERTEX, BPP 8/8, fluid wnl single deepest pocket  noted-3.1cm, anterior Gr 2 placenta noted, female fetus "Mason", FHR-140  bpm, UA Doppler RI-0.60        A copy of this report including all images has been saved and backed up to  a second source for retrieval if needed. All measures and details of the  anatomical scan, placentation, fluid volume and pelvic anatomy are  contained in that report.  Chari Manning 02/18/2013 3:14 PM    [place] Ua Doppler Add'l Gest Re-eval  02/12/2013   TWINS FOLLOW UP SONOGRAM   Alice [name] is in the office for a follow up sonogram of a twin  gestation.  She is a 28 y.o. year old [id number] with Estimated Date of Delivery: 03/09/13  now at  [redacted]w[redacted]d weeks gestation. Thus far the pregnancy has been otherwise  complicated by [name]/o PTd and elevated LFT's.   The twins are dichorionic/diamniotic.   TWIN A GESTATION:  PRESENTATION: breech FRANK  FETAL ACTIVITY:          Heart rate         137 bpm          The fetus is active.  AMNIOTIC FLUID: The amniotic fluid volume is  normal, single deepest pocket=2.4cm  PLACENTA LOCALIZATION:   GRADE 2  CERVIX: Unable to visualize  ADNEXA: The adnexa appears normal.   GESTATIONAL AGE AND  BIOMETRICS:  Gestational criteria: Estimated Date of Delivery: 03/09/13  now at [redacted]w[redacted]d  Previous Scans:10              BIPARIETAL DIAMETER           8.77 cm         35+3 weeks  HEAD CIRCUMFERENCE           30.88 cm         34+3 weeks  ABDOMINAL CIRCUMFERENCE           30.69 cm         34+4 weeks  FEMUR LENGTH            6.65 cm         34+2 weeks                                                           AVERAGE EGA(BY THIS SCAN):   34+5 weeks                                                 ESTIMATED FETAL WEIGHT:        2495  grams, 18th %     ANATOMICAL SURVEY                                                                             COMMENTS CEREBRAL VENTRICLES yes normal   CHOROID PLEXUS yes normal   CEREBELLUM yes normal   CISTERNA MAGNA yes normal   NUCHAL REGION yes normal   ORBITS yes normal   NASAL BONE yes normal   NOSE/LIP yes normal   FACIAL PROFILE yes normal   4 CHAMBERED HEART yes normal   OUTFLOW TRACTS     DIAPHRAGM yes normal   STOMACH yes normal   RENAL REGION     BLADDER yes normal   CORD INSERTION  3 VESSEL CORD yes normal   SPINE     ARMS/HANDS     LEGS/FEET     GENITALIA   female        BIOPHYSCIAL PROFILE:                                                                                                       COMMENTS GROSS BODY MOVEMENT                 2   TONE                2   RESPIRATIONS                2   AMNIOTIC FLUID                2                                                            SCORE:  8/8 (Note: NST was not performed as part of this antepartum testing)   DOPPLER FLOW STUDIES: UMBILICAL ARTERY RI RATIOS:   0.60, 0.53    SUSPECTED ABNORMALITIES:  no  QUALITY OF SCAN: Satisfactory    TWIN B  GESTATION:   PRESENTATION: cephalic  FETAL ACTIVITY:          Heart rate         151 bpm          The fetus is active.  AMNIOTIC FLUID: The amniotic fluid volume is  normal, single deepest pocket=2.2 cm.  PLACENTA LOCALIZATION:  anterior GRADE 2  GESTATIONAL AGE AND  BIOMETRICS:  Gestational criteria: Estimated Date of Delivery: 03/09/13  now at [redacted]w[redacted]d  Previous Scans:10              BIPARIETAL DIAMETER           8.68 cm         35+0 weeks  HEAD CIRCUMFERENCE           30.65 cm         34+1 weeks  ABDOMINAL CIRCUMFERENCE           29.16 cm         33+1 weeks  FEMUR LENGTH           6.93 cm          35+4 weeks                                                           AVERAGE EGA(BY THIS SCAN):   34+3 weeks  ESTIMATED FETAL WEIGHT:        2391  grams, 13th %     ANATOMICAL SURVEY                                                                             COMMENTS CEREBRAL VENTRICLES yes normal   CHOROID PLEXUS yes normal   CEREBELLUM yes normal   CISTERNA MAGNA yes normal   NUCHAL REGION yes normal   ORBITS     NASAL BONE     NOSE/LIP     FACIAL PROFILE     4 CHAMBERED HEART     OUTFLOW TRACTS     DIAPHRAGM yes normal   STOMACH yes normal   RENAL REGION     BLADDER yes normal   CORD INSERTION     3 VESSEL CORD yes normal   SPINE     ARMS/HANDS     LEGS/FEET     GENITALIA   female        BIOPHYSCIAL PROFILE:                                                                                                       COMMENTS GROSS BODY MOVEMENT                 2   TONE                2   RESPIRATIONS                2   AMNIOTIC FLUID                2                                                            SCORE:  8/8 (Note: NST was not performed as part of this antepartum testing)   DOPPLER FLOW STUDIES: UMBILICAL ARTERY RI RATIOS:   0.61, 0.56    SUSPECTED ABNORMALITIES:  no  QUALITY OF SCAN: Satisfactory    The current discrepancy in  fetal weight between the twins is 4% which  is  not clinically relevant.   TECHNICIAN COMMENTS:  U/S(36+3wks)-twin IUP DI/DI, + membrane noted, EFW discordance - 4% Baby A-maternal Rt side- BREECH (FRANK), FHR-137 bpm, EFW 5 lb 8 oz  (18th%tile), fluid wnl single deepest pocket-2.4cm, UA Doppler RI- 0.60 &  0.53, female fetus "Sophia", BPP 8/8 Baby B-maternal Lt side, VTX, FHR-151 bpm, EFW 5 lb 4 oz (13th%tile),  fluid wnl single deepest pocket-2.2cm, UA Doppler RI -0.61 & 0.56, female  fetus "  Mason", BPP 8/8       A copy of this report including all images has been saved and backed up to  a second source for retrieval if needed. All  measures and details of the  anatomical scan, placentation, fluid volume and pelvic anatomy are  contained in that report.  Chari Manning 02/12/2013 3:47 PM  Clinical Impression and recommendations:  I have reviewed the sonogram results above, combined with the patient's  current clinical course, below are my impressions and any appropriate  recommendations for management based on the sonographic findings.  1.  [id number] Estimated Date of Delivery: 03/09/13 Twins variable  presentation, currently breech /vertex 2.  Normal fetal sonographic findings, minimal discordance 3.  Normal general sonographic findings  Recommend continued twice weekly assessments, will deliver at 38 weeks if  undelivered  Hancel Ion [name] 02/12/2013 4:35 PM       [place] Ua Doppler Add'l Gest Re-eval     Addendum: The twins are both vertex, not breech as documented below.  Please note  this discrepancy  Khristian Phillippi [name] 02/04/2013 2:47 PM  02/04/2013   TWINS FOLLOW UP SONOGRAM   Alice [name] is in the office for a follow up sonogram of a twin  gestation.  She is a 28 y.o. year old G36P0101 with Estimated Date of Delivery: 03/09/13   now at  [redacted]w[redacted]d weeks gestation. Thus far the pregnancy has been otherwise  complicated by [name]/o PTD and morbid obesity.   The twins are dichorionic/diamniotic.   TWIN A GESTATION:   PRESENTATION: cephalic  FETAL ACTIVITY:          Heart rate         148 bpm          The fetus is active.  AMNIOTIC FLUID: The amniotic fluid volume is  normal, 4.9 cm(single deepest pocket)  PLACENTA LOCALIZATION:  anterior GRADE  2  CERVIX: Unable to visualize  ADNEXA: The adnexa appears normal.   GESTATIONAL AGE AND  BIOMETRICS:  Gestational criteria: Estimated Date of Delivery: 03/09/13  now at [redacted]w[redacted]d  BIOPHYSCIAL PROFILE:                                                                                                       COMMENTS GROSS BODY MOVEMENT                 2   TONE                2   RESPIRATIONS                2   AMNIOTIC  FLUID                2                                                            SCORE:  8/8 (Note: NST was not performed as part of this antepartum testing)   DOPPLER FLOW STUDIES: UMBILICAL ARTERY RI RATIOS:   0.56    SUSPECTED ABNORMALITIES:  no  QUALITY OF SCAN: Satisfactory    TWIN B  GESTATION:   PRESENTATION: cephalic  FETAL ACTIVITY:          Heart rate         167 bpm          The fetus is active.  AMNIOTIC FLUID: The amniotic fluid volume is  normal, 3.3 cm(single deepest pocket).  PLACENTA LOCALIZATION:  posterior  GRADE 2  GESTATIONAL AGE AND  BIOMETRICS:  Gestational criteria: Estimated Date of Delivery: 03/09/13 now at [redacted]w[redacted]d    BIOPHYSCIAL PROFILE:                                                                                                       COMMENTS GROSS BODY MOVEMENT                 2   TONE                2   RESPIRATIONS                2   AMNIOTIC FLUID                2                                                            SCORE:  8/8 (Note: NST was not performed as part of this antepartum testing)   DOPPLER FLOW STUDIES: UMBILICAL ARTERY RI RATIOS:   0.62    SUSPECTED ABNORMALITIES:  no  QUALITY OF SCAN: Satisfactory   TECHNICIAN COMMENTS:  U/S(35+2wks)-Twin IUP Di/Di,  Baby A-vtx active fetus, BPP 8/8, fluid WNL AFI-4.9cm(single deepest  pocket), female fetus "Sophia" UA Doppler RI-0.56, FHR-148bpm Baby B-vtx active fetus, BPP 8/8, fluid WNL AFI-3.3cm (single deepest  pocket),  Female fetus "Mason, UA Doppler RI-0.62, FHR-167 bpm    A copy of this report including all images has been saved and backed up to  a second source for retrieval if needed. All measures and details of the  anatomical scan, placentation, fluid volume and pelvic anatomy are  contained in that report.  Chari Manning 02/04/2013 1:54 PM  Clinical Impression and recommendations:  I have reviewed the sonogram results above, combined with the patient's  current clinical course, below are my impressions and any  appropriate  recommendations for management based on the sonographic findings.  1.  [id number] Estimated Date of Delivery: 03/09/13 by  early ultrasound and  confirmed by today's sonographic dating 2.  Normal fetal sonographic findings, specifically reassuring fetal  assessment with good fluid; unfortunately breech, breech presentation  3.  Normal general sonographic findings  Recommend routine prenatal care based on this  sonogram or as clinically  indicated  Nare Gaspari [name] 02/04/2013 2:32 PM       [place] Ua Doppler Add'l Gest Re-eval  01/28/2013   TWINS FOLLOW UP SONOGRAM   [place] is in the office for a follow up sonogram of a twin  gestation.  She is a 28 y.o. year old G70P0101 with Estimated Date of Delivery: 03/09/13  by LMP now at  [redacted]w[redacted]d weeks gestation. Thus far the pregnancy has been  otherwise complicated by twins .   The twins are dichorionic/diamniotic.   TWIN A GESTATION:  PRESENTATION: breech frank  FETAL ACTIVITY:          Heart rate         165 bpm          The fetus is active.  AMNIOTIC FLUID: The amniotic fluid volume is  normal, 6.1 cm single deepest pocket  PLACENTA LOCALIZATION:  anterior GRADE 2  CERVIX: Unable to visualize  ADNEXA: The adnexa appears normal.   GESTATIONAL AGE AND  BIOMETRICS:  Gestational criteria: Estimated Date of Delivery: 03/09/13 by LMP now at  [redacted]w[redacted]d  Previous Scans:              BIPARIETAL DIAMETER           8.52 cm         34+2 weeks  HEAD CIRCUMFERENCE           30.1 cm         33+3 weeks  ABDOMINAL CIRCUMFERENCE           27.24 cm         31+2 weeks  FEMUR LENGTH           6.49 cm         33+3 weeks                                                           AVERAGE EGA(BY THIS SCAN):   33+1 weeks                                                 ESTIMATED FETAL WEIGHT:        1964  grams, 17th %     ANATOMICAL SURVEY                                                                             COMMENTS CEREBRAL VENTRICLES yes normal   CHOROID PLEXUS yes normal    CEREBELLUM no    CISTERNA MAGNA yes normal   NUCHAL REGION yes normal   ORBITS yes normal   NASAL BONE no    NOSE/LIP no    FACIAL PROFILE no    4 CHAMBERED HEART no    OUTFLOW TRACTS no    DIAPHRAGM yes normal   STOMACH yes normal   RENAL REGION  BLADDER     CORD INSERTION     3 VESSEL CORD yes normal   SPINE     ARMS/HANDS     LEGS/FEET     GENITALIA   female        SUSPECTED ABNORMALITIES:  no  QUALITY OF SCAN: Satisfactory  BIOPHYSCIAL PROFILE:                                                                                                       COMMENTS GROSS BODY MOVEMENT                 2   TONE                2   RESPIRATIONS                2   AMNIOTIC FLUID                2                                                            SCORE:  8/8 (Note: NST was not performed as part of this antepartum testing)   DOPPLER FLOW STUDIES: UMBILICAL ARTERY RI RATIOS:   0.53    TWIN B  GESTATION:  PRESENTATION: breech frank  FETAL ACTIVITY:          Heart rate         149 bpm          The fetus is active.  AMNIOTIC FLUID: The amniotic fluid volume is  normal, 2.7 cm single deepest pocket.  PLACENTA LOCALIZATION:  posterior GRADE 2  GESTATIONAL AGE AND  BIOMETRICS:  Gestational criteria: Estimated Date of Delivery: 03/09/13 by LMP now at  [redacted]w[redacted]d  Previous Scans:              BIPARIETAL DIAMETER           7.88 cm         31+4 weeks(lags >3wks)  HEAD CIRCUMFERENCE           30.03 cm         33+2 weeks  ABDOMINAL CIRCUMFERENCE           26.39 cm         30+4 weeks (lagas  >3wks)  FEMUR LENGTH           6.61 cm         34+0 weeks                                                           AVERAGE EGA(BY THIS SCAN):   32+3 weeks  ESTIMATED FETAL WEIGHT:        1910  grams, 14th %     ANATOMICAL SURVEY                                                                             COMMENTS CEREBRAL VENTRICLES yes normal   CHOROID PLEXUS no normal   CEREBELLUM yes normal   CISTERNA  MAGNA yes normal   NUCHAL REGION yes normal   ORBITS yes normal   NASAL BONE     NOSE/LIP     FACIAL PROFILE     4 CHAMBERED HEART     OUTFLOW TRACTS     DIAPHRAGM yes normal   STOMACH yes normal   RENAL REGION     BLADDER     CORD INSERTION     3 VESSEL CORD yes normal   SPINE     ARMS/HANDS     LEGS/FEET     GENITALIA   female        SUSPECTED ABNORMALITIES:  no  QUALITY OF SCAN: Satisfactory   BIOPHYSCIAL PROFILE:                                                                                                       COMMENTS GROSS BODY MOVEMENT                 2   TONE                2   RESPIRATIONS                2   AMNIOTIC FLUID                2                                                            SCORE:  8/8 (Note: NST was not performed as part of this antepartum testing)   DOPPLER FLOW STUDIES: UMBILICAL ARTERY RI RATIOS:   0.56   The current discrepancy in  fetal weight between the twins is 3% which  is  not clinically relevant.   TECHNICIAN COMMENTS:  U/S(34+2wks)-twin IUP, Baby A on maternal Rt side, Baby B on maternal Lt  side, EFW discordance=3% Baby A- breech, active fetus BPP 8/8, fluid wnl single deepest  pocket=6.1cm, female fetus "Sophia", UA Doppler RI-0.53, FHR- 165 bpm, EFW  17Th%tile (4 lb 5 oz),  Baby B-breech, active fetus, BPP 8/8, fluid wnl single deepest  pocket=2.7cm, female fetus "Mason", UA Doppler RI-0.56, FHR-149 bpm, EFW  14th%tile (4 lb  3 oz)  Baby A and Baby B EFW % has decreased since previous u/s     A copy of this report including all images has been saved and backed up to  a second source for retrieval if needed. All measures and details of the  anatomical scan, placentation, fluid volume and pelvic anatomy are  contained in that report.  Chari Manning 01/28/2013 11:28 AM       Assessment: [redacted]w[redacted]d Estimated Date of Delivery: 03/09/13  Twin Gestation, twin A breech Patient Active Problem List   Diagnosis Date Noted  . Multiple gestation 02/15/2013  . Malpresentation  before onset of labor 02/15/2013  . Vaginal bleeding in pregnancy 01/22/2013  . Twin pregnancy, antepartum 01/14/2013  . Elevated liver function tests 09/24/2012  . Round ligament pain 09/10/2012  . Supervision of high-risk pregnancy 07/23/2012  . Twin gestation, dichorionic diamniotic 07/23/2012  . 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 History of recurrent UTI (urinary tract infection) 07/23/2012  . Obesity, morbid, BMI 50 or higher 07/23/2012  . [name]/O preterm delivery, currently pregnant 07/23/2012    Plan: Primary Caesarean section for malpresentation of Twin A  Pt understands the risks of surgery including but not limited t  excessive bleeding requiring transfusion or reoperation, post-operative infection requiring prolonged hospitalization or re-hospitalization and antibiotic therapy, and damage to other organs including bladder, bowel, ureters and major vessels.  The patient also understands the alternative treatment options which were discussed in full.  All questions were answered.  Quin Mcpherson [name] 02/22/2013 7:16 AM   Wilmont Olund [name] 02/22/2013 7:12 AM

## 2013-02-22 NOTE — Anesthesia Postprocedure Evaluation (Signed)
  Anesthesia Post-op Note  Patient: Alice MediaBrittany L Monday  Procedure(s) Performed: Procedure(s): CESAREAN SECTION/TWINS (N/A)  Patient Location: Women's Unit  Anesthesia Type:Epidural  Level of Consciousness: awake, alert  and oriented  Airway and Oxygen Therapy: Patient Spontanous Breathing  Post-op Pain: none  Post-op Assessment: Post-op Vital signs reviewed and Patient's Cardiovascular Status Stable  Post-op Vital Signs: Reviewed and stable  Complications: No apparent anesthesia complications

## 2013-02-22 NOTE — Progress Notes (Signed)
UR completed 

## 2013-02-22 NOTE — Anesthesia Procedure Notes (Signed)
Epidural Patient location during procedure: OB Start time: 02/22/2013 7:43 AM  Staffing Anesthesiologist: Brayton CavesJACKSON, Zakai Gonyea Performed by: anesthesiologist   Preanesthetic Checklist Completed: patient identified, site marked, surgical consent, pre-op evaluation, timeout performed, IV checked, risks and benefits discussed and monitors and equipment checked  Epidural Patient position: sitting Prep: site prepped and draped and DuraPrep Patient monitoring: continuous pulse ox and blood pressure Approach: midline Injection technique: LOR air and LOR saline  Needle:  Needle type: Tuohy  Needle gauge: 17 G Needle length: 9 cm and 9 Needle insertion depth: 10.5 cm Catheter type: closed end flexible Catheter size: 19 Gauge Catheter at skin depth: 15 cm Test dose: negative  Assessment Events: blood not aspirated, injection not painful, no injection resistance, negative IV test and no paresthesia  Additional Notes Patient identified.  Risk benefits discussed including failed block, incomplete pain control, headache, nerve damage, paralysis, blood pressure changes, nausea, vomiting, reactions to medication both toxic or allergic, and postpartum back pain.  Patient expressed understanding and wished to proceed.  All questions were answered.  Sterile technique used throughout procedure and epidural site dressed with sterile barrier dressing. No paresthesia or other complications noted.The patient did not experience any signs of intravascular injection such as tinnitus or metallic taste in mouth nor signs of intrathecal spread such as rapid motor block. Please see nursing notes for vital signs.  Multiple attempts at spinal but too deep. Able to get LOR with 9cm touhy but adipose tissue tented deeply.  Decision to thread catheter rather than try to use spinal needle based on BMI and risk of case continuing too long.  Also very difficult to keep touhy in appropriate depth.

## 2013-02-22 NOTE — Transfer of Care (Signed)
Immediate Anesthesia Transfer of Care Note  Patient: Alice [name]  Procedure(s) Performed: Procedure(s): CESAREAN SECTION/TWINS (N/A)  Patient Location: PACU  Anesthesia Type:Epidural  Level of Consciousness: awake, alert  and oriented  Airway & Oxygen Therapy: Patient Spontanous Breathing and Patient connected to nasal cannula oxygen  Post-op Assessment: Report given to PACU RN and Post -op Vital signs reviewed and stable  Post vital signs: Reviewed and stable  Complications: No apparent anesthesia complications

## 2013-02-22 NOTE — Lactation Note (Signed)
This note was copied from the chart of BoyA Kealey Hoot. Lactation Consultation Note  Patient Name: BoyA Kallee Erno Today's Date: 02/22/2013 Reason for consult: Initial assessment;Multiple gestation;NICU baby   Maternal Data Providing Breastmilk for your baby in NICU and breastfeeding consultation services and support information given to patient.  Babies are in the NICU and mom has pumped once and getting ready to pump again.  Reviewed pumping schedule and hand expression.  Mom breastfed her first baby for 6 months and then pumped and also supplemented with formula.  She has a medela DEBP at home.  Encouraged to call for concerns/assist prn. Has patient been taught Hand Expression?: Yes Does the patient have breastfeeding experience prior to this delivery?: Yes  Feeding    LATCH Score/Interventions                      Lactation Tools Discussed/Used Pump Review: Setup, frequency, and cleaning;Milk Storage Initiated by:: RN Date initiated:: 02/22/13   Consult Status Consult Status: Follow-up Date: 02/23/13 Follow-up type: In-patient    Powell, Dannell Gortney Ann 02/22/2013, 5:44 PM    

## 2013-02-22 NOTE — Anesthesia Preprocedure Evaluation (Signed)
Anesthesia Evaluation  Patient identified by MRN, date of birth, ID band Patient awake    Reviewed: Allergy & Precautions, H&P , Patient's Chart, lab work & pertinent test results  History of Anesthesia Complications (+) PONV  Airway Mallampati: II TM Distance: >3 FB Neck ROM: full    Dental no notable dental hx.    Pulmonary former smoker,  breath sounds clear to auscultation  Pulmonary exam normal       Cardiovascular Exercise Tolerance: Good Rhythm:regular Rate:Normal     Neuro/Psych    GI/Hepatic   Endo/Other  Morbid obesity  Renal/GU      Musculoskeletal   Abdominal   Peds  Hematology   Anesthesia Other Findings   Reproductive/Obstetrics                           Anesthesia Physical Anesthesia Plan  ASA: III  Anesthesia Plan: Spinal   Post-op Pain Management:    Induction:   Airway Management Planned:   Additional Equipment:   Intra-op Plan:   Post-operative Plan:   Informed Consent: I have reviewed the patients History and Physical, chart, labs and discussed the procedure including the risks, benefits and alternatives for the proposed anesthesia with the patient or authorized representative who has indicated his/her understanding and acceptance.   Dental Advisory Given  Plan Discussed with: CRNA  Anesthesia Plan Comments: (Lab work confirmed with CRNA in room. Platelets okay. Discussed spinal anesthetic, and patient consents to the procedure:  included risk of possible headache,backache, failed block, allergic reaction, and nerve injury. This patient was asked if she had any questions or concerns before the procedure started. )        Anesthesia Quick Evaluation

## 2013-02-23 ENCOUNTER — Encounter (HOSPITAL_COMMUNITY): Payer: Self-pay | Admitting: Obstetrics & Gynecology

## 2013-02-23 LAB — CBC
HEMATOCRIT: 26.3 % — AB (ref 36.0–46.0)
HEMOGLOBIN: 8.9 g/dL — AB (ref 12.0–15.0)
MCH: 30.2 pg (ref 26.0–34.0)
MCHC: 33.8 g/dL (ref 30.0–36.0)
MCV: 89.2 fL (ref 78.0–100.0)
Platelets: 150 10*3/uL (ref 150–400)
RBC: 2.95 MIL/uL — ABNORMAL LOW (ref 3.87–5.11)
RDW: 13.5 % (ref 11.5–15.5)
WBC: 9.2 10*3/uL (ref 4.0–10.5)

## 2013-02-23 NOTE — Lactation Note (Signed)
This note was copied from the chart of Alice Braylin Zappia. Lactation Consultation Note  Patient Name: Alice [name] Today's Date: 02/23/2013 Reason for consult: Follow-up assessment Mom reports she is pumping every 3 hours and is starting to get increased volume. Experienced BF mom, exclusively for 5 months. Mom has DEBP, Medela, at home. Recommended to mom not to use Evenflo pump that she also has. Using #27 flange, enc to switch sizes if needed for comfort--mom has additional flanges. Mom enc to visit and enjoy STS with babies while pumping and not to watch bottles while using DEBP.   Maternal Data Formula Feeding for Exclusion: Yes Reason for exclusion: Admission to Intensive Care Unit (ICU) post-partum  Feeding Feeding Type: Breast Milk with Formula added Nipple Type: Slow - flow Length of feed: 4 min  LATCH Score/Interventions                      Lactation Tools Discussed/Used Tools: Pump (Per mom has DEBP Medela at home.) Breast pump type: Double-Electric Breast Pump   Consult Status Consult Status: Follow-up Date: 02/24/13 Follow-up type: In-patient    Mustaf Antonacci 02/23/2013, 5:33 PM    

## 2013-02-23 NOTE — Progress Notes (Signed)
Subjective:sore, appetite  ok Postpartum Day 1: Cesarean Delivery Patient reports incisional pain and tolerating PO.    Objective: Vital signs in last 24 hours: Temp:  [97.2 F (36.2 C)-98.4 F (36.9 C)] 97.8 F (36.6 C) (01/10 0555) Pulse Rate:  [80-106] 80 (01/10 0555) Resp:  [14-20] 20 (01/10 0555) BP: (88-126)/(62-81) 108/74 mmHg (01/10 0555) SpO2:  [96 %-100 %] 98 % (01/10 0555) Weight:  [385 lb (174.635 kg)] 385 lb (174.635 kg) (01/09 0930)  Physical Exam:  General: alert, cooperative and no distress Lochia: appropriate Uterine Fundus: firm Incision: dressing intact with Pico vac in place DVT Evaluation: No evidence of DVT seen on physical exam.   Recent Labs  02/21/13 1333 02/23/13 0545  HGB 12.7 8.9*  HCT 36.6 26.3*    Assessment/Plan: Status post Cesarean section. Doing well postoperatively.  Continue current care.  Timon Geissinger 02/23/2013, 7:29 AM

## 2013-02-24 MED ORDER — PANTOPRAZOLE SODIUM 40 MG PO TBEC
40.0000 mg | DELAYED_RELEASE_TABLET | Freq: Every day | ORAL | Status: DC
  Administered 2013-02-24 – 2013-02-25 (×2): 40 mg via ORAL
  Filled 2013-02-24 (×2): qty 1

## 2013-02-24 MED ORDER — CALCIUM CARBONATE ANTACID 500 MG PO CHEW
1.0000 | CHEWABLE_TABLET | Freq: Three times a day (TID) | ORAL | Status: DC | PRN
  Administered 2013-02-24: 200 mg via ORAL
  Filled 2013-02-24 (×2): qty 1

## 2013-02-24 MED ORDER — FAMOTIDINE 20 MG PO TABS: 20.0000 mg | ORAL_TABLET | Freq: Once | ORAL | Status: DC

## 2013-02-24 NOTE — Plan of Care (Signed)
Problem: Discharge Progression Outcomes Goal: Barriers To Progression Addressed/Resolved Outcome: Completed/Met Date Met:  02/24/13 Voids without difficulty Goal: Activity appropriate for discharge plan Outcome: Completed/Met Date Met:  02/24/13 Patient tolerates walking in halls well. Goal: Tolerating diet Outcome: Completed/Met Date Met:  02/24/13 Tolerates a Regular diet well. Goal: Complications resolved/controlled Outcome: Completed/Met Date Met:  02/24/13 Voiding adequate amount of urine,denies dysuria.

## 2013-02-24 NOTE — Lactation Note (Signed)
This note was copied from the chart of [place]. Lactation Consultation Note  Patient Name: Alice [name] [id number] Date: 01-18-14 Reason for consult: Follow-up assessment Per mom pumping about every 3 hours , the most volume =20 ml. Discussed with mom it can be up and down for about 72 hours.  Mom has a DEBP Medela at home. LC mentioned when she goes home  tomorrow ot plan when visiting the babies to plan on using her DEBP kit in NICU.    Maternal Data    Feeding Feeding Type: Breast Milk with Formula added Nipple Type: Slow - flow Length of feed: 10 min  LATCH Score/Interventions                      Lactation Tools Discussed/Used     Consult Status Consult Status: Follow-up Date: Dec 10, 2013 Follow-up type: In-patient    Myer Haff 02/28/2013, 2:07 PM

## 2013-02-24 NOTE — Progress Notes (Signed)
Subjective: Postpartum Day 2: Cesarean Delivery with wound vac  Patient reports abdominal pain, currently ranked 4/10 and relieved by meds.  Mostly at site of wound vac and some discomfort due to constipation.  No bowel movement.  Passing some flatus.  Patient is ambulating well and tolerating PO.  Patient is pumping.  Wants Depo for MOC.  Objective: Vital signs in last 24 hours: Temp:  [97.3 F (36.3 C)-98.1 F (36.7 C)] 97.8 F (36.6 C) (01/11 0607) Pulse Rate:  [77-97] 77 (01/11 0607) Resp:  [18-20] 20 (01/11 0607) BP: (97-131)/(53-83) 97/59 mmHg (01/11 0607) SpO2:  [97 %-100 %] 97 % (01/11 16100607)  Physical Exam:  General: alert, cooperative, fatigued and no distress Abdomen:  Soft, +BS Lochia: appropriate Uterine Fundus: firm Incision: dressing intact with Pico vac DVT Evaluation:  1+ pitting edema bilaterally up to mid-calf.  No calf tenderness.  Negative Homan's sign.   Recent Labs  02/21/13 1333 02/23/13 0545  HGB 12.7 8.9*  HCT 36.6 26.3*    Assessment/Plan: Status post Cesarean section. Doing well postoperatively.  Continue current care including pain control and bowel regimen.  Encouraged ambulation and good PO intake. Discharge planning for tomorrow.  TUCKER, BRITTON L 02/24/2013, 7:20 AM  I spoke with and examined patient and agree with PA-S's note and plan of care.  Tawana ScaleMichael Ryan Gerrald Basu, MD Ob Fellow 02/24/2013 7:55 AM

## 2013-02-24 NOTE — Progress Notes (Signed)
Clinical Social Work Department PSYCHOSOCIAL ASSESSMENT - MATERNAL/CHILD 02/24/2013  Patient:  Alice [name], Alice [name]  Account Number:  [id number]  Admit Date:  02/22/2013  [name] Name:   Alice [name] A: Alice [name]  Twin B: Alice [name]    Clinical Social Worker:  Alice Quinonez, LCSW   Date/Time:  02/23/2013 09:45 AM  Date Referred:  02/23/2013   Referral source  NICU     Referred reason  NICU   Other referral source:    I:  FAMILY / [place] legal guardian:  PARENT  Guardian - Name Guardian - Age Guardian - Address  Alice [name] [address], [place] [address]  Alice [name]  same as above   Other household support members/support persons Other support:    II  PSYCHOSOCIAL DATA Information Source:    Occupational hygienist Employment:   Father employed   Museum/gallery curator resources:  [employer] If Alice [name]:   Other  [place] / Grade:   Maternity Care Coordinator / Child Services Coordination / Early Interventions:  Cultural issues impacting care:    III  STRENGTHS Strengths  Understanding of illness  Home prepared for Child (including basic supplies)  Adequate Resources  Supportive family/friends   Strength comment:    IV  RISK FACTORS AND CURRENT PROBLEMS Current Problem:       V  SOCIAL WORK ASSESSMENT Met with both parents and maternal grandmother.  They were pleasant and receptive to social work intervention. Parents are married.  They have one other dependent age 40. Spouse is employed as a Administrator.  Family seems to be coping well with the twins NICU admission.  Informed that they had a little trouble breathing but has improved since and may only need to remain in the unit for about 3 days. Mother communicate hopes that they will be ready for discharge with her on Monday.  She denies any hx of substance abuse or mental illness.  No acute social concerns related at this time.  Informed her of social work  availability      VI SOCIAL WORK PLAN Social Work Plan  Psychosocial Support/Ongoing Assessment of Needs   Type of pt/family education:   If child protective services report - county:   If child protective services report - date:   Information/referral to community resources comment:   Other social work plan:

## 2013-02-25 ENCOUNTER — Encounter (HOSPITAL_COMMUNITY): Payer: Self-pay | Admitting: Obstetrics & Gynecology

## 2013-02-25 ENCOUNTER — Ambulatory Visit: Payer: Self-pay

## 2013-02-25 ENCOUNTER — Encounter (HOSPITAL_COMMUNITY)
Admission: RE | Disposition: A | Discharge status: Home / Self Care | Payer: Self-pay | Source: Ambulatory Visit | Attending: Obstetrics & Gynecology

## 2013-02-25 SURGERY — Surgical Case
Anesthesia: Regional

## 2013-02-25 MED ORDER — OXYCODONE-ACETAMINOPHEN 5-325 MG PO TABS: 1.0000 | ORAL_TABLET | ORAL | Status: DC | PRN

## 2013-02-25 MED ORDER — IBUPROFEN 600 MG PO TABS: 600.0000 mg | ORAL_TABLET | Freq: Four times a day (QID) | ORAL | Status: DC

## 2013-02-25 NOTE — Lactation Note (Signed)
This note was copied from the chart of [name]. Lactation Consultation Note     Follow up consult with this mom of term twins, in NICU, now 72 hours post partum. Mom is being discharged to home today, and has a DEP at home to use. She is transitioing well into mature milk, and is expressing 45 - 60 mls at a time. Mom knows to call for lactation as needed, for questions/concerns, and that I will follow her and babies in the nICU.  Patient Name: [name] Today's Date: 02/25/2013 Reason for consult: Follow-up assessment;NICU baby;Multiple gestation   Maternal Data    Feeding Feeding Type: Breast Milk with Formula added Nipple Type: Slow - flow Length of feed: 20 min  LATCH Score/Interventions                      Lactation Tools Discussed/Used WIC Program: No Pump Review: Setup, frequency, and cleaning;Milk Storage (mom advised to increase duration of pumping to up to 30 minutes now, until she stops dripping)   Consult Status Consult Status: Follow-up Follow-up type:  (prn in NICU)    Alice [name] 02/25/2013, 11:37 AM    

## 2013-02-25 NOTE — Discharge Summary (Signed)
Obstetric Discharge Summary Reason for Admission: cesarean section for di/di twins with baby A breech.  Prenatal Procedures: none Intrapartum Procedures: cesarean: low cervical, transverse Postpartum Procedures: none Complications-Operative and Postpartum: none Hemoglobin  Date Value Range Status  02/23/2013 8.9* 12.0 - 15.0 g/dL Final     DELTA CHECK NOTED     REPEATED TO VERIFY     HCT  Date Value Range Status  02/23/2013 26.3* 36.0 - 46.0 % Final    Physical Exam:  General: alert, cooperative and morbidly obese Lochia: appropriate Uterine Fundus: firm Incision: healing well, no significant drainage, no significant erythema. PICO in place.  DVT Evaluation: No evidence of DVT seen on physical exam. Negative Homan's sign. Mild Right Calf edema is present.  Discharge Diagnoses: Term Pregnancy-delivered  Discharge Information: Date: 02/25/2013 Activity: pelvic rest Diet: routine Medications: PNV, Ibuprofen, Colace and Percocet Condition: stable Instructions: refer to practice specific booklet Discharge to: home   Newborn Data:   Alice [name], Alice [name] [[id number]][[id number]]  Live born female  Birth Weight: 5 lb 10 oz (2550 g) APGAR: 5, 9   Alice [name], Alice [name] [[id number]][[id number]]  Live born female  Birth Weight: 5 lb 2.4 oz (2335 g) APGAR: 1, 5  Home with mother.  Alice [name], Alice [name] 02/25/2013, 8:00 AM  I have seen and examined this patient and agree with above documentation in the med student's note. Pt presented for elective c/s due to di/di twins with baby A in breech position.  C/S was uncomplicated and her postartum care was uncomplicated as well.  She is breast feeding and desires depo for contraception.  Baby A in NICU for SGA. Baby B with possible seizure like activity and respiratory distress immediately following delivery.    Rulon AbideKeli Ayyub Krall, M.D. [facility] 02/25/2013 4:47 PM

## 2013-02-25 NOTE — Progress Notes (Signed)
Pt discharged to home with significant other.  Condition stable.  Pt ambulated to NICU with plans to leave hospital from there.  Pt states she will see Dr. Despina HiddenEure in office on 03-01-13 for wound vac removal and incision check.  No equipment for home ordered at discharge.

## 2013-02-28 ENCOUNTER — Encounter (HOSPITAL_COMMUNITY): Payer: Self-pay | Admitting: *Deleted

## 2013-03-01 ENCOUNTER — Encounter (INDEPENDENT_AMBULATORY_CARE_PROVIDER_SITE_OTHER): Payer: Self-pay

## 2013-03-01 ENCOUNTER — Ambulatory Visit (INDEPENDENT_AMBULATORY_CARE_PROVIDER_SITE_OTHER): Payer: Medicaid Other | Admitting: Obstetrics and Gynecology

## 2013-03-01 ENCOUNTER — Encounter: Payer: Self-pay | Admitting: Obstetrics and Gynecology

## 2013-03-01 VITALS — BP 120/80 | Wt 374.9 lb

## 2013-03-01 DIAGNOSIS — Z9889 Other specified postprocedural states: Secondary | ICD-10-CM

## 2013-03-01 NOTE — Progress Notes (Signed)
This chart was scribed by Bennett Scrapehristina Taylor, Medical Scribe, for Dr. Christin BachJohn Liandra Mendia on 03/01/13 at 11:08 AM. This chart was reviewed by Dr. Emelda FearFerguson and is accurate.   Subjective:  Alice [name] is a 28 y.o. female who presents to the clinic 1 weeks status post c-section.    Reports "horrible" smell from the area and yellow drainage.  Review of Systems Negative except for HPI cooments  She has been eating a regular diet without difficulty.   Bowel movements are normal. Pain is controlled without any medications.  Objective:  BP 120/80  Wt 374 lb 14.4 oz (170.054 kg) General:Well developed, well nourished.  No acute distress. Abdomen: Bowel sounds normal, soft, non-tender. Pelvic Exam:    External Genitalia:  Normal.  Incision(s):   Healing well, no drainage, no erythema, no hernia, no swelling, no dehiscence, incision well approximated.  Wound dressing removed and wound vac discarded. wound vac was NOT pulling off fluid due to tape separation due to obesity/panniculus   Assessment:  Post-Op 1 weeks s/p c-section    Doing well postoperatively.   Plan:  1.Wound care discussed, soap and water 2. .Continue any current medications. 3. Activity restrictions: none 4. return to work: not applicable. 5. Follow up in 3 weeks. For depo injection.

## 2013-03-06 ENCOUNTER — Telehealth: Payer: Self-pay

## 2013-03-06 ENCOUNTER — Encounter: Payer: Self-pay | Admitting: Obstetrics and Gynecology

## 2013-03-06 ENCOUNTER — Ambulatory Visit (INDEPENDENT_AMBULATORY_CARE_PROVIDER_SITE_OTHER): Payer: Medicaid Other | Admitting: Obstetrics and Gynecology

## 2013-03-06 VITALS — BP 136/96 | Ht 69.0 in | Wt 355.0 lb

## 2013-03-06 DIAGNOSIS — Z9889 Other specified postprocedural states: Secondary | ICD-10-CM

## 2013-03-06 MED ORDER — OXYCODONE-ACETAMINOPHEN 5-325 MG PO TABS: 1.0000 | ORAL_TABLET | ORAL | Status: DC | PRN

## 2013-03-06 NOTE — Telephone Encounter (Signed)
On the left end of incision there was a stitch that has caused some pain and discomfort. Pt states that the incisions has opened a little. Stitch is pulling really bad. Pt states that she has swelling in her abdomin right above the incision. Pt advised to come in to have the incision checked out. Pt switched up front to make appointment.

## 2013-03-06 NOTE — Addendum Note (Signed)
Addended by: Tilda BurrowFERGUSON, Miyani Cronic V on: 03/06/2013 01:46 PM   Modules accepted: Orders

## 2013-03-06 NOTE — Progress Notes (Signed)
This chart was scribed by Alice [name], Medical Scribe, for Dr. Christin BachJohn Avondre [name] on 03/06/13 at 1:32 PM. This chart was reviewed by Dr. Christin BachJohn Tai [name] and is accurate.      Subjective:  Alice [name] is a 28 y.o. female who presents to the clinic 1.5 weeks status post c-section.   A problem with the incision line. States there is a "hole, like an indentation" on the lateral left side.  Review of Systems Negative except incision line problem  She has been eating a regular diet without difficulty.   Bowel movements are normal. The pt is having pain. Requesting pain medication refill. c-section for twin delivery  Objective:  BP 136/96  Ht 5\' 9"  (1.753 m)  Wt 355 lb (161.027 kg)  BMI 52.40 kg/m2  Breastfeeding? Yes  General:Well developed, well nourished.  No acute distress. Abdomen: Bowel sounds normal, soft, non-tender.  Pelvic Exam: Not performed  Incision(s):   Healing well, no erythema, no hernia, no swelling, no dehiscence, incision well approximated. One stitch removed from the lateral left side where there is a small drainage area. 2 small areas of erythema, both less than 2 mm, will not require tx at present.   Assessment:  Post-Op 1.5 weeks s/p c-section   Doing well postoperatively x for stitch inflammation left incision end.   Plan:  1.Wound care discussed. Pt's questions answered to apparent satisfaction. Abd binder prn. 2. .Continue any current medications. Will renew pain medications. 3. Activity restrictions: none 4. return to work: not applicable. 5. Follow up in 3 week. 6/ depo shot on 29 Jan.

## 2013-03-22 ENCOUNTER — Encounter: Payer: Self-pay | Admitting: Obstetrics & Gynecology

## 2013-03-22 ENCOUNTER — Ambulatory Visit (INDEPENDENT_AMBULATORY_CARE_PROVIDER_SITE_OTHER): Payer: Medicaid Other | Admitting: Obstetrics & Gynecology

## 2013-03-22 VITALS — BP 110/80 | Wt 344.4 lb

## 2013-03-22 DIAGNOSIS — Z9889 Other specified postprocedural states: Secondary | ICD-10-CM

## 2013-03-22 DIAGNOSIS — Z3202 Encounter for pregnancy test, result negative: Secondary | ICD-10-CM

## 2013-03-22 DIAGNOSIS — Z3049 Encounter for surveillance of other contraceptives: Secondary | ICD-10-CM

## 2013-03-22 DIAGNOSIS — Z309 Encounter for contraceptive management, unspecified: Secondary | ICD-10-CM

## 2013-03-22 DIAGNOSIS — Z3200 Encounter for pregnancy test, result unknown: Secondary | ICD-10-CM

## 2013-03-22 LAB — POCT URINE PREGNANCY: Preg Test, Ur: NEGATIVE

## 2013-03-22 MED ORDER — MEDROXYPROGESTERONE ACETATE 150 MG/ML IM SUSP
150.0000 mg | Freq: Once | INTRAMUSCULAR | Status: AC
  Administered 2013-03-22: 150 mg via INTRAMUSCULAR

## 2013-03-22 MED ORDER — MEDROXYPROGESTERONE ACETATE 150 MG/ML IM SUSP: 150.0000 mg | INTRAMUSCULAR | Status: DC

## 2013-03-22 NOTE — Progress Notes (Signed)
Patient ID: Alice [name], female   DOB: 1986-01-01, 28 y.o.   MRN: [id number]  Blood pressure 110/80, weight 344 lb 6.4 oz (156.219 kg), currently breastfeeding.  Incision clean dry intact  Gentian violet placed   Getting depo today  Follow up as needed

## 2013-03-25 ENCOUNTER — Ambulatory Visit: Payer: Medicaid Other | Admitting: Obstetrics and Gynecology

## 2013-04-11 ENCOUNTER — Ambulatory Visit: Payer: Medicaid Other | Admitting: Advanced Practice Midwife

## 2013-04-15 ENCOUNTER — Encounter: Payer: Self-pay | Admitting: Women's Health

## 2013-04-15 ENCOUNTER — Encounter (INDEPENDENT_AMBULATORY_CARE_PROVIDER_SITE_OTHER): Payer: Self-pay

## 2013-04-15 ENCOUNTER — Ambulatory Visit (INDEPENDENT_AMBULATORY_CARE_PROVIDER_SITE_OTHER): Payer: Medicaid Other | Admitting: Women's Health

## 2013-04-15 VITALS — BP 142/88 | Temp 98.3°F | Ht 69.0 in | Wt 348.0 lb

## 2013-04-15 DIAGNOSIS — J329 Chronic sinusitis, unspecified: Secondary | ICD-10-CM

## 2013-04-15 DIAGNOSIS — N938 Other specified abnormal uterine and vaginal bleeding: Secondary | ICD-10-CM

## 2013-04-15 DIAGNOSIS — O0990 Supervision of high risk pregnancy, unspecified, unspecified trimester: Secondary | ICD-10-CM

## 2013-04-15 MED ORDER — AZITHROMYCIN 250 MG PO TABS: ORAL_TABLET | ORAL | Status: DC

## 2013-04-15 NOTE — Progress Notes (Signed)
Patient ID: Alice [name], female   DOB: 01/11/1986, 28 y.o.   MRN: [id number] Subjective:    Alice [name] is a 28 y.o. [id number] Caucasian female who presents for a postpartum visit. She is 7 weeks postpartum following a primary cesarean section, low transverse incision d/t vtx/breech position of twin gestation at 3538 gestational weeks. Anesthesia: spinal attempted but b/c of body habitus had required epidural. She became hypotensive d/t venous return syndrome lying on back on table. I have fully reviewed the prenatal and intrapartum course. Postpartum course has been complicated by continuous bleeding since receiving depo 3wks ago. Pt also reports sinus infection and constant ha that began 2wks ago, OTC measures aren't helping. Stuffy nose, w/ greenish/bloody d/c and sinus pressure. States she has 'blacked out' for a few seconds x 2 since HA/sinus pressure began. Baby A's course has been complicated by apgars 5/9 and transfer to NICU d/t respiratory distress, now doing fine. Baby B's  course has been complicated by apgars 1/5/7, intubated in delivery room and transferred to NICU d/t respiratory distress- now doing fine. Babies are feeding by formula/breast. Bleeding Lochia x 4d after c/s then stopped. Now bleeding x 3wks after depo. . Bowel function is normal. Bladder function is normal. Patient is sexually active.  Contraception method is Depo-Provera injections. States she gives her partner injections at home. Would like to know if she could start giving herself her own depo injections- is concerned she will be losing her Mcaid and wouldn't be able to afford visits for shots. Postpartum depression screening: negative. Score 8.  Last pap 07/23/12 and was neg.  The following portions of the patient's history were reviewed and updated as appropriate: allergies, current medications, past medical history, past surgical history and problem list.  Review of Systems Pertinent items are noted in HPI.    Filed Vitals:   04/15/13 1105 04/15/13 1106  BP: 142/88   Temp:  98.3 F (36.8 C)  Height: 5\' 9"  (1.753 m)   Weight: 348 lb (157.852 kg)     Objective:     General:  alert, cooperative and no distress  HEENT: Whitish/cloudy fluid behind TM bilaterally, +tenderness frontal and ethmoid sinuses. Throat clear.    Breasts:  deferred, no complaints  Lungs: clear to auscultation bilaterally  Heart:  regular rate and rhythm  Abdomen: soft, nontender, incision well healed and edges well approximated   Vulva: normal  Vagina: normal vagina  Cervix:  closed  Corpus: Well-involuted  Adnexa:  Non-palpable  Rectal Exam: No hemorrhoids        Fingerstick Hbg: 13  Assessment:   Postpartum exam 7 wks s/p PLTCS of twins Depression screening Sinus infection w/ ha DUB on depo Breast/bottlefeeding  Plan:  Zpak to clear up sinus infection/ears which will hopefully help w/ ha Discussed option of megace for dub on depo, but not recommended w/ bf. She wants to hold off on megace for now Contraception: Depo-Provera injections, Wants to administer depo herself at home Follow up in: 1 week to f/u on sinus infection/ears/ha, or earlier as needed. Will also recheck LFTs which were elevated during pregnancy  Marge DuncansBooker, Kimberly Randall CNM, [facility]BC 04/15/2013 12:01 PM

## 2013-04-15 NOTE — Patient Instructions (Signed)

## 2013-04-22 ENCOUNTER — Ambulatory Visit (INDEPENDENT_AMBULATORY_CARE_PROVIDER_SITE_OTHER): Payer: Medicaid Other | Admitting: Women's Health

## 2013-04-22 ENCOUNTER — Encounter: Payer: Self-pay | Admitting: Women's Health

## 2013-04-22 VITALS — BP 130/82 | Ht 69.0 in | Wt 355.8 lb

## 2013-04-22 DIAGNOSIS — J329 Chronic sinusitis, unspecified: Secondary | ICD-10-CM

## 2013-04-22 DIAGNOSIS — Z3202 Encounter for pregnancy test, result negative: Secondary | ICD-10-CM

## 2013-04-22 LAB — POCT URINE PREGNANCY: Preg Test, Ur: NEGATIVE

## 2013-04-22 MED ORDER — HYDROCHLOROTHIAZIDE 12.5 MG PO CAPS: 12.5000 mg | ORAL_CAPSULE | Freq: Every day | ORAL | Status: DC

## 2013-04-22 MED ORDER — AMOXICILLIN-POT CLAVULANATE 875-125 MG PO TABS: 1.0000 | ORAL_TABLET | Freq: Two times a day (BID) | ORAL | Status: DC

## 2013-04-22 MED ORDER — FLUCONAZOLE 150 MG PO TABS: 150.0000 mg | ORAL_TABLET | Freq: Once | ORAL | Status: DC

## 2013-04-22 NOTE — Progress Notes (Signed)
Patient ID: Alice [name], female   DOB: [birth year], 28 y.o.   MRN: [id number]   [facility]  Patient name: Alice [name] MRN [id number]  Date of birth: [birth year]  CC & HPI:  Alice [name] is a 28 y.o. Caucasian female presenting today for f/u sinus infection. Took all of zpak, still has all of symptoms. Also gaining weight, feels she is retaining fluid, nausea q hs. Received 1st depo ~4wks ago. Only eating 1-2x/day, mainly salads. Breastfeeding, but milk supply is decreasing.   Pertinent History Reviewed:  Medical & Surgical Hx:   Past Medical History  Diagnosis Date  . Congenital obstruction of ureteropelvic junction (UPJ)   . History of kidney stones   . 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 History of recurrent UTIs   . Round ligament pain 09/10/2012  . Morbid obesity   . GERD (gastroesophageal reflux disease)   . Preterm labor    Past Surgical History  Procedure Laterality Date  . Nephrectomy      right side  . Nasal sinus surgery    . Tonsillectomy    . Other surgical history      ulner nerve removal  . Addenoidectomy    . Cesarean section N/A 02/22/2013    Procedure: CESAREAN SECTION/TWINS;  Surgeon: Lazaro ArmsLuther H Eure, MD;  Location: [facility];  Service: Obstetrics;  Laterality: N/A;   Medications: Reviewed & Updated - see associated section Social History: Reviewed -  reports that she quit smoking about 10 months ago. Her smoking use included Cigarettes. She smoked 0.00 packs per day. She has never used smokeless tobacco.  Objective Findings:  Vitals: BP 130/82  Ht 5\' 9"  (1.753 m)  Wt 355 lb 12.8 oz (161.39 kg)  BMI 52.52 kg/m2  LMP 03/25/2013  Breastfeeding? Yes 7lb weight gain in 1wk  Physical Examination: General appearance - alert, well appearing, and in no distress HEENT: Ears still w/ whitish/cloudy appearance behind TM bilaterally, +tenderness frontal and ethmoid sinuses and also around mastoid process  bilaterally.  Extremities: trace BLE edema  Results for orders placed in visit on 04/22/13 (from the past 24 hour(s))  POCT URINE PREGNANCY   Collection Time    04/22/13  3:05 PM      Result Value Ref Range   Preg Test, Ur Negative       Assessment & Plan:  A:   Sinus infection, not r/b zpak  Breastfeeding, decreasing supply  Some fluid retention, wants to try fluid pill P:  Rx hctz 12.5mg  daily #30, 0RF, knows may decrease milk supply  Rx augmentin 875 BID x 7d  Rx diflucan per pt request in case she gets yeast infection   To increase milk supply: increase po water intake, increase caloric intake- may also help w/ weight loss b/c    right now she is probably taking in inadequate calories per her report, fenugreek 3 capsules tid if needed  F/U 1wk to see if sinus infection is cleared and to f/u on other sx   Marge DuncansBooker, Kimberly Randall CNM, [facility] 04/22/2013 5:43 PM

## 2013-04-22 NOTE — Patient Instructions (Signed)

## 2013-04-29 ENCOUNTER — Ambulatory Visit (INDEPENDENT_AMBULATORY_CARE_PROVIDER_SITE_OTHER): Payer: Medicaid Other | Admitting: Women's Health

## 2013-04-29 ENCOUNTER — Encounter: Payer: Self-pay | Admitting: Women's Health

## 2013-04-29 ENCOUNTER — Encounter (INDEPENDENT_AMBULATORY_CARE_PROVIDER_SITE_OTHER): Payer: Self-pay

## 2013-04-29 VITALS — BP 130/92 | Ht 69.0 in | Wt 351.4 lb

## 2013-04-29 DIAGNOSIS — R51 Headache: Secondary | ICD-10-CM

## 2013-04-29 DIAGNOSIS — R7989 Other specified abnormal findings of blood chemistry: Secondary | ICD-10-CM

## 2013-04-29 DIAGNOSIS — R519 Headache, unspecified: Secondary | ICD-10-CM

## 2013-04-29 DIAGNOSIS — R945 Abnormal results of liver function studies: Principal | ICD-10-CM

## 2013-04-29 NOTE — Progress Notes (Signed)
Patient ID: Alice [name], female   DOB: 10/17/1985, 28 y.o.   MRN: [id number]   [facility]  Patient name: Alice [name] MRN [id number]  Date of birth: 10/17/1985  CC & HPI:  Alice [name] is a 28 y.o. Caucasian female presenting today for f/u. States sinus infection gone, but still having headaches- but they seem to be decreasing in frequency. Usually in crown, throbbing sensation w/ phonophobia and photophobia. HAs seem to correlate w/ spotting and fluid retention. HAs r/b tylenol migraine. Received depo ~5wks ago. Milk production decreased on 1st day of hctz, then increased when she began fenugreek.  Plans on scheduling f/u w/ urologist soon.  Pertinent History Reviewed:  Medical & Surgical Hx:   Past Medical History  Diagnosis Date  . Congenital obstruction of ureteropelvic junction (UPJ)   . History of kidney stones   . 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 History of recurrent UTIs   . Round ligament pain 09/10/2012  . Morbid obesity   . GERD (gastroesophageal reflux disease)   . Preterm labor    Past Surgical History  Procedure Laterality Date  . Nephrectomy      right side  . Nasal sinus surgery    . Tonsillectomy    . Other surgical history      ulner nerve removal  . Addenoidectomy    . Cesarean section N/A 02/22/2013    Procedure: CESAREAN SECTION/TWINS;  Surgeon: Lazaro ArmsLuther H Eure, MD;  Location: [facility];  Service: Obstetrics;  Laterality: N/A;   Medications: Reviewed & Updated - see associated section Social History: Reviewed -  reports that she quit smoking about 10 months ago. Her smoking use included Cigarettes. She smoked 0.00 packs per day. She has never used smokeless tobacco.  Objective Findings:  Vitals: BP 130/92  Ht 5\' 9"  (1.753 m)  Wt 351 lb 6.4 oz (159.394 kg)  BMI 51.87 kg/m2  LMP 03/25/2013  Breastfeeding? Yes BP retake: 124/78  Physical Examination: General appearance - alert, well appearing, and  in no distress Normal gait  No results found for this or any previous visit (from the past 24 hour(s)).   Assessment & Plan:  A:   9wks s/p PLTCS, twins  Headaches, possibly hormonal  Breastfeeding  Elevated bp, normal on retake  Elevated LFTs during pregnancy  H/O unilateral nephrectomy P:  CBC, CMP today   OK to continue HCTZ 12.5mg  daily  F/U 1wk to discuss labs, see how HA's are doing  Discussed w/ JAG- consider estrogen patch if HAs aren't improving  Pt to schedule f/u w/ urologist   Marge DuncansBooker, Kadarious Dikes Randall CNM, [facility]BC 04/29/2013 2:51 PM

## 2013-04-29 NOTE — Patient Instructions (Signed)
Schedule appointment w/ urologist Migraine Headache A migraine headache is an intense, throbbing pain on one or both sides of your head. A migraine can last for 30 minutes to several hours. CAUSES  The exact cause of a migraine headache is not always known. However, a migraine may be caused when nerves in the brain become irritated and release chemicals that cause inflammation. This causes pain. Certain things may also trigger migraines, such as:  Alcohol.  Smoking.  Stress.  Menstruation.  Aged cheeses.  Foods or drinks that contain nitrates, glutamate, aspartame, or tyramine.  Lack of sleep.  Chocolate.  Caffeine.  Hunger.  Physical exertion.  Fatigue.  Medicines used to treat chest pain (nitroglycerine), birth control pills, estrogen, and some blood pressure medicines. SIGNS AND SYMPTOMS  Pain on one or both sides of your head.  Pulsating or throbbing pain.  Severe pain that prevents daily activities.  Pain that is aggravated by any physical activity.  Nausea, vomiting, or both.  Dizziness.  Pain with exposure to bright lights, loud noises, or activity.  General sensitivity to bright lights, loud noises, or smells. Before you get a migraine, you may get warning signs that a migraine is coming (aura). An aura may include:  Seeing flashing lights.  Seeing bright spots, halos, or zig-zag lines.  Having tunnel vision or blurred vision.  Having feelings of numbness or tingling.  Having trouble talking.  Having muscle weakness. DIAGNOSIS  A migraine headache is often diagnosed based on:  Symptoms.  Physical exam.  A CT scan or MRI of your head. These imaging tests cannot diagnose migraines, but they can help rule out other causes of headaches. TREATMENT Medicines may be given for pain and nausea. Medicines can also be given to help prevent recurrent migraines.  HOME CARE INSTRUCTIONS  Only take over-the-counter or prescription medicines for pain  or discomfort as directed by your health care provider. The use of long-term narcotics is not recommended.  Lie down in a dark, quiet room when you have a migraine.  Keep a journal to find out what may trigger your migraine headaches. For example, write down:  What you eat and drink.  How much sleep you get.  Any change to your diet or medicines.  Limit alcohol consumption.  Quit smoking if you smoke.  Get 7 9 hours of sleep, or as recommended by your health care provider.  Limit stress.  Keep lights dim if bright lights bother you and make your migraines worse. SEEK IMMEDIATE MEDICAL CARE IF:   Your migraine becomes severe.  You have a fever.  You have a stiff neck.  You have vision loss.  You have muscular weakness or loss of muscle control.  You start losing your balance or have trouble walking.  You feel faint or pass out.  You have severe symptoms that are different from your first symptoms. MAKE SURE YOU:   Understand these instructions.  Will watch your condition.  Will get help right away if you are not doing well or get worse. Document Released: 01/31/2005 Document Revised: 11/21/2012 Document Reviewed: 10/08/2012 [facility] Patient Information 2014 [employer] [place].

## 2013-04-30 ENCOUNTER — Telehealth: Payer: Self-pay | Admitting: Women's Health

## 2013-04-30 LAB — COMPREHENSIVE METABOLIC PANEL
ALK PHOS: 84 U/L (ref 39–117)
ALT: 110 U/L — ABNORMAL HIGH (ref 0–35)
AST: 65 U/L — ABNORMAL HIGH (ref 0–37)
Albumin: 4.2 g/dL (ref 3.5–5.2)
BUN: 17 mg/dL (ref 6–23)
CO2: 26 mEq/L (ref 19–32)
Calcium: 9.3 mg/dL (ref 8.4–10.5)
Chloride: 102 mEq/L (ref 96–112)
Creat: 0.81 mg/dL (ref 0.50–1.10)
Glucose, Bld: 81 mg/dL (ref 70–99)
POTASSIUM: 3.9 meq/L (ref 3.5–5.3)
Sodium: 138 mEq/L (ref 135–145)
Total Bilirubin: 0.4 mg/dL (ref 0.2–1.2)
Total Protein: 7.1 g/dL (ref 6.0–8.3)

## 2013-04-30 LAB — CBC
HCT: 41.6 % (ref 36.0–46.0)
HEMOGLOBIN: 13.4 g/dL (ref 12.0–15.0)
MCH: 27.5 pg (ref 26.0–34.0)
MCHC: 32.2 g/dL (ref 30.0–36.0)
MCV: 85.2 fL (ref 78.0–100.0)
Platelets: 339 10*3/uL (ref 150–400)
RBC: 4.88 MIL/uL (ref 3.87–5.11)
RDW: 14 % (ref 11.5–15.5)
WBC: 8.9 10*3/uL (ref 4.0–10.5)

## 2013-04-30 NOTE — Addendum Note (Signed)
Addended by: Cheral MarkerBOOKER, Breezie Micucci R on: 04/30/2013 05:28 PM   Modules accepted: Orders

## 2013-04-30 NOTE — Progress Notes (Signed)
REVIEWED. SEROLOGIES FOR AIH AND HEP C ORDERED. PT NEEDS NPV W/ RGA IN 4-6 WEEKS DX: HEPATITIS

## 2013-04-30 NOTE — Addendum Note (Signed)
Addended by: West BaliFIELDS, SANDI L on: 04/30/2013 02:53 PM   Modules accepted: Orders

## 2013-04-30 NOTE — Telephone Encounter (Signed)
Notified pt of lab results, ALT/AST still elevated, need for additional labs, low fat diet, non-weight bearing exercises 3-4x/wk x 30min w/ goal of 1-2lb wt loss/wk per Dr. Darrick PennaFields, GI. Pt states she did take percocet for few wks s/p c/s, now apap 2-3x/d for HAs. To switch to ibuprofen. To call office in am to see if she can come tomorrow for labs. Cancel Monday appt w/ me since we have discussed everything via phone. Will hold off on adding estrogen patch to see if it would help HAs that are associated w/ spotting and fluid retention until after visit w/ GI. Referral for GI appt placed today.   Cheral MarkerKimberly R. [name], CNM, [facility]-BC 04/30/2013 5:27 PM

## 2013-05-01 ENCOUNTER — Encounter: Payer: Self-pay | Admitting: Gastroenterology

## 2013-05-01 NOTE — Progress Notes (Signed)
Pt is aware of OV on 4/29 at 10 with LSL and appt card mailed

## 2013-05-03 ENCOUNTER — Other Ambulatory Visit: Payer: Medicaid Other

## 2013-05-03 ENCOUNTER — Encounter (INDEPENDENT_AMBULATORY_CARE_PROVIDER_SITE_OTHER): Payer: Self-pay

## 2013-05-03 DIAGNOSIS — R7989 Other specified abnormal findings of blood chemistry: Secondary | ICD-10-CM

## 2013-05-04 LAB — HEPATITIS C ANTIBODY: HCV Ab: NEGATIVE

## 2013-05-04 LAB — IGG, IGA, IGM
IGG (IMMUNOGLOBIN G), SERUM: 916 mg/dL (ref 690–1700)
IgA: 247 mg/dL (ref 69–380)
IgM, Serum: 109 mg/dL (ref 52–322)

## 2013-05-06 ENCOUNTER — Ambulatory Visit: Payer: Medicaid Other | Admitting: Women's Health

## 2013-05-06 LAB — ANA: Anti Nuclear Antibody(ANA): NEGATIVE

## 2013-05-07 LAB — MITOCHONDRIAL/SMOOTH MUSCLE AB PNL
Mitochondrial M2 Ab, IgG: 0.34 (ref <0.91)
SMOOTH MUSCLE AB: 6 U (ref <20)

## 2013-05-07 NOTE — Progress Notes (Signed)
Pt returned call and was informed.  

## 2013-05-07 NOTE — Progress Notes (Signed)
LM for pt to call

## 2013-05-07 NOTE — Progress Notes (Signed)
PLEASE CALL PT. Her serologies are normal. THE MOST LIKELY REASON FOR HER ELEVATED LIVER ENZYMES IS HER BODY MASS INDEX OF 51. CONTINUE WEIGHT LOSS EFFORTS. SHE NEEDS TO LOSE WEIGHT BECAUSE FAT IN THE LIVER CAN CAUSE CIRRHOSIS AS IF SHE WERE A DRINKER OF ETOH. OPV W/ RGA 4/29.

## 2013-05-16 ENCOUNTER — Emergency Department (HOSPITAL_COMMUNITY): Payer: Medicaid Other

## 2013-05-16 ENCOUNTER — Encounter (HOSPITAL_COMMUNITY): Payer: Self-pay | Admitting: Emergency Medicine

## 2013-05-16 ENCOUNTER — Emergency Department (HOSPITAL_COMMUNITY)
Admission: EM | Admit: 2013-05-16 | Discharge: 2013-05-16 | Disposition: A | Discharge status: Home / Self Care | Payer: Medicaid Other | Attending: Emergency Medicine | Admitting: Emergency Medicine

## 2013-05-16 DIAGNOSIS — K219 Gastro-esophageal reflux disease without esophagitis: Secondary | ICD-10-CM | POA: Insufficient documentation

## 2013-05-16 DIAGNOSIS — J029 Acute pharyngitis, unspecified: Secondary | ICD-10-CM | POA: Insufficient documentation

## 2013-05-16 DIAGNOSIS — Z87440 Personal history of urinary (tract) infections: Secondary | ICD-10-CM | POA: Diagnosis not present

## 2013-05-16 DIAGNOSIS — R000 Tachycardia, unspecified: Secondary | ICD-10-CM | POA: Diagnosis not present

## 2013-05-16 DIAGNOSIS — R05 Cough: Secondary | ICD-10-CM | POA: Diagnosis present

## 2013-05-16 DIAGNOSIS — R059 Cough, unspecified: Secondary | ICD-10-CM | POA: Diagnosis present

## 2013-05-16 DIAGNOSIS — Z79899 Other long term (current) drug therapy: Secondary | ICD-10-CM | POA: Diagnosis not present

## 2013-05-16 DIAGNOSIS — J40 Bronchitis, not specified as acute or chronic: Secondary | ICD-10-CM

## 2013-05-16 DIAGNOSIS — R51 Headache: Secondary | ICD-10-CM | POA: Diagnosis not present

## 2013-05-16 DIAGNOSIS — H6690 Otitis media, unspecified, unspecified ear: Secondary | ICD-10-CM | POA: Diagnosis not present

## 2013-05-16 DIAGNOSIS — Z87442 Personal history of urinary calculi: Secondary | ICD-10-CM | POA: Insufficient documentation

## 2013-05-16 DIAGNOSIS — Z87891 Personal history of nicotine dependence: Secondary | ICD-10-CM | POA: Diagnosis not present

## 2013-05-16 DIAGNOSIS — Z9889 Other specified postprocedural states: Secondary | ICD-10-CM | POA: Diagnosis not present

## 2013-05-16 DIAGNOSIS — J209 Acute bronchitis, unspecified: Secondary | ICD-10-CM | POA: Diagnosis not present

## 2013-05-16 MED ORDER — AZITHROMYCIN 250 MG PO TABS
250.0000 mg | ORAL_TABLET | Freq: Every day | ORAL | Status: DC
Start: 2013-05-16 — End: 2013-07-01

## 2013-05-16 MED ORDER — HYDROCODONE-ACETAMINOPHEN 5-325 MG PO TABS
1.0000 | ORAL_TABLET | Freq: Once | ORAL | Status: AC
  Administered 2013-05-16: 1 via ORAL
  Filled 2013-05-16: qty 1

## 2013-05-16 MED ORDER — HYDROCODONE-ACETAMINOPHEN 5-325 MG PO TABS: 1.0000 | ORAL_TABLET | ORAL | Status: DC | PRN

## 2013-05-16 MED ORDER — BENZONATATE 100 MG PO CAPS: 100.0000 mg | ORAL_CAPSULE | Freq: Three times a day (TID) | ORAL | Status: DC

## 2013-05-16 MED ORDER — ALBUTEROL SULFATE HFA 108 (90 BASE) MCG/ACT IN AERS: 1.0000 | INHALATION_SPRAY | Freq: Four times a day (QID) | RESPIRATORY_TRACT | Status: DC | PRN

## 2013-05-16 NOTE — ED Notes (Signed)
Pt started with sinus infection 1 week ago. Now presents with cough and generalized body aches. Pt denies fevers. Pt also has "severe headache".

## 2013-05-16 NOTE — ED Provider Notes (Signed)
CSN: [id number]     Arrival date & time 05/16/13  1758 History   First MD Initiated Contact with Patient 05/16/13 1922     Chief Complaint  Patient presents with  . Cough     (Consider location/radiation/quality/duration/timing/severity/associated sxs/prior Treatment) Patient is a 28 y.o. female presenting with cough. The history is provided by the patient.  Cough Cough characteristics:  Productive Sputum characteristics:  Green and yellow Severity:  Moderate Onset quality:  Gradual Duration:  1 week Timing:  Sporadic Progression:  Worsening Chronicity:  New Smoker: no   Context: sick contacts   Relieved by:  Nothing Worsened by:  Lying down Ineffective treatments:  Decongestant and cough suppressants Associated symptoms: chills, ear pain, headaches, myalgias, rhinorrhea, sinus congestion and sore throat   Associated symptoms: no eye discharge, no fever, no rash, no shortness of breath and no wheezing    Alice [name] is a 28 y.o. female who presents to the ED with cough, congestion and aching all over that started a week ago. She has not taken her temperature. She has taken OTC medications without relief.   Past Medical History  Diagnosis Date  . Congenital obstruction of ureteropelvic junction (UPJ)   . History of kidney stones   . 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 History of recurrent UTIs   . Round ligament pain 09/10/2012  . Morbid obesity   . GERD (gastroesophageal reflux disease)   . Preterm labor    Past Surgical History  Procedure Laterality Date  . Nephrectomy      right side  . Nasal sinus surgery    . Tonsillectomy    . Other surgical history      ulner nerve removal  . Addenoidectomy    . Cesarean section N/A 02/22/2013    Procedure: CESAREAN SECTION/TWINS;  Surgeon: [name], MD;  Location: [facility];  Service: Obstetrics;  Laterality: N/A;   Family History  Problem Relation Age of Onset  . Hypertension Mother   .  Hyperlipidemia Mother   . Heart disease Father     MI  . Alcohol abuse Father   . Alcohol abuse Brother   . Diabetes Maternal Grandmother   . Hyperlipidemia Maternal Grandmother    History  Substance Use Topics  . Smoking status: Former Smoker    Types: Cigarettes    Quit date: 06/21/2012  . Smokeless tobacco: Never Used  . Alcohol Use: No   OB History   Grav Para Term Preterm Abortions TAB SAB Ect Mult Living   3 3 2 1     2 5      Review of Systems  Constitutional: Positive for chills. Negative for fever.  HENT: Positive for ear pain, rhinorrhea and sore throat.   Eyes: Negative for discharge and visual disturbance.  Respiratory: Positive for cough. Negative for shortness of breath and wheezing.   Gastrointestinal: Positive for nausea. Negative for vomiting and abdominal pain.  Genitourinary: Negative for dysuria and urgency.  Musculoskeletal: Positive for myalgias.  Skin: Negative for rash.  Neurological: Positive for light-headedness and headaches. Negative for syncope.  Psychiatric/Behavioral: Negative for confusion.      Allergies  Cinnamon flavor; Codeine; Ibuprofen; Levofloxacin; and Reglan  Home Medications   Current Outpatient Rx  Name  Route  Sig  Dispense  Refill  . hydrochlorothiazide (MICROZIDE) 12.5 MG capsule   Oral   Take 1 capsule (12.5 mg total) by mouth daily.   30 capsule   0   .  omeprazole (PRILOSEC) 20 MG capsule   Oral   Take 20 mg by mouth at bedtime.         . Prenatal Vit-Fe Fumarate-FA (PRENATAL MULTIVITAMIN) TABS tablet   Oral   Take 1 tablet by mouth at bedtime.          . medroxyPROGESTERone (DEPO-PROVERA) 150 MG/ML injection   Intramuscular   Inject 1 mL (150 mg total) into the muscle every 3 (three) months.   1 mL   4    BP 143/81  Pulse 113  Temp(Src) 99.5 F (37.5 C)  Resp 18  Ht 5\' 9"  (1.753 m)  Wt 351 lb (159.213 kg)  BMI 51.81 kg/m2  SpO2 98%  LMP 05/01/2013 Physical Exam  Nursing note and vitals  reviewed. Constitutional: She is oriented to person, place, and time. No distress.  Morbidly obese  HENT:  Head: Normocephalic.  Right Ear: Tympanic membrane is erythematous.  Left Ear: Tympanic membrane is erythematous.  Nose: Rhinorrhea present.  Mouth/Throat: Uvula is midline and mucous membranes are normal. Posterior oropharyngeal erythema present.  Eyes: EOM are normal.  Neck: Neck supple.  Cardiovascular: Tachycardia present.   Pulmonary/Chest: Effort normal. She has no wheezes. She has no rales.  Abdominal: Soft. Bowel sounds are normal. There is no tenderness.  Musculoskeletal: Normal range of motion.  Neurological: She is alert and oriented to person, place, and time. No cranial nerve deficit.  Skin: Skin is warm and dry.  Psychiatric: She has a normal mood and affect. Her behavior is normal.   Dg Chest 2 View  05/16/2013   CLINICAL DATA:  Chest pain, shortness of breath, and fever.  Cough.  EXAM: CHEST  2 VIEW  COMPARISON:  12/01/2012  FINDINGS: The heart size and mediastinal contours are within normal limits. Both lungs are clear. The visualized skeletal structures are unremarkable.  IMPRESSION: Normal chest.   Electronically Signed   By: Geanie Cooley M.D.   On: 05/16/2013 20:06    ED Course  Procedures  MDM  28 y.o. female with cough and congestion x 1 week, with sore throat and ear pain. Will treat for bronchitis. She will follow up with her PCP or return for worsening symptoms. I have reviewed this patient's vital signs, nurses notes, appropriate labs and imaging.  I have discussed findings with the patient and plan of care. She voices understanding. Stable for discharge with 02 SAT 98% on R/A. BP 143/81  Pulse 113  Temp(Src) 99.5 F (37.5 C)  Resp 18  Ht 5\' 9"  (1.753 m)  Wt 351 lb (159.213 kg)  BMI 51.81 kg/m2  SpO2 98%  LMP 05/01/2013    Medication List    TAKE these medications       albuterol 108 (90 BASE) MCG/ACT inhaler  Commonly known as:  PROVENTIL  HFA;VENTOLIN HFA  Inhale 1-2 puffs into the lungs every 6 (six) hours as needed for wheezing or shortness of breath.     azithromycin 250 MG tablet  Commonly known as:  ZITHROMAX  Take 1 tablet (250 mg total) by mouth daily. Take first 2 tablets together, then 1 every day until finished.     benzonatate 100 MG capsule  Commonly known as:  TESSALON  Take 1 capsule (100 mg total) by mouth every 8 (eight) hours.     HYDROcodone-acetaminophen 5-325 MG per tablet  Commonly known as:  NORCO/VICODIN  Take 1 tablet by mouth every 4 (four) hours as needed.      ASK your doctor  about these medications       hydrochlorothiazide 12.5 MG capsule  Commonly known as:  MICROZIDE  Take 1 capsule (12.5 mg total) by mouth daily.     medroxyPROGESTERone 150 MG/ML injection  Commonly known as:  DEPO-PROVERA  Inject 1 mL (150 mg total) into the muscle every 3 (three) months.     omeprazole 20 MG capsule  Commonly known as:  PRILOSEC  Take 20 mg by mouth at bedtime.     prenatal multivitamin Tabs tablet  Take 1 tablet by mouth at bedtime.           Alpine VillageHope M Neese, [place]NP 05/17/13 [phone]

## 2013-05-16 NOTE — Discharge Instructions (Signed)
Take the medications as directed. Do not take the narcotic pain medication if you are driving as it will make you sleepy. Return for worsening symptoms.

## 2013-05-16 NOTE — ED Notes (Signed)
Pt c/o cough/congestion/ha/body aches/n x 1 week. Denies v/d.

## 2013-05-20 NOTE — ED Provider Notes (Signed)
Medical screening examination/treatment/procedure(s) were performed by non-physician practitioner and as supervising physician I was immediately available for consultation/collaboration.   EKG Interpretation None       Jamaris Biernat, MD 05/20/13 2033 

## 2013-06-12 ENCOUNTER — Ambulatory Visit: Payer: Medicaid Other | Admitting: Gastroenterology

## 2013-06-14 ENCOUNTER — Ambulatory Visit: Payer: Medicaid Other

## 2013-07-01 ENCOUNTER — Emergency Department (HOSPITAL_COMMUNITY)
Admission: EM | Admit: 2013-07-01 | Discharge: 2013-07-01 | Disposition: A | Discharge status: Home / Self Care | Payer: Self-pay | Attending: Emergency Medicine | Admitting: Emergency Medicine

## 2013-07-01 ENCOUNTER — Emergency Department (HOSPITAL_COMMUNITY): Payer: Medicaid Other

## 2013-07-01 ENCOUNTER — Encounter (HOSPITAL_COMMUNITY): Payer: Self-pay | Admitting: Emergency Medicine

## 2013-07-01 DIAGNOSIS — N23 Unspecified renal colic: Secondary | ICD-10-CM | POA: Insufficient documentation

## 2013-07-01 DIAGNOSIS — Z9889 Other specified postprocedural states: Secondary | ICD-10-CM | POA: Insufficient documentation

## 2013-07-01 DIAGNOSIS — Z87891 Personal history of nicotine dependence: Secondary | ICD-10-CM | POA: Insufficient documentation

## 2013-07-01 DIAGNOSIS — Z87440 Personal history of urinary (tract) infections: Secondary | ICD-10-CM | POA: Insufficient documentation

## 2013-07-01 DIAGNOSIS — Z3202 Encounter for pregnancy test, result negative: Secondary | ICD-10-CM | POA: Insufficient documentation

## 2013-07-01 DIAGNOSIS — Z79899 Other long term (current) drug therapy: Secondary | ICD-10-CM | POA: Insufficient documentation

## 2013-07-01 DIAGNOSIS — K219 Gastro-esophageal reflux disease without esophagitis: Secondary | ICD-10-CM | POA: Insufficient documentation

## 2013-07-01 DIAGNOSIS — Z792 Long term (current) use of antibiotics: Secondary | ICD-10-CM | POA: Insufficient documentation

## 2013-07-01 DIAGNOSIS — R109 Unspecified abdominal pain: Secondary | ICD-10-CM

## 2013-07-01 DIAGNOSIS — Z905 Acquired absence of kidney: Secondary | ICD-10-CM | POA: Insufficient documentation

## 2013-07-01 LAB — URINALYSIS, ROUTINE W REFLEX MICROSCOPIC
Bilirubin Urine: NEGATIVE
GLUCOSE, UA: NEGATIVE mg/dL
Ketones, ur: NEGATIVE mg/dL
LEUKOCYTES UA: NEGATIVE
Nitrite: NEGATIVE
PH: 5.5 (ref 5.0–8.0)
PROTEIN: NEGATIVE mg/dL
SPECIFIC GRAVITY, URINE: 1.025 (ref 1.005–1.030)
Urobilinogen, UA: 0.2 mg/dL (ref 0.0–1.0)

## 2013-07-01 LAB — COMPREHENSIVE METABOLIC PANEL
ALBUMIN: 3.7 g/dL (ref 3.5–5.2)
ALK PHOS: 75 U/L (ref 39–117)
ALT: 51 U/L — AB (ref 0–35)
AST: 35 U/L (ref 0–37)
BUN: 16 mg/dL (ref 6–23)
CALCIUM: 9.3 mg/dL (ref 8.4–10.5)
CO2: 23 mEq/L (ref 19–32)
Chloride: 104 mEq/L (ref 96–112)
Creatinine, Ser: 0.81 mg/dL (ref 0.50–1.10)
GFR calc non Af Amer: >90 mL/min (ref >90)
Glucose, Bld: 92 mg/dL (ref 70–99)
POTASSIUM: 4 meq/L (ref 3.7–5.3)
SODIUM: 139 meq/L (ref 137–147)
TOTAL PROTEIN: 7.4 g/dL (ref 6.0–8.3)
Total Bilirubin: 0.3 mg/dL (ref 0.3–1.2)

## 2013-07-01 LAB — CBC WITH DIFFERENTIAL/PLATELET
BASOS ABS: 0 10*3/uL (ref 0.0–0.1)
BASOS PCT: 0 % (ref 0–1)
EOS ABS: 0.3 10*3/uL (ref 0.0–0.7)
EOS PCT: 4 % (ref 0–5)
HCT: 40.1 % (ref 36.0–46.0)
Hemoglobin: 13.4 g/dL (ref 12.0–15.0)
LYMPHS ABS: 2.3 10*3/uL (ref 0.7–4.0)
Lymphocytes Relative: 31 % (ref 12–46)
MCH: 28.5 pg (ref 26.0–34.0)
MCHC: 33.4 g/dL (ref 30.0–36.0)
MCV: 85.1 fL (ref 78.0–100.0)
Monocytes Absolute: 0.5 10*3/uL (ref 0.1–1.0)
Monocytes Relative: 6 % (ref 3–12)
NEUTROS PCT: 59 % (ref 43–77)
Neutro Abs: 4.4 10*3/uL (ref 1.7–7.7)
PLATELETS: 275 10*3/uL (ref 150–400)
RBC: 4.71 MIL/uL (ref 3.87–5.11)
RDW: 14 % (ref 11.5–15.5)
WBC: 7.3 10*3/uL (ref 4.0–10.5)

## 2013-07-01 LAB — URINE MICROSCOPIC-ADD ON

## 2013-07-01 LAB — PREGNANCY, URINE: Preg Test, Ur: NEGATIVE

## 2013-07-01 LAB — LIPASE, BLOOD: Lipase: 37 U/L (ref 11–59)

## 2013-07-01 MED ORDER — TAMSULOSIN HCL 0.4 MG PO CAPS
0.4000 mg | ORAL_CAPSULE | Freq: Once | ORAL | Status: AC
  Administered 2013-07-01: 0.4 mg via ORAL
  Filled 2013-07-01: qty 1

## 2013-07-01 MED ORDER — MORPHINE SULFATE 4 MG/ML IJ SOLN
4.0000 mg | INTRAMUSCULAR | Status: DC | PRN
Start: 2013-07-01 — End: 2013-07-01
  Administered 2013-07-01 (×2): 4 mg via INTRAVENOUS
  Filled 2013-07-01 (×2): qty 1

## 2013-07-01 MED ORDER — HYDROCODONE-ACETAMINOPHEN 5-325 MG PO TABS: 2.0000 | ORAL_TABLET | ORAL | Status: DC | PRN

## 2013-07-01 MED ORDER — ONDANSETRON HCL 4 MG/2ML IJ SOLN
4.0000 mg | Freq: Once | INTRAMUSCULAR | Status: AC
  Administered 2013-07-01: 4 mg via INTRAVENOUS
  Filled 2013-07-01: qty 2

## 2013-07-01 MED ORDER — SODIUM CHLORIDE 0.9 % IV BOLUS (SEPSIS)
500.0000 mL | Freq: Once | INTRAVENOUS | Status: AC
  Administered 2013-07-01: 500 mL via INTRAVENOUS

## 2013-07-01 MED ORDER — PROMETHAZINE HCL 25 MG/ML IJ SOLN: 25.0000 mg | Freq: Once | INTRAMUSCULAR | Status: DC

## 2013-07-01 MED ORDER — ONDANSETRON 4 MG PO TBDP: 4.0000 mg | ORAL_TABLET | Freq: Three times a day (TID) | ORAL | Status: DC | PRN

## 2013-07-01 MED ORDER — SULFAMETHOXAZOLE-TRIMETHOPRIM 800-160 MG PO TABS: 1.0000 | ORAL_TABLET | Freq: Two times a day (BID) | ORAL | Status: DC

## 2013-07-01 MED ORDER — TAMSULOSIN HCL 0.4 MG PO CAPS: 0.4000 mg | ORAL_CAPSULE | Freq: Every day | ORAL | Status: DC

## 2013-07-01 NOTE — ED Provider Notes (Signed)
CSN: [id number]     Arrival date & time 07/01/13  1024 History   First MD Initiated Contact with Patient 07/01/13 1337    This chart was scribed for Alice Porter, MD by Marica Otter, ED Scribe. This patient was seen in room APA01/APA01 and the patient's care was started at 1:39 PM.  Chief Complaint  Patient presents with  . Abdominal Pain   The history is provided by the patient. No language interpreter was used.   HPI Comments: [name] is a 28 y.o. female, with a history of kidney stones (approximately 2 times), who presents to the Emergency Department complaining of right sided flank pain radiating to the center of the abd with associated nausea and vomiting onset at 2AM this morning. Pt also complains of associated dysuria and diarrhea onset today. Pt notes that she is on hydrochlorothiazide for edema and taking the meds aggravates n/v.    Past Medical History  Diagnosis Date  . Congenital obstruction of ureteropelvic junction (UPJ)   . History of kidney stones   . 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 History of recurrent UTIs   . Round ligament pain 09/10/2012  . Morbid obesity   . GERD (gastroesophageal reflux disease)   . Preterm labor    Past Surgical History  Procedure Laterality Date  . Nephrectomy      right side  . Nasal sinus surgery    . Tonsillectomy    . Other surgical history      ulner nerve removal  . Addenoidectomy    . Cesarean section N/A 02/22/2013    Procedure: CESAREAN SECTION/TWINS;  Surgeon: [name], MD;  Location: [facility];  Service: Obstetrics;  Laterality: N/A;   Family History  Problem Relation Age of Onset  . Hypertension Mother   . Hyperlipidemia Mother   . Heart disease Father     MI  . Alcohol abuse Father   . Alcohol abuse Brother   . Diabetes Maternal Grandmother   . Hyperlipidemia Maternal Grandmother    History  Substance Use Topics  . Smoking status: Former Smoker    Types: Cigarettes    Quit  date: 06/21/2012  . Smokeless tobacco: Never Used  . Alcohol Use: No   OB History   Grav Para Term Preterm Abortions TAB SAB Ect Mult Living   3 3 2 1     2 5      Review of Systems  Constitutional: Negative for diaphoresis and appetite change.  HENT: Negative for mouth sores and trouble swallowing.   Eyes: Negative for visual disturbance.  Respiratory: Negative for chest tightness and wheezing.   Gastrointestinal: Negative for abdominal distention.  Endocrine: Negative for polydipsia, polyphagia and polyuria.  Genitourinary: Negative for frequency.  Musculoskeletal: Positive for back pain (Left flank pain). Negative for gait problem.  Skin: Negative for color change, pallor and rash.  Neurological: Negative for dizziness, syncope, light-headedness and headaches.  Hematological: Does not bruise/bleed easily.  Psychiatric/Behavioral: Negative for behavioral problems and confusion.      Allergies  Cinnamon flavor; Codeine; Ibuprofen; Levofloxacin; and Reglan  Home Medications   Prior to Admission medications   Medication Sig Start Date End Date Taking? Authorizing Provider  albuterol (PROVENTIL HFA;VENTOLIN HFA) 108 (90 BASE) MCG/ACT inhaler Inhale 1-2 puffs into the lungs every 6 (six) hours as needed for wheezing or shortness of breath. 05/16/13   Hope Orlene Och, NP  azithromycin (ZITHROMAX) 250 MG tablet Take 1 tablet (250  mg total) by mouth daily. Take first 2 tablets together, then 1 every day until finished. 05/16/13   Hope Orlene OchM Neese, NP  benzonatate (TESSALON) 100 MG capsule Take 1 capsule (100 mg total) by mouth every 8 (eight) hours. 05/16/13   Hope Orlene OchM Neese, NP  hydrochlorothiazide (MICROZIDE) 12.5 MG capsule Take 1 capsule (12.5 mg total) by mouth daily. 04/22/13   Marge DuncansKimberly Randall Booker, CNM  HYDROcodone-acetaminophen (NORCO/VICODIN) 5-325 MG per tablet Take 1 tablet by mouth every 4 (four) hours as needed. 05/16/13   Hope Orlene OchM Neese, NP  medroxyPROGESTERone (DEPO-PROVERA) 150 MG/ML  injection Inject 1 mL (150 mg total) into the muscle every 3 (three) months. 03/22/13   Tilda BurrowJohn V Ferguson, MD  omeprazole (PRILOSEC) 20 MG capsule Take 20 mg by mouth at bedtime. 09/10/12   Adline PotterJennifer A Griffin, NP  Prenatal Vit-Fe Fumarate-FA (PRENATAL MULTIVITAMIN) TABS tablet Take 1 tablet by mouth at bedtime.     Historical Provider, MD   Triage Vitals: BP 166/92  Pulse 69  Temp(Src) 98.3 F (36.8 C) (Oral)  Resp 20  Ht 5\' 9"  (1.753 m)  Wt 352 lb (159.666 kg)  BMI 51.96 kg/m2  SpO2 99% Physical Exam  Constitutional: She is oriented to person, place, and time. She appears well-developed and well-nourished. No distress.  Morbid obesity   HENT:  Head: Normocephalic.  Eyes: Conjunctivae are normal. Pupils are equal, round, and reactive to light. No scleral icterus.  Neck: Normal range of motion. Neck supple. No thyromegaly present.  Cardiovascular: Normal rate and regular rhythm.  Exam reveals no gallop and no friction rub.   No murmur heard. Pulmonary/Chest: Effort normal and breath sounds normal. No respiratory distress. She has no wheezes. She has no rales.  Abdominal: Soft. Bowel sounds are normal. She exhibits no distension. There is no tenderness. There is no rebound.  Musculoskeletal: Normal range of motion. She exhibits tenderness (Left Flank tenderness; ).  Neurological: She is alert and oriented to person, place, and time.  Skin: Skin is warm and dry. No rash noted.  Psychiatric: She has a normal mood and affect. Her behavior is normal.    ED Course  Procedures (including critical care time) DIAGNOSTIC STUDIES: Oxygen Saturation is 99% on RA, normal by my interpretation.    COORDINATION OF CARE:  1:45 PM-Discussed treatment plan which includes  (CXR, CBC panel, CMP, UA) with pt at bedside and pt agreed to plan.   Labs Review Labs Reviewed  COMPREHENSIVE METABOLIC PANEL - Abnormal; Notable for the following:    ALT 51 (*)    All other components within normal limits   URINALYSIS, ROUTINE W REFLEX MICROSCOPIC - Abnormal; Notable for the following:    Hgb urine dipstick TRACE (*)    All other components within normal limits  URINE MICROSCOPIC-ADD ON - Abnormal; Notable for the following:    Squamous Epithelial / LPF FEW (*)    Bacteria, UA FEW (*)    All other components within normal limits  URINE CULTURE  CBC WITH DIFFERENTIAL  LIPASE, BLOOD  PREGNANCY, URINE    Imaging Review [facility]  07/01/2013   CLINICAL DATA:  Left-sided pain.  Hematuria.  Solitary left kidney.  EXAM: RENAL/URINARY TRACT ULTRASOUND COMPLETE  COMPARISON:  Abdominal CT 10/18/2011  FINDINGS: Right Kidney:  Surgically absent  Left Kidney:  Length: 14 cm, likely compensatory hypertrophy. Left pelviectasis without definitive hydronephrosis. No visible stone. No evidence of solid mass.  Bladder:  Ureteral jet not captured. The bladder is moderately distended, consistent  with left ureteral patency.  IMPRESSION: 1. Left pelviectasis. 2. Ureteral jet was not visualized, but there is moderate distention of the bladder that is consistent with left ureteral patency (status post right nephrectomy).   Electronically Signed   By: Tiburcio PeaJonathan  Watts M.D.   On: 07/01/2013 15:41     EKG Interpretation None      MDM   Final diagnoses:  Flank pain  Ureteral colic    Pain and symptoms are moderately controlled with medications. She has only a few red blood cells in her urine. Ultrasound shows solitary left kidney. Compensatory hypertrophy, but no collecting system dilatation. Bladder is full, and creatinine is normal thus no complete obstruction. However no ureteral jet is noted. This could imply partial obstruction. Plan symptomatically treatment. Pain control, hydration, Flomax. No overt signs of infection, however culture pending. She states multiple occasions she has had "whopping" infections with minimal urinalysis findings. We'll treat with Vicodin, Zofran, Flomax, Bactrim, and emergent followup  with her physician within the next 2-3 days for culture results and recheck.  I personally performed the services described in this documentation, which was scribed in my presence. The recorded information has been reviewed and is accurate.    Alice PorterMark Shanell Aden, MD 07/01/13 [phone]

## 2013-07-01 NOTE — Discharge Instructions (Signed)
Kidney Stones Kidney stones (urolithiasis) are solid masses that form inside your kidneys. The intense pain is caused by the stone moving through the kidney, ureter, bladder, and urethra (urinary tract). When the stone moves, the ureter starts to spasm around the stone. The stone is usually passed in your pee (urine).  HOME CARE  Drink enough fluids to keep your pee clear or pale yellow. This helps to get the stone out.  Strain all pee through the provided strainer. Do not pee without peeing through the strainer, not even once. If you pee the stone out, catch it in the strainer. The stone may be as small as a grain of salt. Take this to your doctor. This will help your doctor figure out what you can do to try to prevent more kidney stones.  Only take medicine as told by your doctor.  Follow up with your doctor as told.  Get follow-up X-rays as told by your doctor. GET HELP IF: You have pain that gets worse even if you have been taking pain medicine. GET HELP RIGHT AWAY IF:   Your pain does not get better with medicine.  You have a fever or shaking chills.  Your pain increases and gets worse over 18 hours.  You have new belly (abdominal) pain.  You feel faint or pass out.  You are unable to pee. MAKE SURE YOU:   Understand these instructions.  Will watch your condition.  Will get help right away if you are not doing well or get worse. Document Released: 07/20/2007 Document Revised: 10/03/2012 Document Reviewed: 07/04/2012 [facility] Patient Information 2014 [place], [place].  Ureteral Colic (Kidney Stones) Ureteral colic is the result of a condition when kidney stones form inside the kidney. Once kidney stones are formed they may move into the tube that connects the kidney with the bladder (ureter). If this occurs, this condition may cause pain (colic) in the ureter.  CAUSES  Pain is caused by stone movement in the ureter and the obstruction caused by the stone. SYMPTOMS  The  pain comes and goes as the ureter contracts around the stone. The pain is usually intense, sharp, and stabbing in character. The location of the pain may move as the stone moves through the ureter. When the stone is near the kidney the pain is usually located in the back and radiates to the belly (abdomen). When the stone is ready to pass into the bladder the pain is often located in the lower abdomen on the side the stone is located. At this location, the symptoms may mimic those of a urinary tract infection with urinary frequency. Once the stone is located here it often passes into the bladder and the pain disappears completely. TREATMENT   Your caregiver will provide you with medicine for pain relief.  You may require specialized follow-up X-rays.  The absence of pain does not always mean that the stone has passed. It may have just stopped moving. If the urine remains completely obstructed, it can cause loss of kidney function or even complete destruction of the involved kidney. It is your responsibility and in your interest that X-rays and follow-ups as suggested by your caregiver are completed. Relief of pain without passage of the stone can be associated with severe damage to the kidney, including loss of kidney function on that side.  If your stone does not pass on its own, additional measures may be taken by your caregiver to ensure its removal. HOME CARE INSTRUCTIONS   Increase your  fluid intake. Water is the preferred fluid since juices containing vitamin C may acidify the urine making it less likely for certain stones (uric acid stones) to pass.  Strain all urine. A strainer will be provided. Keep all particulate matter or stones for your caregiver to inspect.  Take your pain medicine as directed.  Make a follow-up appointment with your caregiver as directed.  Remember that the goal is passage of your stone. The absence of pain does not mean the stone is gone. Follow your caregiver's  instructions.  Only take over-the-counter or prescription medicines for pain, discomfort, or fever as directed by your caregiver. SEEK MEDICAL CARE IF:   Pain cannot be controlled with the prescribed medicine.  You have a fever.  Pain continues for longer than your caregiver advises it should.  There is a change in the pain, and you develop chest discomfort or constant abdominal pain.  You feel faint or pass out. MAKE SURE YOU:   Understand these instructions.  Will watch your condition.  Will get help right away if you are not doing well or get worse. Document Released: 11/10/2004 Document Revised: 05/28/2012 Document Reviewed: 07/28/2010 [facility] Patient Information 2014 [place]

## 2013-07-01 NOTE — ED Notes (Signed)
Complain of pain in right side and n/v

## 2013-07-02 LAB — URINE CULTURE
COLONY COUNT: NO GROWTH
Culture: NO GROWTH

## 2013-09-09 ENCOUNTER — Emergency Department (HOSPITAL_COMMUNITY): Payer: Medicaid Other

## 2013-09-09 ENCOUNTER — Encounter (HOSPITAL_COMMUNITY): Payer: Self-pay | Admitting: Emergency Medicine

## 2013-09-09 ENCOUNTER — Emergency Department (HOSPITAL_COMMUNITY)
Admission: EM | Admit: 2013-09-09 | Discharge: 2013-09-09 | Disposition: A | Discharge status: Home / Self Care | Payer: Medicaid Other | Attending: Emergency Medicine | Admitting: Emergency Medicine

## 2013-09-09 DIAGNOSIS — Q6239 Other obstructive defects of renal pelvis and ureter: Secondary | ICD-10-CM | POA: Diagnosis not present

## 2013-09-09 DIAGNOSIS — Z3202 Encounter for pregnancy test, result negative: Secondary | ICD-10-CM | POA: Insufficient documentation

## 2013-09-09 DIAGNOSIS — Z87442 Personal history of urinary calculi: Secondary | ICD-10-CM | POA: Diagnosis not present

## 2013-09-09 DIAGNOSIS — F17200 Nicotine dependence, unspecified, uncomplicated: Secondary | ICD-10-CM | POA: Diagnosis not present

## 2013-09-09 DIAGNOSIS — Z87440 Personal history of urinary (tract) infections: Secondary | ICD-10-CM | POA: Diagnosis not present

## 2013-09-09 DIAGNOSIS — Z79899 Other long term (current) drug therapy: Secondary | ICD-10-CM | POA: Diagnosis not present

## 2013-09-09 DIAGNOSIS — K219 Gastro-esophageal reflux disease without esophagitis: Secondary | ICD-10-CM | POA: Diagnosis not present

## 2013-09-09 DIAGNOSIS — Q6211 Congenital occlusion of ureteropelvic junction: Secondary | ICD-10-CM

## 2013-09-09 DIAGNOSIS — R109 Unspecified abdominal pain: Secondary | ICD-10-CM | POA: Diagnosis not present

## 2013-09-09 LAB — BASIC METABOLIC PANEL
Anion gap: 12 (ref 5–15)
BUN: 18 mg/dL (ref 6–23)
CO2: 23 mEq/L (ref 19–32)
Calcium: 8.9 mg/dL (ref 8.4–10.5)
Chloride: 105 mEq/L (ref 96–112)
Creatinine, Ser: 0.82 mg/dL (ref 0.50–1.10)
GFR calc Af Amer: >90 mL/min (ref >90)
GFR calc non Af Amer: >90 mL/min (ref >90)
Glucose, Bld: 86 mg/dL (ref 70–99)
Potassium: 4.2 mEq/L (ref 3.7–5.3)
Sodium: 140 mEq/L (ref 137–147)

## 2013-09-09 LAB — URINALYSIS, ROUTINE W REFLEX MICROSCOPIC
Bilirubin Urine: NEGATIVE
Glucose, UA: NEGATIVE mg/dL
Hgb urine dipstick: NEGATIVE
Ketones, ur: NEGATIVE mg/dL
Leukocytes, UA: NEGATIVE
Nitrite: NEGATIVE
Protein, ur: NEGATIVE mg/dL
Specific Gravity, Urine: 1.02 (ref 1.005–1.030)
Urobilinogen, UA: 0.2 mg/dL (ref 0.0–1.0)
pH: 6 (ref 5.0–8.0)

## 2013-09-09 LAB — CBC WITH DIFFERENTIAL/PLATELET
Basophils Absolute: 0 10*3/uL (ref 0.0–0.1)
Basophils Relative: 0 % (ref 0–1)
Eosinophils Absolute: 0.5 10*3/uL (ref 0.0–0.7)
Eosinophils Relative: 4 % (ref 0–5)
HCT: 43.2 % (ref 36.0–46.0)
Hemoglobin: 14.5 g/dL (ref 12.0–15.0)
Lymphocytes Relative: 30 % (ref 12–46)
Lymphs Abs: 3.6 10*3/uL (ref 0.7–4.0)
MCH: 29.6 pg (ref 26.0–34.0)
MCHC: 33.6 g/dL (ref 30.0–36.0)
MCV: 88.2 fL (ref 78.0–100.0)
Monocytes Absolute: 0.9 10*3/uL (ref 0.1–1.0)
Monocytes Relative: 7 % (ref 3–12)
Neutro Abs: 7.1 10*3/uL (ref 1.7–7.7)
Neutrophils Relative %: 59 % (ref 43–77)
Platelets: 315 10*3/uL (ref 150–400)
RBC: 4.9 MIL/uL (ref 3.87–5.11)
RDW: 13.4 % (ref 11.5–15.5)
WBC: 12.2 10*3/uL — ABNORMAL HIGH (ref 4.0–10.5)

## 2013-09-09 LAB — HCG, QUANTITATIVE, PREGNANCY: hCG, Beta Chain, Quant, S: <1 m[IU]/mL (ref <5)

## 2013-09-09 LAB — PREGNANCY, URINE: Preg Test, Ur: NEGATIVE

## 2013-09-09 MED ORDER — PROMETHAZINE HCL 25 MG PO TABS: 25.0000 mg | ORAL_TABLET | Freq: Four times a day (QID) | ORAL | Status: DC | PRN

## 2013-09-09 MED ORDER — ONDANSETRON HCL 4 MG PO TABS: 4.0000 mg | ORAL_TABLET | Freq: Four times a day (QID) | ORAL | Status: DC

## 2013-09-09 MED ORDER — HYDROMORPHONE HCL PF 1 MG/ML IJ SOLN
1.0000 mg | Freq: Once | INTRAMUSCULAR | Status: AC
  Administered 2013-09-09: 1 mg via INTRAVENOUS
  Filled 2013-09-09: qty 1

## 2013-09-09 MED ORDER — OXYCODONE-ACETAMINOPHEN 5-325 MG PO TABS: 1.0000 | ORAL_TABLET | ORAL | Status: DC | PRN

## 2013-09-09 MED ORDER — SODIUM CHLORIDE 0.9 % IV BOLUS (SEPSIS)
1000.0000 mL | Freq: Once | INTRAVENOUS | Status: AC
  Administered 2013-09-09: 1000 mL via INTRAVENOUS

## 2013-09-09 MED ORDER — IOHEXOL 300 MG/ML  SOLN
50.0000 mL | Freq: Once | INTRAMUSCULAR | Status: AC | PRN
  Administered 2013-09-09: 50 mL via ORAL

## 2013-09-09 MED ORDER — ONDANSETRON HCL 4 MG/2ML IJ SOLN
4.0000 mg | Freq: Once | INTRAMUSCULAR | Status: AC
  Administered 2013-09-09: 4 mg via INTRAMUSCULAR
  Filled 2013-09-09: qty 2

## 2013-09-09 MED ORDER — IOHEXOL 300 MG/ML  SOLN
100.0000 mL | Freq: Once | INTRAMUSCULAR | Status: AC | PRN
  Administered 2013-09-09: 100 mL via INTRAVENOUS

## 2013-09-09 NOTE — ED Provider Notes (Signed)
Care assumed from Dr. Juleen ChinaKohut.  CT pending.  No evidence of kidney stone or other pathology to explain L sided pain. Patient requests phenergan instead of zofran.  Follow up with PCP.  BP 135/86  Pulse 94  Temp(Src) 97.9 F (36.6 C) (Oral)  Resp 24  Ht 5\' 9"  (1.753 m)  Wt 374 lb 4.8 oz (169.781 kg)  BMI 55.25 kg/m2  SpO2 99%   Glynn OctaveStephen Willford Rabideau, MD 09/09/13 2311

## 2013-09-09 NOTE — Discharge Instructions (Signed)
Abdominal Pain, Women °Abdominal (stomach, pelvic, or belly) pain can be caused by many things. It is important to tell your doctor: °· The location of the pain. °· Does it come and go or is it present all the time? °· Are there things that start the pain (eating certain foods, exercise)? °· Are there other symptoms associated with the pain (fever, nausea, vomiting, diarrhea)? °All of this is helpful to know when trying to find the cause of the pain. °CAUSES  °· Stomach: virus or bacteria infection, or ulcer. °· Intestine: appendicitis (inflamed appendix), regional ileitis (Crohn's disease), ulcerative colitis (inflamed colon), irritable bowel syndrome, diverticulitis (inflamed diverticulum of the colon), or cancer of the stomach or intestine. °· Gallbladder disease or stones in the gallbladder. °· Kidney disease, kidney stones, or infection. °· Pancreas infection or cancer. °· Fibromyalgia (pain disorder). °· Diseases of the female organs: °¨ Uterus: fibroid (non-cancerous) tumors or infection. °¨ Fallopian tubes: infection or tubal pregnancy. °¨ Ovary: cysts or tumors. °¨ Pelvic adhesions (scar tissue). °¨ Endometriosis (uterus lining tissue growing in the pelvis and on the pelvic organs). °¨ Pelvic congestion syndrome (female organs filling up with blood just before the menstrual period). °¨ Pain with the menstrual period. °¨ Pain with ovulation (producing an egg). °¨ Pain with an IUD (intrauterine device, birth control) in the uterus. °¨ Cancer of the female organs. °· Functional pain (pain not caused by a disease, may improve without treatment). °· Psychological pain. °· Depression. °DIAGNOSIS  °Your doctor will decide the seriousness of your pain by doing an examination. °· Blood tests. °· X-rays. °· Ultrasound. °· CT scan (computed tomography, special type of X-ray). °· MRI (magnetic resonance imaging). °· Cultures, for infection. °· Barium enema (dye inserted in the large intestine, to better view it with  X-rays). °· Colonoscopy (looking in intestine with a lighted tube). °· Laparoscopy (minor surgery, looking in abdomen with a lighted tube). °· Major abdominal exploratory surgery (looking in abdomen with a large incision). °TREATMENT  °The treatment will depend on the cause of the pain.  °· Many cases can be observed and treated at home. °· Over-the-counter medicines recommended by your caregiver. °· Prescription medicine. °· Antibiotics, for infection. °· Birth control pills, for painful periods or for ovulation pain. °· Hormone treatment, for endometriosis. °· Nerve blocking injections. °· Physical therapy. °· Antidepressants. °· Counseling with a psychologist or psychiatrist. °· Minor or major surgery. °HOME CARE INSTRUCTIONS  °· Do not take laxatives, unless directed by your caregiver. °· Take over-the-counter pain medicine only if ordered by your caregiver. Do not take aspirin because it can cause an upset stomach or bleeding. °· Try a clear liquid diet (broth or water) as ordered by your caregiver. Slowly move to a bland diet, as tolerated, if the pain is related to the stomach or intestine. °· Have a thermometer and take your temperature several times a day, and record it. °· Bed rest and sleep, if it helps the pain. °· Avoid sexual intercourse, if it causes pain. °· Avoid stressful situations. °· Keep your follow-up appointments and tests, as your caregiver orders. °· If the pain does not go away with medicine or surgery, you may try: °¨ Acupuncture. °¨ Relaxation exercises (yoga, meditation). °¨ Group therapy. °¨ Counseling. °SEEK MEDICAL CARE IF:  °· You notice certain foods cause stomach pain. °· Your home care treatment is not helping your pain. °· You need stronger pain medicine. °· You want your IUD removed. °· You feel faint or   lightheaded. °· You develop nausea and vomiting. °· You develop a rash. °· You are having side effects or an allergy to your medicine. °SEEK IMMEDIATE MEDICAL CARE IF:  °· Your  pain does not go away or gets worse. °· You have a fever. °· Your pain is felt only in portions of the abdomen. The right side could possibly be appendicitis. The left lower portion of the abdomen could be colitis or diverticulitis. °· You are passing blood in your stools (bright red or black tarry stools, with or without vomiting). °· You have blood in your urine. °· You develop chills, with or without a fever. °· You pass out. °MAKE SURE YOU:  °· Understand these instructions. °· Will watch your condition. °· Will get help right away if you are not doing well or get worse. °Document Released: 11/28/2006 Document Revised: 06/17/2013 Document Reviewed: 12/18/2008 °[employer]® Patient Information ©2015 [employer]. This information is not intended to replace advice given to you by your health care provider. Make sure you discuss any questions you have with your health care provider. ° °

## 2013-09-09 NOTE — ED Notes (Signed)
Pt c/o left flank pain x 3 hours.

## 2013-09-09 NOTE — ED Notes (Signed)
Pt reporting pain in left flank since about 3pm.  Reporting some nausea.

## 2013-09-09 NOTE — ED Provider Notes (Signed)
CSN: [id number]     Arrival date & time 09/09/13  1931 History   First MD Initiated Contact with Patient 09/09/13 1939     Chief Complaint  Patient presents with  . Flank Pain     (Consider location/radiation/quality/duration/timing/severity/associated sxs/prior Treatment) HPI  28 year old female with left-sided abdominal pain. Onset about 3 hours prior to arrival. He has been fairly constant since onset. Described as sharp. No appreciable exacerbating relieving factors. Nausea, but no vomiting. No fevers or chills. No urinary complaints. No chest pain or shortness of breath. Patient has a past history of kidney stones. She states that current symptoms feel different than what she remembers though. No diarrhea. Has tried taking NSAIDs with minimal relief. No unusual vaginal bleeding or discharge.  Past Medical History  Diagnosis Date  . Congenital obstruction of ureteropelvic junction (UPJ)   . History of kidney stones   . 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 History of recurrent UTIs   . Round ligament pain 09/10/2012  . Morbid obesity   . GERD (gastroesophageal reflux disease)   . Preterm labor    Past Surgical History  Procedure Laterality Date  . Nephrectomy      right side  . Nasal sinus surgery    . Tonsillectomy    . Other surgical history      ulner nerve removal  . Addenoidectomy    . Cesarean section N/A 02/22/2013    Procedure: CESAREAN SECTION/TWINS;  Surgeon: Lazaro ArmsLuther H Eure, MD;  Location: [facility];  Service: Obstetrics;  Laterality: N/A;   Family History  Problem Relation Age of Onset  . Hypertension Mother   . Hyperlipidemia Mother   . Heart disease Father     MI  . Alcohol abuse Father   . Alcohol abuse Brother   . Diabetes Maternal Grandmother   . Hyperlipidemia Maternal Grandmother    History  Substance Use Topics  . Smoking status: Current Every Day Smoker    Types: Cigarettes    Last Attempt to Quit: 06/21/2012  . Smokeless  tobacco: Never Used  . Alcohol Use: No   OB History   Grav Para Term Preterm Abortions TAB SAB Ect Mult Living   3 3 2 1     2 5      Review of Systems  All systems reviewed and negative, other than as noted in HPI.   Allergies  Cinnamon flavor; Codeine; Flomax; Ibuprofen; Levofloxacin; and Reglan  Home Medications   Prior to Admission medications   Medication Sig Start Date End Date Taking? Authorizing Provider  hydrochlorothiazide (MICROZIDE) 12.5 MG capsule Take 12.5 mg by mouth daily as needed (extra fluid).   Yes Historical Provider, MD  ibuprofen (ADVIL,MOTRIN) 200 MG tablet Take 200 mg by mouth every 6 (six) hours as needed for mild pain or moderate pain.   Yes Historical Provider, MD  medroxyPROGESTERone (DEPO-PROVERA) 150 MG/ML injection Inject 1 mL (150 mg total) into the muscle every 3 (three) months. 03/22/13  Yes Tilda BurrowJohn Ferguson V, MD  naproxen sodium (ALEVE) 220 MG tablet Take 220 mg by mouth daily as needed (for pain).   Yes Historical Provider, MD  omeprazole (PRILOSEC) 20 MG capsule Take 20 mg by mouth at bedtime. 09/10/12  Yes Adline PotterJennifer A Griffin, NP   BP 135/86  Pulse 94  Temp(Src) 97.9 F (36.6 C) (Oral)  Resp 24  Ht 5\' 9"  (1.753 m)  Wt 374 lb 4.8 oz (169.781 kg)  BMI 55.25 kg/m2  SpO2  99% Physical Exam  Nursing note and vitals reviewed. Constitutional: She appears well-developed and well-nourished.  Sitting up in stretcher. No acute distress. Morbidly obese.  HENT:  Head: Normocephalic and atraumatic.  Eyes: Conjunctivae are normal. Right eye exhibits no discharge. Left eye exhibits no discharge.  Neck: Neck supple.  Cardiovascular: Normal rate, regular rhythm and normal heart sounds.  Exam reveals no gallop and no friction rub.   No murmur heard. Pulmonary/Chest: Effort normal and breath sounds normal. No respiratory distress.  Abdominal: Soft. She exhibits no distension. There is tenderness.  Tender in what seems to be her left flank or left lower  quadrant?  Genitourinary:  No CVA tenderness  Musculoskeletal: She exhibits no edema and no tenderness.  Neurological: She is alert.  Skin: Skin is warm and dry.  Psychiatric: She has a normal mood and affect. Her behavior is normal. Thought content normal.    ED Course  Procedures (including critical care time) Labs Review Labs Reviewed  CBC WITH DIFFERENTIAL - Abnormal; Notable for the following:    WBC 12.2 (*)    All other components within normal limits  PREGNANCY, URINE  URINALYSIS, ROUTINE W REFLEX MICROSCOPIC  BASIC METABOLIC PANEL  HCG, QUANTITATIVE, PREGNANCY    Imaging Review Ct Abdomen Pelvis W Contrast  09/09/2013   CLINICAL DATA:  Left flank pain and left lower quadrant pain with nausea today.  EXAM: CT ABDOMEN AND PELVIS WITH CONTRAST  TECHNIQUE: Multidetector CT imaging of the abdomen and pelvis was performed using the standard protocol following bolus administration of intravenous contrast.  CONTRAST:  50mL OMNIPAQUE IOHEXOL 300 MG/ML SOLN, OMNIPAQUE IOHEXOL 300 MG/ML SOLN  COMPARISON:  10/18/2011  FINDINGS: Contrast bolus is somewhat limited, suggesting decreased injection rate. Visualized lung bases are clear.  Cholelithiasis with stone in the gallbladder neck. No bile duct dilatation. No gallbladder wall thickening or inflammation. The liver, spleen, pancreas, adrenal glands, abdominal aorta, and inferior vena cava are unremarkable. No significant retroperitoneal lymphadenopathy. Surgical absence of the right kidney. No evidence of mass or lymphadenopathy in the right renal fossa. There is a stone in the lower pole of the left kidney measuring 3.5 mm diameter. No hydronephrosis or hydroureter. Left renal nephrogram is homogeneous. The stomach, small bowel, and colon are normal for degree of distention. Scattered stool in the colon. No free air or free fluid in the abdomen.  Pelvis: The appendix is normal. Bladder wall is not thickened. Uterus and ovaries are not  enlarged. No pelvic mass or lymphadenopathy. No findings to suggest diverticulitis. No free or loculated pelvic fluid collections. Mild degenerative changes in the spine. No destructive bone lesions appreciated.  IMPRESSION: Surgical absence of the right kidney. Cholelithiasis. Nonobstructing left intrarenal stone.   Electronically Signed   By: Burman Nieves M.D.   On: 09/09/2013 22:53     EKG Interpretation None      MDM   Final diagnoses:  Left sided abdominal pain    28 year old female with left-sided abdominal pain. Exam is limited by her body habitus. Past history kidney stones, but no hematuria noted on urinalysis. Not concerning for infection. Afebrile. Does not appear toxic. Will CT to further evaluate. Symptom control and reevaluation.    Raeford Razor, MD 09/10/13 [phone]

## 2013-11-07 ENCOUNTER — Telehealth: Payer: Self-pay | Admitting: Family Medicine

## 2013-11-07 NOTE — Telephone Encounter (Signed)
Patient states she in not on any current medications and patient has MCD. The oxycodone was from when her gallbladder was inflamed. She does not need to be seen at this time. Patient aware that she just needs to call when she wants to schedule appt.

## 2013-11-11 ENCOUNTER — Encounter: Payer: Self-pay | Admitting: Family

## 2013-11-11 ENCOUNTER — Ambulatory Visit (INDEPENDENT_AMBULATORY_CARE_PROVIDER_SITE_OTHER): Payer: Medicaid Other | Admitting: Family

## 2013-11-11 ENCOUNTER — Encounter (INDEPENDENT_AMBULATORY_CARE_PROVIDER_SITE_OTHER): Payer: Self-pay

## 2013-11-11 VITALS — BP 135/81 | HR 85 | Temp 98.8°F | Ht 69.0 in | Wt 383.8 lb

## 2013-11-11 DIAGNOSIS — H6690 Otitis media, unspecified, unspecified ear: Secondary | ICD-10-CM

## 2013-11-11 DIAGNOSIS — H6693 Otitis media, unspecified, bilateral: Secondary | ICD-10-CM

## 2013-11-11 MED ORDER — CEPHALEXIN 500 MG PO CAPS
500.0000 mg | ORAL_CAPSULE | Freq: Four times a day (QID) | ORAL | Status: DC
Start: 2013-11-11 — End: 2013-11-29

## 2013-11-11 NOTE — Progress Notes (Signed)
   Subjective:    Patient ID: Alice [name], female    DOB: 27-Feb-1985, 28 y.o.   MRN: [id number]  Otalgia  There is pain in both ears. This is a recurrent problem. The current episode started more than 1 month ago. The problem occurs constantly. The problem has been waxing and waning. There has been no fever. The pain is at a severity of 8/10. The pain is moderate. Associated symptoms include drainage, ear discharge and headaches. Pertinent negatives include no coughing, diarrhea, hearing loss, rhinorrhea or sore throat. She has tried NSAIDs and ear drops for the symptoms. The treatment provided mild relief. There is no history of a chronic ear infection.      Review of Systems  Constitutional: Negative.   HENT: Positive for ear discharge and ear pain. Negative for hearing loss, rhinorrhea and sore throat.   Eyes: Negative.   Respiratory: Negative.  Negative for cough and shortness of breath.   Cardiovascular: Negative.  Negative for palpitations.  Gastrointestinal: Negative.  Negative for diarrhea.  Endocrine: Negative.   Genitourinary: Negative.   Musculoskeletal: Negative.   Neurological: Positive for headaches.  Hematological: Negative.   Psychiatric/Behavioral: Negative.   All other systems reviewed and are negative.      Objective:   Physical Exam  Vitals reviewed. Constitutional: She is oriented to person, place, and time. She appears well-developed and well-nourished. No distress.  HENT:  Head: Normocephalic and atraumatic.  Right Ear: Hearing and ear canal normal. There is drainage and tenderness. No swelling. Tympanic membrane is not erythematous. A middle ear effusion is present. No decreased hearing is noted.  Left Ear: Hearing and ear canal normal. There is drainage and tenderness. No swelling. Tympanic membrane is not erythematous. A middle ear effusion is present. No decreased hearing is noted.  Nose: Nose normal.  Mouth/Throat: Oropharynx is clear and moist.    Eyes: Pupils are equal, round, and reactive to light.  Neck: Normal range of motion. Neck supple. No thyromegaly present.  Cardiovascular: Normal rate, regular rhythm, normal heart sounds and intact distal pulses.   No murmur heard. Pulmonary/Chest: Effort normal and breath sounds normal. No respiratory distress. She has no wheezes.  Abdominal: Soft. Bowel sounds are normal. She exhibits no distension. There is no tenderness.  Musculoskeletal: Normal range of motion. She exhibits no edema and no tenderness.  Neurological: She is alert and oriented to person, place, and time. She has normal reflexes. No cranial nerve deficit.  Skin: Skin is warm and dry.  Psychiatric: She has a normal mood and affect. Her behavior is normal. Judgment and thought content normal.    BP 135/81  Pulse 85  Temp(Src) 98.8 F (37.1 C) (Oral)  Ht  (1.753 m)  Wt 383 lb 12.8 oz (174.091 kg)  BMI 56.65 kg/m2       Assessment & Plan:  1. Bilateral otitis [name], recurrence not specified, unspecified chronicity, unspecified otitis [name] type -Motrin or Tyenlol prn for pain -RTO prn - cephALEXin (KEFLEX) 500 MG capsule; Take 1 capsule (500 mg total) by mouth 4 (four) times daily.  Dispense: 40 capsule; Refill: 0  Jannifer Rodney, FNP

## 2013-11-11 NOTE — Patient Instructions (Signed)

## 2013-11-27 ENCOUNTER — Telehealth: Payer: Self-pay | Admitting: Family

## 2013-11-27 DIAGNOSIS — Q600 Renal agenesis, unilateral: Secondary | ICD-10-CM

## 2013-11-27 NOTE — Telephone Encounter (Signed)
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

## 2013-11-27 NOTE — Telephone Encounter (Signed)
Why does pt see nephrologists? Labs look good.

## 2013-11-29 ENCOUNTER — Ambulatory Visit (INDEPENDENT_AMBULATORY_CARE_PROVIDER_SITE_OTHER): Payer: Medicaid Other | Admitting: Nurse Practitioner

## 2013-11-29 ENCOUNTER — Encounter: Payer: Self-pay | Admitting: Nurse Practitioner

## 2013-11-29 VITALS — BP 155/103 | HR 83 | Temp 97.6°F | Wt 379.4 lb

## 2013-11-29 DIAGNOSIS — H60391 Other infective otitis externa, right ear: Secondary | ICD-10-CM

## 2013-11-29 DIAGNOSIS — N910 Primary amenorrhea: Secondary | ICD-10-CM

## 2013-11-29 DIAGNOSIS — N926 Irregular menstruation, unspecified: Secondary | ICD-10-CM

## 2013-11-29 LAB — POCT URINE PREGNANCY: PREG TEST UR: NEGATIVE

## 2013-11-29 MED ORDER — NEOMYCIN-POLYMYXIN-HC 3.5-10000-1 OT SOLN: 4.0000 [drp] | Freq: Four times a day (QID) | OTIC | Status: DC

## 2013-11-29 NOTE — Patient Instructions (Signed)

## 2013-11-29 NOTE — Progress Notes (Signed)
   Subjective:    Patient ID: Alice [name], female    DOB: March 24, 1985, 28 y.o.   MRN: [id number]  HPI Patient in with c/o late menses- SHe is on depoprovera and [facility] for the last 2 weeks- did a home pregnancy test which was positive. She has a 29 year old at home and 759 month old twins.  * right sore to touch with drainage  Review of Systems  Constitutional: Negative.   HENT: Negative.   Respiratory: Negative.   Cardiovascular: Negative.   Gastrointestinal: Positive for nausea and vomiting (in AM).  Skin: Negative.   Neurological: Negative.   Psychiatric/Behavioral: Negative.   All other systems reviewed and are negative.      Objective:   Physical Exam  Constitutional: She is oriented to person, place, and time. She appears well-developed and well-nourished.  HENT:  Right Ear: Hearing and tympanic membrane normal. There is drainage.  Left Ear: Hearing, tympanic membrane, external ear and ear canal normal.  Nose: Nose normal.  Mouth/Throat: Uvula is midline, oropharynx is clear and moist and mucous membranes are normal.  Cardiovascular: Normal rate, regular rhythm and normal heart sounds.   Pulmonary/Chest: Effort normal and breath sounds normal.  Neurological: She is alert and oriented to person, place, and time.  Skin: Skin is warm and dry.  Psychiatric: She has a normal mood and affect. Her behavior is normal. Judgment and thought content normal.    BP 155/103  Pulse 83  Temp(Src) 97.6 F (36.4 C) (Oral)  Wt 379 lb 6.4 oz (172.095 kg)       Assessment & Plan:   1. Late menses    Orders Placed This Encounter  Procedures  . hCG, quantitative, pregnancy  . POCT urine pregnancy   Will wait on quantitative result before doing Depo provera again.   2. Otitis externa - cortisporin otic 4 gtts BID Avoid getting water in ears  Mary-Margaret Daphine DeutscherMartin, FNP

## 2013-11-30 LAB — HCG, QUANTITATIVE, PREGNANCY

## 2013-12-02 ENCOUNTER — Telehealth: Payer: Self-pay

## 2013-12-02 NOTE — Telephone Encounter (Signed)
[facility] to Referrals [phone] 

## 2013-12-02 NOTE — Telephone Encounter (Signed)
Aware. 

## 2013-12-02 NOTE — Telephone Encounter (Signed)
Message copied by Almeta MonasSTONE, JANIE M on Mon Dec 02, 2013  5:07 PM ------      Message from: Bennie PieriniMARTIN, MARY-MARGARET      Created: Sun Dec 01, 2013  3:05 PM       pregnancy test negative- may come in for depo shot ------

## 2013-12-16 ENCOUNTER — Encounter: Payer: Self-pay | Admitting: Nurse Practitioner

## 2013-12-29 ENCOUNTER — Emergency Department (HOSPITAL_COMMUNITY)
Admission: EM | Admit: 2013-12-29 | Discharge: 2013-12-29 | Disposition: A | Discharge status: Home / Self Care | Payer: Medicaid Other | Attending: Emergency Medicine | Admitting: Emergency Medicine

## 2013-12-29 ENCOUNTER — Encounter (HOSPITAL_COMMUNITY): Payer: Self-pay

## 2013-12-29 DIAGNOSIS — Z87440 Personal history of urinary (tract) infections: Secondary | ICD-10-CM | POA: Insufficient documentation

## 2013-12-29 DIAGNOSIS — Z87718 Personal history of other specified (corrected) congenital malformations of genitourinary system: Secondary | ICD-10-CM | POA: Diagnosis not present

## 2013-12-29 DIAGNOSIS — K219 Gastro-esophageal reflux disease without esophagitis: Secondary | ICD-10-CM | POA: Diagnosis not present

## 2013-12-29 DIAGNOSIS — Z8751 Personal history of pre-term labor: Secondary | ICD-10-CM | POA: Diagnosis not present

## 2013-12-29 DIAGNOSIS — Z3202 Encounter for pregnancy test, result negative: Secondary | ICD-10-CM | POA: Insufficient documentation

## 2013-12-29 DIAGNOSIS — Z79899 Other long term (current) drug therapy: Secondary | ICD-10-CM | POA: Diagnosis not present

## 2013-12-29 DIAGNOSIS — Z9089 Acquired absence of other organs: Secondary | ICD-10-CM | POA: Diagnosis not present

## 2013-12-29 DIAGNOSIS — R1032 Left lower quadrant pain: Secondary | ICD-10-CM | POA: Diagnosis present

## 2013-12-29 DIAGNOSIS — Z7952 Long term (current) use of systemic steroids: Secondary | ICD-10-CM | POA: Diagnosis not present

## 2013-12-29 DIAGNOSIS — Z87442 Personal history of urinary calculi: Secondary | ICD-10-CM | POA: Diagnosis not present

## 2013-12-29 DIAGNOSIS — R109 Unspecified abdominal pain: Secondary | ICD-10-CM

## 2013-12-29 DIAGNOSIS — Z720 Tobacco use: Secondary | ICD-10-CM | POA: Diagnosis not present

## 2013-12-29 LAB — URINALYSIS, ROUTINE W REFLEX MICROSCOPIC
Bilirubin Urine: NEGATIVE
Glucose, UA: NEGATIVE mg/dL
KETONES UR: NEGATIVE mg/dL
LEUKOCYTES UA: NEGATIVE
NITRITE: NEGATIVE
PROTEIN: NEGATIVE mg/dL
Specific Gravity, Urine: >1.03 — ABNORMAL HIGH (ref 1.005–1.030)
Urobilinogen, UA: 0.2 mg/dL (ref 0.0–1.0)
pH: 5.5 (ref 5.0–8.0)

## 2013-12-29 LAB — URINE MICROSCOPIC-ADD ON

## 2013-12-29 LAB — PREGNANCY, URINE: Preg Test, Ur: NEGATIVE

## 2013-12-29 MED ORDER — OXYCODONE-ACETAMINOPHEN 5-325 MG PO TABS: 1.0000 | ORAL_TABLET | ORAL | Status: DC | PRN

## 2013-12-29 MED ORDER — HYDROMORPHONE HCL 1 MG/ML IJ SOLN
1.0000 mg | Freq: Once | INTRAMUSCULAR | Status: AC
  Administered 2013-12-29: 1 mg via INTRAMUSCULAR
  Filled 2013-12-29: qty 1

## 2013-12-29 MED ORDER — NITROFURANTOIN MONOHYD MACRO 100 MG PO CAPS: 100.0000 mg | ORAL_CAPSULE | Freq: Two times a day (BID) | ORAL | Status: DC

## 2013-12-29 MED ORDER — NITROFURANTOIN MONOHYD MACRO 100 MG PO CAPS
100.0000 mg | ORAL_CAPSULE | Freq: Two times a day (BID) | ORAL | Status: DC
  Administered 2013-12-29: 100 mg via ORAL
  Filled 2013-12-29: qty 1

## 2013-12-29 MED ORDER — OXYCODONE-ACETAMINOPHEN 5-325 MG PO TABS: 2.0000 | ORAL_TABLET | ORAL | Status: DC | PRN

## 2013-12-29 MED ORDER — ONDANSETRON 8 MG PO TBDP
8.0000 mg | ORAL_TABLET | Freq: Once | ORAL | Status: AC
  Administered 2013-12-29: 8 mg via ORAL
  Filled 2013-12-29: qty 1

## 2013-12-29 NOTE — ED Notes (Signed)
Patient c/o left lower back/flank pain that started this morning and painful urination. Patient states she has one kidney, and was advised by urologist to come to ED for evaluation.

## 2013-12-29 NOTE — ED Provider Notes (Addendum)
CSN: [id number]     Arrival date & time 12/29/13  2009 History  This chart was scribed for Donnetta HutchingBrian Hersey Maclellan, MD by Evon Slackerrance Branch, ED Scribe. This patient was seen in room APA19/APA19 and the patient's care was started at 9:27 PM.    Chief Complaint  Patient presents with  . Flank Pain   Patient is a 28 y.o. female presenting with flank pain. The history is provided by the patient. No language interpreter was used.  Flank Pain   HPI Comments: Alice [name] is a 28 y.o. female with a PMHx of UPJ, Hx of kidney stones, reccurent UTIs and morbid obesity and PMSx of right neprectomy who presents to the Emergency Department complaining of sudden left lower flank pain onset this morning. Pt states that the pain is radiating into her lower back.  Pt states she has associated dysuria. No fever, chills.  Multiple CT scans in the past. She sees a urologist near [place].  Past Medical History  Diagnosis Date  . Congenital obstruction of ureteropelvic junction (UPJ)   . History of kidney stones   . 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 History of recurrent UTIs   . Round ligament pain 09/10/2012  . Morbid obesity   . GERD (gastroesophageal reflux disease)   . Preterm labor    Past Surgical History  Procedure Laterality Date  . Nephrectomy      right side  . Nasal sinus surgery    . Tonsillectomy    . Other surgical history      ulner nerve removal  . Addenoidectomy    . Cesarean section N/A 02/22/2013    Procedure: CESAREAN SECTION/TWINS;  Surgeon: Lazaro ArmsLuther H Eure, MD;  Location: [facility];  Service: Obstetrics;  Laterality: N/A;   Family History  Problem Relation Age of Onset  . Hypertension Mother   . Hyperlipidemia Mother   . Heart disease Father     MI  . Alcohol abuse Father   . Alcohol abuse Brother   . Diabetes Maternal Grandmother   . Hyperlipidemia Maternal Grandmother    History  Substance Use Topics  . Smoking status: Current Every Day Smoker -- 0.50  packs/day    Types: Cigarettes    Last Attempt to Quit: 06/21/2012  . Smokeless tobacco: Never Used  . Alcohol Use: No   OB History    Gravida Para Term Preterm AB TAB SAB Ectopic Multiple Living   3 3 2 1     2 5      Review of Systems  Genitourinary: Positive for flank pain.  A complete 10 system review of systems was obtained and all systems are negative except as noted in the HPI and PMH.    Allergies  Cinnamon flavor; Codeine; Flomax; Ibuprofen; Levofloxacin; and Reglan  Home Medications   Prior to Admission medications   Medication Sig Start Date End Date Taking? Authorizing Provider  ibuprofen (ADVIL,MOTRIN) 200 MG tablet Take 200 mg by mouth every 6 (six) hours as needed for mild pain or moderate pain.   Yes Historical Provider, MD  medroxyPROGESTERone (DEPO-PROVERA) 150 MG/ML injection Inject 1 mL (150 mg total) into the muscle every 3 (three) months. 03/22/13  Yes Tilda BurrowJohn Ferguson V, MD  naproxen sodium (ALEVE) 220 MG tablet Take 220 mg by mouth daily as needed (for pain).   Yes Historical Provider, MD  omeprazole (PRILOSEC) 20 MG capsule Take 20 mg by mouth at bedtime. 09/10/12  Yes Adline PotterJennifer A Griffin, NP  neomycin-polymyxin-hydrocortisone (CORTISPORIN) otic solution Place 4 drops into both ears 4 (four) times daily. Patient not taking: Reported on 12/29/2013 11/29/13   Mary-Margaret Daphine DeutscherMartin, FNP  nitrofurantoin, macrocrystal-monohydrate, (MACROBID) 100 MG capsule Take 1 capsule (100 mg total) by mouth 2 (two) times daily. X 7 days 12/29/13   Donnetta HutchingBrian Nayah Lukens, MD  oxyCODONE-acetaminophen (PERCOCET) 5-325 MG per tablet Take 1-2 tablets by mouth every 4 (four) hours as needed. 12/29/13   Donnetta HutchingBrian Illeana Edick, MD  oxyCODONE-acetaminophen (PERCOCET) 5-325 MG per tablet Take 2 tablets by mouth every 4 (four) hours as needed. 12/29/13   Donnetta HutchingBrian Johniece Hornbaker, MD   Triage Vitals; BP 121/78 mmHg  Pulse 113  Temp(Src) 97.7 F (36.5 C) (Oral)  Resp 24  Ht 5\' 8"  (1.727 m)  Wt 379 lb 6.4 oz (172.095 kg)  BMI  57.70 kg/m2  SpO2 100%  LMP 12/15/2013  Breastfeeding? No  Physical Exam  Constitutional: She is oriented to person, place, and time. She appears well-developed and well-nourished.  obese  HENT:  Head: Normocephalic and atraumatic.  Eyes: Conjunctivae and EOM are normal. Pupils are equal, round, and reactive to light.  Neck: Normal range of motion. Neck supple.  Cardiovascular: Normal rate, regular rhythm and normal heart sounds.   Pulmonary/Chest: Effort normal and breath sounds normal.  Abdominal: Soft. Bowel sounds are normal.  Musculoskeletal: Normal range of motion.  Neurological: She is alert and oriented to person, place, and time.  Skin: Skin is warm and dry.  Psychiatric: She has a normal mood and affect. Her behavior is normal.  Nursing note and vitals reviewed.   ED Course  Procedures (including critical care time) DIAGNOSTIC STUDIES: Oxygen Saturation is 100% on RA, normal by my interpretation.    COORDINATION OF CARE:  10:01 PM-Discussed treatment plan which includes UA with pt at bedside and pt agreed to plan.      Labs Review Labs Reviewed  URINALYSIS, ROUTINE W REFLEX MICROSCOPIC - Abnormal; Notable for the following:    APPearance CLOUDY (*)    Specific Gravity, Urine >1.030 (*)    Hgb urine dipstick SMALL (*)    All other components within normal limits  URINE MICROSCOPIC-ADD ON - Abnormal; Notable for the following:    Squamous Epithelial / LPF MANY (*)    Bacteria, UA FEW (*)    All other components within normal limits  URINE CULTURE  PREGNANCY, URINE    Imaging Review No results found.   EKG Interpretation None      MDM   Final diagnoses:  Left flank pain    Patient is nontoxic. Normal vital signsNo clear evidence of a urinary tract infection, but a small amount of hemoglobin in the urine. Will start antibiotic secondary to immunocompromised state.  Also prescription for pain. Follow-up with her urologist.  Will avoid CT scan  secondary to excessive radiation and multiple scans in the past   I personally performed the services described in this documentation, which was scribed in my presence. The recorded information has been reviewed and is accurate.      Donnetta HutchingBrian Saide Lanuza, MD 12/29/13 [phone]  Donnetta HutchingBrian Jentry Mcqueary, MD 12/29/13 [phone]

## 2013-12-29 NOTE — Discharge Instructions (Signed)
Urinalysis showed a small amount of blood, but no obvious infection. However, because of your compromised kidney situation, we will start antibiotics. Also prescription for pain medication. Follow-up your urologist.

## 2013-12-31 LAB — URINE CULTURE

## 2014-01-20 ENCOUNTER — Emergency Department (HOSPITAL_COMMUNITY)
Admission: EM | Admit: 2014-01-20 | Discharge: 2014-01-20 | Disposition: A | Discharge status: Home / Self Care | Payer: Medicaid Other | Attending: Emergency Medicine | Admitting: Emergency Medicine

## 2014-01-20 ENCOUNTER — Encounter (HOSPITAL_COMMUNITY): Payer: Self-pay | Admitting: Cardiology

## 2014-01-20 DIAGNOSIS — R109 Unspecified abdominal pain: Secondary | ICD-10-CM | POA: Diagnosis present

## 2014-01-20 DIAGNOSIS — Q6239 Other obstructive defects of renal pelvis and ureter: Secondary | ICD-10-CM | POA: Insufficient documentation

## 2014-01-20 DIAGNOSIS — Z905 Acquired absence of kidney: Secondary | ICD-10-CM | POA: Insufficient documentation

## 2014-01-20 DIAGNOSIS — Q6211 Congenital occlusion of ureteropelvic junction: Secondary | ICD-10-CM | POA: Insufficient documentation

## 2014-01-20 DIAGNOSIS — Z720 Tobacco use: Secondary | ICD-10-CM | POA: Insufficient documentation

## 2014-01-20 DIAGNOSIS — Z79899 Other long term (current) drug therapy: Secondary | ICD-10-CM | POA: Diagnosis not present

## 2014-01-20 DIAGNOSIS — R509 Fever, unspecified: Secondary | ICD-10-CM | POA: Diagnosis not present

## 2014-01-20 DIAGNOSIS — Z87440 Personal history of urinary (tract) infections: Secondary | ICD-10-CM | POA: Diagnosis not present

## 2014-01-20 DIAGNOSIS — K219 Gastro-esophageal reflux disease without esophagitis: Secondary | ICD-10-CM | POA: Insufficient documentation

## 2014-01-20 DIAGNOSIS — Z87442 Personal history of urinary calculi: Secondary | ICD-10-CM | POA: Insufficient documentation

## 2014-01-20 DIAGNOSIS — Z8751 Personal history of pre-term labor: Secondary | ICD-10-CM | POA: Insufficient documentation

## 2014-01-20 DIAGNOSIS — Z3202 Encounter for pregnancy test, result negative: Secondary | ICD-10-CM | POA: Insufficient documentation

## 2014-01-20 LAB — URINALYSIS, ROUTINE W REFLEX MICROSCOPIC
Bilirubin Urine: NEGATIVE
Glucose, UA: NEGATIVE mg/dL
Hgb urine dipstick: NEGATIVE
Ketones, ur: NEGATIVE mg/dL
NITRITE: NEGATIVE
PH: 6 (ref 5.0–8.0)
Protein, ur: NEGATIVE mg/dL
SPECIFIC GRAVITY, URINE: 1.025 (ref 1.005–1.030)
Urobilinogen, UA: 0.2 mg/dL (ref 0.0–1.0)

## 2014-01-20 LAB — CBC WITH DIFFERENTIAL/PLATELET
Basophils Absolute: 0 10*3/uL (ref 0.0–0.1)
Basophils Relative: 0 % (ref 0–1)
EOS ABS: 0.5 10*3/uL (ref 0.0–0.7)
EOS PCT: 5 % (ref 0–5)
HEMATOCRIT: 44.4 % (ref 36.0–46.0)
Hemoglobin: 14.8 g/dL (ref 12.0–15.0)
LYMPHS ABS: 2.9 10*3/uL (ref 0.7–4.0)
LYMPHS PCT: 29 % (ref 12–46)
MCH: 29.6 pg (ref 26.0–34.0)
MCHC: 33.3 g/dL (ref 30.0–36.0)
MCV: 88.8 fL (ref 78.0–100.0)
MONO ABS: 0.7 10*3/uL (ref 0.1–1.0)
MONOS PCT: 7 % (ref 3–12)
Neutro Abs: 6 10*3/uL (ref 1.7–7.7)
Neutrophils Relative %: 59 % (ref 43–77)
Platelets: 297 10*3/uL (ref 150–400)
RBC: 5 MIL/uL (ref 3.87–5.11)
RDW: 13.4 % (ref 11.5–15.5)
WBC: 10.2 10*3/uL (ref 4.0–10.5)

## 2014-01-20 LAB — PREGNANCY, URINE: Preg Test, Ur: NEGATIVE

## 2014-01-20 LAB — COMPREHENSIVE METABOLIC PANEL
ALT: 32 U/L (ref 0–35)
ANION GAP: 11 (ref 5–15)
AST: 24 U/L (ref 0–37)
Albumin: 3.6 g/dL (ref 3.5–5.2)
Alkaline Phosphatase: 90 U/L (ref 39–117)
BUN: 15 mg/dL (ref 6–23)
CALCIUM: 9.2 mg/dL (ref 8.4–10.5)
CO2: 26 meq/L (ref 19–32)
CREATININE: 0.86 mg/dL (ref 0.50–1.10)
Chloride: 104 mEq/L (ref 96–112)
GFR calc non Af Amer: >90 mL/min (ref >90)
GLUCOSE: 87 mg/dL (ref 70–99)
Potassium: 4 mEq/L (ref 3.7–5.3)
Sodium: 141 mEq/L (ref 137–147)
TOTAL PROTEIN: 7.4 g/dL (ref 6.0–8.3)
Total Bilirubin: 0.2 mg/dL — ABNORMAL LOW (ref 0.3–1.2)

## 2014-01-20 LAB — URINE MICROSCOPIC-ADD ON

## 2014-01-20 MED ORDER — HYDROCODONE-ACETAMINOPHEN 5-325 MG PO TABS: 2.0000 | ORAL_TABLET | ORAL | Status: DC | PRN

## 2014-01-20 MED ORDER — HYDROCODONE-ACETAMINOPHEN 5-325 MG PO TABS
2.0000 | ORAL_TABLET | Freq: Once | ORAL | Status: AC
  Administered 2014-01-20: 2 via ORAL
  Filled 2014-01-20: qty 2

## 2014-01-20 MED ORDER — SODIUM CHLORIDE 0.9 % IV BOLUS (SEPSIS)
1000.0000 mL | Freq: Once | INTRAVENOUS | Status: AC
  Administered 2014-01-20: 1000 mL via INTRAVENOUS

## 2014-01-20 MED ORDER — HYDROMORPHONE HCL 1 MG/ML IJ SOLN
1.0000 mg | Freq: Once | INTRAMUSCULAR | Status: AC
  Administered 2014-01-20: 1 mg via INTRAVENOUS
  Filled 2014-01-20: qty 1

## 2014-01-20 MED ORDER — GI COCKTAIL ~~LOC~~
30.0000 mL | Freq: Once | ORAL | Status: AC
  Administered 2014-01-20: 30 mL via ORAL
  Filled 2014-01-20: qty 30

## 2014-01-20 NOTE — ED Notes (Signed)
Left flank pain,  Fever and blood in urine for a couple of months.  Worse since Friday.  Sees a urologist.

## 2014-01-20 NOTE — ED Provider Notes (Signed)
CSN: [id number]     Arrival date & time 01/20/14  1826 History  This chart was scribed for Alice RollerBrian D Samson Ralph, MD by Alice [name], ED Scribe. This patient was seen in room APA10/APA10 and the patient's care was started at 7:36 PM.    Chief Complaint  Patient presents with  . Fever  . Flank Pain   The history is provided by the patient. No language interpreter was used.    HPI Comments: Alice [name] is a 28 y.o. female who presents to the Emergency Department complaining of intermittent "flank" pain, hematuria (pink, cloudy) and intermittent fever beginning 3 days PTA (TMAX 101-102). She has been managing her symptoms at home with OTC pain meds; last dose of tylenol at 1700.   Her care is managed by a urologist near [place]; she is scheduled for an exploratory surgery 6 days from now for this ongoing issue. She has been treated with multiple rounds of antibiotics with no improvement. 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   Past Medical History  Diagnosis Date  . Congenital obstruction of ureteropelvic junction (UPJ)   . History of kidney stones   . 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 History of recurrent UTIs   . Round ligament pain 09/10/2012  . Morbid obesity   . GERD (gastroesophageal reflux disease)   . Preterm labor    Past Surgical History  Procedure Laterality Date  . Nephrectomy      right side  . Nasal sinus surgery    . Tonsillectomy    . Other surgical history      ulner nerve removal  . Addenoidectomy    . Cesarean section N/A 02/22/2013    Procedure: CESAREAN SECTION/TWINS;  Surgeon: Lazaro ArmsLuther H Eure, MD;  Location: [facility];  Service: Obstetrics;  Laterality: N/A;   Family History  Problem Relation Age of Onset  . Hypertension Mother   . Hyperlipidemia Mother   . Heart disease Father     MI  . Alcohol abuse Father   . Alcohol abuse Brother   . Diabetes Maternal Grandmother   . Hyperlipidemia  Maternal Grandmother    History  Substance Use Topics  . Smoking status: Current Every Day Smoker -- 0.50 packs/day    Types: Cigarettes    Last Attempt to Quit: 06/21/2012  . Smokeless tobacco: Never Used  . Alcohol Use: No   OB History    Gravida Para Term Preterm AB TAB SAB Ectopic Multiple Living   3 3 2 1     2 5      Review of Systems  Constitutional: Positive for fever.  Genitourinary: Positive for hematuria and flank pain.  All other systems reviewed and are negative.   Allergies  Cinnamon flavor; Codeine; Flomax; Ibuprofen; Levofloxacin; and Reglan  Home Medications   Prior to Admission medications   Medication Sig Start Date End Date Taking? Authorizing Provider  acetaminophen (TYLENOL) 500 MG tablet Take 1,500 mg by mouth daily as needed for moderate pain.   Yes Historical Provider, MD  omeprazole (PRILOSEC) 20 MG capsule Take 20 mg by mouth at bedtime. 09/10/12  Yes Adline PotterJennifer A Griffin, NP  HYDROcodone-acetaminophen (NORCO/VICODIN) 5-325 MG per tablet Take 2 tablets by mouth every 4 (four) hours as needed. 01/20/14   Alice RollerBrian D Aiven Kampe, MD  medroxyPROGESTERone (DEPO-PROVERA) 150 MG/ML injection Inject 1 mL (150 mg total) into the muscle every 3 (three) months. Patient not taking: Reported  on 01/20/2014 03/22/13   Tilda Burrow, MD  naproxen sodium (ALEVE) 220 MG tablet Take 220 mg by mouth daily as needed (for pain).    Historical Provider, MD  nitrofurantoin, macrocrystal-monohydrate, (MACROBID) 100 MG capsule Take 1 capsule (100 mg total) by mouth 2 (two) times daily. X 7 days Patient not taking: Reported on 01/20/2014 12/29/13   Donnetta Hutching, MD  oxyCODONE-acetaminophen (PERCOCET) 5-325 MG per tablet Take 2 tablets by mouth every 4 (four) hours as needed. Patient not taking: Reported on 01/20/2014 12/29/13   Donnetta Hutching, MD   BP 132/96 mmHg  Pulse 79  Temp(Src) 98 F (36.7 C) (Oral)  Resp 20  Ht 5\' 9"  (1.753 m)  Wt 381 lb 4.8 oz (172.957 kg)  BMI 56.28 kg/m2  SpO2 98%   LMP 01/13/2014 Physical Exam  Constitutional: She appears well-developed and well-nourished. No distress.  HENT:  Head: Normocephalic and atraumatic.  Mouth/Throat: Oropharynx is clear and moist. No oropharyngeal exudate.  Eyes: Conjunctivae and EOM are normal. Pupils are equal, round, and reactive to light. Right eye exhibits no discharge. Left eye exhibits no discharge. No scleral icterus.  Neck: Normal range of motion. Neck supple. No JVD present. No thyromegaly present.  Cardiovascular: Normal rate, regular rhythm, normal heart sounds and intact distal pulses.  Exam reveals no gallop and no friction rub.   No murmur heard. Pulmonary/Chest: Effort normal and breath sounds normal. No respiratory distress. She has no wheezes. She has no rales.  Abdominal: Soft. Bowel sounds are normal. She exhibits no distension and no mass. There is tenderness (Large area of tenderness to even light touch near left flank without rashes or swelling; tenderness extends from thoracic spine to mid axillary line).  Musculoskeletal: Normal range of motion. She exhibits no edema or tenderness.  Lymphadenopathy:    She has no cervical adenopathy.  Neurological: She is alert. Coordination normal.  Skin: Skin is warm and dry. No rash noted. No erythema.  Psychiatric: She has a normal mood and affect. Her behavior is normal.  Nursing note and vitals reviewed.   ED Course  Procedures   DIAGNOSTIC STUDIES: Oxygen Saturation is 99% on RA, normal by my interpretation.    COORDINATION OF CARE: 7:40 PM Discussed treatment plan with pt at bedside and pt agreed to plan.  9:42 PM Discussed results of studies with patient; patient verbalized understanding. Patient requested Vicodin at this time.    Labs Review Labs Reviewed  URINALYSIS, ROUTINE W REFLEX MICROSCOPIC - Abnormal; Notable for the following:    APPearance HAZY (*)    Leukocytes, UA SMALL (*)    All other components within normal limits   COMPREHENSIVE METABOLIC PANEL - Abnormal; Notable for the following:    Total Bilirubin 0.2 (*)    All other components within normal limits  URINE MICROSCOPIC-ADD ON - Abnormal; Notable for the following:    Squamous Epithelial / LPF MANY (*)    Bacteria, UA FEW (*)    All other components within normal limits  PREGNANCY, URINE  CBC WITH DIFFERENTIAL    Imaging Review No results found.    MDM   Final diagnoses:  Flank pain   UA and labs normal - VS normal - pain meds given with improvement - appears stable for d/c. Can f/u with Uro.  Meds given in ED:  Medications  HYDROcodone-acetaminophen (NORCO/VICODIN) 5-325 MG per tablet 2 tablet (not administered)  HYDROmorphone (DILAUDID) injection 1 mg (1 mg Intravenous Given 01/20/14 2008)  sodium chloride 0.9 %  bolus 1,000 mL (1,000 mLs Intravenous New Bag/Given 01/20/14 2008)  gi cocktail (Maalox,Lidocaine,Donnatal) (30 mLs Oral Given 01/20/14 2020)    New Prescriptions   HYDROCODONE-ACETAMINOPHEN (NORCO/VICODIN) 5-325 MG PER TABLET    Take 2 tablets by mouth every 4 (four) hours as needed.      I personally performed the services described in this documentation, which was scribed in my presence. The recorded information has been reviewed and is accurate.      Alice RollerBrian D Camerin Ladouceur, MD 01/20/14 [phone]

## 2014-01-20 NOTE — Discharge Instructions (Signed)
Please call your doctor for a followup appointment within 24-48 hours. When you talk to your doctor please let them know that you were seen in the emergency department and have them acquire all of your records so that they can discuss the findings with you and formulate a treatment plan to fully care for your new and ongoing problems. ° °

## 2014-02-01 ENCOUNTER — Encounter (HOSPITAL_COMMUNITY): Payer: Self-pay | Admitting: Emergency Medicine

## 2014-02-01 ENCOUNTER — Emergency Department (HOSPITAL_COMMUNITY)
Admission: EM | Admit: 2014-02-01 | Discharge: 2014-02-01 | Disposition: A | Discharge status: Home / Self Care | Payer: Medicaid Other | Attending: Emergency Medicine | Admitting: Emergency Medicine

## 2014-02-01 DIAGNOSIS — Z720 Tobacco use: Secondary | ICD-10-CM | POA: Diagnosis not present

## 2014-02-01 DIAGNOSIS — R109 Unspecified abdominal pain: Secondary | ICD-10-CM

## 2014-02-01 DIAGNOSIS — Z87442 Personal history of urinary calculi: Secondary | ICD-10-CM | POA: Insufficient documentation

## 2014-02-01 DIAGNOSIS — Z905 Acquired absence of kidney: Secondary | ICD-10-CM | POA: Insufficient documentation

## 2014-02-01 DIAGNOSIS — N179 Acute kidney failure, unspecified: Secondary | ICD-10-CM | POA: Diagnosis not present

## 2014-02-01 DIAGNOSIS — K219 Gastro-esophageal reflux disease without esophagitis: Secondary | ICD-10-CM | POA: Insufficient documentation

## 2014-02-01 DIAGNOSIS — Z87440 Personal history of urinary (tract) infections: Secondary | ICD-10-CM | POA: Diagnosis not present

## 2014-02-01 DIAGNOSIS — Z792 Long term (current) use of antibiotics: Secondary | ICD-10-CM | POA: Diagnosis not present

## 2014-02-01 DIAGNOSIS — Z79899 Other long term (current) drug therapy: Secondary | ICD-10-CM | POA: Insufficient documentation

## 2014-02-01 DIAGNOSIS — Z3202 Encounter for pregnancy test, result negative: Secondary | ICD-10-CM | POA: Diagnosis not present

## 2014-02-01 DIAGNOSIS — R1032 Left lower quadrant pain: Secondary | ICD-10-CM | POA: Diagnosis not present

## 2014-02-01 DIAGNOSIS — Q6211 Congenital occlusion of ureteropelvic junction: Secondary | ICD-10-CM | POA: Diagnosis not present

## 2014-02-01 DIAGNOSIS — Z791 Long term (current) use of non-steroidal anti-inflammatories (NSAID): Secondary | ICD-10-CM | POA: Insufficient documentation

## 2014-02-01 DIAGNOSIS — Z9889 Other specified postprocedural states: Secondary | ICD-10-CM | POA: Diagnosis not present

## 2014-02-01 LAB — BASIC METABOLIC PANEL
Anion gap: 16 — ABNORMAL HIGH (ref 5–15)
BUN: 16 mg/dL (ref 6–23)
CALCIUM: 9.4 mg/dL (ref 8.4–10.5)
CO2: 21 mEq/L (ref 19–32)
Chloride: 101 mEq/L (ref 96–112)
Creatinine, Ser: 1.14 mg/dL — ABNORMAL HIGH (ref 0.50–1.10)
GFR calc Af Amer: 75 mL/min — ABNORMAL LOW (ref >90)
GFR calc non Af Amer: 64 mL/min — ABNORMAL LOW (ref >90)
Glucose, Bld: 111 mg/dL — ABNORMAL HIGH (ref 70–99)
Potassium: 4.2 mEq/L (ref 3.7–5.3)
SODIUM: 138 meq/L (ref 137–147)

## 2014-02-01 LAB — URINALYSIS, ROUTINE W REFLEX MICROSCOPIC
Bilirubin Urine: NEGATIVE
Glucose, UA: NEGATIVE mg/dL
Ketones, ur: NEGATIVE mg/dL
Nitrite: NEGATIVE
Protein, ur: NEGATIVE mg/dL
UROBILINOGEN UA: 0.2 mg/dL (ref 0.0–1.0)
pH: 6.5 (ref 5.0–8.0)

## 2014-02-01 LAB — CBC WITH DIFFERENTIAL/PLATELET
Basophils Absolute: 0 10*3/uL (ref 0.0–0.1)
Basophils Relative: 0 % (ref 0–1)
EOS ABS: 0.4 10*3/uL (ref 0.0–0.7)
EOS PCT: 3 % (ref 0–5)
HCT: 45.3 % (ref 36.0–46.0)
Hemoglobin: 14.8 g/dL (ref 12.0–15.0)
LYMPHS ABS: 2.1 10*3/uL (ref 0.7–4.0)
Lymphocytes Relative: 18 % (ref 12–46)
MCH: 28.6 pg (ref 26.0–34.0)
MCHC: 32.7 g/dL (ref 30.0–36.0)
MCV: 87.5 fL (ref 78.0–100.0)
MONOS PCT: 7 % (ref 3–12)
Monocytes Absolute: 0.8 10*3/uL (ref 0.1–1.0)
Neutro Abs: 8.4 10*3/uL — ABNORMAL HIGH (ref 1.7–7.7)
Neutrophils Relative %: 72 % (ref 43–77)
PLATELETS: 324 10*3/uL (ref 150–400)
RBC: 5.18 MIL/uL — ABNORMAL HIGH (ref 3.87–5.11)
RDW: 13.3 % (ref 11.5–15.5)
WBC: 11.8 10*3/uL — ABNORMAL HIGH (ref 4.0–10.5)

## 2014-02-01 LAB — PREGNANCY, URINE: PREG TEST UR: NEGATIVE

## 2014-02-01 LAB — URINE MICROSCOPIC-ADD ON

## 2014-02-01 MED ORDER — SODIUM CHLORIDE 0.9 % IV BOLUS (SEPSIS)
500.0000 mL | Freq: Once | INTRAVENOUS | Status: AC
  Administered 2014-02-01: 500 mL via INTRAVENOUS

## 2014-02-01 MED ORDER — HYDROMORPHONE HCL 1 MG/ML IJ SOLN
INTRAMUSCULAR | Status: AC
  Filled 2014-02-01: qty 1

## 2014-02-01 MED ORDER — FLUCONAZOLE 200 MG PO TABS: 200.0000 mg | ORAL_TABLET | Freq: Once | ORAL | Status: AC

## 2014-02-01 MED ORDER — HYDROMORPHONE HCL 1 MG/ML IJ SOLN
1.0000 mg | Freq: Once | INTRAMUSCULAR | Status: AC
  Administered 2014-02-01: 1 mg via INTRAVENOUS

## 2014-02-01 MED ORDER — SULFAMETHOXAZOLE-TRIMETHOPRIM 800-160 MG PO TABS: 1.0000 | ORAL_TABLET | Freq: Two times a day (BID) | ORAL | Status: DC

## 2014-02-01 MED ORDER — DEXTROSE 5 % IV SOLN
1.0000 g | Freq: Once | INTRAVENOUS | Status: AC
  Administered 2014-02-01: 1 g via INTRAVENOUS
  Filled 2014-02-01: qty 10

## 2014-02-01 MED ORDER — HYDROMORPHONE HCL 1 MG/ML IJ SOLN
1.0000 mg | Freq: Once | INTRAMUSCULAR | Status: AC
  Administered 2014-02-01: 09:00:00 via INTRAVENOUS
  Filled 2014-02-01: qty 1

## 2014-02-01 NOTE — ED Notes (Signed)
Pt has been given 2 cups of water with success. Pt tolerated fluids well.

## 2014-02-01 NOTE — ED Provider Notes (Signed)
CSN: [id number]     Arrival date & time 02/01/14  0903 History  This chart was scribed for Alice SkeensJoshua M Emslee Lopezmartinez, MD by Ronney LionSuzanne Le, ED Scribe. This patient was seen in room APA14/APA14 and the patient's care was started at 12:38 PM.    Chief Complaint  Patient presents with  . Flank Pain    Patient is a 28 y.o. female presenting with flank pain. The history is provided by the patient. No language interpreter was used.  Flank Pain This is a recurrent problem. The current episode started 6 to 12 hours ago. The problem occurs constantly. Relieved by: pushing on the area.    HPI Comments: Alice MediaBrittany L [name] is a 28 y.o. female who presents to the Emergency Department complaining of severe, constant, left abdomen radiating to left flank pain that is recurrent. Per husband, patient had an exploratory surgery done 1 week ago to place a stent. Four days ago, the stent was removed to ensure that her ureter didn't swell shut; she had a blood clot in ureter from where the stent was removed. She also had one stone removed, and was diagnosed with Loin pain-haematuria syndrome. Patient experienced pain after the stent removal that felt like a spasm, but her pain improved over the next several days. She then abruptly woke up with severe pain this morning, and has not been able to urinate since. She states that her left kidney "feels dilated, like it's not pumping urine through the ureter." She states that her left kidney had dilated before, and her ureter had completely collapsed. She confirms nausea this morning, subjective fever and chills, and pedal edema that began 1 week ago. Her husband states that pushing on the area where her left kidney is located relieves the pain. Patient is on Oxycontin and Percocet, but she hasn't needed to use them in the last 3 days. Her husband states that Dilaudid previously has not provided relief. Patient reports allergies to Levoquin and Flomax. 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 She has also been diagnosed by a condition whereby her kidney frequently bleeds, although she states that her doctors do not know why. Dr. Jannetta Quintichard [place] in [place] is her urologist.   Past Medical History  Diagnosis Date  . Congenital obstruction of ureteropelvic junction (UPJ)   . History of kidney stones   . 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 History of recurrent UTIs   . Round ligament pain 09/10/2012  . Morbid obesity   . GERD (gastroesophageal reflux disease)   . Preterm labor    Past Surgical History  Procedure Laterality Date  . Nephrectomy      right side  . Nasal sinus surgery    . Tonsillectomy    . Other surgical history      ulner nerve removal  . Addenoidectomy    . Cesarean section N/A 02/22/2013    Procedure: CESAREAN SECTION/TWINS;  Surgeon: Lazaro ArmsLuther H Eure, MD;  Location: [facility];  Service: Obstetrics;  Laterality: N/A;   Family History  Problem Relation Age of Onset  . Hypertension Mother   . Hyperlipidemia Mother   . Heart disease Father     MI  . Alcohol abuse Father   . Alcohol abuse Brother   . Diabetes Maternal Grandmother   . Hyperlipidemia Maternal Grandmother    History  Substance Use Topics  . Smoking status: Current Every Day Smoker -- 0.50  packs/day    Types: Cigarettes    Last Attempt to Quit: 06/21/2012  . Smokeless tobacco: Never Used  . Alcohol Use: No   OB History    Gravida Para Term Preterm AB TAB SAB Ectopic Multiple Living   3 3 2 1     2 5      Review of Systems  Constitutional: Positive for fever and chills.  Cardiovascular: Positive for leg swelling.  Gastrointestinal: Positive for nausea.  Genitourinary: Positive for flank pain.  All other systems reviewed and are negative.     Allergies  Cinnamon flavor; Codeine; Flomax; Ibuprofen; Levofloxacin; and Reglan  Home Medications   Prior to Admission medications   Medication Sig Start Date End  Date Taking? Authorizing Provider  docusate sodium (COLACE) 100 MG capsule Take 100 mg by mouth 2 (two) times daily.   Yes Historical Provider, MD  omeprazole (PRILOSEC) 20 MG capsule Take 20 mg by mouth at bedtime. 09/10/12  Yes Adline Potter, NP  oxybutynin (DITROPAN) 5 MG tablet Take 5 mg by mouth every 8 (eight) hours.   Yes Historical Provider, MD  acetaminophen (TYLENOL) 500 MG tablet Take 1,500 mg by mouth daily as needed for moderate pain.    Historical Provider, MD  HYDROcodone-acetaminophen (NORCO/VICODIN) 5-325 MG per tablet Take 2 tablets by mouth every 4 (four) hours as needed. Patient not taking: Reported on 02/01/2014 01/20/14   Vida Roller, MD  medroxyPROGESTERone (DEPO-PROVERA) 150 MG/ML injection Inject 1 mL (150 mg total) into the muscle every 3 (three) months. Patient not taking: Reported on 01/20/2014 03/22/13   Tilda Burrow, MD  naproxen sodium (ALEVE) 220 MG tablet Take 220 mg by mouth daily as needed (for pain).    Historical Provider, MD  nitrofurantoin, macrocrystal-monohydrate, (MACROBID) 100 MG capsule Take 1 capsule (100 mg total) by mouth 2 (two) times daily. X 7 days Patient not taking: Reported on 01/20/2014 12/29/13   Donnetta Hutching, MD  oxyCODONE-acetaminophen (PERCOCET) 5-325 MG per tablet Take 2 tablets by mouth every 4 (four) hours as needed. Patient not taking: Reported on 01/20/2014 12/29/13   Donnetta Hutching, MD  sulfamethoxazole-trimethoprim [facility] DS) 800-160 MG per tablet Take 1 tablet by mouth every 12 (twelve) hours. 02/02/14   Alice Skeens, MD   BP 128/80 mmHg  Pulse 84  Resp 17  Ht 5\' 9"  (1.753 m)  Wt 381 lb (172.82 kg)  BMI 56.24 kg/m2  SpO2 99%  LMP 01/13/2014 Physical Exam  Constitutional: She is oriented to person, place, and time. She appears well-developed and well-nourished. No distress.  HENT:  Head: Normocephalic and atraumatic.  Eyes: Conjunctivae and EOM are normal.  Neck: Neck supple. No tracheal deviation present.   Cardiovascular: Normal rate.   Pulmonary/Chest: Effort normal. No respiratory distress.  Abdominal: Soft. There is tenderness. There is no guarding (no focal guarding).  Left flank tenderness down to LLQ.  Genitourinary:  Left flank tenderness.  Musculoskeletal: Normal range of motion.  Neurological: She is alert and oriented to person, place, and time.  Skin: Skin is warm and dry.  Psychiatric: She has a normal mood and affect. Her behavior is normal.  Nursing note and vitals reviewed.   ED Course  Procedures (including critical care time) Emergency Focused Ultrasound Exam Limited retroperitoneal ultrasound of kidneys  Performed and interpreted by Dr. Jodi Mourning Indication: flank pain Focused abdominal ultrasound with both kidneys imaged in transverse and longitudinal planes in real-time. Interpretation: mild left hydronephrosis visualized.   Difficult due to body habitus.  Images archived electronically  Limited Ultrasound of bladder  Performed by Dr. Jodi MourningZavitz Indication: to assess for urinary retention and/or bladder volume prior to urinary catheter Technique:  Low frequency probe utilized in two planes to assess bladder volume in real-time. Findings: Bladder volume minimal  Images were archived electronically   DIAGNOSTIC STUDIES: Oxygen Saturation is 97% on room air, normal by my interpretation.    COORDINATION OF CARE: 9:22 AM - Discussed treatment plan with pt at bedside which includes pain medication and catheter and pt agreed to plan.   Labs Review Labs Reviewed  CBC WITH DIFFERENTIAL - Abnormal; Notable for the following:    WBC 11.8 (*)    RBC 5.18 (*)    Neutro Abs 8.4 (*)    All other components within normal limits  BASIC METABOLIC PANEL - Abnormal; Notable for the following:    Glucose, Bld 111 (*)    Creatinine, Ser 1.14 (*)    GFR calc non Af Amer 64 (*)    GFR calc Af Amer 75 (*)    Anion gap 16 (*)    All other components within normal limits   URINALYSIS, ROUTINE W REFLEX MICROSCOPIC - Abnormal; Notable for the following:    Specific Gravity, Urine <1.005 (*)    Hgb urine dipstick LARGE (*)    Leukocytes, UA SMALL (*)    All other components within normal limits  URINE MICROSCOPIC-ADD ON - Abnormal; Notable for the following:    Bacteria, UA FEW (*)    All other components within normal limits  URINE CULTURE  PREGNANCY, URINE    Imaging Review No results found.   EKG Interpretation None      MDM   Final diagnoses:  Acute left flank pain  Acute renal failure, unspecified acute renal failure type  History of unilateral nephrectomy   I personally performed the services described in this documentation, which was scribed in my presence. The recorded information has been reviewed and is accurate.  Patient with complicated urology history presents with recurrent left flank pain. This similar previous ever more severe. Patient is not of a fever, improved significantly with pain meds and fluids. Patient's had multiple CT scans in the past and she is comfortable on holding today as bedside ultrasound showed mild hydronephrosis and this is similar to previous. Patient urinated on her own in ER. On recheck comfortable sitting up eating. Rocephin given for possible urine infection, culture sent. Discussed close follow-up outpatient strict reasons to return for admission. Patient and significant other comfortable with this plan and will call urology on Monday, Bactrim oral for home, patient has pain meds at home.  Results and differential diagnosis were discussed with the patient/parent/guardian. Close follow up outpatient was discussed, comfortable with the plan.   Medications  HYDROmorphone (DILAUDID) injection 1 mg (1 mg Intravenous Given 02/01/14 0925)  sodium chloride 0.9 % bolus 500 mL (0 mLs Intravenous Stopped 02/01/14 1127)  HYDROmorphone (DILAUDID) injection 1 mg ( Intravenous Given 02/01/14 0925)  sodium chloride 0.9 %  bolus 500 mL (500 mLs Intravenous New Bag/Given 02/01/14 1127)  cefTRIAXone (ROCEPHIN) 1 g in dextrose 5 % 50 mL IVPB (0 g Intravenous Stopped 02/01/14 1157)    Filed Vitals:   02/01/14 0911 02/01/14 0913 02/01/14 1000 02/01/14 1130  BP:  151/134 127/73 128/80  Pulse:  100 92 84  Resp:   16 17  Height: 5\' 9"  (1.753 m)     Weight: 381 lb (172.82 kg)     SpO2:  97% 97% 99%    Final diagnoses:  Acute left flank pain  Acute renal failure, unspecified acute renal failure type  History of unilateral nephrectomy      Alice Skeens, MD 02/01/14 1240

## 2014-02-01 NOTE — ED Notes (Signed)
Dr. Jodi MourningZavitz verbalized to give pt a bolus of saline and wait before cathing pt. Upon EDP assessment, pt had small amount of urine in her bladder.

## 2014-02-01 NOTE — Discharge Instructions (Signed)
Call and plan to see your urologist and or primary doctor Monday for recheck and to follow up culture.  If you were given medicines take as directed.  If you are on coumadin or contraceptives realize their levels and effectiveness is altered by many different medicines.  If you have any reaction (rash, tongues swelling, other) to the medicines stop taking and see a physician.   Please follow up as directed and return to the ER or see a physician for new or worsening symptoms.  Thank you. Filed Vitals:   02/01/14 0911 02/01/14 0913 02/01/14 1000 02/01/14 1130  BP:  151/134 127/73 128/80  Pulse:  100 92 84  Resp:   16 17  Height: 5\' 9"  (1.753 m)     Weight: 381 lb (172.82 kg)     SpO2:  97% 97% 99%

## 2014-02-01 NOTE — ED Notes (Signed)
Patient crying and moaning in pain. Patient c/o left flank pain that radiates into left lower abd. Per patient pain woke her from sleep this morning. Patient reports having Surgery on left kidney on Saturday for bleeding into left kidney.

## 2014-02-01 NOTE — ED Notes (Signed)
Pt is producing urine. 2x bathroom trips had been successful.

## 2014-02-03 LAB — URINE CULTURE: Colony Count: 45000

## 2014-03-06 ENCOUNTER — Encounter (HOSPITAL_COMMUNITY): Payer: Self-pay

## 2014-03-06 ENCOUNTER — Emergency Department (HOSPITAL_COMMUNITY): Payer: Medicaid Other

## 2014-03-06 ENCOUNTER — Emergency Department (HOSPITAL_COMMUNITY)
Admission: EM | Admit: 2014-03-06 | Discharge: 2014-03-06 | Disposition: A | Discharge status: Home / Self Care | Payer: Medicaid Other | Attending: Emergency Medicine | Admitting: Emergency Medicine

## 2014-03-06 DIAGNOSIS — S8392XA Sprain of unspecified site of left knee, initial encounter: Secondary | ICD-10-CM | POA: Insufficient documentation

## 2014-03-06 DIAGNOSIS — Q6211 Congenital occlusion of ureteropelvic junction: Secondary | ICD-10-CM | POA: Insufficient documentation

## 2014-03-06 DIAGNOSIS — Z79899 Other long term (current) drug therapy: Secondary | ICD-10-CM | POA: Insufficient documentation

## 2014-03-06 DIAGNOSIS — S8992XA Unspecified injury of left lower leg, initial encounter: Secondary | ICD-10-CM | POA: Diagnosis present

## 2014-03-06 DIAGNOSIS — Z905 Acquired absence of kidney: Secondary | ICD-10-CM | POA: Diagnosis not present

## 2014-03-06 DIAGNOSIS — K219 Gastro-esophageal reflux disease without esophagitis: Secondary | ICD-10-CM | POA: Insufficient documentation

## 2014-03-06 DIAGNOSIS — R52 Pain, unspecified: Secondary | ICD-10-CM

## 2014-03-06 DIAGNOSIS — Z87440 Personal history of urinary (tract) infections: Secondary | ICD-10-CM | POA: Insufficient documentation

## 2014-03-06 DIAGNOSIS — Y9289 Other specified places as the place of occurrence of the external cause: Secondary | ICD-10-CM | POA: Diagnosis not present

## 2014-03-06 DIAGNOSIS — R300 Dysuria: Secondary | ICD-10-CM | POA: Diagnosis not present

## 2014-03-06 DIAGNOSIS — Y9389 Activity, other specified: Secondary | ICD-10-CM | POA: Diagnosis not present

## 2014-03-06 DIAGNOSIS — W010XXA Fall on same level from slipping, tripping and stumbling without subsequent striking against object, initial encounter: Secondary | ICD-10-CM | POA: Diagnosis not present

## 2014-03-06 DIAGNOSIS — Z720 Tobacco use: Secondary | ICD-10-CM | POA: Diagnosis not present

## 2014-03-06 DIAGNOSIS — Z87442 Personal history of urinary calculi: Secondary | ICD-10-CM | POA: Insufficient documentation

## 2014-03-06 DIAGNOSIS — Q6239 Other obstructive defects of renal pelvis and ureter: Secondary | ICD-10-CM | POA: Insufficient documentation

## 2014-03-06 DIAGNOSIS — Y998 Other external cause status: Secondary | ICD-10-CM | POA: Diagnosis not present

## 2014-03-06 MED ORDER — HYDROMORPHONE HCL 1 MG/ML IJ SOLN
1.0000 mg | Freq: Once | INTRAMUSCULAR | Status: AC
  Administered 2014-03-06: 1 mg via INTRAVENOUS
  Filled 2014-03-06: qty 1

## 2014-03-06 MED ORDER — OXYCODONE-ACETAMINOPHEN 5-325 MG PO TABS: 2.0000 | ORAL_TABLET | ORAL | Status: DC | PRN

## 2014-03-06 MED ORDER — OXYCODONE-ACETAMINOPHEN 5-325 MG PO TABS
2.0000 | ORAL_TABLET | Freq: Once | ORAL | Status: AC
  Administered 2014-03-06: 2 via ORAL
  Filled 2014-03-06: qty 2

## 2014-03-06 NOTE — ED Notes (Signed)
Pt reports slipped on ice at home getting into her truck.  EMS reports swelling and deformity.  EMS started IV and gave 10mg  morphine prior to arrival.  Pt unable to put any weight on left leg.  Says has no feeling in the bottom of her left foot but is able to move her foot.

## 2014-03-06 NOTE — ED Notes (Signed)
Knee immobilizer too small for left knee. EDP aware and gave verbal order to apply ace wrap to left knee. Ace wrap applied. Pt tolerated well.

## 2014-03-06 NOTE — ED Provider Notes (Signed)
CSN: [id number]     Arrival date & time 03/06/14  1140 History   First MD Initiated Contact with Patient 03/06/14 1303     Chief Complaint  Patient presents with  . Fall    Patient is a 29 y.o. female presenting with fall. The history is provided by the patient.  Fall This is a new problem. Episode onset: just prior to arrival. The problem occurs constantly. Pertinent negatives include no chest pain, no abdominal pain and no headaches. Exacerbated by: movement. The symptoms are relieved by rest.  Patient presents s/p fall onto left knee She reports she slipped outside and landed directly on left knee No other injury is reported - no head/neck/back injury.  No left hip/ankle injury reported She reports hearing "pop" in her left knee and she reports she "twisted" her knee   Past Medical History  Diagnosis Date  . Congenital obstruction of ureteropelvic junction (UPJ)   . History of kidney stones   . 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 History of recurrent UTIs   . Round ligament pain 09/10/2012  . Morbid obesity   . GERD (gastroesophageal reflux disease)   . Preterm labor    Past Surgical History  Procedure Laterality Date  . Nephrectomy      right side  . Nasal sinus surgery    . Tonsillectomy    . Other surgical history      ulner nerve removal  . Addenoidectomy    . Cesarean section N/A 02/22/2013    Procedure: CESAREAN SECTION/TWINS;  Surgeon: Lazaro ArmsLuther H Eure, MD;  Location: [facility];  Service: Obstetrics;  Laterality: N/A;   Family History  Problem Relation Age of Onset  . Hypertension Mother   . Hyperlipidemia Mother   . Heart disease Father     MI  . Alcohol abuse Father   . Alcohol abuse Brother   . Diabetes Maternal Grandmother   . Hyperlipidemia Maternal Grandmother    History  Substance Use Topics  . Smoking status: Current Every Day Smoker -- 0.50 packs/day    Types: Cigarettes    Last Attempt to Quit: 06/21/2012  . Smokeless  tobacco: Never Used  . Alcohol Use: No   OB History    Gravida Para Term Preterm AB TAB SAB Ectopic Multiple Living   3 3 2 1     2 5      Review of Systems  Constitutional: Negative for fever.  Cardiovascular: Negative for chest pain.  Gastrointestinal: Negative for vomiting and abdominal pain.  Genitourinary:       Chronic dysuria   Musculoskeletal: Positive for arthralgias. Negative for back pain and neck pain.  Neurological: Negative for headaches.  All other systems reviewed and are negative.     Allergies  Cinnamon flavor; Codeine; Flomax; Ibuprofen; Levofloxacin; and Reglan  Home Medications   Prior to Admission medications   Medication Sig Start Date End Date Taking? Authorizing Provider  acetaminophen (TYLENOL) 500 MG tablet Take 1,500 mg by mouth daily as needed for moderate pain.   Yes Historical Provider, MD  docusate sodium (COLACE) 100 MG capsule Take 100 mg by mouth 2 (two) times daily.   Yes Historical Provider, MD  naproxen sodium (ALEVE) 220 MG tablet Take 220 mg by mouth daily as needed (for pain).   Yes Historical Provider, MD  omeprazole (PRILOSEC) 20 MG capsule Take 20 mg by mouth at bedtime. 09/10/12  Yes Adline PotterJennifer A Griffin, NP  oxybutynin (DITROPAN) 5 MG  tablet Take 5 mg by mouth every 8 (eight) hours.   Yes Historical Provider, MD  medroxyPROGESTERone (DEPO-PROVERA) 150 MG/ML injection Inject 1 mL (150 mg total) into the muscle every 3 (three) months. Patient not taking: Reported on 01/20/2014 03/22/13   Tilda Burrow, MD  oxyCODONE-acetaminophen (PERCOCET/ROXICET) 5-325 MG per tablet Take 2 tablets by mouth every 4 (four) hours as needed for severe pain. 03/06/14   Joya Gaskins, MD   BP 114/69 mmHg  Pulse 108  Temp(Src) 97.5 F (36.4 C) (Oral)  Resp 18  Ht  (1.753 m)  Wt 375 lb (170.099 kg)  BMI 55.35 kg/m2  SpO2 98%  LMP 03/04/2014 Physical Exam CONSTITUTIONAL: Well developed/well nourished HEAD: Normocephalic/atraumatic EYES:  EOMI/PERRL ENMT: Mucous membranes moist NECK: supple no meningeal signs SPINE/BACK:entire spine nontender CV: S1/S2 noted, no murmurs/rubs/gallops noted LUNGS: Lungs are clear to auscultation bilaterally, no apparent distress ABDOMEN: soft, nontender, no rebound or guarding, bowel sounds noted throughout abdomen, morbid obeseity GU:no cva tenderness NEURO: Pt is awake/alert/appropriate, moves all extremitiesx4.  She move left foot without difficulty EXTREMITIES: pulses normal/equal, full ROM.  Distal pulses equal/intact in both feet.  She has tenderness to palpation of left knee and with ROM of left knee.  Due to body habitus, difficult to determine if there is an effusion present.  She can flex left knee but limited due to pain.  There is no left ankle/foot tenderness.   SKIN: warm, color normal PSYCH: no abnormalities of mood noted, alert and oriented to situation  ED Course  Procedures  Despite morphine given by EMS and Dilaudid X2 in the ED she reports she still reports pain Xray does not reveal any fx/dislocation Distal vascular intact I doubt she has occult knee dislocation She has no left hip tenderness with ROM Advised NWB, pain control/ice and f/u with ortho next week Crutches ordered Advised only short course of ibuprofen due to h/o nephrectomy   Imaging Review Dg Knee Complete 4 Views Left  03/06/2014   CLINICAL DATA:  LEFT knee pain, slipped on ice getting out of truck, swelling and deformity, unable to bear weight LEFT leg  EXAM: LEFT KNEE - COMPLETE 4+ VIEW  COMPARISON:  None.  FINDINGS: Osseous mineralization normal for technique.  Joint spaces preserved.  No acute fracture, dislocation or bone destruction.  No definite knee joint effusion  IMPRESSION: No acute osseous abnormalities.   Electronically Signed   By: Ulyses Southward M.D.   On: 03/06/2014 12:38   Medications  HYDROmorphone (DILAUDID) injection 1 mg (1 mg Intravenous Given 03/06/14 1327)  HYDROmorphone (DILAUDID)  injection 1 mg (1 mg Intravenous Given 03/06/14 1417)  oxyCODONE-acetaminophen (PERCOCET/ROXICET) 5-325 MG per tablet 2 tablet (2 tablets Oral Given 03/06/14 1503)    MDM   Final diagnoses:  Sprain of left knee, initial encounter    Nursing notes including past medical history and social history reviewed and considered in documentation xrays/imaging reviewed by myself and considered during evaluation     Joya Gaskins, MD 03/06/14 1528

## 2014-04-14 ENCOUNTER — Encounter (HOSPITAL_COMMUNITY): Payer: Self-pay | Admitting: *Deleted

## 2014-04-14 ENCOUNTER — Emergency Department (HOSPITAL_COMMUNITY)
Admission: EM | Admit: 2014-04-14 | Discharge: 2014-04-14 | Disposition: A | Discharge status: Home / Self Care | Payer: Medicaid Other | Attending: Emergency Medicine | Admitting: Emergency Medicine

## 2014-04-14 ENCOUNTER — Emergency Department (HOSPITAL_COMMUNITY): Payer: Medicaid Other

## 2014-04-14 DIAGNOSIS — K219 Gastro-esophageal reflux disease without esophagitis: Secondary | ICD-10-CM | POA: Insufficient documentation

## 2014-04-14 DIAGNOSIS — W1839XA Other fall on same level, initial encounter: Secondary | ICD-10-CM | POA: Diagnosis not present

## 2014-04-14 DIAGNOSIS — Q6239 Other obstructive defects of renal pelvis and ureter: Secondary | ICD-10-CM | POA: Insufficient documentation

## 2014-04-14 DIAGNOSIS — Y9389 Activity, other specified: Secondary | ICD-10-CM | POA: Diagnosis not present

## 2014-04-14 DIAGNOSIS — Z720 Tobacco use: Secondary | ICD-10-CM | POA: Diagnosis not present

## 2014-04-14 DIAGNOSIS — Z87442 Personal history of urinary calculi: Secondary | ICD-10-CM | POA: Diagnosis not present

## 2014-04-14 DIAGNOSIS — Z79899 Other long term (current) drug therapy: Secondary | ICD-10-CM | POA: Insufficient documentation

## 2014-04-14 DIAGNOSIS — S8992XA Unspecified injury of left lower leg, initial encounter: Secondary | ICD-10-CM | POA: Diagnosis not present

## 2014-04-14 DIAGNOSIS — Y9289 Other specified places as the place of occurrence of the external cause: Secondary | ICD-10-CM | POA: Diagnosis not present

## 2014-04-14 DIAGNOSIS — Y998 Other external cause status: Secondary | ICD-10-CM | POA: Diagnosis not present

## 2014-04-14 DIAGNOSIS — Q6211 Congenital occlusion of ureteropelvic junction: Secondary | ICD-10-CM | POA: Insufficient documentation

## 2014-04-14 DIAGNOSIS — R202 Paresthesia of skin: Secondary | ICD-10-CM | POA: Diagnosis not present

## 2014-04-14 DIAGNOSIS — Z8742 Personal history of other diseases of the female genital tract: Secondary | ICD-10-CM | POA: Diagnosis not present

## 2014-04-14 DIAGNOSIS — Z87440 Personal history of urinary (tract) infections: Secondary | ICD-10-CM | POA: Insufficient documentation

## 2014-04-14 MED ORDER — OXYCODONE-ACETAMINOPHEN 5-325 MG PO TABS: 1.0000 | ORAL_TABLET | ORAL | Status: DC | PRN

## 2014-04-14 NOTE — ED Notes (Signed)
Portable xray at bedside.

## 2014-04-14 NOTE — ED Provider Notes (Signed)
CSN: [id number]     Arrival date & time 04/14/14  1810 History  This chart was scribed for non-physician practitioner Kerrie Buffalo, NP, working with Vanetta Mulders, MD by Littie Deeds, ED Scribe. This patient was seen in room APFT22/APFT22 and the patient's care was started at 7:05 PM.      Chief Complaint  Patient presents with  . Knee Pain   Patient is a 29 y.o. female presenting with knee pain. The history is provided by the patient. No language interpreter was used.  Knee Pain Location:  Knee Injury: yes   Knee location:  L knee Pain details:    Quality:  Throbbing   Onset quality:  Sudden   Duration:  2 days   Timing:  Constant Prior injury to area:  Yes Associated symptoms: numbness and tingling   Risk factors: obesity    HPI Comments: Alice [name] is a 29 y.o. female who presents to the Emergency Department complaining of sudden onset, constant, throbbing left knee pain with swelling that started 2 days ago when her knee gave out while ambulating. Patient reports having a fall on 03/12/14 and injured the ACL, meniscus and ligaments of her left knee. She states she has lost mobility after the fall. She has been doing PT since then and was doing PT 2 days ago. While she was ambulating in her living room at home, she notes that her left knee gave out and also felt a pop in her knee. Patient also reports having associated numbness and tingling on the bottom of her left foot when standing and a sharp, stabbing pain in the center of her foot. She contacted her doctor who told her to come to the ED for concern for loss of blood flow.  PCP: [facility] Williams Che at [facility]  Past Medical History  Diagnosis Date  . History of kidney stones   . 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 History of recurrent UTIs   . Round ligament pain 09/10/2012  . Morbid obesity   . GERD (gastroesophageal reflux disease)   . Preterm labor   . Congenital obstruction of ureteropelvic  junction (UPJ)    Past Surgical History  Procedure Laterality Date  . Nephrectomy      right side  . Nasal sinus surgery    . Tonsillectomy    . Other surgical history      ulner nerve removal  . Addenoidectomy    . Cesarean section N/A 02/22/2013    Procedure: CESAREAN SECTION/TWINS;  Surgeon: [name], MD;  Location: [facility];  Service: Obstetrics;  Laterality: N/A;   Family History  Problem Relation Age of Onset  . Hypertension Mother   . Hyperlipidemia Mother   . Heart disease Father     MI  . Alcohol abuse Father   . Alcohol abuse Brother   . Diabetes Maternal Grandmother   . Hyperlipidemia Maternal Grandmother    History  Substance Use Topics  . Smoking status: Current Every Day Smoker -- 0.50 packs/day    Types: Cigarettes    Last Attempt to Quit: 06/21/2012  . Smokeless tobacco: Never Used  . Alcohol Use: No   OB History    Gravida Para Term Preterm AB TAB SAB Ectopic Multiple Living   Review of Systems  Musculoskeletal: Positive for arthralgias.       Left knee pain  Neurological:  Tingling  all other systems negative    Allergies  Cinnamon flavor; Codeine; Flomax; Ibuprofen; Levofloxacin; and Reglan  Home Medications   Prior to Admission medications   Medication Sig Start Date End Date Taking? Authorizing Provider  acetaminophen (TYLENOL) 500 MG tablet Take 1,500 mg by mouth daily as needed for moderate pain.   Yes Historical Provider, MD  naproxen sodium (ALEVE) 220 MG tablet Take 220 mg by mouth daily as needed (for pain).   Yes Historical Provider, MD  omeprazole (PRILOSEC) 20 MG capsule Take 20 mg by mouth at bedtime. 09/10/12  Yes Adline PotterJennifer A Griffin, NP  traMADol (ULTRAM) 50 MG tablet Take 50 mg by mouth every 6 (six) hours as needed for moderate pain or severe pain.   Yes Historical Provider, MD  oxyCODONE-acetaminophen (ROXICET) 5-325 MG per tablet Take 1 tablet by mouth every 4 (four) hours as needed for severe pain.  04/14/14   Keonta Monceaux Orlene OchM Asani Mcburney, NP   BP 93/50 mmHg  Pulse 98  Temp(Src) 98.1 F (36.7 C) (Oral)  Resp 24  Ht 5\' 9"  (1.753 m)  Wt 384 lb (174.181 kg)  BMI 56.68 kg/m2  SpO2 98%  LMP 04/01/2014 (Exact Date)  Breastfeeding? No Physical Exam  Constitutional: She is oriented to person, place, and time. No distress.  Morbidly obese  HENT:  Head: Normocephalic and atraumatic.  Eyes: EOM are normal.  Neck: Neck supple.  Cardiovascular: Normal rate and intact distal pulses.   Pulmonary/Chest: Effort normal.  Abdominal: Soft. There is no tenderness.  Musculoskeletal: Normal range of motion.       Left knee: She exhibits swelling. She exhibits no ecchymosis, no deformity, no laceration, no erythema and normal alignment. Decreased range of motion: due to pain. Tenderness found.       Legs: Pedal pulses equal, adequate circulation, good touch sensation.  Neurological: She is alert and oriented to person, place, and time. No cranial nerve deficit.  Skin: Skin is warm and dry.  Psychiatric: She has a normal mood and affect. Her behavior is normal.  Nursing note and vitals reviewed.   ED Course  Procedures  I discussed this case with Dr. Deretha EmoryZackowski DIAGNOSTIC STUDIES: Oxygen Saturation is 98% on room air, normal by my interpretation.    COORDINATION OF CARE: 7:07 PM-Discussed treatment plan which includes XR imaging with pt at bedside and pt agreed to plan.   Dg Knee Complete 4 Views Left  04/14/2014   CLINICAL DATA:  Status post fall in January and again on Saturday, left knee pain/injury  EXAM: LEFT KNEE - COMPLETE 4+ VIEW  COMPARISON:  None.  FINDINGS: No fracture or dislocation is seen.  The joint spaces are preserved.  The visualized soft tissues are unremarkable.  Possible small suprapatellar knee joint effusion.  IMPRESSION: No fracture or dislocation is seen.  Possible small suprapatellar knee joint effusion.   Electronically Signed   By: Charline BillsSriyesh  Krishnan M.D.   On: 04/14/2014 19:11      MDM  29 y.o. morbidly obese female with left knee pain s/p fall. Scheduled for surgery on the knee for a previous injury. Stable for discharge without neurovascular deficits. Will treat for pain. Ace wrap applied, ice, elevate and rest. She will follow up with her orthopedic doctor this week.   Final diagnoses:  Knee injury, left, initial encounter   I personally performed the services described in this documentation, which was scribed in my presence. The recorded information has been reviewed and is accurate.    Lien Lyman  Orlene Och, NP 04/16/14 1610  Vanetta Mulders, MD 04/17/14 1736

## 2014-04-14 NOTE — ED Notes (Signed)
Hx of fall in January with injury to lt knee,  Has been doing PT. Saturday fell in living room and felt pop in lt knee and since then numbness in lt foot

## 2014-05-01 ENCOUNTER — Telehealth: Payer: Self-pay | Admitting: Nurse Practitioner

## 2014-05-02 NOTE — Telephone Encounter (Signed)
Need note form urology stating needs referral to pain clinic

## 2014-05-02 NOTE — Telephone Encounter (Signed)
Patient aware that she will need to get records from urologist for Paulene Floormary martin, FNP

## 2014-05-03 ENCOUNTER — Emergency Department (HOSPITAL_COMMUNITY)
Admission: EM | Admit: 2014-05-03 | Discharge: 2014-05-04 | Disposition: A | Discharge status: Home / Self Care | Payer: Medicaid Other | Attending: Emergency Medicine | Admitting: Emergency Medicine

## 2014-05-03 ENCOUNTER — Encounter (HOSPITAL_COMMUNITY): Payer: Self-pay | Admitting: Emergency Medicine

## 2014-05-03 DIAGNOSIS — Z8751 Personal history of pre-term labor: Secondary | ICD-10-CM | POA: Insufficient documentation

## 2014-05-03 DIAGNOSIS — Z720 Tobacco use: Secondary | ICD-10-CM | POA: Diagnosis not present

## 2014-05-03 DIAGNOSIS — N939 Abnormal uterine and vaginal bleeding, unspecified: Secondary | ICD-10-CM | POA: Diagnosis not present

## 2014-05-03 DIAGNOSIS — Z87440 Personal history of urinary (tract) infections: Secondary | ICD-10-CM | POA: Insufficient documentation

## 2014-05-03 DIAGNOSIS — Q6211 Congenital occlusion of ureteropelvic junction: Secondary | ICD-10-CM | POA: Insufficient documentation

## 2014-05-03 DIAGNOSIS — Z79899 Other long term (current) drug therapy: Secondary | ICD-10-CM | POA: Insufficient documentation

## 2014-05-03 DIAGNOSIS — Z791 Long term (current) use of non-steroidal anti-inflammatories (NSAID): Secondary | ICD-10-CM | POA: Insufficient documentation

## 2014-05-03 DIAGNOSIS — Z3202 Encounter for pregnancy test, result negative: Secondary | ICD-10-CM | POA: Diagnosis not present

## 2014-05-03 DIAGNOSIS — K219 Gastro-esophageal reflux disease without esophagitis: Secondary | ICD-10-CM | POA: Insufficient documentation

## 2014-05-03 DIAGNOSIS — Z87442 Personal history of urinary calculi: Secondary | ICD-10-CM | POA: Diagnosis not present

## 2014-05-03 LAB — CBC WITH DIFFERENTIAL/PLATELET
Basophils Absolute: 0 10*3/uL (ref 0.0–0.1)
Basophils Relative: 0 % (ref 0–1)
EOS ABS: 0.5 10*3/uL (ref 0.0–0.7)
Eosinophils Relative: 4 % (ref 0–5)
HEMATOCRIT: 41.9 % (ref 36.0–46.0)
HEMOGLOBIN: 13.9 g/dL (ref 12.0–15.0)
Lymphocytes Relative: 29 % (ref 12–46)
Lymphs Abs: 3.7 10*3/uL (ref 0.7–4.0)
MCH: 29.3 pg (ref 26.0–34.0)
MCHC: 33.2 g/dL (ref 30.0–36.0)
MCV: 88.4 fL (ref 78.0–100.0)
Monocytes Absolute: 0.9 10*3/uL (ref 0.1–1.0)
Monocytes Relative: 7 % (ref 3–12)
NEUTROS ABS: 7.5 10*3/uL (ref 1.7–7.7)
NEUTROS PCT: 60 % (ref 43–77)
Platelets: 308 10*3/uL (ref 150–400)
RBC: 4.74 MIL/uL (ref 3.87–5.11)
RDW: 13.5 % (ref 11.5–15.5)
WBC: 12.5 10*3/uL — ABNORMAL HIGH (ref 4.0–10.5)

## 2014-05-03 LAB — BASIC METABOLIC PANEL
Anion gap: 6 (ref 5–15)
BUN: 18 mg/dL (ref 6–23)
CHLORIDE: 109 mmol/L (ref 96–112)
CO2: 25 mmol/L (ref 19–32)
CREATININE: 0.94 mg/dL (ref 0.50–1.10)
Calcium: 8.6 mg/dL (ref 8.4–10.5)
GFR calc non Af Amer: 81 mL/min — ABNORMAL LOW (ref >90)
GLUCOSE: 91 mg/dL (ref 70–99)
Potassium: 3.7 mmol/L (ref 3.5–5.1)
Sodium: 140 mmol/L (ref 135–145)

## 2014-05-03 LAB — WET PREP, GENITAL
Clue Cells Wet Prep HPF POC: NONE SEEN
Trich, Wet Prep: NONE SEEN
Yeast Wet Prep HPF POC: NONE SEEN

## 2014-05-03 LAB — ABO/RH: ABO/RH(D): O POS

## 2014-05-03 NOTE — ED Notes (Signed)
Patient reports took two at home pregnancy tests which were positive. States she believes she is about 3.[redacted] weeks pregnant. States started having small amount of bright red blood about an hour ago. Complaining of mild cramping to left lower abdomen.

## 2014-05-04 LAB — HCG, QUANTITATIVE, PREGNANCY: hCG, Beta Chain, Quant, S: <1 m[IU]/mL (ref <5)

## 2014-05-04 NOTE — ED Provider Notes (Signed)
CSN: [id number]     Arrival date & time 05/03/14  2059 History   First MD Initiated Contact with Patient 05/03/14 2158     Chief Complaint  Patient presents with  . Vaginal Bleeding  . Routine Prenatal Visit     (Consider location/radiation/quality/duration/timing/severity/associated sxs/prior Treatment) HPI Comments: The patient is a 29 year old female, she is currently pregnant based on a home pregnancy test that she took 3 days ago. Her last menstrual cycle was approximately one month ago. She is very regular, she does not use protection, she is trying to get pregnant. She has had a prior C-section, has 3 children. She complains of abdominal cramping with some vaginal bleeding. This is bright red blood, this has slightly improved over the evening and is now on an orange tint. Her abdominal cramping is resolved, she has no associated diarrhea fevers chills nausea or vomiting.  Patient is a 29 y.o. female presenting with vaginal bleeding. The history is provided by the patient.  Vaginal Bleeding   Past Medical History  Diagnosis Date  . History of kidney stones   . 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 History of recurrent UTIs   . Round ligament pain 09/10/2012  . Morbid obesity   . GERD (gastroesophageal reflux disease)   . Preterm labor   . Congenital obstruction of ureteropelvic junction (UPJ)    Past Surgical History  Procedure Laterality Date  . Nephrectomy      right side  . Nasal sinus surgery    . Tonsillectomy    . Other surgical history      ulner nerve removal  . Addenoidectomy    . Cesarean section N/A 02/22/2013    Procedure: CESAREAN SECTION/TWINS;  Surgeon: Lazaro ArmsLuther H Eure, MD;  Location: [facility];  Service: Obstetrics;  Laterality: N/A;   Family History  Problem Relation Age of Onset  . Hypertension Mother   . Hyperlipidemia Mother   . Heart disease Father     MI  . Alcohol abuse Father   . Alcohol abuse Brother   . Diabetes Maternal  Grandmother   . Hyperlipidemia Maternal Grandmother    History  Substance Use Topics  . Smoking status: Current Every Day Smoker -- 0.50 packs/day    Types: Cigarettes    Last Attempt to Quit: 06/21/2012  . Smokeless tobacco: Never Used  . Alcohol Use: No   OB History    Gravida Para Term Preterm AB TAB SAB Ectopic Multiple Living   4 3 2 1     2 5      Review of Systems  Genitourinary: Positive for vaginal bleeding.  All other systems reviewed and are negative.     Allergies  Cinnamon flavor; Codeine; Flomax; Ibuprofen; Levofloxacin; and Reglan  Home Medications   Prior to Admission medications   Medication Sig Start Date End Date Taking? Authorizing Provider  acetaminophen (TYLENOL) 500 MG tablet Take 1,500 mg by mouth daily as needed for moderate pain.   Yes Historical Provider, MD  naproxen sodium (ALEVE) 220 MG tablet Take 220 mg by mouth daily as needed (for pain).   Yes Historical Provider, MD  omeprazole (PRILOSEC) 40 MG capsule Take 40 mg by mouth daily.   Yes Historical Provider, MD  oxyCODONE-acetaminophen (ROXICET) 5-325 MG per tablet Take 1 tablet by mouth every 4 (four) hours as needed for severe pain. 04/14/14  Yes Hope Orlene OchM Neese, NP  traMADol (ULTRAM) 50 MG tablet Take 50 mg by mouth every 6 (  six) hours as needed for moderate pain or severe pain.   Yes Historical Provider, MD   BP 140/94 mmHg  Pulse 90  Temp(Src) 98 F (36.7 C) (Oral)  Resp 22  Ht  (1.753 m)  Wt 380 lb (172.367 kg)  BMI 56.09 kg/m2  SpO2 97%  LMP 04/01/2014 (Exact Date) Physical Exam  Constitutional: She appears well-developed and well-nourished. No distress.  HENT:  Head: Normocephalic and atraumatic.  Mouth/Throat: Oropharynx is clear and moist. No oropharyngeal exudate.  Eyes: Conjunctivae and EOM are normal. Pupils are equal, round, and reactive to light. Right eye exhibits no discharge. Left eye exhibits no discharge. No scleral icterus.  Neck: Normal range of motion. Neck  supple. No JVD present. No thyromegaly present.  Cardiovascular: Normal rate, regular rhythm, normal heart sounds and intact distal pulses.  Exam reveals no gallop and no friction rub.   No murmur heard. Pulmonary/Chest: Effort normal and breath sounds normal. No respiratory distress. She has no wheezes. She has no rales.  Abdominal: Soft. Bowel sounds are normal. She exhibits no distension and no mass. There is no tenderness.  Morbidly obese, nontender abdomen  Genitourinary:  Chaperone present for vaginal exam, small amount of bright red blood in the vaginal vault, no trauma, no injuries, no cervical motion tenderness or adnexal tenderness.or cervical os closed, no tissue    Musculoskeletal: Normal range of motion. She exhibits no edema or tenderness.  Lymphadenopathy:    She has no cervical adenopathy.  Neurological: She is alert. Coordination normal.  Skin: Skin is warm and dry. No rash noted. No erythema.  Psychiatric: She has a normal mood and affect. Her behavior is normal.  Nursing note and vitals reviewed.   ED Course  Procedures (including critical care time) Labs Review Labs Reviewed  WET PREP, GENITAL - Abnormal; Notable for the following:    WBC, Wet Prep HPF POC FEW (*)    All other components within normal limits  CBC WITH DIFFERENTIAL/PLATELET - Abnormal; Notable for the following:    WBC 12.5 (*)    All other components within normal limits  BASIC METABOLIC PANEL - Abnormal; Notable for the following:    GFR calc non Af Amer 81 (*)    All other components within normal limits  HCG, QUANTITATIVE, PREGNANCY  ABO/RH  GC/CHLAMYDIA PROBE AMP ([place])    Imaging Review No results found.    MDM   Final diagnoses:  Abnormal vaginal bleeding    Bedside ultrasound reveals no intrauterine pregnancy, fluid present in the bladder, no adnexal fluid or masses, hCG Quant pending, vital signs stable, would be unlikely to be ruptured ectopic given dates of gestation  leaving her in [redacted] weeks pregnant.  Quant neg - reasuring, pt given reassured - stable for d/c  Eber Hong, MD 05/04/14 [phone]

## 2014-05-04 NOTE — ED Notes (Signed)
Discharge instructions given, pt demonstrated teach back and verbal understanding. No concerns voiced.  

## 2014-05-04 NOTE — Discharge Instructions (Signed)
You are not pregnant based on our blood pregnancy test.  See your gynecologist as needed

## 2014-05-05 ENCOUNTER — Ambulatory Visit: Payer: Self-pay | Admitting: Women's Health

## 2014-05-05 LAB — GC/CHLAMYDIA PROBE AMP (~~LOC~~) NOT AT ARMC
Chlamydia: NEGATIVE
Neisseria Gonorrhea: NEGATIVE

## 2014-05-12 DIAGNOSIS — R319 Hematuria, unspecified: Secondary | ICD-10-CM | POA: Insufficient documentation

## 2014-05-12 DIAGNOSIS — Z905 Acquired absence of kidney: Secondary | ICD-10-CM | POA: Insufficient documentation

## 2014-05-13 ENCOUNTER — Encounter: Payer: Self-pay | Admitting: Women's Health

## 2014-05-13 ENCOUNTER — Ambulatory Visit (INDEPENDENT_AMBULATORY_CARE_PROVIDER_SITE_OTHER): Payer: Medicaid Other | Admitting: Women's Health

## 2014-05-13 VITALS — BP 128/80 | Ht 69.0 in | Wt 378.0 lb

## 2014-05-13 DIAGNOSIS — Z319 Encounter for procreative management, unspecified: Secondary | ICD-10-CM

## 2014-05-13 DIAGNOSIS — N926 Irregular menstruation, unspecified: Secondary | ICD-10-CM

## 2014-05-13 DIAGNOSIS — E669 Obesity, unspecified: Secondary | ICD-10-CM

## 2014-05-13 DIAGNOSIS — F17200 Nicotine dependence, unspecified, uncomplicated: Secondary | ICD-10-CM | POA: Insufficient documentation

## 2014-05-13 DIAGNOSIS — Z720 Tobacco use: Secondary | ICD-10-CM

## 2014-05-13 NOTE — Progress Notes (Signed)
Patient ID: Alice [name], female   DOB: 11/27/1985, 29 y.o.   MRN: [id number]   [facility]  Patient name: Alice [name] MRN [id number]  Date of birth: 11/27/1985  CC & HPI:  Alice [name] is a 29 y.o. [id number] Caucasian female presenting today for report of trying to get pregnant since last May w/o success. Last depo shot in May 2015- stopped them b/c they were making her gain weight and she had heavy frequent periods on it. Has never had any trouble getting pregnant in past. Since coming off depo, periods are regular, last 5-7days, normal flow, +cramping and +Mittelschmerz/ovulation pain monthly. Had ED visit 3/20 for +HPT and vaginal bleeding- states this bleeding was very heavy and had clots, HCG at ED was <1. Has lost 20lbs w/in last few months, intentionally. Last Partner is 23yo and had low testosterone and Vit B so has been taking meds to help w/ that.  He has recently gotten a job that will have him out of town for ~3wks/mth for the next 4147yrs.    Pertinent History Reviewed:  Medical & Surgical Hx:   Past Medical History  Diagnosis Date  . History of kidney stones   . 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 History of recurrent UTIs   . Round ligament pain 09/10/2012  . Morbid obesity   . GERD (gastroesophageal reflux disease)   . Preterm labor   . Congenital obstruction of ureteropelvic junction (UPJ)   . Torn ACL    Past Surgical History  Procedure Laterality Date  . Nephrectomy      right side  . Nasal sinus surgery    . Tonsillectomy    . Other surgical history      ulner nerve removal  . Addenoidectomy    . Cesarean section N/A 02/22/2013    Procedure: CESAREAN SECTION/TWINS;  Surgeon: Lazaro ArmsLuther H Eure, MD;  Location: [facility];  Service: Obstetrics;  Laterality: N/A;   Medications: Reviewed & Updated - see associated section Social History: Reviewed -  reports that she has been smoking Cigarettes.  She has a .25 pack-year  smoking history. She has never used smokeless tobacco.  Objective Findings:  Vitals: BP 128/80 mmHg  Ht 5\' 9"  (1.753 m)  Wt 378 lb (171.46 kg)  BMI 55.80 kg/m2  LMP 05/03/2014  Breastfeeding? No  Physical Examination: General appearance - alert, well appearing, and in no distress  No results found for this or any previous visit (from the past 24 hour(s)).   Assessment & Plan:  A:   Trying to conceive  Ovulatory cycles  Obesity  Smoker P:  Gave printed fertility tips including sex qod days 7-24 of cycle, lay w/ hips elevated x 20-1130mins, stop smoking, weight loss   Possible decreased fertility d/t lingering depo effects, recommended giving it another 6 months before further discussion/possibly having partner evaluated- states she wants to wait longer than that d/t his upcoming job and they will not have as many chances to try  Discussed option of clomid after 3963yr of trying to conceive w/o success, but sounds like pt is already having ovulatory cycles and had twins 14mths ago, and clomid increases incidence of multiple gestation- pt does not want to go this route     Marge DuncansBooker, Yetta Marceaux Randall CNM, [facility]NP-BC 05/13/2014 11:30 AM

## 2014-05-13 NOTE — Patient Instructions (Signed)
If you are trying to get pregnant:  Have sex every other day on days 7-24 of your cycle (Day 1 is the 1st day of your period) Pee before sex Lay with your hips elevated on pillows for 20-30mins after sex Do not smoke or drink alcohol Lose weight if you are overweight Take a prenatal vitamin with at least 400mcg of folic acid Decrease stress in your life  For Him:  Wear boxers instead of briefs Avoid hot baths/jacuzzi Vit C supplement Do not smoke or drink alcohol Lose weight if you are overweight    

## 2014-06-16 ENCOUNTER — Telehealth: Payer: Self-pay | Admitting: Family

## 2014-06-16 ENCOUNTER — Ambulatory Visit: Payer: Medicaid Other | Admitting: Family

## 2014-06-16 NOTE — Telephone Encounter (Signed)
Patient has Medicaid and was told she needed a referral from her PCP for home PT. Appt scheduled for tomorrow. Patient aware.

## 2014-06-17 ENCOUNTER — Encounter: Payer: Self-pay | Admitting: Physician Assistant

## 2014-06-17 ENCOUNTER — Ambulatory Visit (INDEPENDENT_AMBULATORY_CARE_PROVIDER_SITE_OTHER): Payer: Medicaid Other | Admitting: Physician Assistant

## 2014-06-17 VITALS — BP 118/77 | HR 90 | Temp 97.9°F | Ht 69.0 in | Wt 369.0 lb

## 2014-06-17 DIAGNOSIS — Z4789 Encounter for other orthopedic aftercare: Secondary | ICD-10-CM

## 2014-06-17 NOTE — Progress Notes (Signed)
   Subjective:    Patient ID: Alice [name], female    DOB: 1986/01/25, 29 y.o.   MRN: [id number]  HPI 29 y/o female presents s/p left ACL repair on 05/20/2014. Due to her insurance, she needs referral to PT from her PCP.     Review of Systems  Musculoskeletal:       Left knee pain d/t ACL repair  Psychiatric/Behavioral: Positive for dysphoric mood (at times. ).       Objective:   Physical Exam  Constitutional: She is oriented to person, place, and time. No distress.  Morbidly obese   Cardiovascular: Normal rate, regular rhythm and normal heart sounds.  Exam reveals no gallop and no friction rub.   No murmur heard. Pulmonary/Chest: Effort normal and breath sounds normal.  Musculoskeletal:  Left knee ACL repair , knee brace in place with steristrips  Neurological: She is alert and oriented to person, place, and time.  Skin: She is not diaphoretic.  Nursing note and vitals reviewed.         Assessment & Plan:  1. Aftercare for anterior cruciate ligament (ACL) repair  - Home Health - Face-to-face encounter (required for Medicare/Medicaid patients)  Patient would like to f/u in the next few weeks to discuss antidepressant medication     Shekera Beavers A. Chauncey ReadingGann PA-C

## 2014-07-01 ENCOUNTER — Telehealth: Payer: Self-pay | Admitting: Physician Assistant

## 2014-07-01 DIAGNOSIS — Z4789 Encounter for other orthopedic aftercare: Secondary | ICD-10-CM

## 2014-07-01 DIAGNOSIS — Z9889 Other specified postprocedural states: Secondary | ICD-10-CM

## 2014-07-02 NOTE — Telephone Encounter (Signed)
Stp and advised we couldn't get home PT approved but we will put in referral for Outpt PT, pt voiced understanding and referral for PT placed.

## 2014-07-11 ENCOUNTER — Ambulatory Visit: Payer: Medicaid Other | Admitting: Physical Therapy

## 2014-07-21 ENCOUNTER — Ambulatory Visit: Payer: Medicaid Other | Attending: Physician Assistant | Admitting: Physical Therapy

## 2014-07-21 DIAGNOSIS — M25662 Stiffness of left knee, not elsewhere classified: Secondary | ICD-10-CM | POA: Diagnosis not present

## 2014-07-21 DIAGNOSIS — M25562 Pain in left knee: Secondary | ICD-10-CM | POA: Diagnosis not present

## 2014-07-21 NOTE — Therapy (Signed)
[facility] [address] [place], [place], [address] Phone: [phone]   Fax:  [phone]  Physical Therapy Evaluation  Patient Details  Name: Alice [name] MRN: [id number] Date of Birth: 12/13/1985 Referring Provider:  Chelsea Primus, PA-C  Encounter Date: 07/21/2014      PT End of Session - 07/21/14 1125    Visit Number 1   Number of Visits 12   Date for PT Re-Evaluation 09/15/14   PT Start Time 1033   PT Stop Time 1101   PT Time Calculation (min) 28 min   Activity Tolerance Patient tolerated treatment well   Behavior During Therapy [facility] for tasks assessed/performed      Past Medical History  Diagnosis Date  . History of kidney stones   . 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 History of recurrent UTIs   . Round ligament pain 09/10/2012  . Morbid obesity   . GERD (gastroesophageal reflux disease)   . Preterm labor   . Congenital obstruction of ureteropelvic junction (UPJ)   . Torn ACL     Past Surgical History  Procedure Laterality Date  . Nephrectomy      right side  . Nasal sinus surgery    . Tonsillectomy    . Other surgical history      ulner nerve removal  . Addenoidectomy    . Cesarean section N/A 02/22/2013    Procedure: CESAREAN SECTION/TWINS;  Surgeon: [name], MD;  Location: [facility];  Service: Obstetrics;  Laterality: N/A;    There were no vitals filed for this visit.  Visit Diagnosis:  Left knee pain - Plan: PT plan of care cert/re-cert  Knee stiffness, left - Plan: PT plan of care cert/re-cert      Subjective Assessment - 07/21/14 1109    Subjective My knee is still sore to touch.   Limitations Standing   How long can you stand comfortably? 15-20 minutes.   Patient Stated Goals Get back use of my left knee again.   Currently in Pain? Yes   Pain Score 5    Pain Location Knee   Pain Orientation Left   Pain Descriptors / Indicators Aching;Sore   Pain Type  Surgical pain   Pain Onset More than a month ago   Pain Frequency Constant   Aggravating Factors  Being up a lot.            [facility] PT Assessment - 07/21/14 0001    Assessment   Medical Diagnosis ACL repair.   Onset Date/Surgical Date --  05/20/14.   Precautions   Precautions --  Left knee brace.   Restrictions   Weight Bearing Restrictions No   Balance Screen   Has the patient fallen in the past 6 months Yes   How many times? 3   Has the patient had a decrease in activity level because of a fear of falling?  Yes   Is the patient reluctant to leave their home because of a fear of falling?  Yes   Home Environment   Living Environment Private residence   Prior Function   Level of Independence Independent   Observation/Other Assessments-Edema    Edema --  Min+ left knee edema.   ROM / Strength   AROM / PROM / Strength AROM;Strength   AROM   Overall AROM Comments 0 to 90 degrees of active left knee flexion.   Strength   Overall Strength Comments No extensor lag.  Palpation   Palpation comment Tender distal lateral left knee and medial region over incisional sites.   Ambulation/Gait   Gait Comments Antalgic gait without assistive device and brace donned on left knee.                                PT Long Term Goals - 07/21/14 1142    PT LONG TERM GOAL #1   Title Ind with HEP.   Baseline No knowledge of appropriate ther ex.   Time 6   Period Weeks   Status New   PT LONG TERM GOAL #2   Title Perform ADL's with pain not > 2-3/10.   Baseline Pain will rise to 5-6/10.   Time 6   Period Weeks   Status New   PT LONG TERM GOAL #3   Title Walk a commnity distance without antalgia and pain not > 3/10.   Baseline Current gait is antalgic.   Time 6   Period Weeks   Status New   PT LONG TERM GOAL #4   Title Stand 30 minutes with pain not > 3/10.   Baseline Pain rises to 5-6/10 with prolonged standing.   Time 6   Period Weeks   Status New                Plan - 07/21/14 1137    Clinical Impression Statement The patient fell on 03/12/14 (weather related) and sustained a very serious left knee injury.  She underwent a left knee ACL reconstrucion and meniscal repair surgery on 05/20/14.  Her resting pain-level is a 4-5/10 and higher when walking and standing for longer periods of time.   Pt will benefit from skilled therapeutic intervention in order to improve on the following deficits Pain;Decreased activity tolerance;Decreased strength;Decreased range of motion   PT Frequency 2x / week   PT Duration 6 weeks   PT Treatment/Interventions ADLs/Self Care Home Management;Electrical Stimulation;Ultrasound;Cryotherapy;Therapeutic activities;Therapeutic exercise;Neuromuscular re-education;Patient/family education;Manual techniques;Passive range of motion   PT Next Visit Plan Nustep; ACL reconstruction protocol.  No active extension ([facility]) left knee 0-30.   Consulted and Agree with Plan of Care Patient         Problem List Patient Active Problem List   Diagnosis Date Noted  . Smoker 05/13/2014  . Post-operative state 02/22/2013  . Elevated liver function tests 09/24/2012  . 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 History of recurrent UTI (urinary tract infection) 07/23/2012  . Obesity, morbid, BMI 50 or higher 07/23/2012  . H/O preterm delivery, currently pregnant 07/23/2012    Jazlin Tapscott, [place] MPT 07/21/2014, 11:48 AM  [facility] [address] [place], [place], [address] Phone: [phone]   Fax:  [phone]

## 2014-07-28 ENCOUNTER — Encounter: Payer: Medicaid Other | Admitting: Physical Therapy

## 2014-11-25 ENCOUNTER — Ambulatory Visit (INDEPENDENT_AMBULATORY_CARE_PROVIDER_SITE_OTHER): Payer: Medicaid Other | Admitting: Family Medicine

## 2014-11-25 ENCOUNTER — Encounter: Payer: Self-pay | Admitting: Family Medicine

## 2014-11-25 VITALS — BP 109/76 | HR 89 | Temp 98.1°F | Ht 69.0 in | Wt 368.0 lb

## 2014-11-25 DIAGNOSIS — R42 Dizziness and giddiness: Secondary | ICD-10-CM

## 2014-11-25 DIAGNOSIS — H8110 Benign paroxysmal vertigo, unspecified ear: Secondary | ICD-10-CM

## 2014-11-25 MED ORDER — OXYCODONE-ACETAMINOPHEN 5-325 MG PO TABS: 1.0000 | ORAL_TABLET | ORAL | Status: DC | PRN

## 2014-11-25 MED ORDER — OMEPRAZOLE 40 MG PO CPDR: 40.0000 mg | DELAYED_RELEASE_CAPSULE | Freq: Every day | ORAL | Status: DC

## 2014-11-25 MED ORDER — MECLIZINE HCL 32 MG PO TABS: 32.0000 mg | ORAL_TABLET | Freq: Three times a day (TID) | ORAL | Status: DC | PRN

## 2014-11-25 NOTE — Patient Instructions (Signed)

## 2014-11-25 NOTE — Progress Notes (Signed)
   Subjective:    Patient ID: Alice [name], female    DOB: [birth year], 29 y.o.   MRN: [id number]  HPI 29 year old female with vertigo. She has a history of this and seems to relate to some kidney infection. 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 I'm not familiar with the syndrome that she quoted but the last time she had vertigo she did have an infection so we plan to check and do a urine culture today.    Review of Systems  Constitutional: Negative.   HENT: Negative.   Respiratory: Negative.   Cardiovascular: Negative.   Genitourinary: Negative.   Neurological: Positive for dizziness.  Psychiatric/Behavioral: Negative.        Patient Active Problem List   Diagnosis Date Noted  . Smoker 05/13/2014  . Post-operative state 02/22/2013  . Elevated liver function tests 09/24/2012  . 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 History of recurrent UTI (urinary tract infection) 07/23/2012  . Obesity, morbid, BMI 50 or higher ([facility]CC) 07/23/2012  . H/O preterm delivery, currently pregnant 07/23/2012   Outpatient Encounter Prescriptions as of 11/25/2014  Medication Sig  . omeprazole (PRILOSEC) 40 MG capsule Take 40 mg by mouth daily.  Marland Kitchen oxyCODONE-acetaminophen (ROXICET) 5-325 MG per tablet Take 1 tablet by mouth every 4 (four) hours as needed for severe pain.   No facility-administered encounter medications on file as of 11/25/2014.    Objective:   Physical Exam  Constitutional: She is oriented to person, place, and time. She appears well-developed and well-nourished.  Morbid obesity  Neurological: She is alert and oriented to person, place, and time. No cranial nerve deficit. She exhibits normal muscle tone. Coordination normal.  Difficulty with tandem gait but Romberg is negative          Assessment & Plan:  1. Dizzy spells I would not normally do urine culture were dizzy spells but given her history I think that  is appropriate - Urine culture  2. Benign paroxysmal positional vertigo, unspecified laterality Rx for Antivert 32 mg 3 times a day when necessary dizziness  [name] MD

## 2014-11-27 LAB — URINE CULTURE

## 2014-12-01 NOTE — Therapy (Signed)
[facility] [place], [place], [address] Phone: [phone]   Fax:  [phone]  Physical Therapy Treatment  Patient Details  Name: Alice [name] MRN: [id number] Date of Birth: [birth year] No Data Recorded  Encounter Date: 07/21/2014    Past Medical History  Diagnosis Date  . History of kidney stones   . 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 History of recurrent UTIs   . Round ligament pain 09/10/2012  . Morbid obesity ([place])   . GERD (gastroesophageal reflux disease)   . Preterm labor   . Congenital obstruction of ureteropelvic junction (UPJ)   . Torn ACL     Past Surgical History  Procedure Laterality Date  . Nephrectomy      right side  . Nasal sinus surgery    . Tonsillectomy    . Other surgical history      ulner nerve removal  . Addenoidectomy    . Cesarean section N/A 02/22/2013    Procedure: CESAREAN SECTION/TWINS;  Surgeon: Florian Buff, MD;  Location: [facility];  Service: Obstetrics;  Laterality: N/A;    There were no vitals filed for this visit.  Visit Diagnosis:  Left knee pain - Plan: PT plan of care cert/re-cert  Knee stiffness, left - Plan: PT plan of care cert/re-cert                                    PT Long Term Goals - 07/21/14 1142    PT LONG TERM GOAL #1   Title Ind with HEP.   Baseline No knowledge of appropriate ther ex.   Time 6   Period Weeks   Status New   PT LONG TERM GOAL #2   Title Perform ADL's with pain not > 2-3/10.   Baseline Pain will rise to 5-6/10.   Time 6   Period Weeks   Status New   PT LONG TERM GOAL #3   Title Walk a commnity distance without antalgia and pain not > 3/10.   Baseline Current gait is antalgic.   Time 6   Period Weeks   Status New   PT LONG TERM GOAL #4   Title Stand 30 minutes with pain not > 3/10.   Baseline Pain rises to 5-6/10 with prolonged standing.   Time 6   Period Weeks   Status New               Problem List Patient Active Problem List   Diagnosis Date Noted  . Smoker 05/13/2014  . Post-operative state 02/22/2013  . Elevated liver function tests 09/24/2012  . 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 History of recurrent UTI (urinary tract infection) 07/23/2012  . Obesity, morbid, BMI 50 or higher ([place]) 07/23/2012  . H/O preterm delivery, currently pregnant 07/23/2012   PHYSICAL THERAPY DISCHARGE SUMMARY  Visits from Start of Care: 1  Current functional level related to goals / functional outcomes: Please see above.   Remaining deficits: Patient did not return to PT.   Education / Equipment:  Plan: Patient agrees to discharge.  Patient goals were not met. Patient is being discharged due to meeting the stated rehab goals.  ?????       Soledad Budreau, [place] MPT 12/01/2014, 5:53 PM  [facility] [address] [place], [place], [address] Phone: [phone]   Fax:  [phone]  Name:  Alice [name] MRN: [id number] Date of Birth: 1986-02-06

## 2014-12-04 ENCOUNTER — Telehealth: Payer: Self-pay | Admitting: Family Medicine

## 2014-12-04 NOTE — Progress Notes (Signed)
Patient aware.

## 2014-12-05 NOTE — Telephone Encounter (Signed)
Patient aware of results.

## 2014-12-30 ENCOUNTER — Encounter: Payer: Self-pay | Admitting: Family Medicine

## 2014-12-30 ENCOUNTER — Ambulatory Visit (INDEPENDENT_AMBULATORY_CARE_PROVIDER_SITE_OTHER): Payer: Medicaid Other | Admitting: Family Medicine

## 2014-12-30 VITALS — BP 155/99 | HR 85 | Temp 97.2°F | Ht 69.0 in | Wt 369.4 lb

## 2014-12-30 DIAGNOSIS — F39 Unspecified mood [affective] disorder: Secondary | ICD-10-CM | POA: Insufficient documentation

## 2014-12-30 MED ORDER — VENLAFAXINE HCL 75 MG PO TABS: 75.0000 mg | ORAL_TABLET | Freq: Two times a day (BID) | ORAL | Status: DC

## 2014-12-30 NOTE — Patient Instructions (Signed)
Great to meet you!  Lets follow up in 2 weeks  Consider going to [facility]  Taking the medicine as directed and not missing any doses is one of the best things you can do to treat your depression.  Here are some things to keep in mind:  1) Side effects (stomach upset, some increased anxiety) may happen before you notice a benefit.  These side effects typically go away over time. 2) Changes to your dose of medicine or a change in medication all together is sometimes necessary 3) Most people need to be on medication at least 6-12 months 4) Many people will notice an improvement within two weeks but the full effect of the medication can take up to 4-6 weeks 5) Stopping the medication when you start feeling better often results in a return of symptoms 6) If you start having thoughts of hurting yourself or others after starting this medicine, please call 911 immediately
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
## 2015-01-13 ENCOUNTER — Ambulatory Visit: Payer: Medicaid Other | Admitting: Family Medicine

## 2015-01-16 ENCOUNTER — Emergency Department (HOSPITAL_COMMUNITY)
Admission: EM | Admit: 2015-01-16 | Discharge: 2015-01-16 | Disposition: A | Discharge status: Home / Self Care | Payer: Medicaid Other | Attending: Emergency Medicine | Admitting: Emergency Medicine

## 2015-01-16 ENCOUNTER — Encounter (HOSPITAL_COMMUNITY): Payer: Self-pay | Admitting: Emergency Medicine

## 2015-01-16 ENCOUNTER — Ambulatory Visit: Payer: Medicaid Other | Admitting: Family Medicine

## 2015-01-16 ENCOUNTER — Emergency Department (HOSPITAL_COMMUNITY): Payer: Medicaid Other

## 2015-01-16 DIAGNOSIS — Z79899 Other long term (current) drug therapy: Secondary | ICD-10-CM | POA: Diagnosis not present

## 2015-01-16 DIAGNOSIS — M545 Low back pain: Secondary | ICD-10-CM | POA: Diagnosis not present

## 2015-01-16 DIAGNOSIS — Z3202 Encounter for pregnancy test, result negative: Secondary | ICD-10-CM | POA: Insufficient documentation

## 2015-01-16 DIAGNOSIS — R509 Fever, unspecified: Secondary | ICD-10-CM | POA: Insufficient documentation

## 2015-01-16 DIAGNOSIS — Z87828 Personal history of other (healed) physical injury and trauma: Secondary | ICD-10-CM | POA: Diagnosis not present

## 2015-01-16 DIAGNOSIS — Z87440 Personal history of urinary (tract) infections: Secondary | ICD-10-CM | POA: Insufficient documentation

## 2015-01-16 DIAGNOSIS — Z87442 Personal history of urinary calculi: Secondary | ICD-10-CM | POA: Diagnosis not present

## 2015-01-16 DIAGNOSIS — F17210 Nicotine dependence, cigarettes, uncomplicated: Secondary | ICD-10-CM | POA: Diagnosis not present

## 2015-01-16 DIAGNOSIS — R109 Unspecified abdominal pain: Secondary | ICD-10-CM | POA: Insufficient documentation

## 2015-01-16 DIAGNOSIS — R319 Hematuria, unspecified: Secondary | ICD-10-CM | POA: Diagnosis not present

## 2015-01-16 DIAGNOSIS — R197 Diarrhea, unspecified: Secondary | ICD-10-CM | POA: Insufficient documentation

## 2015-01-16 DIAGNOSIS — G8929 Other chronic pain: Secondary | ICD-10-CM

## 2015-01-16 DIAGNOSIS — K219 Gastro-esophageal reflux disease without esophagitis: Secondary | ICD-10-CM | POA: Insufficient documentation

## 2015-01-16 HISTORY — DX: Low back pain, unspecified: M54.50

## 2015-01-16 HISTORY — DX: Unspecified abdominal pain: R10.9

## 2015-01-16 HISTORY — DX: Hematuria, unspecified: R31.9

## 2015-01-16 HISTORY — DX: Low back pain: M54.5

## 2015-01-16 HISTORY — DX: Other chronic pain: G89.29

## 2015-01-16 LAB — CBC WITH DIFFERENTIAL/PLATELET
BASOS PCT: 0 %
Basophils Absolute: 0 10*3/uL (ref 0.0–0.1)
EOS ABS: 0.5 10*3/uL (ref 0.0–0.7)
Eosinophils Relative: 4 %
HEMATOCRIT: 43.9 % (ref 36.0–46.0)
HEMOGLOBIN: 14.9 g/dL (ref 12.0–15.0)
LYMPHS ABS: 3 10*3/uL (ref 0.7–4.0)
Lymphocytes Relative: 26 %
MCH: 29.7 pg (ref 26.0–34.0)
MCHC: 33.9 g/dL (ref 30.0–36.0)
MCV: 87.6 fL (ref 78.0–100.0)
MONO ABS: 0.9 10*3/uL (ref 0.1–1.0)
MONOS PCT: 8 %
NEUTROS ABS: 6.9 10*3/uL (ref 1.7–7.7)
NEUTROS PCT: 62 %
Platelets: 287 10*3/uL (ref 150–400)
RBC: 5.01 MIL/uL (ref 3.87–5.11)
RDW: 13.4 % (ref 11.5–15.5)
WBC: 11.3 10*3/uL — ABNORMAL HIGH (ref 4.0–10.5)

## 2015-01-16 LAB — URINALYSIS, ROUTINE W REFLEX MICROSCOPIC
BILIRUBIN URINE: NEGATIVE
GLUCOSE, UA: NEGATIVE mg/dL
Hgb urine dipstick: NEGATIVE
KETONES UR: NEGATIVE mg/dL
Nitrite: NEGATIVE
PH: 5.5 (ref 5.0–8.0)
Protein, ur: NEGATIVE mg/dL
SPECIFIC GRAVITY, URINE: 1.025 (ref 1.005–1.030)

## 2015-01-16 LAB — HEPATIC FUNCTION PANEL
ALT: 47 U/L (ref 14–54)
AST: 33 U/L (ref 15–41)
Albumin: 3.8 g/dL (ref 3.5–5.0)
Alkaline Phosphatase: 81 U/L (ref 38–126)
TOTAL PROTEIN: 7.4 g/dL (ref 6.5–8.1)
Total Bilirubin: 0.4 mg/dL (ref 0.3–1.2)

## 2015-01-16 LAB — LACTIC ACID, PLASMA
LACTIC ACID, VENOUS: 0.7 mmol/L (ref 0.5–2.0)
Lactic Acid, Venous: 1.1 mmol/L (ref 0.5–2.0)

## 2015-01-16 LAB — BASIC METABOLIC PANEL
Anion gap: 9 (ref 5–15)
BUN: 21 mg/dL — AB (ref 6–20)
CALCIUM: 9 mg/dL (ref 8.9–10.3)
CHLORIDE: 104 mmol/L (ref 101–111)
CO2: 25 mmol/L (ref 22–32)
CREATININE: 1 mg/dL (ref 0.44–1.00)
GFR calc non Af Amer: >60 mL/min (ref >60)
Glucose, Bld: 90 mg/dL (ref 65–99)
Potassium: 4.2 mmol/L (ref 3.5–5.1)
Sodium: 138 mmol/L (ref 135–145)

## 2015-01-16 LAB — PREGNANCY, URINE: PREG TEST UR: NEGATIVE

## 2015-01-16 LAB — LIPASE, BLOOD: LIPASE: 43 U/L (ref 11–51)

## 2015-01-16 LAB — URINE MICROSCOPIC-ADD ON: RBC / HPF: NONE SEEN RBC/hpf (ref 0–5)

## 2015-01-16 MED ORDER — TRAMADOL HCL 50 MG PO TABS: 50.0000 mg | ORAL_TABLET | Freq: Four times a day (QID) | ORAL | Status: DC | PRN

## 2015-01-16 MED ORDER — ONDANSETRON HCL 4 MG/2ML IJ SOLN
4.0000 mg | INTRAMUSCULAR | Status: AC | PRN
  Administered 2015-01-16 (×2): 4 mg via INTRAVENOUS
  Filled 2015-01-16 (×2): qty 2

## 2015-01-16 MED ORDER — SODIUM CHLORIDE 0.9 % IV BOLUS (SEPSIS)
500.0000 mL | Freq: Once | INTRAVENOUS | Status: AC
  Administered 2015-01-16: 500 mL via INTRAVENOUS

## 2015-01-16 MED ORDER — SODIUM CHLORIDE 0.9 % IV SOLN
INTRAVENOUS | Status: DC
  Administered 2015-01-16: 16:00:00 via INTRAVENOUS

## 2015-01-16 MED ORDER — MORPHINE SULFATE (PF) 4 MG/ML IV SOLN
4.0000 mg | INTRAVENOUS | Status: AC | PRN
  Administered 2015-01-16 (×2): 4 mg via INTRAVENOUS
  Filled 2015-01-16 (×2): qty 1

## 2015-01-16 NOTE — ED Provider Notes (Signed)
CSN: [id number]     Arrival date & time 01/16/15  1218 History   First MD Initiated Contact with Patient 01/16/15 1505     Chief Complaint  Patient presents with  . Flank Pain  . Diarrhea  . Emesis      HPI Pt was seen at 1515. Per pt, c/o gradual onset and persistence of constant left sided low back "pain" for the past 3 weeks. Pain worsens with palpation of the area and body position changes. Has been associated with multiple intermittent episodes of N/V/D as well as hematuria for the past 3 weeks. States she has had "low grade fevers" also. Pt states she was evaluated by her PMD 2 weeks ago for these symptoms and was told to f/u with her Uro MD. Pt states her Uro MD then told her to come to the ED today.  Denies incont/retention of bowel or bladder, no saddle anesthesia, no focal motor weakness, no tingling/numbness in extremities, no fevers, no injury, no abd pain.      Past Medical History  Diagnosis Date  . History of kidney stones   . 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 History of recurrent UTIs   . Round ligament pain 09/10/2012  . Morbid obesity (HCC)   . GERD (gastroesophageal reflux disease)   . Preterm labor   . Congenital obstruction of ureteropelvic junction (UPJ)   . Torn ACL   . Chronic pain   . Loin pain hematuria syndrome     Dr. [name] at [facility]  . Chronic left flank pain    Past Surgical History  Procedure Laterality Date  . Nephrectomy      right side  . Nasal sinus surgery    . Tonsillectomy    . Other surgical history      ulner nerve removal  . Addenoidectomy    . Cesarean section N/A 02/22/2013    Procedure: CESAREAN SECTION/TWINS;  Surgeon: [name], MD;  Location: [facility];  Service: Obstetrics;  Laterality: N/A;   Family History  Problem Relation Age of Onset  . Hypertension Mother   . Hyperlipidemia Mother   . Heart disease Father     MI  . Alcohol abuse Father   . Alcohol abuse Brother   . Diabetes Maternal  Grandmother   . Hyperlipidemia Maternal Grandmother    Social History  Substance Use Topics  . Smoking status: Current Every Day Smoker -- 0.50 packs/day for .5 years    Types: Cigarettes    Last Attempt to Quit: 06/21/2012  . Smokeless tobacco: Never Used  . Alcohol Use: No   OB History    Gravida Para Term Preterm AB TAB SAB Ectopic Multiple Living   Review of Systems ROS: Statement: All systems negative except as marked or noted in the HPI; Constitutional: +"low grade fevers." Negative for chills. ; ; Eyes: Negative for eye pain, redness and discharge. ; ; ENMT: Negative for ear pain, hoarseness, nasal congestion, sinus pressure and sore throat. ; ; Cardiovascular: Negative for chest pain, palpitations, diaphoresis, dyspnea and peripheral edema. ; ; Respiratory: Negative for cough, wheezing and stridor. ; ; Gastrointestinal: +N/V/D. Negative for blood in stool, hematemesis, jaundice and rectal bleeding. . ; ; Genitourinary: +hematuria. Negative for dysuria. ; ; Musculoskeletal: +LBP. Negative for neck pain. Negative for swelling and trauma.; ; Skin: Negative for pruritus, rash, abrasions, blisters, bruising and skin  lesion.; ; Neuro: Negative for headache, lightheadedness and neck stiffness. Negative for weakness, altered level of consciousness , altered mental status, extremity weakness, paresthesias, involuntary movement, seizure and syncope.      Allergies  Cinnamon flavor; Codeine; Flomax; Ibuprofen; Levofloxacin; and Reglan  Home Medications   Prior to Admission medications   Medication Sig Start Date End Date Taking? Authorizing Provider  acetaminophen (TYLENOL) 500 MG tablet Take 1,000 mg by mouth every 6 (six) hours as needed for fever.   Yes Historical Provider, MD  omeprazole (PRILOSEC) 40 MG capsule Take 1 capsule (40 mg total) by mouth daily. 11/25/14  Yes [name], MD  venlafaxine (EFFEXOR) 75 MG tablet Take 1 tablet (75 mg total) by mouth 2  (two) times daily. 12/30/14  Yes [name], MD  oxyCODONE-acetaminophen (ROXICET) 5-325 MG tablet Take 1 tablet by mouth every 4 (four) hours as needed for severe pain. Patient not taking: Reported on 01/16/2015 11/25/14   [name], MD   BP 135/101 mmHg  Pulse 104  Temp(Src) 98 F (36.7 C) (Oral)  Resp 16  Ht 5\' 9"  (1.753 m)  Wt 365 lb (165.563 kg)  BMI 53.88 kg/m2  SpO2 98%  LMP 01/05/2015   Filed Vitals:   01/16/15 1630 01/16/15 1700 01/16/15 1730 01/16/15 1800  BP: 123/86 137/79 153/90 139/93  Pulse: 78 92 78 80  Temp:      TempSrc:      Resp: 14   16  Height:      Weight:      SpO2: 100% 98% 100% 100%      Physical Exam  1520: Physical examination:  Nursing notes reviewed; Vital signs and O2 SAT reviewed;  Constitutional: Well developed, Well nourished, Well hydrated, In no acute distress; Head:  Normocephalic, atraumatic; Eyes: EOMI, PERRL, No scleral icterus; ENMT: Mouth and pharynx normal, Mucous membranes moist; Neck: Supple, Full range of motion, No lymphadenopathy; Cardiovascular: Regular rate and rhythm, No gallop; Respiratory: Breath sounds clear & equal bilaterally, No rales, rhonchi, wheezes.  Speaking full sentences with ease, Normal respiratory effort/excursion; Chest: Nontender, Movement normal; Abdomen: Soft, Nontender, Nondistended, Normal bowel sounds; Genitourinary: No CVA tenderness; Spine:  No midline CS, TS, LS tenderness. +TTP left lumbar paraspinal muscles.;; Extremities: Pulses normal, No tenderness, No edema, No calf edema or asymmetry.; Neuro: AA&Ox3, Major CN grossly intact.  Speech clear. No gross focal motor or sensory deficits in extremities.; Skin: Color normal, Warm, Dry.   ED Course  Procedures (including critical care time) Labs Review   Imaging Review  I have personally reviewed and evaluated these images and lab results as part of my medical decision-making.   EKG Interpretation None      MDM  MDM Reviewed:  previous chart, nursing note and vitals Reviewed previous: labs Interpretation: labs and CT scan      Results for orders placed or performed during the hospital encounter of 01/16/15  Urinalysis, Routine w reflex microscopic (not at [facility])  Result Value Ref Range   Color, Urine YELLOW YELLOW   APPearance CLEAR CLEAR   Specific Gravity, Urine 1.025 1.005 - 1.030   pH 5.5 5.0 - 8.0   Glucose, UA NEGATIVE NEGATIVE mg/dL   Hgb urine dipstick NEGATIVE NEGATIVE   Bilirubin Urine NEGATIVE NEGATIVE   Ketones, ur NEGATIVE NEGATIVE mg/dL   Protein, ur NEGATIVE NEGATIVE mg/dL   Nitrite NEGATIVE NEGATIVE   Leukocytes, UA SMALL (A) NEGATIVE  CBC with Differential  Result Value Ref Range   WBC  11.3 (H) 4.0 - 10.5 K/uL   RBC 5.01 3.87 - 5.11 MIL/uL   Hemoglobin 14.9 12.0 - 15.0 g/dL   HCT 16.143.9 09.636.0 - 04.546.0 %   MCV 87.6 78.0 - 100.0 fL   MCH 29.7 26.0 - 34.0 pg   MCHC 33.9 30.0 - 36.0 g/dL   RDW 40.913.4 81.111.5 - 91.415.5 %   Platelets 287 150 - 400 K/uL   Neutrophils Relative % 62 %   Neutro Abs 6.9 1.7 - 7.7 K/uL   Lymphocytes Relative 26 %   Lymphs Abs 3.0 0.7 - 4.0 K/uL   Monocytes Relative 8 %   Monocytes Absolute 0.9 0.1 - 1.0 K/uL   Eosinophils Relative 4 %   Eosinophils Absolute 0.5 0.0 - 0.7 K/uL   Basophils Relative 0 %   Basophils Absolute 0.0 0.0 - 0.1 K/uL  Basic metabolic panel  Result Value Ref Range   Sodium 138 135 - 145 mmol/L   Potassium 4.2 3.5 - 5.1 mmol/L   Chloride 104 101 - 111 mmol/L   CO2 25 22 - 32 mmol/L   Glucose, Bld 90 65 - 99 mg/dL   BUN 21 (H) 6 - 20 mg/dL   Creatinine, Ser 7.821.00 0.44 - 1.00 mg/dL   Calcium 9.0 8.9 - 95.610.3 mg/dL   GFR calc non Af Amer >60 >60 mL/min   GFR calc Af Amer >60 >60 mL/min   Anion gap 9 5 - 15  Pregnancy, urine  Result Value Ref Range   Preg Test, Ur NEGATIVE NEGATIVE  Lactic acid, plasma  Result Value Ref Range   Lactic Acid, Venous 1.1 0.5 - 2.0 mmol/L  Lactic acid, plasma  Result Value Ref Range   Lactic Acid, Venous 0.7  0.5 - 2.0 mmol/L  Hepatic function panel  Result Value Ref Range   Total Protein 7.4 6.5 - 8.1 g/dL   Albumin 3.8 3.5 - 5.0 g/dL   AST 33 15 - 41 U/L   ALT 47 14 - 54 U/L   Alkaline Phosphatase 81 38 - 126 U/L   Total Bilirubin 0.4 0.3 - 1.2 mg/dL   Bilirubin, Direct <2.1<0.1 (L) 0.1 - 0.5 mg/dL   Indirect Bilirubin NOT CALCULATED 0.3 - 0.9 mg/dL  Lipase, blood  Result Value Ref Range   Lipase 43 11 - 51 U/L  Urine microscopic-add on  Result Value Ref Range   Squamous Epithelial / LPF 6-30 (A) NONE SEEN   WBC, UA 6-30 0 - 5 WBC/hpf   RBC / HPF NONE SEEN 0 - 5 RBC/hpf   Bacteria, UA FEW (A) NONE SEEN   Ct Abdomen Pelvis Wo Contrast 01/16/2015  CLINICAL DATA:  Nausea, vomiting, diarrhea for 3 weeks. EXAM: CT ABDOMEN AND PELVIS WITHOUT CONTRAST TECHNIQUE: Multidetector CT imaging of the abdomen and pelvis was performed following the standard protocol without IV contrast. COMPARISON:  09/09/2013 FINDINGS: Lower chest:  Clear lung bases.  Normal heart size. Hepatobiliary: Normal liver. Cholelithiasis near the gallbladder neck. Pancreas: Normal. Spleen: Normal. Adrenals/Urinary Tract: Normal adrenal glands. Prior right nephrectomy. Normal left kidney. No obstructive uropathy or nephrolithiasis. Stomach/Bowel: No bowel wall thickening or dilatation. No pneumatosis, pneumoperitoneum or portal venous gas. Normal appendix. No abdominal or pelvic free fluid. Vascular/Lymphatic: Normal caliber abdominal aorta. No abdominal or pelvic lymphadenopathy. Reproductive: Normal uterus.  No adnexal mass. Other: No fluid collection or hematoma. Musculoskeletal: No acute osseous abnormality. No lytic or sclerotic lesion. IMPRESSION: 1. Cholelithiasis near the gallbladder neck. 2. Prior right nephrectomy. 3. No left  nephrolithiasis or obstructive uropathy. Electronically Signed   By: Elige Ko   On: 01/16/2015 18:54    1900:  Workup reassuring. Udip appears contaminated; UC pending. Pt has tol PO well while in the ED  without N/V.  No stooling while in the ED.  Abd remains benign, VSS. Pt wants to go home now. Tx symptomatically at this time; encouraged to f/u with her Uro MD, PMD, or Pain Management doctor for good continuity care and control of her chronic pain. Pt requesting "some pain meds to get me through until Monday." Explained to pt that the ED is not the venue to obtain rx of her chronic narcotic pain meds; pt verb understanding.  Dx and testing d/w pt and family.  Questions answered.  Verb understanding, agreeable to d/c home with outpt f/u.   Samuel Jester, DO 01/19/15 2230

## 2015-01-16 NOTE — Discharge Instructions (Signed)
°Emergency Department Resource Guide °1) Find a Doctor and Pay Out of Pocket °Although you won't have to find out who is covered by your insurance plan, it is a good idea to ask around and get recommendations. You will then need to call the office and see if the doctor you have chosen will accept you as a new patient and what types of options they offer for patients who are self-pay. Some doctors offer discounts or will set up payment plans for their patients who do not have insurance, but you will need to ask so you aren't surprised when you get to your appointment. ° °2) Contact Your Local Health Department °Not all health departments have doctors that can see patients for sick visits, but many do, so it is worth a call to see if yours does. If you don't know where your local health department is, you can check in your phone book. The CDC also has a tool to help you locate your state's health department, and many state websites also have listings of all of their local health departments. ° °3) Find a Walk-in Clinic °If your illness is not likely to be very severe or complicated, you may want to try a walk in clinic. These are popping up all over the country in pharmacies, drugstores, and shopping centers. They're usually staffed by nurse practitioners or physician assistants that have been trained to treat common illnesses and complaints. They're usually fairly quick and inexpensive. However, if you have serious medical issues or chronic medical problems, these are probably not your best option. ° °No Primary Care Doctor: °- Call [facility] at  832-8000 - they can help you locate a primary care doctor that  accepts your insurance, provides certain services, etc. °- Physician Referral Service- 1-800-533-3463 ° °Chronic Pain Problems: °Organization         Address  Phone   Notes  °[facility]  ([phone]) [phone] Patients need to be referred by their primary care doctor.  ° °Medication  Assistance: °Organization         Address  Phone   Notes  °[place] [facility] [address] °[place], [place] [address] (336) 641-8030 --Must be a resident of [place] °-- Must have NO insurance coverage whatsoever (no Medicaid/ Medicare, etc.) °-- The pt. MUST have a primary care doctor that directs their care regularly and follows them in the community °  °[facility]  (866) 331-1348   °[employer]  (888) 892-1162   ° °Agencies that provide inexpensive medical care: °Organization         Address  Phone   Notes  °[facility]  (336) 832-8035   °[facility]    (336) 832-7272   °[facility] [address] °[place], [place] [address] (336) 832-4777   °[facility] [address], °[place] (336) 271-4999   °[facility]    (336) 373-0678   °[facility]    (336) 272-1050   °[facility] ° [address], [place] Phone:  (336) 832-4444, Fax:  (336) 832-4440 Hours of Operation:  9 am - 6 pm, M-F.  Also accepts Medicaid/Medicare and self-pay.  °[facility] ° [address], [place] Phone: (336) [phone], Fax: (336) [phone]. Hours of Operation:  8:30 am - 5:30 pm, M-F.  Also accepts Medicaid and self-pay.  °[facility] [place] [address], [place] Phone: (336) 878-6027   °[facility] [address], [place], [place] (336)723-1848, Ext. 123 Mondays & Thursdays: 7-9 AM.  First 15 patients are seen on a first come, first serve basis. °  ° °Medicaid-accepting [place] Providers: ° °Organization         Address  Phone   Notes  °[facility] [address], [place] (336) 641-2100 Also accepts self-pay patients.  °[facility] [address], [place] ° (336) 856-9996   °[facility] [address], [place]  (336) 288-8857   °[facility] [address] [place] [address], [place] (336) 299-7000   °Veita Bland [address], [place]  ° (336) [phone] Only accepts [place] Access Medicaid patients after they have their name applied to their card.  ° °Self-Pay (no insurance) in [place]: ° °Organization         Address  Phone   Notes  °Sickle Cell Patients, [facility] [address], [place] (336) 832-1970   °[facility] [address], [place] (336) 832-4400   ° [facility] ° [address], [place] (336) 992-4800   °[facility]/Dr. Osei-Bonsu ° [address] [place] [address], [place] or [address], [place] (336) [phone] Phone number for both [place] and [place] locations is the same.  °[facility] [address], [place] (336) 299-0000   °[facility] [address] [place] [address], [place] or [address] (336) 852-7530 °(336) 878-2260   °[facility] [address],  (336) 350-1642, phone; (336) 294-5005, fax Sees patients 1st and 3rd Saturday of every month.  Must not qualify for public or private insurance (i.e. Medicaid, Medicare, [place] Health Choice, Veterans' Benefits) • Household income should be no more than 200% of the poverty level •The clinic cannot treat you if you are pregnant or think you are pregnant • Sexually transmitted diseases are not treated at the clinic.  ° ° °Dental Care: °Organization         Address  Phone  Notes  °[place] [facility] [address],  (336) 641-6152 Accepts children up to age 21 who are enrolled in Medicaid or [place] Health Choice; pregnant women with a Medicaid card; and children who have applied for Medicaid or [place] Health Choice, but were declined, whose parents can pay a reduced fee at time of service.  °[place]  [facility] [place]  [address], [place] (336) 641-7733 Accepts children up to age 21 who are enrolled in Medicaid or [place] Health Choice; pregnant women with a Medicaid card; and children who have applied for Medicaid or [place] Health Choice, but were declined, whose parents can pay a reduced fee at time of service.  °[facility] ° [address],  (336) 641-4533 Patients are seen by appointment only. Walk-ins are not accepted. [facility] will see patients 18 years of age and older. °Monday - Tuesday (8am-5pm) °Most Wednesdays (8:30-5pm) °$30 per visit, cash only  °[facility] ° [address], [place] (336) 641-4533 Patients are seen by appointment only. Walk-ins are not accepted. [facility] will see patients 18 years of age and older. °One   Wednesday Evening (Monthly: Volunteer Based).  $30 per visit, cash only  °[facility]  (919) 537-3737 for adults; Children under age 4, call Graduate Pediatric Dentistry at ([phone]) [phone]. Children aged 4-14, please call (919) [phone] to request a pediatric application. ° Dental services are provided in all areas of dental care including fillings, crowns and bridges, complete and partial dentures, implants, gum treatment, root canals, and extractions. Preventive care is also provided. Treatment is provided to both adults and children. °Patients are selected via a lottery and there is often a waiting list. °  °[facility] [address], °[place] ° (336) 763-8833 [email or web address] °  °[facility] [address], [place], [place] (336)723-1848, Ext. 123 Second and Fourth Thursday of each month, opens at 6:30 AM; Clinic ends at 9 AM.  Patients are seen on a first-come first-served basis, and a limited number are seen during each clinic.  ° °[facility] ° [address], [place], [place] (336) 723-7904    Eligibility Requirements °You must have lived in [place], [place], or [place] counties for at least the last three months. °  You cannot be eligible for state or federal sponsored healthcare insurance, including Veterans Administration, Medicaid, or Medicare. °  You generally cannot be eligible for healthcare insurance through your employer.  °  How to apply: °Eligibility screenings are held every Tuesday and Wednesday afternoon from 1:00 pm until 4:00 pm. You do not need an appointment for the interview!  °[facility] [address], [place], [place] 336-631-2330   °[place] [facility]  336-342-8273   °[place]  336-703-3100   °[facility]  336-570-6415   ° °Behavioral Health Resources in the Community: °Intensive Outpatient Programs °Organization         Address  Phone  Notes  °[facility] [address], [place], [place] 336-878-6098   °[facility] [address], [place], [place] 336-832-9800   °[facility] [address], [place], [place] ° 336-882-2125   °[facility] [address],  °[place], [place] 1-800-853-5163 or 336-641-4981   °Substance Abuse Resources °Organization         Address  Phone  Notes  °[facility]  336-882-2125   °[facility]  336-784-9470   °[facility]  336-285-9073   °[facility]  336-845-3988   °Residential & Outpatient Substance Abuse Program  1-800-659-3381   °Psychological Services °Organization         Address  Phone  Notes  °[facility]  336- 832-9600   °[facility]  336- 378-7881   °[facility] [address], [place] 1-800-853-5163 or 336-641-4981   ° °Mobile Crisis Teams °Organization         Address  Phone  Notes  °[facility], Mobile Crisis Care Unit  1-877-626-1772   °Assertive °[facility] ° [address]  [place], [place] 336-834-9664   °Sharon DeEsch [address] °[place] 336-554-5454   ° °Self-Help/Support Groups °Organization         Address  Phone             Notes  °[facility]  - variety of support groups  336- 373-1402 Call for more information  °Narcotics Anonymous (NA), [facility] [address], °[place]  2 meetings at this location  ° °  Residential Treatment Programs °Organization         Address  Phone  Notes  °[facility] [address],    °[place]  1-866-801-8205   °[facility] ° [address], [place], [place] 704-293-8524   °[facility] [address], [place] 336-845-3988 Admissions: 8am-3pm M-F  °[facility] [address]    °[place], [place] 336-841-1104   °[facility] [address], [place], [place] 336-379-7146   °[facility] [address]  °[place], [place] 336-285-9073   °[facility] Intensive Outpatient [address], [place], [place] 336-852-3033   °[facility] [address]  °[place], [place] 1-877-615-2722 or 336-784-9470   °[facility] [address] [place], [place] 336-227-7417 Accepts Medicaid  °[facility] [address]  °[place] 1-800-659-3381 Substance Abuse/Addiction Treatment  ° °[place] Behavioral Health Resources °Organization         Address  Phone  Notes  °[facility]  (888) 581-9988   °Julie Brannon, PhD [address] [place], [place]   (336) 349-5553 or (336) 951-0000   °[facility]   [address] °[place], [place] (336) 349-4454   °[facility] [address], [place], [place] (336) 342-8316 Insurance/Medicaid/sponsorship through [facility]  °[facility] [address]                                    [place], [place] (336) 342-8316 Therapy/tele-psych/case    °[facility] [address]  ° [place], [place] (336) 349-2233    °Dr. Arfeen  (336) 349-4544   °[facility] [place]  [employer] [place] [facility] 1) [address], [place] °2) [address], [place] °3)  [address], [place] (336) 349-3220 °(336) 342-7768 ° °(336) 342-8140   °[place] [facility] (336) 342-1394 or (336) 342-3537 (After Hours)    ° ° °Take the prescription as directed.  Apply moist heat or ice to the area(s) of discomfort, for 15 minutes at a time, several times per day for the next few days.  Do not fall asleep on a heating or ice pack.  Call your regular medical doctor on Monday to schedule a follow up appointment next week.  Return to the Emergency Department immediately if worsening. ° °

## 2015-01-16 NOTE — ED Notes (Signed)
Pt verbalized understanding of no driving and to use caution within 4 hours of taking pain meds due to meds cause drowsiness 

## 2015-01-16 NOTE — ED Notes (Signed)
Pt states that urologist sent her here due to symptoms of low grade fever, hematuria, and left flank pain

## 2015-01-19 ENCOUNTER — Ambulatory Visit (INDEPENDENT_AMBULATORY_CARE_PROVIDER_SITE_OTHER): Payer: Medicaid Other | Admitting: Family Medicine

## 2015-01-19 ENCOUNTER — Encounter: Payer: Self-pay | Admitting: Family Medicine

## 2015-01-19 VITALS — BP 149/93 | HR 90 | Temp 97.9°F | Ht 69.0 in | Wt 364.8 lb

## 2015-01-19 DIAGNOSIS — F39 Unspecified mood [affective] disorder: Secondary | ICD-10-CM

## 2015-01-19 LAB — URINE CULTURE

## 2015-01-19 NOTE — Progress Notes (Signed)
   HPI  Patient presents today  Follow-up for mood disorder.  Patient has started Effexor and feels like it's helping her mood some. It's only been 2 weeks since she sarted she is tolerating it overall very well.  she states that since she sarted her medcation she's had one to 2 episodes of loose stools daily usually associated with eating. She ha noabdominal cramping or upset stomach.  Sh is still having passive suicidal thoughts feeling like "she just doesn't want to be here " however she denies any specific plans toherself and states that se would never hurt herself or take herself away from her kids   hher blood pressure is up today, she states this is usually associated wih herrall problm. She hasan appointment o ee her urologist.  PMH: Smoking status noted ROS: Per HPI  Objective: BP 149/93 mmHg  Pulse 90  Temp(Src) 97.9 F (36.6 C) (Oral)  Ht 5\' 9"  (1.753 m)  Wt 364 lb 12.8 oz (165.472 kg)  BMI 53.85 kg/m2  LMP 01/05/2015 Gen: NAD, alert, cooperative with exam HEENT: NCAT  CV: RRR, good S1/S2, no murmur Resp: CTABL, no wheezes, non-labored Ext: No edema, warm Neuro: Alert and oriented, No gross deficits    Psych: Passive SI as above but denies suicidal thoughts currently and states tht she would never hurt herself, contracts for safety  Normal mood nd affect.  PHQ-9 15, 1 on #9,   Assessment and plan:  # mood disorder Mixed depression and anxiety, improving on Effexor She has had loose stools since starting Effexor but woill like to continue her current medication as she states that it se helping her. She states that her anxiety is becoming more clear since starting the medication but she does not feel like it has gotten worse. She contracts for safety but continues to have passive suicidal thoughts as above Follow-up 6-8 weeks If she cannot tolerate this due to loose stools I would consider changing her to Cymbalta  Murtis SinkSam Whitfield Dulay, MD [facility] 01/19/2015, 1:18 PM

## 2015-01-19 NOTE — Patient Instructions (Signed)
Great to see you!  Keep me in th eloop with your urologist, please have him send me notes  Come back in 6-8 weeks to discuss your mood  Taking the medicine as directed and not missing any doses is one of the best things you can do to treat your depression.  Here are some things to keep in mind:  1) Side effects (stomach upset, some increased anxiety) may happen before you notice a benefit.  These side effects typically go away over time. 2) Changes to your dose of medicine or a change in medication all together is sometimes necessary 3) Most people need to be on medication at least 6-12 months 4) Many people will notice an improvement within two weeks but the full effect of the medication can take up to 4-6 weeks 5) Stopping the medication when you start feeling better often results in a return of symptoms 6) If you start having thoughts of hurting yourself or others after starting this medicine, please call 911 immediately

## 2015-02-13 ENCOUNTER — Telehealth: Payer: Self-pay | Admitting: Family Medicine

## 2015-02-13 NOTE — Telephone Encounter (Signed)
Advised to take Delsym for cough. Patient stated that she has been taking for 3 days and it is not helping. States she is coughing up thick green phlegm. Advised patient she may need an antibiotic and she should go to Urgent Care. Patient verbalized understanding

## 2015-02-14 ENCOUNTER — Emergency Department (HOSPITAL_COMMUNITY)
Admission: EM | Admit: 2015-02-14 | Discharge: 2015-02-14 | Disposition: A | Discharge status: Home / Self Care | Payer: Medicaid Other | Attending: Emergency Medicine | Admitting: Emergency Medicine

## 2015-02-14 ENCOUNTER — Encounter (HOSPITAL_COMMUNITY): Payer: Self-pay | Admitting: Emergency Medicine

## 2015-02-14 DIAGNOSIS — R197 Diarrhea, unspecified: Secondary | ICD-10-CM | POA: Diagnosis not present

## 2015-02-14 DIAGNOSIS — R110 Nausea: Secondary | ICD-10-CM | POA: Diagnosis not present

## 2015-02-14 DIAGNOSIS — Z87440 Personal history of urinary (tract) infections: Secondary | ICD-10-CM | POA: Insufficient documentation

## 2015-02-14 DIAGNOSIS — K219 Gastro-esophageal reflux disease without esophagitis: Secondary | ICD-10-CM | POA: Diagnosis not present

## 2015-02-14 DIAGNOSIS — Z720 Tobacco use: Secondary | ICD-10-CM

## 2015-02-14 DIAGNOSIS — Z87828 Personal history of other (healed) physical injury and trauma: Secondary | ICD-10-CM | POA: Insufficient documentation

## 2015-02-14 DIAGNOSIS — R52 Pain, unspecified: Secondary | ICD-10-CM | POA: Diagnosis present

## 2015-02-14 DIAGNOSIS — J209 Acute bronchitis, unspecified: Secondary | ICD-10-CM | POA: Diagnosis not present

## 2015-02-14 DIAGNOSIS — Z79899 Other long term (current) drug therapy: Secondary | ICD-10-CM | POA: Diagnosis not present

## 2015-02-14 DIAGNOSIS — Z87442 Personal history of urinary calculi: Secondary | ICD-10-CM | POA: Insufficient documentation

## 2015-02-14 DIAGNOSIS — G8929 Other chronic pain: Secondary | ICD-10-CM | POA: Diagnosis not present

## 2015-02-14 DIAGNOSIS — Q6239 Other obstructive defects of renal pelvis and ureter: Secondary | ICD-10-CM | POA: Insufficient documentation

## 2015-02-14 DIAGNOSIS — F17210 Nicotine dependence, cigarettes, uncomplicated: Secondary | ICD-10-CM | POA: Diagnosis not present

## 2015-02-14 MED ORDER — DOXYCYCLINE HYCLATE 100 MG PO CAPS: 100.0000 mg | ORAL_CAPSULE | Freq: Two times a day (BID) | ORAL | Status: DC

## 2015-02-14 MED ORDER — AEROCHAMBER Z-STAT PLUS/MEDIUM MISC
1.0000 | Freq: Once | Status: AC
  Administered 2015-02-14: 1

## 2015-02-14 MED ORDER — HYDROCODONE-ACETAMINOPHEN 5-325 MG PO TABS: 2.0000 | ORAL_TABLET | ORAL | Status: DC | PRN

## 2015-02-14 MED ORDER — ALBUTEROL SULFATE HFA 108 (90 BASE) MCG/ACT IN AERS
2.0000 | INHALATION_SPRAY | RESPIRATORY_TRACT | Status: DC | PRN
  Administered 2015-02-14: 2 via RESPIRATORY_TRACT
  Filled 2015-02-14: qty 6.7

## 2015-02-14 NOTE — ED Notes (Signed)
Pt states that she has been having pain all over, cough, vomiting and diarrhea, and generally not feeling well x 3 days.

## 2015-02-14 NOTE — ED Provider Notes (Signed)
CSN: [id number]     Arrival date & time 02/14/15  1414 History   First MD Initiated Contact with Patient 02/14/15 1505     Chief Complaint  Patient presents with  . Generalized Body Aches     (Consider location/radiation/quality/duration/timing/severity/associated sxs/prior Treatment) HPI   Alice [name] is a 29 y.o. female who presents for evaluation of 3 day history of cough productive of sputum, green, with blood in it. She has mild shortness of breath. She also has nausea and frequent diarrhea, for 1 week. She denies fever, chills, back pain, weakness or dizziness. She called her doctor yesterday who advised her to go to an urgent care and get an antibiotic. She smokes cigarettes. She is not currently using bronchodilators. She's been taking ibuprofen and Tylenol, without relief of her myalgias. There are no other known modifying factors.   Past Medical History  Diagnosis Date  . History of kidney stones   . 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 History of recurrent UTIs   . Round ligament pain 09/10/2012  . Morbid obesity (HCC)   . GERD (gastroesophageal reflux disease)   . Preterm labor   . Congenital obstruction of ureteropelvic junction (UPJ)   . Torn ACL   . Chronic pain   . Loin pain hematuria syndrome     Dr. [name] at [facility]  . Chronic left flank pain    Past Surgical History  Procedure Laterality Date  . Nephrectomy      right side  . Nasal sinus surgery    . Tonsillectomy    . Other surgical history      ulner nerve removal  . Addenoidectomy    . Cesarean section N/A 02/22/2013    Procedure: CESAREAN SECTION/TWINS;  Surgeon: [name], MD;  Location: [facility];  Service: Obstetrics;  Laterality: N/A;   Family History  Problem Relation Age of Onset  . Hypertension Mother   . Hyperlipidemia Mother   . Heart disease Father     MI  . Alcohol abuse Father   . Alcohol abuse Brother   . Diabetes Maternal Grandmother   .  Hyperlipidemia Maternal Grandmother    Social History  Substance Use Topics  . Smoking status: Current Every Day Smoker -- 0.50 packs/day for .5 years    Types: Cigarettes    Last Attempt to Quit: 06/21/2012  . Smokeless tobacco: Never Used  . Alcohol Use: No   OB History    Gravida Para Term Preterm AB TAB SAB Ectopic Multiple Living   Review of Systems  All other systems reviewed and are negative.     Allergies  Cinnamon flavor; Codeine; Flomax; Ibuprofen; Levofloxacin; and Reglan  Home Medications   Prior to Admission medications   Medication Sig Start Date End Date Taking? Authorizing Provider  acetaminophen (TYLENOL) 500 MG tablet Take 1,000 mg by mouth every 6 (six) hours as needed for fever.    Historical Provider, MD  doxycycline (VIBRAMYCIN) 100 MG capsule Take 1 capsule (100 mg total) by mouth 2 (two) times daily. 02/14/15   Mancel Bale, MD  HYDROcodone-acetaminophen (NORCO) 5-325 MG tablet Take 2 tablets by mouth every 4 (four) hours as needed. 02/14/15   Mancel Bale, MD  omeprazole (PRILOSEC) 40 MG capsule Take 1 capsule (40 mg total) by mouth daily. 11/25/14   [name], MD  oxyCODONE-acetaminophen (ROXICET) 5-325 MG tablet Take  1 tablet by mouth every 4 (four) hours as needed for severe pain. Patient not taking: Reported on 01/19/2015 11/25/14   Frederica KusterStephen M Miller, MD  traMADol (ULTRAM) 50 MG tablet Take 1 tablet (50 mg total) by mouth every 6 (six) hours as needed. 01/16/15   Samuel JesterKathleen McManus, DO  venlafaxine (EFFEXOR) 75 MG tablet Take 1 tablet (75 mg total) by mouth 2 (two) times daily. 12/30/14   Elenora GammaSamuel L Bradshaw, MD   BP 142/76 mmHg  Pulse 13  Temp(Src) 97.8 F (36.6 C) (Oral)  Resp 20  Ht 5\' 9"  (1.753 m)  Wt 360 lb (163.295 kg)  BMI 53.14 kg/m2  SpO2 100%  LMP 02/08/2015 Physical Exam  Constitutional: She is oriented to person, place, and time. She appears well-developed.  Morbidly obese  HENT:  Head: Normocephalic and  atraumatic.  Right Ear: External ear normal.  Left Ear: External ear normal.  Eyes: Conjunctivae and EOM are normal. Pupils are equal, round, and reactive to light.  Neck: Normal range of motion and phonation normal. Neck supple.  Cardiovascular: Normal rate, regular rhythm and normal heart sounds.   Pulmonary/Chest: Effort normal and breath sounds normal. No respiratory distress. She has no wheezes. She exhibits no bony tenderness.  Occasional dry cough noted during examination.  Musculoskeletal: Normal range of motion.  Neurological: She is alert and oriented to person, place, and time. No cranial nerve deficit or sensory deficit. She exhibits normal muscle tone. Coordination normal.  Skin: Skin is warm, dry and intact.  Psychiatric: She has a normal mood and affect. Her behavior is normal. Judgment and thought content normal.  Nursing note and vitals reviewed.   ED Course  Procedures (including critical care time) Medications  albuterol (PROVENTIL HFA;VENTOLIN HFA) 108 (90 Base) MCG/ACT inhaler 2 puff (not administered)  aerochamber Z-Stat Plus/medium 1 each (not administered)    Patient Vitals for the past 24 hrs:  BP Temp Temp src Pulse Resp SpO2 Height Weight  02/14/15 1422 142/76 mmHg 97.8 F (36.6 C) Oral (!) 13 20 100 % 5\' 9"  (1.753 m) (!) 360 lb (163.295 kg)    3:43 PM Reevaluation with update and discussion. After initial assessment and treatment, an updated evaluation reveals no change in clinical status. Findings discussed with patient, all questions were answered.Mancel Bale. [name]    Labs Review Labs Reviewed - No data to display  Imaging Review No results found. I have personally reviewed and evaluated these images and lab results as part of my medical decision-making.   EKG Interpretation None      MDM   Final diagnoses:  Acute bronchitis, unspecified organism  Tobacco abuse    Clinical evidence for bronchitis. Doubt serious bacterial infection,  metabolic instability or impending vascular collapse.   Nursing Notes Reviewed/ Care Coordinated Applicable Imaging Reviewed Interpretation of Laboratory Data incorporated into ED treatment  The patient appears reasonably screened and/or stabilized for discharge and I doubt any other medical condition or other [facility] requiring further screening, evaluation, or treatment in the ED at this time prior to discharge.  Plan: Home Medications- Albuterol, Doxycycline, Norco; Home Treatments- rest, stop smoking; return here if the recommended treatment, does not improve the symptoms; Recommended follow up- PCP prn   Mancel BaleElliott Symphoni Helbling, MD 02/15/15 1133

## 2015-02-14 NOTE — Discharge Instructions (Signed)
Acute Bronchitis Bronchitis is inflammation of the airways that extend from the windpipe into the lungs (bronchi). The inflammation often causes mucus to develop. This leads to a cough, which is the most common symptom of bronchitis.  In acute bronchitis, the condition usually develops suddenly and goes away over time, usually in a couple weeks. Smoking, allergies, and asthma can make bronchitis worse. Repeated episodes of bronchitis may cause further lung problems.  CAUSES Acute bronchitis is most often caused by the same virus that causes a cold. The virus can spread from person to person (contagious) through coughing, sneezing, and touching contaminated objects. SIGNS AND SYMPTOMS   Cough.   Fever.   Coughing up mucus.   Body aches.   Chest congestion.   Chills.   Shortness of breath.   Sore throat.  DIAGNOSIS  Acute bronchitis is usually diagnosed through a physical exam. Your health care provider will also ask you questions about your medical history. Tests, such as chest X-rays, are sometimes done to rule out other conditions.  TREATMENT  Acute bronchitis usually goes away in a couple weeks. Oftentimes, no medical treatment is necessary. Medicines are sometimes given for relief of fever or cough. Antibiotic medicines are usually not needed but may be prescribed in certain situations. In some cases, an inhaler may be recommended to help reduce shortness of breath and control the cough. A cool mist vaporizer may also be used to help thin bronchial secretions and make it easier to clear the chest.  HOME CARE INSTRUCTIONS  Get plenty of rest.   Drink enough fluids to keep your urine clear or pale yellow (unless you have a medical condition that requires fluid restriction). Increasing fluids may help thin your respiratory secretions (sputum) and reduce chest congestion, and it will prevent dehydration.   Take medicines only as directed by your health care provider.  If  you were prescribed an antibiotic medicine, finish it all even if you start to feel better.  Avoid smoking and secondhand smoke. Exposure to cigarette smoke or irritating chemicals will make bronchitis worse. If you are a smoker, consider using nicotine gum or skin patches to help control withdrawal symptoms. Quitting smoking will help your lungs heal faster.   Reduce the chances of another bout of acute bronchitis by washing your hands frequently, avoiding people with cold symptoms, and trying not to touch your hands to your mouth, nose, or eyes.   Keep all follow-up visits as directed by your health care provider.  SEEK MEDICAL CARE IF: Your symptoms do not improve after 1 week of treatment.  SEEK IMMEDIATE MEDICAL CARE IF:  You develop an increased fever or chills.   You have chest pain.   You have severe shortness of breath.  You have bloody sputum.   You develop dehydration.  You faint or repeatedly feel like you are going to pass out.  You develop repeated vomiting.  You develop a severe headache. MAKE SURE YOU:   Understand these instructions.  Will watch your condition.  Will get help right away if you are not doing well or get worse.   This information is not intended to replace advice given to you by your health care provider. Make sure you discuss any questions you have with your health care provider.   Document Released: 03/10/2004 Document Revised: 02/21/2014 Document Reviewed: 07/24/2012 Elsevier Interactive Patient Education 2016 [employer].  Smoking Cessation, Tips for Success If you are ready to quit smoking, congratulations! You have chosen to help  yourself be healthier. Cigarettes bring nicotine, tar, carbon monoxide, and other irritants into your body. Your lungs, heart, and blood vessels will be able to work better without these poisons. There are many different ways to quit smoking. Nicotine gum, nicotine patches, a nicotine inhaler, or nicotine  nasal spray can help with physical craving. Hypnosis, support groups, and medicines help break the habit of smoking. WHAT THINGS CAN I DO TO MAKE QUITTING EASIER?  Here are some tips to help you quit for good:  Pick a date when you will quit smoking completely. Tell all of your friends and family about your plan to quit on that date.  Do not try to slowly cut down on the number of cigarettes you are smoking. Pick a quit date and quit smoking completely starting on that day.  Throw away all cigarettes.   Clean and remove all ashtrays from your home, work, and car.  On a card, write down your reasons for quitting. Carry the card with you and read it when you get the urge to smoke.  Cleanse your body of nicotine. Drink enough water and fluids to keep your urine clear or pale yellow. Do this after quitting to flush the nicotine from your body.  Learn to predict your moods. Do not let a bad situation be your excuse to have a cigarette. Some situations in your life might tempt you into wanting a cigarette.  Never have "just one" cigarette. It leads to wanting another and another. Remind yourself of your decision to quit.  Change habits associated with smoking. If you smoked while driving or when feeling stressed, try other activities to replace smoking. Stand up when drinking your coffee. Brush your teeth after eating. Sit in a different chair when you read the paper. Avoid alcohol while trying to quit, and try to drink fewer caffeinated beverages. Alcohol and caffeine may urge you to smoke.  Avoid foods and drinks that can trigger a desire to smoke, such as sugary or spicy foods and alcohol.  Ask people who smoke not to smoke around you.  Have something planned to do right after eating or having a cup of coffee. For example, plan to take a walk or exercise.  Try a relaxation exercise to calm you down and decrease your stress. Remember, you may be tense and nervous for the first 2 weeks after  you quit, but this will pass.  Find new activities to keep your hands busy. Play with a pen, coin, or rubber band. Doodle or draw things on paper.  Brush your teeth right after eating. This will help cut down on the craving for the taste of tobacco after meals. You can also try mouthwash.   Use oral substitutes in place of cigarettes. Try using lemon drops, carrots, cinnamon sticks, or chewing gum. Keep them handy so they are available when you have the urge to smoke.  When you have the urge to smoke, try deep breathing.  Designate your home as a nonsmoking area.  If you are a heavy smoker, ask your health care provider about a prescription for nicotine chewing gum. It can ease your withdrawal from nicotine.  Reward yourself. Set aside the cigarette money you save and buy yourself something nice.  Look for support from others. Join a support group or smoking cessation program. Ask someone at home or at work to help you with your plan to quit smoking.  Always ask yourself, "Do I need this cigarette or is this just a  reflex?" Tell yourself, "Today, I choose not to smoke," or "I do not want to smoke." You are reminding yourself of your decision to quit.  Do not replace cigarette smoking with electronic cigarettes (commonly called e-cigarettes). The safety of e-cigarettes is unknown, and some may contain harmful chemicals.  If you relapse, do not give up! Plan ahead and think about what you will do the next time you get the urge to smoke. HOW WILL I FEEL WHEN I QUIT SMOKING? You may have symptoms of withdrawal because your body is used to nicotine (the addictive substance in cigarettes). You may crave cigarettes, be irritable, feel very hungry, cough often, get headaches, or have difficulty concentrating. The withdrawal symptoms are only temporary. They are strongest when you first quit but will go away within 10-14 days. When withdrawal symptoms occur, stay in control. Think about your reasons  for quitting. Remind yourself that these are signs that your body is healing and getting used to being without cigarettes. Remember that withdrawal symptoms are easier to treat than the major diseases that smoking can cause.  Even after the withdrawal is over, expect periodic urges to smoke. However, these cravings are generally short lived and will go away whether you smoke or not. Do not smoke! WHAT RESOURCES ARE AVAILABLE TO HELP ME QUIT SMOKING? Your health care provider can direct you to community resources or hospitals for support, which may include:  Group support.  Education.  Hypnosis.  Therapy.   This information is not intended to replace advice given to you by your health care provider. Make sure you discuss any questions you have with your health care provider.   Document Released: 10/30/2003 Document Revised: 02/21/2014 Document Reviewed: 07/19/2012 Elsevier Interactive Patient Education 2016 [employer].  Smoking Hazards Smoking cigarettes is extremely bad for your health. Tobacco smoke has over 200 known poisons in it. It contains the poisonous gases nitrogen oxide and carbon monoxide. There are over 60 chemicals in tobacco smoke that cause cancer. Some of the chemicals found in cigarette smoke include:   Cyanide.   Benzene.   Formaldehyde.   Methanol (wood alcohol).   Acetylene (fuel used in welding torches).   Ammonia.  Even smoking lightly shortens your life expectancy by several years. You can greatly reduce the risk of medical problems for you and your family by stopping now. Smoking is the most preventable cause of death and disease in our society. Within days of quitting smoking, your circulation improves, you decrease the risk of having a heart attack, and your lung capacity improves. There may be some increased phlegm in the first few days after quitting, and it may take months for your lungs to clear up completely. Quitting for 10 years reduces your  risk of developing lung cancer to almost that of a nonsmoker.  WHAT ARE THE RISKS OF SMOKING? Cigarette smokers have an increased risk of many serious medical problems, including:  Lung cancer.   Lung disease (such as pneumonia, bronchitis, and emphysema).   Heart attack and chest pain due to the heart not getting enough oxygen (angina).   Heart disease and peripheral blood vessel disease.   Hypertension.   Stroke.   Oral cancer (cancer of the lip, mouth, or voice box).   Bladder cancer.   Pancreatic cancer.   Cervical cancer.   Pregnancy complications, including premature birth.   Stillbirths and smaller newborn babies, birth defects, and genetic damage to sperm.   Early menopause.   Lower estrogen level for  women.   Infertility.   Facial wrinkles.   Blindness.   Increased risk of broken bones (fractures).   Senile dementia.   Stomach ulcers and internal bleeding.   Delayed wound healing and increased risk of complications during surgery. Because of secondhand smoke exposure, children of smokers have an increased risk of the following:   Sudden infant death syndrome (SIDS).   Respiratory infections.   Lung cancer.   Heart disease.   Ear infections.  WHY IS SMOKING ADDICTIVE? Nicotine is the chemical agent in tobacco that is capable of causing addiction or dependence. When you smoke and inhale, nicotine is absorbed rapidly into the bloodstream through your lungs. Both inhaled and noninhaled nicotine may be addictive.  WHAT ARE THE BENEFITS OF QUITTING?  There are many health benefits to quitting smoking. Some are:   The likelihood of developing cancer and heart disease decreases. Health improvements are seen almost immediately.   Blood pressure, pulse rate, and breathing patterns start returning to normal soon after quitting.   People who quit may see an improvement in their overall quality of life.  HOW DO YOU QUIT  SMOKING? Smoking is an addiction with both physical and psychological effects, and longtime habits can be hard to change. Your health care provider can recommend:  Programs and community resources, which may include group support, education, or therapy.  Replacement products, such as patches, gum, and nasal sprays. Use these products only as directed. Do not replace cigarette smoking with electronic cigarettes (commonly called e-cigarettes). The safety of e-cigarettes is unknown, and some may contain harmful chemicals. FOR MORE INFORMATION  American Lung Association: www.lung.org  American Cancer Society: www.cancer.org   This information is not intended to replace advice given to you by your health care provider. Make sure you discuss any questions you have with your health care provider.   Document Released: 03/10/2004 Document Revised: 11/21/2012 Document Reviewed: 07/23/2012 Elsevier Interactive Patient Education [employer]vier Inc.

## 2015-03-02 ENCOUNTER — Encounter: Payer: Self-pay | Admitting: Family Medicine

## 2015-03-02 ENCOUNTER — Ambulatory Visit (INDEPENDENT_AMBULATORY_CARE_PROVIDER_SITE_OTHER): Payer: Medicaid Other | Admitting: Family Medicine

## 2015-03-02 VITALS — BP 132/85 | HR 81 | Temp 97.1°F | Ht 69.0 in | Wt 360.0 lb

## 2015-03-02 DIAGNOSIS — F39 Unspecified mood [affective] disorder: Secondary | ICD-10-CM

## 2015-03-02 MED ORDER — ARIPIPRAZOLE 5 MG PO TABS: 5.0000 mg | ORAL_TABLET | Freq: Every day | ORAL | Status: DC

## 2015-03-02 NOTE — Patient Instructions (Signed)
Great to see you!  Start Abilify/aripiprazole in addition to Effexor/venlafaxine. Take the pill at night.  Please look through the list of psychiatrists that I am giving you, you have to call to make an appointment.    Taking the medicine as directed and not missing any doses is one of the best things you can do to treat your depression.  Here are some things to keep in mind:  1) Side effects (stomach upset, some increased anxiety) may happen before you notice a benefit.  These side effects typically go away over time. 2) Changes to your dose of medicine or a change in medication all together is sometimes necessary 3) Most people need to be on medication at least 6-12 months 4) Many people will notice an improvement within two weeks but the full effect of the medication can take up to 4-6 weeks 5) Stopping the medication when you start feeling better often results in a return of symptoms 6) If you start having thoughts of hurting yourself or others after starting this medicine, please call 911 immediately

## 2015-03-02 NOTE — Progress Notes (Signed)
   HPI  Patient presents today for follow-up depression and anxiety.  Patient explains that she's been taking Effexor twice daily for several months now. She does feel better but states that the anxiety is pretty bad. She has no history of bipolar disorder or family history of bipolar disorder. She denies suicidal thoughts, but states that she does feel like failure and that she is failing her children. She denies direct suicidal thoughts today. He is not sleeping well. She is depressed feelings and anhedonia She feels that the anxiety is the biggest problem now.   PMH: Smoking status noted ROS: Per HPI  Objective: BP 132/85 mmHg  Pulse 81  Temp(Src) 97.1 F (36.2 C) (Oral)  Ht 5\' 9"  (1.753 m)  Wt 360 lb (163.295 kg)  BMI 53.14 kg/m2  LMP 02/08/2015 Gen: NAD, alert, cooperative with exam HEENT: NCAT CV: RRR, good S1/S2, no murmur Resp: CTABL, no wheezes, non-labored Ext: No edema, warm Neuro: Alert and oriented, No gross deficits  Psychiatric: Denies suicidal ideation as above, depressed mood and affect  Assessment and plan:  # Depression, mood disorder, anxiety She has had improvement with Effexor, however she feels that her anxiety and depression is still largely uncontrolled. Starting Abilify today I also recommended that she see a psychiatrist, we have given her a list of psychiatrists that except Medicaid in the area. Follow-up in 3-4 weeks unless she has an established psychiatry appointment by that point Discussed possibility of suicidal thoughts induced by medication, she contracts for safety. In the past she has described passive suicidal thoughts, today she denies them in our discussion but not on the depression screen  PHQ-9 score 16, 1 on #9  Orders Placed This Encounter  Procedures  . Ambulatory referral to Psychiatry    Referral Priority:  Routine    Referral Type:  Psychiatric    Referral Reason:  Specialty Services Required    Requested Specialty:   Psychiatry    Number of Visits Requested:  1    Meds ordered this encounter  Medications  . ARIPiprazole (ABILIFY) 5 MG tablet    Sig: Take 1 tablet (5 mg total) by mouth daily.    Dispense:  30 tablet    Refill:  3    Murtis SinkSam Loral Campi, MD [facility] 03/02/2015, 10:42 AM

## 2015-03-24 ENCOUNTER — Ambulatory Visit: Payer: Medicaid Other | Admitting: Family Medicine

## 2015-03-25 ENCOUNTER — Encounter: Payer: Self-pay | Admitting: Family Medicine

## 2015-04-01 ENCOUNTER — Other Ambulatory Visit: Payer: Self-pay

## 2015-04-01 DIAGNOSIS — F39 Unspecified mood [affective] disorder: Secondary | ICD-10-CM

## 2015-04-01 MED ORDER — VENLAFAXINE HCL 75 MG PO TABS
75.0000 mg | ORAL_TABLET | Freq: Two times a day (BID) | ORAL | Status: DC
Start: 2015-04-01 — End: 2015-06-01

## 2015-04-07 ENCOUNTER — Encounter (HOSPITAL_COMMUNITY): Payer: Self-pay | Admitting: Emergency Medicine

## 2015-04-07 ENCOUNTER — Emergency Department (HOSPITAL_COMMUNITY)
Admission: EM | Admit: 2015-04-07 | Discharge: 2015-04-08 | Disposition: A | Discharge status: Home / Self Care | Payer: Medicaid Other | Attending: Emergency Medicine | Admitting: Emergency Medicine

## 2015-04-07 ENCOUNTER — Emergency Department (HOSPITAL_COMMUNITY): Payer: Medicaid Other

## 2015-04-07 DIAGNOSIS — Z87442 Personal history of urinary calculi: Secondary | ICD-10-CM | POA: Diagnosis not present

## 2015-04-07 DIAGNOSIS — Z9889 Other specified postprocedural states: Secondary | ICD-10-CM | POA: Insufficient documentation

## 2015-04-07 DIAGNOSIS — Z8742 Personal history of other diseases of the female genital tract: Secondary | ICD-10-CM | POA: Diagnosis not present

## 2015-04-07 DIAGNOSIS — Q6211 Congenital occlusion of ureteropelvic junction: Secondary | ICD-10-CM | POA: Diagnosis not present

## 2015-04-07 DIAGNOSIS — Z87440 Personal history of urinary (tract) infections: Secondary | ICD-10-CM | POA: Diagnosis not present

## 2015-04-07 DIAGNOSIS — G8929 Other chronic pain: Secondary | ICD-10-CM | POA: Diagnosis not present

## 2015-04-07 DIAGNOSIS — Z87828 Personal history of other (healed) physical injury and trauma: Secondary | ICD-10-CM | POA: Insufficient documentation

## 2015-04-07 DIAGNOSIS — F17210 Nicotine dependence, cigarettes, uncomplicated: Secondary | ICD-10-CM | POA: Insufficient documentation

## 2015-04-07 DIAGNOSIS — Z79899 Other long term (current) drug therapy: Secondary | ICD-10-CM | POA: Diagnosis not present

## 2015-04-07 DIAGNOSIS — K219 Gastro-esophageal reflux disease without esophagitis: Secondary | ICD-10-CM | POA: Diagnosis not present

## 2015-04-07 DIAGNOSIS — Q6239 Other obstructive defects of renal pelvis and ureter: Secondary | ICD-10-CM | POA: Insufficient documentation

## 2015-04-07 DIAGNOSIS — Z3202 Encounter for pregnancy test, result negative: Secondary | ICD-10-CM | POA: Diagnosis not present

## 2015-04-07 DIAGNOSIS — Z905 Acquired absence of kidney: Secondary | ICD-10-CM | POA: Diagnosis not present

## 2015-04-07 DIAGNOSIS — R109 Unspecified abdominal pain: Secondary | ICD-10-CM

## 2015-04-07 LAB — CBC WITH DIFFERENTIAL/PLATELET
BASOS PCT: 0 %
Basophils Absolute: 0 10*3/uL (ref 0.0–0.1)
Eosinophils Absolute: 0.8 10*3/uL — ABNORMAL HIGH (ref 0.0–0.7)
Eosinophils Relative: 6 %
HEMATOCRIT: 41 % (ref 36.0–46.0)
HEMOGLOBIN: 13.9 g/dL (ref 12.0–15.0)
LYMPHS PCT: 33 %
Lymphs Abs: 4.4 10*3/uL — ABNORMAL HIGH (ref 0.7–4.0)
MCH: 30 pg (ref 26.0–34.0)
MCHC: 33.9 g/dL (ref 30.0–36.0)
MCV: 88.4 fL (ref 78.0–100.0)
Monocytes Absolute: 0.9 10*3/uL (ref 0.1–1.0)
Monocytes Relative: 7 %
NEUTROS ABS: 7.4 10*3/uL (ref 1.7–7.7)
Neutrophils Relative %: 54 %
Platelets: 280 10*3/uL (ref 150–400)
RBC: 4.64 MIL/uL (ref 3.87–5.11)
RDW: 13.4 % (ref 11.5–15.5)
WBC: 13.5 10*3/uL — AB (ref 4.0–10.5)

## 2015-04-07 LAB — BASIC METABOLIC PANEL
ANION GAP: 9 (ref 5–15)
BUN: 22 mg/dL — ABNORMAL HIGH (ref 6–20)
CHLORIDE: 110 mmol/L (ref 101–111)
CO2: 20 mmol/L — AB (ref 22–32)
Calcium: 8.6 mg/dL — ABNORMAL LOW (ref 8.9–10.3)
Creatinine, Ser: 0.8 mg/dL (ref 0.44–1.00)
GFR calc non Af Amer: >60 mL/min (ref >60)
GLUCOSE: 101 mg/dL — AB (ref 65–99)
Potassium: 3.7 mmol/L (ref 3.5–5.1)
Sodium: 139 mmol/L (ref 135–145)

## 2015-04-07 MED ORDER — SODIUM CHLORIDE 0.9 % IV BOLUS (SEPSIS)
1000.0000 mL | Freq: Once | INTRAVENOUS | Status: AC
  Administered 2015-04-07: 1000 mL via INTRAVENOUS

## 2015-04-07 MED ORDER — ONDANSETRON HCL 4 MG/2ML IJ SOLN
4.0000 mg | Freq: Once | INTRAMUSCULAR | Status: AC
  Administered 2015-04-07: 4 mg via INTRAVENOUS
  Filled 2015-04-07: qty 2

## 2015-04-07 MED ORDER — HYDROMORPHONE HCL 1 MG/ML IJ SOLN
1.0000 mg | Freq: Once | INTRAMUSCULAR | Status: AC
  Administered 2015-04-07: 1 mg via INTRAVENOUS
  Filled 2015-04-07: qty 1

## 2015-04-07 MED ORDER — KETOROLAC TROMETHAMINE 30 MG/ML IJ SOLN
30.0000 mg | Freq: Once | INTRAMUSCULAR | Status: AC
  Administered 2015-04-07: 30 mg via INTRAVENOUS
  Filled 2015-04-07: qty 1

## 2015-04-07 NOTE — ED Notes (Signed)
Patient complaining of left flank pain and dysuria starting today.

## 2015-04-07 NOTE — ED Provider Notes (Signed)
CSN: [id number]     Arrival date & time 04/07/15  2205 History   By signing my name below, I, Alice [name], attest that this documentation has been prepared under the direction and in the presence of Alice Octave, MD.  Electronically Signed: [name], ED Scribe. 04/07/2015. 10:53 PM.   Chief Complaint  Patient presents with  . Flank Pain   HPI  HPI Comments: [name] is a 30 y.o. 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 Pain is described as sharp. Pt also reports ongoing dysuria, decreased urinary output, and nausea. P last voided earlier this evening, however, she states "it was only a dribble". Ongoing diarrhea x 2 day also reported. 3 episodes of diarrhea reported today. No aggravating or alleviating factors for above symptoms. No OTC medications or home remedies attempted prior to arrival. No recent fever, chills, vomiting, hematuria, vaginal bleeding, abnormal vaginal discharge.  PCP: Alice Fenton, MD   UROLOGIST: Alice Quint, MD  Past Medical History  Diagnosis Date  . History of kidney stones   . 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 History of recurrent UTIs   . Round ligament pain 09/10/2012  . Morbid obesity (HCC)   . GERD (gastroesophageal reflux disease)   . Preterm labor   . Congenital obstruction of ureteropelvic junction (UPJ)   . Torn ACL   . Chronic pain   . Loin pain hematuria syndrome     Dr. [name] at [facility]  . Chronic left flank pain    Past Surgical History  Procedure Laterality Date  . Nephrectomy      right side  . Nasal sinus surgery    . Tonsillectomy    . Other surgical history      ulner nerve removal  . Addenoidectomy    . Cesarean section N/A 02/22/2013    Procedure: CESAREAN SECTION/TWINS;  Surgeon: Alice Arms, MD;  Location: [facility];  Service: Obstetrics;  Laterality: N/A;   Family History  Problem Relation Age of Onset  . Hypertension Mother   . Hyperlipidemia Mother   . Heart disease Father     MI  . Alcohol abuse Father   . Alcohol abuse Brother   . Diabetes Maternal Grandmother   . Hyperlipidemia Maternal Grandmother    Social History  Substance Use Topics  . Smoking status: Current Every Day Smoker -- 0.50 packs/day for .5 years    Types: Cigarettes    Last Attempt to Quit: 06/21/2012  . Smokeless tobacco: Never Used  . Alcohol Use: No   OB History    Gravida Para Term Preterm AB TAB SAB Ectopic Multiple Living   Review of Systems  A complete 10 system review of systems was obtained and all systems are negative except as noted in the HPI and PMH.    Allergies  Cinnamon flavor; Codeine; Flomax; Ibuprofen; Levofloxacin; and Reglan  Home Medications   Prior to Admission medications   Medication Sig Start Date End Date Taking? Authorizing Provider  ARIPiprazole (ABILIFY) 5 MG tablet Take 1 tablet (5 mg total) by mouth daily. 03/02/15   Alice Gamma, MD  omeprazole (PRILOSEC) 40 MG capsule Take 1 capsule (40 mg total) by mouth daily. 11/25/14  Alice Kuster, MD  oxyCODONE-acetaminophen (ROXICET) 5-325 MG tablet Take 1 tablet by mouth every 4 (four) hours as needed for severe pain. 11/25/14   Alice Kuster, MD  venlafaxine (EFFEXOR) 75 MG tablet Take 1 tablet (75 mg total) by mouth 2 (two) times daily. 04/01/15   Alice Gamma, MD   Triage Vitals: BP 145/94 mmHg  Pulse 93  Temp(Src) 97.9 F (36.6 C) (Oral)  Resp 20  Ht 5\' 9"  (1.753 m)  Wt 360 lb (163.295 kg)  BMI 53.14 kg/m2  SpO2 98%  LMP 03/18/2015   Physical Exam  Constitutional: She is oriented to person, place, and time. She appears well-developed and well-nourished. No distress.  Uncomfortable Morbidly obese   HENT:  Head: Normocephalic and atraumatic.  Mouth/Throat: Oropharynx is clear and  moist. No oropharyngeal exudate.  Eyes: Conjunctivae and EOM are normal. Pupils are equal, round, and reactive to light.  Neck: Normal range of motion. Neck supple.  No meningismus.  Cardiovascular: Normal rate, regular rhythm, normal heart sounds and intact distal pulses.   No murmur heard. Pulmonary/Chest: Effort normal and breath sounds normal. No respiratory distress.  Abdominal: Soft. There is no tenderness. There is no rebound and no guarding.  Exam limited to body habitus   Genitourinary:  L CVA tenderness  Musculoskeletal: Normal range of motion. She exhibits no edema or tenderness.  Neurological: She is alert and oriented to person, place, and time. No cranial nerve deficit. She exhibits normal muscle tone. Coordination normal.  No ataxia on finger to nose bilaterally. No pronator drift. 5/5 strength throughout. CN 2-12 intact.Equal grip strength. Sensation intact.   Skin: Skin is warm.  Psychiatric: She has a normal mood and affect. Her behavior is normal.  Nursing note and vitals reviewed.   ED Course  Procedures (including critical care time)  DIAGNOSTIC STUDIES: Oxygen Saturation is 98% on RA, Normal by my interpretation.    COORDINATION OF CARE: 10:51 PM- Will give Dilaudid, Zofran, fluids, and Toradol. Will order CBC, BMP, pregnancy urine, and urinalysis. Discussed treatment plan with pt at bedside and pt agreed to plan.     Labs Review Labs Reviewed  CBC WITH DIFFERENTIAL/PLATELET - Abnormal; Notable for the following:    WBC 13.5 (*)    Lymphs Abs 4.4 (*)    Eosinophils Absolute 0.8 (*)    All other components within normal limits  BASIC METABOLIC PANEL - Abnormal; Notable for the following:    CO2 20 (*)    Glucose, Bld 101 (*)    BUN 22 (*)    Calcium 8.6 (*)    All other components within normal limits  URINALYSIS, ROUTINE W REFLEX MICROSCOPIC (NOT AT [facility])  PREGNANCY, URINE    Imaging Review No results found. I have personally reviewed and evaluated  these images and lab results as part of my medical decision-making.   EKG Interpretation None      MDM   Final diagnoses:  Flank pain   Patient reports left flank pain and dysuria with dribbling urine since this evening about 8 PM. No vomiting or fever. History of chronic flank pain as well as loin pain hematuria syndrome. States this pain is worse than usual.  Patient is in no distress. She has pain in her left flank. She states she cannot urinate. Bladder scan shows 150 mL of urine.  Patient given pain medication and nausea medication. She is now able to urinate. We'll obtain CT imaging to rule out recurrent stone.  [employer]  [place] narcotic database reviewed and shows patient received prescription for 90 Percocet 7.5 mg on February 2.  UA negative.   CT pending at time of sign out to Dr. Elesa Massed. Patient is aware that the ED will not be prescribing narcotics. She needs to follow up with her urologist. Dr. [name]. Return precautions discussed.  I personally performed the services described in this documentation, which was scribed in my presence. The recorded information has been reviewed and is accurate.    Alice Octave, MD 04/08/15 0030

## 2015-04-08 LAB — URINALYSIS, ROUTINE W REFLEX MICROSCOPIC
Bilirubin Urine: NEGATIVE
Glucose, UA: NEGATIVE mg/dL
HGB URINE DIPSTICK: NEGATIVE
Ketones, ur: NEGATIVE mg/dL
LEUKOCYTES UA: NEGATIVE
NITRITE: NEGATIVE
PROTEIN: NEGATIVE mg/dL
SPECIFIC GRAVITY, URINE: 1.025 (ref 1.005–1.030)
pH: 6 (ref 5.0–8.0)

## 2015-04-08 LAB — PREGNANCY, URINE: PREG TEST UR: NEGATIVE

## 2015-04-08 MED ORDER — HYDROMORPHONE HCL 1 MG/ML IJ SOLN
1.0000 mg | Freq: Once | INTRAMUSCULAR | Status: AC
  Administered 2015-04-08: 1 mg via INTRAVENOUS
  Filled 2015-04-08: qty 1

## 2015-04-08 NOTE — ED Provider Notes (Signed)
1:20 AM  Assumed care from Dr. Manus Gunning.  Pt is a 30 y.o. F with history of previous right nephrectomy due to congenital obstructive defects who presents with left flank pain for the past day. Urine shows no blood or sign of infection. She is not pregnant. Labs unremarkable. CT scanning shows no acute abdomen is. Patient received 90 Percocet tablets on 03/19/15. She has been informed by Dr. Manus Gunning that she will not be discharged with narcotic pain medication.  Dc'ed with PCP and urology follow-up information.  Alice Maw Shaili Donalson, DO 04/08/15 [phone]

## 2015-04-08 NOTE — Discharge Instructions (Signed)
Flank Pain Take your pain medication as prescribed. Follow up with Dr. [name]. Return to the ED if you develop new or worsening symptoms. Flank pain refers to pain that is located on the side of the body between the upper abdomen and the back. The pain may occur over a short period of time (acute) or may be long-term or reoccurring (chronic). It may be mild or severe. Flank pain can be caused by many things. CAUSES  Some of the more common causes of flank pain include:  Muscle strains.   Muscle spasms.   A disease of your spine (vertebral disk disease).   A lung infection (pneumonia).   Fluid around your lungs (pulmonary edema).   A kidney infection.   Kidney stones.   A very painful skin rash caused by the chickenpox virus (shingles).   Gallbladder disease.  HOME CARE INSTRUCTIONS  Home care will depend on the cause of your pain. In general,  Rest as directed by your caregiver.  Drink enough fluids to keep your urine clear or pale yellow.  Only take over-the-counter or prescription medicines as directed by your caregiver. Some medicines may help relieve the pain.  Tell your caregiver about any changes in your pain.  Follow up with your caregiver as directed. SEEK IMMEDIATE MEDICAL CARE IF:   Your pain is not controlled with medicine.   You have new or worsening symptoms.  Your pain increases.   You have abdominal pain.   You have shortness of breath.   You have persistent nausea or vomiting.   You have swelling in your abdomen.   You feel faint or pass out.   You have blood in your urine.  You have a fever or persistent symptoms for more than 2-3 days.  You have a fever and your symptoms suddenly get worse. MAKE SURE YOU:   Understand these instructions.  Will watch your condition.  Will get help right away if you are not doing well or get worse.   This information is not intended to replace advice given to you by your health care  provider. Make sure you discuss any questions you have with your health care provider.   Document Released: 03/24/2005 Document Revised: 10/26/2011 Document Reviewed: 09/15/2011 Elsevier Interactive Patient Education [employer]

## 2015-04-16 ENCOUNTER — Ambulatory Visit (HOSPITAL_COMMUNITY): Payer: Self-pay | Admitting: Psychiatry

## 2015-05-01 ENCOUNTER — Encounter: Payer: Self-pay | Admitting: Family Medicine

## 2015-05-01 ENCOUNTER — Ambulatory Visit (INDEPENDENT_AMBULATORY_CARE_PROVIDER_SITE_OTHER): Payer: Medicaid Other | Admitting: Family Medicine

## 2015-05-01 ENCOUNTER — Encounter (INDEPENDENT_AMBULATORY_CARE_PROVIDER_SITE_OTHER): Payer: Self-pay

## 2015-05-01 VITALS — BP 138/85 | HR 97 | Temp 97.9°F | Ht 69.0 in | Wt 373.0 lb

## 2015-05-01 DIAGNOSIS — Z7251 High risk heterosexual behavior: Secondary | ICD-10-CM | POA: Diagnosis not present

## 2015-05-01 NOTE — Progress Notes (Signed)
Subjective:  Patient ID: Alice [name], female    DOB: Apr 20, 1985  Age: 30 y.o. MRN: [id number]  CC: std check   HPI [place] presents for unprotected sex 2 days ago. No sx. Just worried, potentially exposed to STD.   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   She has past surgical history that includes Nephrectomy; Nasal sinus surgery; Tonsillectomy; Other surgical history; addenoidectomy; and Cesarean section (N/A, 02/22/2013).   Her family history includes Alcohol abuse in her brother and father; Diabetes in her maternal grandmother; Heart disease in her father; Hyperlipidemia in her maternal grandmother and mother; Hypertension in her mother.She reports that she has been smoking Cigarettes.  She has a .25 pack-year smoking history. She has never used smokeless tobacco. She reports that she does not drink alcohol or use illicit drugs.    ROS Review of Systems  Constitutional: Negative for fever, activity change and appetite change.  Genitourinary: Negative for dysuria, vaginal discharge, genital sores, vaginal pain, menstrual problem, pelvic pain and dyspareunia.  Musculoskeletal: Negative for arthralgias.    Objective:  BP 138/85 mmHg  Pulse 97  Temp(Src) 97.9 F (36.6 C) (Oral)  Ht  (1.753 m)  Wt 373 lb (169.192 kg)  BMI 55.06 kg/m2  LMP 03/18/2015  BP Readings from Last 3 Encounters:  05/01/15 138/85  04/07/15 145/94  03/02/15 132/85    Wt Readings from Last 3 Encounters:  05/01/15 373 lb (169.192 kg)  04/07/15 360 lb (163.295 kg)  03/02/15 360 lb (163.295 kg)     Physical Exam    Constitutional: She is oriented to person, place, and time. She appears well-developed. She appears distressed (Patient is angry, hostile due to having had to wait 1 hour in spite of explanation about an emergency case the had to be placed in front of her).  HENT:  Head: Normocephalic and atraumatic.  Eyes: Pupils are equal, round, and reactive to light.  Musculoskeletal: Normal range of motion. She exhibits no edema.  Neurological: She is alert and oriented to person, place, and time.  Skin: Skin is warm. She is not diaphoretic.  Psychiatric: Her affect is angry and inappropriate. Her speech is rapid and/or pressured. She is agitated, aggressive and combative. Thought content is paranoid. Cognition and memory are normal. She expresses impulsivity and inappropriate judgment.     Lab Results  Component Value Date   WBC 13.5* 04/07/2015   HGB 13.9 04/07/2015   HCT 41.0 04/07/2015   PLT 280 04/07/2015   GLUCOSE 101* 04/07/2015   ALT 47 01/16/2015   AST 33 01/16/2015   NA 139 04/07/2015   K 3.7 04/07/2015   CL 110 04/07/2015   CREATININE 0.80 04/07/2015   BUN 22* 04/07/2015   CO2 20* 04/07/2015    Ct Renal Stone Study  04/08/2015  CLINICAL DATA:  Left flank pain for 1 day. EXAM: CT ABDOMEN AND PELVIS WITHOUT CONTRAST TECHNIQUE: Multidetector CT imaging of the abdomen and pelvis was performed following the standard protocol without IV contrast. COMPARISON:  CT 01/16/2015 FINDINGS: Lower chest:  The included lung bases are clear. Liver: Prominent size with steatosis.  No evidence of focal lesion. Hepatobiliary: Gallstone within physiologically distended gallbladder. No pericholecystic  inflammation. Pancreas: No ductal dilatation or inflammation. Spleen: Prominent in size measuring 14.9 cm craniocaudal. Adrenal glands: No nodule. Kidneys: Post right nephrectomy. No left urolithiasis or hydronephrosis. No perinephric stranding. Ureter is decompressed without stones along its course.  Stomach/Bowel: Stomach physiologically distended. There are no dilated or thickened small bowel loops. Small volume of stool throughout the colon without colonic wall thickening. The appendix is normal. Vascular/Lymphatic: No retroperitoneal adenopathy. Abdominal aorta is normal in caliber. Reproductive: Uterus unremarkable for CT appearance. Ovaries symmetric in size. Bladder: Decompressed and not well evaluated. Other: No free air, free fluid, or intra-abdominal fluid collection. Musculoskeletal: There are no acute or suspicious osseous abnormalities. IMPRESSION: No renal stones or obstructive uropathy. Post right nephrectomy. No acute abnormality in the abdomen/pelvis. Electronically Signed   By: Rubye OaksMelanie  Ehinger M.D.   On: 04/08/2015 01:05    Assessment & Plan:   [place] was seen today for std check.  Diagnoses and all orders for this visit:  High risk heterosexual behavior -     HIV antibody -     GC/Chlamydia Probe Amp -     RPR      I am having Ms. Conyer maintain her oxyCODONE-acetaminophen, omeprazole, ARIPiprazole, and venlafaxine.  No orders of the defined types were placed in this encounter.     Follow-up: Return if symptoms worsen or fail to improve.  Mechele ClaudeWarren Halana Deisher, M.D.

## 2015-05-02 LAB — RPR: RPR: NONREACTIVE

## 2015-05-02 LAB — HIV ANTIBODY (ROUTINE TESTING W REFLEX): HIV Screen 4th Generation wRfx: NONREACTIVE

## 2015-05-03 LAB — GC/CHLAMYDIA PROBE AMP
Chlamydia trachomatis, NAA: NEGATIVE
Neisseria gonorrhoeae by PCR: NEGATIVE

## 2015-05-20 ENCOUNTER — Emergency Department (HOSPITAL_COMMUNITY): Payer: Medicaid Other

## 2015-05-20 ENCOUNTER — Encounter (HOSPITAL_COMMUNITY): Payer: Self-pay | Admitting: Emergency Medicine

## 2015-05-20 ENCOUNTER — Emergency Department (HOSPITAL_COMMUNITY)
Admission: EM | Admit: 2015-05-20 | Discharge: 2015-05-20 | Disposition: A | Discharge status: Home / Self Care | Payer: Medicaid Other | Attending: Emergency Medicine | Admitting: Emergency Medicine

## 2015-05-20 DIAGNOSIS — K625 Hemorrhage of anus and rectum: Secondary | ICD-10-CM | POA: Diagnosis present

## 2015-05-20 DIAGNOSIS — R112 Nausea with vomiting, unspecified: Secondary | ICD-10-CM

## 2015-05-20 DIAGNOSIS — R197 Diarrhea, unspecified: Secondary | ICD-10-CM | POA: Insufficient documentation

## 2015-05-20 DIAGNOSIS — F17210 Nicotine dependence, cigarettes, uncomplicated: Secondary | ICD-10-CM | POA: Insufficient documentation

## 2015-05-20 DIAGNOSIS — R109 Unspecified abdominal pain: Secondary | ICD-10-CM | POA: Diagnosis not present

## 2015-05-20 DIAGNOSIS — Z79899 Other long term (current) drug therapy: Secondary | ICD-10-CM | POA: Diagnosis not present

## 2015-05-20 LAB — CBC WITH DIFFERENTIAL/PLATELET
Basophils Absolute: 0 10*3/uL (ref 0.0–0.1)
Basophils Relative: 0 %
Eosinophils Absolute: 0.5 10*3/uL (ref 0.0–0.7)
Eosinophils Relative: 4 %
HCT: 44.4 % (ref 36.0–46.0)
HEMOGLOBIN: 14.6 g/dL (ref 12.0–15.0)
LYMPHS ABS: 3.6 10*3/uL (ref 0.7–4.0)
LYMPHS PCT: 29 %
MCH: 28.9 pg (ref 26.0–34.0)
MCHC: 32.9 g/dL (ref 30.0–36.0)
MCV: 87.9 fL (ref 78.0–100.0)
Monocytes Absolute: 0.9 10*3/uL (ref 0.1–1.0)
Monocytes Relative: 7 %
NEUTROS PCT: 60 %
Neutro Abs: 7.4 10*3/uL (ref 1.7–7.7)
Platelets: 335 10*3/uL (ref 150–400)
RBC: 5.05 MIL/uL (ref 3.87–5.11)
RDW: 13.6 % (ref 11.5–15.5)
WBC: 12.3 10*3/uL — AB (ref 4.0–10.5)

## 2015-05-20 LAB — URINALYSIS, ROUTINE W REFLEX MICROSCOPIC
BILIRUBIN URINE: NEGATIVE
GLUCOSE, UA: NEGATIVE mg/dL
KETONES UR: NEGATIVE mg/dL
Nitrite: NEGATIVE
PROTEIN: NEGATIVE mg/dL
Specific Gravity, Urine: 1.025 (ref 1.005–1.030)
pH: 5.5 (ref 5.0–8.0)

## 2015-05-20 LAB — LIPASE, BLOOD: LIPASE: 30 U/L (ref 11–51)

## 2015-05-20 LAB — COMPREHENSIVE METABOLIC PANEL
ALK PHOS: 80 U/L (ref 38–126)
ALT: 55 U/L — AB (ref 14–54)
AST: 41 U/L (ref 15–41)
Albumin: 4.3 g/dL (ref 3.5–5.0)
Anion gap: 8 (ref 5–15)
BUN: 13 mg/dL (ref 6–20)
CO2: 23 mmol/L (ref 22–32)
Calcium: 8.6 mg/dL — ABNORMAL LOW (ref 8.9–10.3)
Chloride: 103 mmol/L (ref 101–111)
Creatinine, Ser: 0.77 mg/dL (ref 0.44–1.00)
GFR calc non Af Amer: >60 mL/min (ref >60)
Glucose, Bld: 84 mg/dL (ref 65–99)
Potassium: 3.7 mmol/L (ref 3.5–5.1)
SODIUM: 134 mmol/L — AB (ref 135–145)
TOTAL PROTEIN: 8 g/dL (ref 6.5–8.1)
Total Bilirubin: 0.6 mg/dL (ref 0.3–1.2)

## 2015-05-20 LAB — URINE MICROSCOPIC-ADD ON

## 2015-05-20 LAB — HCG, SERUM, QUALITATIVE: Preg, Serum: NEGATIVE

## 2015-05-20 LAB — POC OCCULT BLOOD, ED: Fecal Occult Bld: NEGATIVE

## 2015-05-20 MED ORDER — ONDANSETRON HCL 4 MG PO TABS: 4.0000 mg | ORAL_TABLET | Freq: Four times a day (QID) | ORAL | Status: DC

## 2015-05-20 MED ORDER — IOPAMIDOL (ISOVUE-300) INJECTION 61%
100.0000 mL | Freq: Once | INTRAVENOUS | Status: AC | PRN
  Administered 2015-05-20: 100 mL via INTRAVENOUS

## 2015-05-20 MED ORDER — MORPHINE SULFATE (PF) 2 MG/ML IV SOLN
4.0000 mg | Freq: Once | INTRAVENOUS | Status: AC
  Administered 2015-05-20: 4 mg via INTRAVENOUS
  Filled 2015-05-20: qty 2

## 2015-05-20 MED ORDER — SODIUM CHLORIDE 0.9 % IV BOLUS (SEPSIS)
1000.0000 mL | Freq: Once | INTRAVENOUS | Status: AC
  Administered 2015-05-20: 1000 mL via INTRAVENOUS

## 2015-05-20 MED ORDER — ONDANSETRON HCL 4 MG/2ML IJ SOLN
4.0000 mg | Freq: Once | INTRAMUSCULAR | Status: AC
  Administered 2015-05-20: 4 mg via INTRAVENOUS
  Filled 2015-05-20: qty 2

## 2015-05-20 NOTE — ED Provider Notes (Signed)
CSN: [id number]     Arrival date & time 05/20/15  1622 History  By signing my name below, I, Linus Galas, attest that this documentation has been prepared under the direction and in the presence of Lavera Guise, MD. Electronically Signed: Linus Galas, ED Scribe. 05/20/2015. 8:17 PM.   Chief Complaint  Patient presents with  . Rectal Bleeding   The history is provided by the patient. No language interpreter was used.   HPI Comments: TIMA CURET is a 30 y.o. female who presents to the Emergency Department with a PMHx of kidney stones, recurrent UTIs, and congenital renal defects complaining of black tarry stools today. Pt also reports 4 days of constant abdominal pain, vomiting, diarrhea, and feeling feverish. Pt rates her pain a 5/10 in severity. Pt states 4 days ago her symptoms began after eating breakfast. Since then, every time she eat she either vomits of has diarrhea. Pt describes her stools today as "black and tarry with streaks of blood". Pt denies any cough, congestion, vaginal bleeding, vaginal discharge, or any other symptoms at this time. Pt denies recent use of ibuprofen, abx, or blood thinners. Pt denies sick contacts.    Past Medical History  Diagnosis Date  . History of kidney stones   . 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 History of recurrent UTIs   . Round ligament pain 09/10/2012  . Morbid obesity (HCC)   . GERD (gastroesophageal reflux disease)   . Preterm labor   . Congenital obstruction of ureteropelvic junction (UPJ)   . Torn ACL   . Chronic pain   . Loin pain hematuria syndrome     Dr. [name] at [facility]  . Chronic left flank pain    Past Surgical History  Procedure Laterality Date  . Nephrectomy      right side  . Nasal sinus surgery    . Tonsillectomy    . Other surgical history      ulner nerve removal  . Addenoidectomy    . Cesarean section N/A 02/22/2013    Procedure: CESAREAN SECTION/TWINS;  Surgeon: [name],  MD;  Location: [facility];  Service: Obstetrics;  Laterality: N/A;   Family History  Problem Relation Age of Onset  . Hypertension Mother   . Hyperlipidemia Mother   . Heart disease Father     MI  . Alcohol abuse Father   . Alcohol abuse Brother   . Diabetes Maternal Grandmother   . Hyperlipidemia Maternal Grandmother    Social History  Substance Use Topics  . Smoking status: Current Every Day Smoker -- 0.50 packs/day for .5 years    Types: Cigarettes    Last Attempt to Quit: 06/21/2012  . Smokeless tobacco: Never Used  . Alcohol Use: No   OB History    Gravida Para Term Preterm AB TAB SAB Ectopic Multiple Living   Review of Systems  Constitutional: Positive for fever.  HENT: Negative for congestion.   Respiratory: Negative for cough.   Gastrointestinal: Positive for vomiting, abdominal pain, diarrhea and blood in stool.  Genitourinary: Negative for vaginal bleeding and vaginal discharge.  All other systems reviewed and are negative.  Allergies  Cinnamon flavor; Codeine; Flomax; Ibuprofen; Levofloxacin; and Reglan  Home Medications   Prior to Admission medications   Medication Sig Start Date End Date Taking? Authorizing Provider  omeprazole (PRILOSEC) 40 MG capsule Take 1 capsule (40  mg total) by mouth daily. 11/25/14  Yes [name], MD  venlafaxine (EFFEXOR) 75 MG tablet Take 1 tablet (75 mg total) by mouth 2 (two) times daily. 04/01/15  Yes [name], MD  ARIPiprazole (ABILIFY) 5 MG tablet Take 1 tablet (5 mg total) by mouth daily. Patient not taking: Reported on 05/20/2015 03/02/15   [name], MD  ondansetron (ZOFRAN) 4 MG tablet Take 1 tablet (4 mg total) by mouth every 6 (six) hours. 05/20/15   Lavera Guise, MD  oxyCODONE-acetaminophen (ROXICET) 5-325 MG tablet Take 1 tablet by mouth every 4 (four) hours as needed for severe pain. Patient not taking: Reported on 05/01/2015 11/25/14   [name], MD   BP 147/96 mmHg  Pulse  98  Temp(Src) 98.4 F (36.9 C) (Oral)  Resp 20  Ht  (1.753 m)  Wt 360 lb (163.295 kg)  BMI 53.14 kg/m2  SpO2 100%  LMP 05/13/2015\  Physical Exam .Physical Exam  Nursing note and vitals reviewed. Constitutional: Well developed, well nourished, non-toxic, and in no acute distress Head: Normocephalic and atraumatic.  Mouth/Throat: Oropharynx is clear and moist.  Neck: Normal range of motion. Neck supple.  Cardiovascular: Normal rate and regular rhythm.   Pulmonary/Chest: Effort normal and breath sounds normal.  Abdominal: Soft, obese, LUQ, RLQ and suprapubic tenderness. There is no rebound and no guarding; rectal exam with dark brown stool, guaiac negative  Musculoskeletal: Normal range of motion.  Neurological: Alert, no facial droop, fluent speech, moves all extremities symmetrically Skin: Skin is warm and dry.  Psychiatric: Cooperative  ED Course  Procedures   DIAGNOSTIC STUDIES: Oxygen Saturation is 99% on room air, normal by my interpretation.    COORDINATION OF CARE: 8:09 PM Will give fluids, antemetic and pain medication. Will order blood work, and urinalysis.  Discussed treatment plan with pt at bedside and pt agreed to plan.  Labs Review Labs Reviewed  CBC WITH DIFFERENTIAL/PLATELET - Abnormal; Notable for the following:    WBC 12.3 (*)    All other components within normal limits  COMPREHENSIVE METABOLIC PANEL - Abnormal; Notable for the following:    Sodium 134 (*)    Calcium 8.6 (*)    ALT 55 (*)    All other components within normal limits  URINALYSIS, ROUTINE W REFLEX MICROSCOPIC (NOT AT [facility]) - Abnormal; Notable for the following:    APPearance HAZY (*)    Hgb urine dipstick TRACE (*)    Leukocytes, UA SMALL (*)    All other components within normal limits  URINE MICROSCOPIC-ADD ON - Abnormal; Notable for the following:    Squamous Epithelial / LPF TOO NUMEROUS TO COUNT (*)    Bacteria, UA MANY (*)    All other components within normal limits   LIPASE, BLOOD  HCG, SERUM, QUALITATIVE  POC OCCULT BLOOD, ED     MDM   Final diagnoses:  Nausea vomiting and diarrhea    30 year old female who presents with concern of GI bleed. On presentation is well-appearing and in no acute distress. Her vital signs are non-concerning. She has a soft and nonsurgical abdomen, with suprapubic and right lower quadrant tenderness to palpation. Guaiac exam is negative and she does not have any evidence of GI bleed at this time. She has a stable hemoglobin and normal BUN/creatinine ratio. Basic blood work notable for mild leukocytosis of 12.3. No major metabolic or electrolyte derangements. Given persistent right lower quadrant abdominal pain and a CT abdomen was pelvis was  performed. She does not have evidence of acute appendicitis or any other acute GI processes on CT. Likely benign GI illness with vomiting and diarrhea. Tolerating by mouth and feels improved after antibiotics and IV fluids. Discharged with Zofran and supportive care instructions for home. Strict return and follow-up instructions reviewed. She expressed understanding of all discharge instructions and felt comfortable with the plan of care.   I personally performed the services described in this documentation, which was scribed in my presence. The recorded information has been reviewed and is accurate.   Monia Timmers Duo LLavera Guiseiu, MD 05/20/15 [id number]

## 2015-05-20 NOTE — ED Notes (Signed)
Pt requesting additional pain medication, Dr Verdie MosherLiu notified,

## 2015-05-20 NOTE — ED Notes (Signed)
Abdominal pain  And vomiting since Saturday, today bright red blood and dark stools

## 2015-05-20 NOTE — Discharge Instructions (Signed)
You do not have any evidence of bleeding from her rectum today. Her CT scan does not show serious infection. Continue to drink plenty of fluids and take medications as prescribed for nausea and vomiting. Return for worsening symptoms including fevers, passing out, persistent vomiting despite nausea medications and inability to keep down food or fluids, or any other symptoms concerning to you.  Nausea and Vomiting Nausea is a sick feeling that often comes before throwing up (vomiting). Vomiting is a reflex where stomach contents come out of your mouth. Vomiting can cause severe loss of body fluids (dehydration). Children and elderly adults can become dehydrated quickly, especially if they also have diarrhea. Nausea and vomiting are symptoms of a condition or disease. It is important to find the cause of your symptoms. CAUSES   Direct irritation of the stomach lining. This irritation can result from increased acid production (gastroesophageal reflux disease), infection, food poisoning, taking certain medicines (such as nonsteroidal anti-inflammatory drugs), alcohol use, or tobacco use.  Signals from the brain.These signals could be caused by a headache, heat exposure, an inner ear disturbance, increased pressure in the brain from injury, infection, a tumor, or a concussion, pain, emotional stimulus, or metabolic problems.  An obstruction in the gastrointestinal tract (bowel obstruction).  Illnesses such as diabetes, hepatitis, gallbladder problems, appendicitis, kidney problems, cancer, sepsis, atypical symptoms of a heart attack, or eating disorders.  Medical treatments such as chemotherapy and radiation.  Receiving medicine that makes you sleep (general anesthetic) during surgery. DIAGNOSIS Your caregiver may ask for tests to be done if the problems do not improve after a few days. Tests may also be done if symptoms are severe or if the reason for the nausea and vomiting is not clear. Tests may  include:  Urine tests.  Blood tests.  Stool tests.  Cultures (to look for evidence of infection).  X-rays or other imaging studies. Test results can help your caregiver make decisions about treatment or the need for additional tests. TREATMENT You need to stay well hydrated. Drink frequently but in small amounts.You may wish to drink water, sports drinks, clear broth, or eat frozen ice pops or gelatin dessert to help stay hydrated.When you eat, eating slowly may help prevent nausea.There are also some antinausea medicines that may help prevent nausea. HOME CARE INSTRUCTIONS   Take all medicine as directed by your caregiver.  If you do not have an appetite, do not force yourself to eat. However, you must continue to drink fluids.  If you have an appetite, eat a normal diet unless your caregiver tells you differently.  Eat a variety of complex carbohydrates (rice, wheat, potatoes, bread), lean meats, yogurt, fruits, and vegetables.  Avoid high-fat foods because they are more difficult to digest.  Drink enough water and fluids to keep your urine clear or pale yellow.  If you are dehydrated, ask your caregiver for specific rehydration instructions. Signs of dehydration may include:  Severe thirst.  Dry lips and mouth.  Dizziness.  Dark urine.  Decreasing urine frequency and amount.  Confusion.  Rapid breathing or pulse. SEEK IMMEDIATE MEDICAL CARE IF:   You have blood or brown flecks (like coffee grounds) in your vomit.  You have black or bloody stools.  You have a severe headache or stiff neck.  You are confused.  You have severe abdominal pain.  You have chest pain or trouble breathing.  You do not urinate at least once every 8 hours.  You develop cold or clammy skin.  You continue to vomit for longer than 24 to 48 hours.  You have a fever. MAKE SURE YOU:   Understand these instructions.  Will watch your condition.  Will get help right away if  you are not doing well or get worse.   This information is not intended to replace advice given to you by your health care provider. Make sure you discuss any questions you have with your health care provider.   Document Released: 01/31/2005 Document Revised: 04/25/2011 Document Reviewed: 06/30/2010 Elsevier Interactive Patient Education 2016 Elsevier Inc.  Diarrhea Diarrhea is frequent loose and watery bowel movements. It can cause you to feel weak and dehydrated. Dehydration can cause you to become tired and thirsty, have a dry mouth, and have decreased urination that often is dark yellow. Diarrhea is a sign of another problem, most often an infection that will not last long. In most cases, diarrhea typically lasts 2-3 days. However, it can last longer if it is a sign of something more serious. It is important to treat your diarrhea as directed by your caregiver to lessen or prevent future episodes of diarrhea. CAUSES  Some common causes include:  Gastrointestinal infections caused by viruses, bacteria, or parasites.  Food poisoning or food allergies.  Certain medicines, such as antibiotics, chemotherapy, and laxatives.  Artificial sweeteners and fructose.  Digestive disorders. HOME CARE INSTRUCTIONS  Ensure adequate fluid intake (hydration): Have 1 cup (8 oz) of fluid for each diarrhea episode. Avoid fluids that contain simple sugars or sports drinks, fruit juices, whole milk products, and sodas. Your urine should be clear or pale yellow if you are drinking enough fluids. Hydrate with an oral rehydration solution that you can purchase at pharmacies, retail stores, and online. You can prepare an oral rehydration solution at home by mixing the following ingredients together:   - tsp table salt.   tsp baking soda.   tsp salt substitute containing potassium chloride.  1  tablespoons sugar.  1 L (34 oz) of water.  Certain foods and beverages may increase the speed at which food  moves through the gastrointestinal (GI) tract. These foods and beverages should be avoided and include:  Caffeinated and alcoholic beverages.  High-fiber foods, such as raw fruits and vegetables, nuts, seeds, and whole grain breads and cereals.  Foods and beverages sweetened with sugar alcohols, such as xylitol, sorbitol, and mannitol.  Some foods may be well tolerated and may help thicken stool including:  Starchy foods, such as rice, toast, pasta, low-sugar cereal, oatmeal, grits, baked potatoes, crackers, and bagels.  Bananas.  Applesauce.  Add probiotic-rich foods to help increase healthy bacteria in the GI tract, such as yogurt and fermented milk products.  Wash your hands well after each diarrhea episode.  Only take over-the-counter or prescription medicines as directed by your caregiver.  Take a warm bath to relieve any burning or pain from frequent diarrhea episodes. SEEK IMMEDIATE MEDICAL CARE IF:   You are unable to keep fluids down.  You have persistent vomiting.  You have blood in your stool, or your stools are black and tarry.  You do not urinate in 6-8 hours, or there is only a small amount of very dark urine.  You have abdominal pain that increases or localizes.  You have weakness, dizziness, confusion, or light-headedness.  You have a severe headache.  Your diarrhea gets worse or does not get better.  You have a fever or persistent symptoms for more than 2-3 days.  You have a fever  and your symptoms suddenly get worse. MAKE SURE YOU:   Understand these instructions.  Will watch your condition.  Will get help right away if you are not doing well or get worse.   This information is not intended to replace advice given to you by your health care provider. Make sure you discuss any questions you have with your health care provider.   Document Released: 01/21/2002 Document Revised: 02/21/2014 Document Reviewed: 10/09/2011 Elsevier Interactive Patient  Education [employer]

## 2015-05-20 NOTE — ED Notes (Addendum)
Pt c/o abd pain, n/v, chills that started Saturday, denies any recent sick exposures, states that she has not been able to keep anything down since Saturday, pt abd tender to palpation,  Dr Joni FearsLui at bedside, see edp assessment for further,

## 2015-05-26 ENCOUNTER — Ambulatory Visit: Payer: Medicaid Other | Admitting: Family Medicine

## 2015-05-27 ENCOUNTER — Encounter: Payer: Self-pay | Admitting: Family Medicine

## 2015-06-01 ENCOUNTER — Other Ambulatory Visit: Payer: Self-pay

## 2015-06-01 DIAGNOSIS — F39 Unspecified mood [affective] disorder: Secondary | ICD-10-CM

## 2015-06-01 MED ORDER — VENLAFAXINE HCL 75 MG PO TABS: 75.0000 mg | ORAL_TABLET | Freq: Two times a day (BID) | ORAL | Status: DC

## 2015-06-16 ENCOUNTER — Encounter: Payer: Self-pay | Admitting: Family Medicine

## 2015-06-16 ENCOUNTER — Encounter (INDEPENDENT_AMBULATORY_CARE_PROVIDER_SITE_OTHER): Payer: Medicaid Other | Admitting: Family Medicine

## 2015-06-16 ENCOUNTER — Other Ambulatory Visit: Payer: Medicaid Other

## 2015-06-16 ENCOUNTER — Encounter (INDEPENDENT_AMBULATORY_CARE_PROVIDER_SITE_OTHER): Payer: Self-pay

## 2015-06-16 DIAGNOSIS — Z7251 High risk heterosexual behavior: Secondary | ICD-10-CM

## 2015-06-16 NOTE — Progress Notes (Signed)
Erroneous - here for lab only

## 2015-06-16 NOTE — Addendum Note (Signed)
Addended by: Bearl MulberryUTHERFORD, NATALIE K on: 06/16/2015 01:51 PM   Modules accepted: Orders

## 2015-06-17 LAB — HIV ANTIBODY (ROUTINE TESTING W REFLEX): HIV Screen 4th Generation wRfx: NONREACTIVE

## 2015-06-30 ENCOUNTER — Other Ambulatory Visit: Payer: Self-pay | Admitting: Family Medicine

## 2015-07-03 ENCOUNTER — Encounter: Payer: Self-pay | Admitting: Family Medicine

## 2015-07-29 ENCOUNTER — Other Ambulatory Visit: Payer: Self-pay | Admitting: Family Medicine

## 2015-08-05 ENCOUNTER — Other Ambulatory Visit: Payer: Self-pay | Admitting: Family Medicine

## 2015-08-10 DIAGNOSIS — M1712 Unilateral primary osteoarthritis, left knee: Secondary | ICD-10-CM | POA: Insufficient documentation

## 2015-09-01 ENCOUNTER — Telehealth: Payer: Self-pay

## 2015-09-01 DIAGNOSIS — Z87440 Personal history of urinary (tract) infections: Secondary | ICD-10-CM

## 2015-09-01 NOTE — Telephone Encounter (Signed)
Needs an Medicaid referral to my Urologist that I've been seeing

## 2015-09-02 NOTE — Telephone Encounter (Signed)
Your call as PCP... Thanks, WS

## 2015-09-02 NOTE — Telephone Encounter (Signed)
Referral written, will forward to referral coordinator.   Murtis SinkSam Bradshaw, MD [facility] 09/02/2015, 10:41 AM

## 2015-09-03 ENCOUNTER — Other Ambulatory Visit: Payer: Self-pay | Admitting: *Deleted

## 2015-09-03 MED ORDER — ARIPIPRAZOLE 5 MG PO TABS: ORAL_TABLET | ORAL | Status: DC

## 2015-09-07 ENCOUNTER — Other Ambulatory Visit: Payer: Self-pay | Admitting: Family Medicine

## 2015-09-08 ENCOUNTER — Ambulatory Visit (INDEPENDENT_AMBULATORY_CARE_PROVIDER_SITE_OTHER): Payer: Medicaid Other | Admitting: Family Medicine

## 2015-09-08 VITALS — BP 130/89 | HR 88 | Temp 98.2°F | Ht 69.0 in | Wt 380.4 lb

## 2015-09-08 DIAGNOSIS — K8012 Calculus of gallbladder with acute and chronic cholecystitis without obstruction: Secondary | ICD-10-CM

## 2015-09-08 DIAGNOSIS — R300 Dysuria: Secondary | ICD-10-CM | POA: Diagnosis not present

## 2015-09-08 DIAGNOSIS — A09 Infectious gastroenteritis and colitis, unspecified: Secondary | ICD-10-CM | POA: Diagnosis not present

## 2015-09-08 DIAGNOSIS — Z7251 High risk heterosexual behavior: Secondary | ICD-10-CM

## 2015-09-08 DIAGNOSIS — R197 Diarrhea, unspecified: Secondary | ICD-10-CM

## 2015-09-08 LAB — URINALYSIS, COMPLETE
Bilirubin, UA: NEGATIVE
Glucose, UA: NEGATIVE
KETONES UA: NEGATIVE
Nitrite, UA: NEGATIVE
PROTEIN UA: NEGATIVE
RBC, UA: NEGATIVE
SPEC GRAV UA: 1.02 (ref 1.005–1.030)
Urobilinogen, Ur: 0.2 mg/dL (ref 0.2–1.0)
pH, UA: 7 (ref 5.0–7.5)

## 2015-09-08 LAB — MICROSCOPIC EXAMINATION: RBC, UA: NONE SEEN /hpf (ref 0–?)

## 2015-09-08 MED ORDER — TRAMADOL HCL 50 MG PO TABS: 50.0000 mg | ORAL_TABLET | Freq: Four times a day (QID) | ORAL | Status: DC | PRN

## 2015-09-08 MED ORDER — METRONIDAZOLE 500 MG PO TABS: 500.0000 mg | ORAL_TABLET | Freq: Three times a day (TID) | ORAL | 0 refills | Status: DC

## 2015-09-08 NOTE — Progress Notes (Signed)
Subjective:  Patient ID: Alice [name], female    DOB: 1985/03/02  Age: 30 y.o. MRN: [id number]  CC: Abdominal Pain (RUQ, intermitent diarrhea with constipation, nausea) and STD check   HPI Alice See ([place]) presents for Increasing right upper quadrant pain present for several days. Intermittently has been going on for several months. She's been in the emergency room and had CTs and ultrasounds that it been negative. However she was told several years ago that her gallbladder would eventually have to come out because of stones. The patient is primarily a stabbing and throbbing. It radiates to the right shoulder and right ankle of the scapula. It is accompanied by excessive flatulence and belching.  Additionally the patient reports that she caught her boyfriend cheating. She would like to be checked for STDs because she's had some itching and discharge in the vaginal area for several days. 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   She has a past surgical history that includes Nephrectomy; Nasal sinus surgery; Tonsillectomy; Other surgical history; addenoidectomy; and Cesarean section (N/A, 02/22/2013).   Her family history includes Alcohol abuse in her brother and father; Diabetes in her maternal grandmother; Heart disease in her father; Hyperlipidemia in her maternal grandmother and mother; Hypertension in her mother.She reports that she has been smoking Cigarettes.  She has a 0.25 pack-year smoking history. She has never used smokeless tobacco. She reports that she does not drink alcohol or  use drugs.    ROS Review of Systems  Constitutional: Negative for activity change, appetite change and fever.  HENT: Negative for congestion, rhinorrhea and sore throat.   Eyes: Negative for visual disturbance.  Respiratory: Negative for cough and shortness of breath.   Cardiovascular: Negative for chest pain and palpitations.  Gastrointestinal: Positive for abdominal pain. Negative for diarrhea and nausea.  Genitourinary: Positive for dysuria and vaginal discharge. Negative for flank pain, genital sores and pelvic pain.  Musculoskeletal: Negative for arthralgias and myalgias.    Objective:  BP 130/89   Pulse 88   Temp 98.2 F (36.8 C) (Oral)   Ht '5\' 9"'$  (1.753 m)   Wt (!) 380 lb 6.4 oz (172.5 kg)   SpO2 98%   BMI 56.18 kg/m   BP Readings from Last 3 Encounters:  09/08/15 130/89  06/16/15 129/89  05/20/15 147/96    Wt Readings from Last 3 Encounters:  09/08/15 (!) 380 lb 6.4 oz (172.5 kg)  06/16/15 (!) 380 lb 9.6 oz (172.6 kg)  05/20/15 (!) 360 lb (163.3 kg)     Physical Exam  Constitutional: She is oriented to person, place, and time. She appears well-developed and well-nourished. No distress.  HENT:  Head: Normocephalic and atraumatic.  Right Ear: External ear normal.  Left Ear: External ear normal.  Nose: Nose normal.  Mouth/Throat: Oropharynx is clear and moist.  Eyes: Conjunctivae and EOM are normal. Pupils are equal, round, and reactive to light.  Neck: Normal range of motion. Neck supple. No thyromegaly present.  Cardiovascular: Normal rate, regular rhythm and normal heart sounds.   No murmur heard. Pulmonary/Chest: Effort normal and breath sounds normal. No respiratory distress. She has no wheezes.  She has no rales.  Abdominal: Soft. Bowel sounds are normal. She exhibits no distension and no mass. There is tenderness (RUQ, moderate ). There is no rebound and no guarding.  Lymphadenopathy:    She has no cervical adenopathy.  Neurological: She is alert and  oriented to person, place, and time. She has normal reflexes.  Skin: Skin is warm and dry.  Psychiatric: She has a normal mood and affect. Her behavior is normal. Judgment and thought content normal.     Lab Results  Component Value Date   WBC 10.2 09/08/2015   HGB 14.6 05/20/2015   HCT 44.6 09/08/2015   PLT 301 09/08/2015   GLUCOSE 88 09/08/2015   ALT 30 09/08/2015   AST 23 09/08/2015   NA 141 09/08/2015   K 4.5 09/08/2015   CL 103 09/08/2015   CREATININE 0.78 09/08/2015   BUN 10 09/08/2015   CO2 21 09/08/2015    Ct Abdomen Pelvis W Contrast  Result Date: 05/20/2015 CLINICAL DATA:  Right lower quadrant pain EXAM: CT ABDOMEN AND PELVIS WITH CONTRAST TECHNIQUE: Multidetector CT imaging of the abdomen and pelvis was performed using the standard protocol following bolus administration of intravenous contrast. CONTRAST:  ISOVUE-300 IOPAMIDOL (ISOVUE-300) INJECTION 61% COMPARISON:  04/08/2015 FINDINGS: Lung bases are free of acute infiltrate or sizable effusion. The liver is diffusely fatty infiltrated. The gallbladder, spleen, adrenal glands and pancreas are within normal limits. The left kidney is well visualized with a normal enhancement pattern. The right kidney has been surgically removed. The appendix is well visualized and within normal limits. No inflammatory changes are seen. The bladder is decompressed. The uterus and ovaries are unremarkable. No significant diverticular change is noted. The osseous structures show no acute abnormality. IMPRESSION: No acute abnormality noted. No significant change from the prior exam. Electronically Signed   By: Alcide Clever M.D.   On: 05/20/2015 21:53    Assessment & Plan:   Alice was seen today for abdominal pain and std check.  Diagnoses and all orders for this visit:  High risk sexual behavior -     GC/Chlamydia Probe Amp  Dysuria -     Urinalysis, Complete  Calculus of gallbladder with acute on chronic cholecystitis without  obstruction -     NM Hepato W/Eject Fract; Future  Diarrhea of presumed infectious origin -     CBC with Differential/Platelet -     CMP14+EGFR -     Amylase -     Lipase -     Stool culture -     Cdiff NAA+O+P+Stool Culture  Other orders -     Discontinue: metroNIDAZOLE (FLAGYL) 500 MG tablet; Take 1 tablet (500 mg total) by mouth 3 (three) times daily. -     traMADol (ULTRAM) 50 MG tablet; Take 1 tablet (50 mg total) by mouth 4 (four) times daily as needed for moderate pain. -     Microscopic Examination    UA - moderate bacteria & 6-10 WBC    Meds ordered this encounter  Medications  . promethazine (PHENERGAN) 25 MG tablet    Sig: TAKE 1 TABLET (25 MG TOTAL) BY MOUTH EVERY 6 (SIX) HOURS AS NEEDED FOR NAUSEA.  . DISCONTD: metroNIDAZOLE (FLAGYL) 500 MG tablet    Sig: Take 1 tablet (500 mg total) by mouth 3 (three) times daily.    Dispense:  21 tablet    Refill:  0  . traMADol (ULTRAM) 50 MG tablet    Sig: Take 1 tablet (50  mg total) by mouth 4 (four) times daily as needed for moderate pain.    Dispense:  60 tablet    Refill:  02     Follow-up: Return if symptoms worsen or fail to improve.  Claretta Fraise, M.D.

## 2015-09-09 ENCOUNTER — Other Ambulatory Visit: Payer: Medicaid Other

## 2015-09-09 LAB — CBC WITH DIFFERENTIAL/PLATELET
BASOS ABS: 0 10*3/uL (ref 0.0–0.2)
Basos: 0 %
EOS (ABSOLUTE): 0.5 10*3/uL — AB (ref 0.0–0.4)
Eos: 5 %
Hematocrit: 44.6 % (ref 34.0–46.6)
Hemoglobin: 15 g/dL (ref 11.1–15.9)
Immature Grans (Abs): 0.1 10*3/uL (ref 0.0–0.1)
Immature Granulocytes: 1 %
LYMPHS ABS: 2.9 10*3/uL (ref 0.7–3.1)
Lymphs: 28 %
MCH: 29.1 pg (ref 26.6–33.0)
MCHC: 33.6 g/dL (ref 31.5–35.7)
MCV: 87 fL (ref 79–97)
Monocytes Absolute: 0.7 10*3/uL (ref 0.1–0.9)
Monocytes: 7 %
NEUTROS ABS: 6.1 10*3/uL (ref 1.4–7.0)
Neutrophils: 59 %
PLATELETS: 301 10*3/uL (ref 150–379)
RBC: 5.15 x10E6/uL (ref 3.77–5.28)
RDW: 14.1 % (ref 12.3–15.4)
WBC: 10.2 10*3/uL (ref 3.4–10.8)

## 2015-09-09 LAB — CMP14+EGFR
ALK PHOS: 85 IU/L (ref 39–117)
ALT: 30 IU/L (ref 0–32)
AST: 23 IU/L (ref 0–40)
Albumin/Globulin Ratio: 1.4 (ref 1.2–2.2)
Albumin: 4 g/dL (ref 3.5–5.5)
BILIRUBIN TOTAL: 0.3 mg/dL (ref 0.0–1.2)
BUN / CREAT RATIO: 13 (ref 9–23)
BUN: 10 mg/dL (ref 6–20)
CHLORIDE: 103 mmol/L (ref 96–106)
CO2: 21 mmol/L (ref 18–29)
Calcium: 8.7 mg/dL (ref 8.7–10.2)
Creatinine, Ser: 0.78 mg/dL (ref 0.57–1.00)
GFR calc Af Amer: 117 mL/min/{1.73_m2} (ref >59)
GFR calc non Af Amer: 102 mL/min/{1.73_m2} (ref >59)
GLUCOSE: 88 mg/dL (ref 65–99)
Globulin, Total: 2.9 g/dL (ref 1.5–4.5)
Potassium: 4.5 mmol/L (ref 3.5–5.2)
Sodium: 141 mmol/L (ref 134–144)
Total Protein: 6.9 g/dL (ref 6.0–8.5)

## 2015-09-09 LAB — AMYLASE: AMYLASE: 56 U/L (ref 31–124)

## 2015-09-09 LAB — LIPASE: Lipase: 25 U/L (ref 0–59)

## 2015-09-10 LAB — GC/CHLAMYDIA PROBE AMP
CHLAMYDIA, DNA PROBE: NEGATIVE
Neisseria gonorrhoeae by PCR: NEGATIVE

## 2015-09-11 ENCOUNTER — Encounter: Payer: Self-pay | Admitting: Family Medicine

## 2015-09-11 ENCOUNTER — Other Ambulatory Visit: Payer: Self-pay | Admitting: Family Medicine

## 2015-09-11 MED ORDER — DOXYCYCLINE HYCLATE 100 MG PO TABS: 100.0000 mg | ORAL_TABLET | Freq: Two times a day (BID) | ORAL | 0 refills | Status: DC

## 2015-09-11 MED ORDER — CLINDAMYCIN PHOSPHATE 2 % VA CREA: 1.0000 | TOPICAL_CREAM | Freq: Every day | VAGINAL | 0 refills | Status: DC

## 2015-09-12 ENCOUNTER — Encounter: Payer: Self-pay | Admitting: Family Medicine

## 2015-09-14 LAB — CDIFF NAA+O+P+STOOL CULTURE
E coli, Shiga toxin Assay: NEGATIVE
Toxigenic C. Difficile by PCR: NEGATIVE

## 2015-09-21 ENCOUNTER — Telehealth: Payer: Self-pay | Admitting: Family Medicine

## 2015-09-21 ENCOUNTER — Encounter (HOSPITAL_COMMUNITY): Payer: Self-pay

## 2015-09-21 ENCOUNTER — Encounter (HOSPITAL_COMMUNITY)
Admission: RE | Admit: 2015-09-21 | Discharge: 2015-09-21 | Disposition: A | Discharge status: Home / Self Care | Payer: Medicaid Other | Source: Ambulatory Visit | Attending: Family Medicine | Admitting: Family Medicine

## 2015-09-21 DIAGNOSIS — K8012 Calculus of gallbladder with acute and chronic cholecystitis without obstruction: Secondary | ICD-10-CM | POA: Insufficient documentation

## 2015-09-21 MED ORDER — TECHNETIUM TC 99M MEBROFENIN IV KIT
5.0000 | PACK | Freq: Once | INTRAVENOUS | Status: AC | PRN
  Administered 2015-09-21: 5 via INTRAVENOUS

## 2015-09-28 ENCOUNTER — Ambulatory Visit (HOSPITAL_COMMUNITY): Payer: Medicaid Other

## 2015-10-19 ENCOUNTER — Other Ambulatory Visit: Payer: Self-pay | Admitting: Family Medicine

## 2015-10-20 ENCOUNTER — Telehealth: Payer: Self-pay

## 2015-10-20 DIAGNOSIS — Z905 Acquired absence of kidney: Secondary | ICD-10-CM

## 2015-10-20 DIAGNOSIS — Z87440 Personal history of urinary (tract) infections: Secondary | ICD-10-CM

## 2015-10-20 NOTE — Telephone Encounter (Signed)
referral written   Murtis SinkSam Bradshaw, MD [facility] 10/20/2015, 5:42 PM

## 2015-10-21 NOTE — Telephone Encounter (Signed)
Patient aware.

## 2015-11-02 ENCOUNTER — Telehealth: Payer: Self-pay | Admitting: Family Medicine

## 2015-11-03 ENCOUNTER — Encounter (HOSPITAL_COMMUNITY): Payer: Self-pay | Admitting: Emergency Medicine

## 2015-11-03 ENCOUNTER — Ambulatory Visit: Payer: Medicaid Other | Admitting: Family Medicine

## 2015-11-03 ENCOUNTER — Emergency Department (HOSPITAL_COMMUNITY): Payer: Medicaid Other

## 2015-11-03 ENCOUNTER — Emergency Department (HOSPITAL_COMMUNITY)
Admission: EM | Admit: 2015-11-03 | Discharge: 2015-11-03 | Disposition: A | Discharge status: Home / Self Care | Payer: Medicaid Other | Attending: Emergency Medicine | Admitting: Emergency Medicine

## 2015-11-03 DIAGNOSIS — R05 Cough: Secondary | ICD-10-CM | POA: Diagnosis present

## 2015-11-03 DIAGNOSIS — J40 Bronchitis, not specified as acute or chronic: Secondary | ICD-10-CM

## 2015-11-03 DIAGNOSIS — F17210 Nicotine dependence, cigarettes, uncomplicated: Secondary | ICD-10-CM | POA: Diagnosis not present

## 2015-11-03 DIAGNOSIS — Z79899 Other long term (current) drug therapy: Secondary | ICD-10-CM | POA: Diagnosis not present

## 2015-11-03 LAB — BASIC METABOLIC PANEL
ANION GAP: 8 (ref 5–15)
BUN: 11 mg/dL (ref 6–20)
CO2: 23 mmol/L (ref 22–32)
Calcium: 8.8 mg/dL — ABNORMAL LOW (ref 8.9–10.3)
Chloride: 106 mmol/L (ref 101–111)
Creatinine, Ser: 0.79 mg/dL (ref 0.44–1.00)
GLUCOSE: 89 mg/dL (ref 65–99)
Potassium: 4.4 mmol/L (ref 3.5–5.1)
Sodium: 137 mmol/L (ref 135–145)

## 2015-11-03 LAB — CBC WITH DIFFERENTIAL/PLATELET
BASOS ABS: 0 10*3/uL (ref 0.0–0.1)
BASOS PCT: 0 %
EOS PCT: 6 %
Eosinophils Absolute: 0.5 10*3/uL (ref 0.0–0.7)
HEMATOCRIT: 45.8 % (ref 36.0–46.0)
Hemoglobin: 15.3 g/dL — ABNORMAL HIGH (ref 12.0–15.0)
Lymphocytes Relative: 33 %
Lymphs Abs: 2.4 10*3/uL (ref 0.7–4.0)
MCH: 29.3 pg (ref 26.0–34.0)
MCHC: 33.4 g/dL (ref 30.0–36.0)
MCV: 87.6 fL (ref 78.0–100.0)
MONO ABS: 0.8 10*3/uL (ref 0.1–1.0)
MONOS PCT: 11 %
Neutro Abs: 3.7 10*3/uL (ref 1.7–7.7)
Neutrophils Relative %: 50 %
PLATELETS: 263 10*3/uL (ref 150–400)
RBC: 5.23 MIL/uL — ABNORMAL HIGH (ref 3.87–5.11)
RDW: 14.5 % (ref 11.5–15.5)
WBC: 7.3 10*3/uL (ref 4.0–10.5)

## 2015-11-03 LAB — D-DIMER, QUANTITATIVE: D-Dimer, Quant: 0.46 ug/mL-FEU (ref 0.00–0.50)

## 2015-11-03 LAB — RAPID STREP SCREEN (MED CTR MEBANE ONLY): Streptococcus, Group A Screen (Direct): NEGATIVE

## 2015-11-03 LAB — POC URINE PREG, ED: Preg Test, Ur: NEGATIVE

## 2015-11-03 MED ORDER — AZITHROMYCIN 250 MG PO TABS: 250.0000 mg | ORAL_TABLET | Freq: Every day | ORAL | 0 refills | Status: DC

## 2015-11-03 MED ORDER — ALBUTEROL SULFATE HFA 108 (90 BASE) MCG/ACT IN AERS: 2.0000 | INHALATION_SPRAY | Freq: Four times a day (QID) | RESPIRATORY_TRACT | 0 refills | Status: DC | PRN

## 2015-11-03 MED ORDER — IPRATROPIUM-ALBUTEROL 0.5-2.5 (3) MG/3ML IN SOLN
3.0000 mL | Freq: Once | RESPIRATORY_TRACT | Status: AC
  Administered 2015-11-03: 3 mL via RESPIRATORY_TRACT
  Filled 2015-11-03: qty 3

## 2015-11-03 MED ORDER — SODIUM CHLORIDE 0.9 % IV BOLUS (SEPSIS)
1000.0000 mL | Freq: Once | INTRAVENOUS | Status: AC
  Administered 2015-11-03: 1000 mL via INTRAVENOUS

## 2015-11-03 NOTE — ED Provider Notes (Signed)
[facility]-EM[facility]Y DEPT Provider Note   CSN: [id number] Arrival date & time: 11/03/15  1043  By signing my name below, I, Sandrea Hammond, attest that this documentation has been prepared under the direction and in the presence of [name], MD. Electronically Signed: Sandrea Hammond, ED Scribe. 11/03/15. 2:10 PM.   History   Chief Complaint Chief Complaint  Patient presents with  . Shortness of Breath    HPI Comments: Alice [name] is a 30 y.o. female who presents to the Emergency Department complaining of an intermittent cough with brown-colored sputum for the past 5 days. She has associated SOB with walking along with generalized myalgias, rhinorrhea, sore throat, diarrhea, and sinus pressure. She reports some pain with swallowing. She has sick contacts at home with similar symptoms. She has taken Tylenol and Alka-seltzer without relief. She is a 0.5 ppd smoker. She denies fever, nausea, vomiting, CP, abdominal pain. She denies history of asthma or COPD. She reports recent long distance travel but denies birth control use. Pt says she has had episodes of bacterial and viral pneumonia in the past as well renal dysfunction due to kidney disease although she denies kidney stents.    The history is provided by the patient. No language interpreter was used.    Past Medical History:  Diagnosis Date  . Chronic left flank pain   . Chronic pain   . Congenital obstruction of ureteropelvic junction (UPJ)   . 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 GERD (gastroesophageal reflux disease)   . History of kidney stones   . History of recurrent UTIs   . Loin pain hematuria syndrome    Dr. [name] at [facility]  . Morbid obesity [facility])   . Preterm labor   . Round ligament pain 09/10/2012  . Torn ACL     Patient Active Problem List   Diagnosis Date Noted  . Mood disorder (HCC) 12/30/2014  . Smoker 05/13/2014  . Post-operative state 02/22/2013  . Elevated liver function  tests 09/24/2012  . 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 History of recurrent UTI (urinary tract infection) 07/23/2012  . Obesity, morbid, BMI 50 or higher (HCC) 07/23/2012  . H/O preterm delivery, currently pregnant 07/23/2012    Past Surgical History:  Procedure Laterality Date  . addenoidectomy    . CESAREAN SECTION N/A 02/22/2013   Procedure: CESAREAN SECTION/TWINS;  Surgeon: [name], MD;  Location: [facility];  Service: Obstetrics;  Laterality: N/A;  . NASAL SINUS SURGERY    . NEPHRECTOMY     right side  . OTHER SURGICAL HISTORY     ulner nerve removal  . TONSILLECTOMY      OB History    Gravida Para Term Preterm AB Living   2 2 1 1   3    SAB TAB Ectopic Multiple Live Births         1 3       Home Medications    Prior to Admission medications   Medication Sig Start Date End Date Taking? Authorizing Provider  omeprazole (PRILOSEC) 40 MG capsule TAKE 1 CAPSULE (40 MG TOTAL) BY MOUTH DAILY. 07/01/15  Yes Mechele Claude, MD  promethazine (PHENERGAN) 25 MG tablet TAKE 1 TABLET (25 MG TOTAL) BY MOUTH EVERY 6 (SIX) HOURS AS NEEDED FOR NAUSEA. 08/24/15  Yes Historical Provider, MD  traMADol (ULTRAM) 50 MG tablet Take 1 tablet (50 mg total) by mouth 4 (four) times daily as needed for moderate pain. 09/08/15  Yes Broadus John  Stacks, MD  venlafaxine (EFFEXOR) 75 MG tablet TAKE 1 TABLET (75 MG TOTAL) BY MOUTH 2 (TWO) TIMES DAILY. 10/20/15  Yes Mechele Claude, MD    Family History Family History  Problem Relation Age of Onset  . Hypertension Mother   . Hyperlipidemia Mother   . Heart disease Father     MI  . Alcohol abuse Father   . Alcohol abuse Brother   . Diabetes Maternal Grandmother   . Hyperlipidemia Maternal Grandmother     Social History Social History  Substance Use Topics  . Smoking status: Current Every Day Smoker    Packs/day: 0.50    Years: 0.50    Types: Cigarettes    Last attempt to quit: 06/21/2012  . Smokeless tobacco: Never Used  . Alcohol use No      Allergies   Cinnamon flavor; Codeine; Flomax [tamsulosin]; Ibuprofen; Levofloxacin; and Reglan [metoclopramide]   Review of Systems Review of Systems    A complete 10 system review of systems was obtained and all systems are negative except as noted in the HPI and PMH.    Physical Exam Updated Vital Signs BP 142/76   Pulse 92   Temp 97.7 F (36.5 C) (Oral)   Resp 18   Ht 5\' 8"  (1.727 m)   Wt (!) 380 lb (172.4 kg)   LMP 10/24/2015   SpO2 95%   BMI 57.78 kg/m   Physical Exam  Constitutional: She is oriented to person, place, and time. She appears well-developed and well-nourished. No distress.  Morbidly obese  HENT:  Head: Normocephalic and atraumatic.  Mouth/Throat: No oropharyngeal exudate.  Mild oropharyngeal erythema  Eyes: Conjunctivae and EOM are normal. Pupils are equal, round, and reactive to light.  Neck: Normal range of motion. Neck supple.  No meningismus.  Cardiovascular: Normal rate, regular rhythm, normal heart sounds and intact distal pulses.   No murmur heard. Pulmonary/Chest: Effort normal. No respiratory distress. She has no wheezes. She has no rales.  Pt speaks in full sentences and has a moist cough. Lungs CTA.  Abdominal: Soft. There is no tenderness. There is no rebound and no guarding.  Musculoskeletal: Normal range of motion. She exhibits no edema or tenderness.  Neurological: She is alert and oriented to person, place, and time. No cranial nerve deficit. She exhibits normal muscle tone. Coordination normal.  No ataxia on finger to nose bilaterally. No pronator drift. 5/5 strength throughout. CN 2-12 intact.Equal grip strength. Sensation intact.   Skin: Skin is warm.  Psychiatric: She has a normal mood and affect. Her behavior is normal.  Nursing note and vitals reviewed.    ED Treatments / Results   DIAGNOSTIC STUDIES: Oxygen Saturation is 98% on RA, normal by my interpretation.    COORDINATION OF CARE: 12:57 PM Discussed treatment  plan with pt at bedside and pt agreed to plan.   Labs (all labs ordered are listed, but only abnormal results are displayed) Labs Reviewed  RAPID STREP SCREEN (NOT AT [facility])  CBC WITH DIFFERENTIAL/PLATELET  BASIC METABOLIC PANEL  D-DIMER, QUANTITATIVE (NOT AT [facility]  POC URINE PREG, ED    EKG  EKG Interpretation  Date/Time:  Tuesday November 03 2015 11:04:02 EDT Ventricular Rate:  112 PR Interval:  142 QRS Duration: 70 QT Interval:  326 QTC Calculation: 444 R Axis:   61 Text Interpretation:  Sinus tachycardia Otherwise normal ECG Rate faster Confirmed by Manus Gunning  MD, Needham Biggins ([id number]) on 11/03/2015 1:28:41 PM       Radiology Dg Chest  2 View  Result Date: 11/03/2015 CLINICAL DATA:  Short of breath for several days, cough, congestion, smoking history EXAM: CHEST  2 VIEW COMPARISON:  Chest x-ray of 05/16/2013 FINDINGS: No active infiltrate or effusion is seen. Mediastinal and hilar contours are unremarkable. The heart is within normal limits in size. No bony abnormality is seen. IMPRESSION: No active cardiopulmonary disease. Electronically Signed   By: Dwyane DeePaul  Barry M.D.   On: 11/03/2015 11:15    Procedures Procedures (including critical care time)  Medications Ordered in ED Medications  sodium chloride 0.9 % bolus 1,000 mL (0 mLs Intravenous Paused 11/03/15 1407)  ipratropium-albuterol (DUONEB) 0.5-2.5 (3) MG/3ML nebulizer solution 3 mL (3 mLs Nebulization Given 11/03/15 1344)     Initial Impression / Assessment and Plan / ED Course  I have reviewed the triage vital signs and the nursing notes.  Pertinent labs & imaging results that were available during my care of the patient were reviewed by me and considered in my medical decision making (see chart for details).  Clinical Course   Patient presents with a five-day history of shortness of breath, cough and chest pain with coughing and subjective chills. States cough productive of clear mucus.  No wheezing. Good air  exchange on exam. Chest x-ray is negative.  Rapid strep neg. D-dimer negative  Patient ambulatory without desaturation. We'll treat for bronchitis. Patient encouraged to stop smoking. Return precautions discussed.  Final Clinical Impressions(s) / ED Diagnoses   Final diagnoses:  Bronchitis    New Prescriptions New Prescriptions   No medications on file   I personally performed the services described in this documentation, which was scribed in my presence. The recorded information has been reviewed and is accurate.    Glynn OctaveStephen Gery Sabedra, MD 11/03/15 [id number]

## 2015-11-03 NOTE — ED Notes (Signed)
EDP at bedside updating patient and family. 

## 2015-11-03 NOTE — ED Triage Notes (Signed)
Pt is talking in full sentences.

## 2015-11-03 NOTE — ED Notes (Signed)
POC urine preg was negative.  

## 2015-11-03 NOTE — ED Notes (Signed)
[facility]. Will notify EDP.

## 2015-11-03 NOTE — ED Triage Notes (Signed)
Pt states has had SOB since Sunday. Doctor's office couldn't see her.  Pt states that she has had a cough since Thursday and is congested. Pt states that her mucus is brown.  Pt was able to ambulate to triage.

## 2015-11-03 NOTE — ED Notes (Signed)
Ambulated pt with pulse ox. o2 sats 98-100% on RA. HR 80-90bpm

## 2015-11-03 NOTE — ED Notes (Signed)
Rapid Strep = NEGATIVE

## 2015-11-03 NOTE — Discharge Instructions (Signed)
Stop smoking. Followup with your doctor this week. Return to the ED if you develop new or worsening symptoms.

## 2015-11-05 ENCOUNTER — Ambulatory Visit: Payer: Medicaid Other | Admitting: Family Medicine

## 2015-11-05 ENCOUNTER — Encounter: Payer: Self-pay | Admitting: Family Medicine

## 2015-11-06 LAB — CULTURE, GROUP A STREP (THRC)

## 2015-11-24 ENCOUNTER — Other Ambulatory Visit (HOSPITAL_COMMUNITY)
Admission: RE | Admit: 2015-11-24 | Discharge: 2015-11-24 | Disposition: A | Discharge status: Home / Self Care | Payer: Medicaid Other | Source: Ambulatory Visit | Attending: Adult Health | Admitting: Adult Health

## 2015-11-24 ENCOUNTER — Encounter: Payer: Self-pay | Admitting: Adult Health

## 2015-11-24 ENCOUNTER — Ambulatory Visit (INDEPENDENT_AMBULATORY_CARE_PROVIDER_SITE_OTHER): Payer: Medicaid Other | Admitting: Adult Health

## 2015-11-24 VITALS — BP 144/110 | HR 68 | Ht 66.5 in | Wt 375.0 lb

## 2015-11-24 DIAGNOSIS — Z3202 Encounter for pregnancy test, result negative: Secondary | ICD-10-CM | POA: Diagnosis not present

## 2015-11-24 DIAGNOSIS — Z01419 Encounter for gynecological examination (general) (routine) without abnormal findings: Secondary | ICD-10-CM

## 2015-11-24 DIAGNOSIS — F3289 Other specified depressive episodes: Secondary | ICD-10-CM

## 2015-11-24 DIAGNOSIS — Z0000 Encounter for general adult medical examination without abnormal findings: Secondary | ICD-10-CM

## 2015-11-24 DIAGNOSIS — Z1151 Encounter for screening for human papillomavirus (HPV): Secondary | ICD-10-CM | POA: Diagnosis present

## 2015-11-24 DIAGNOSIS — Z113 Encounter for screening for infections with a predominantly sexual mode of transmission: Secondary | ICD-10-CM | POA: Insufficient documentation

## 2015-11-24 DIAGNOSIS — Z3200 Encounter for pregnancy test, result unknown: Secondary | ICD-10-CM

## 2015-11-24 DIAGNOSIS — I10 Essential (primary) hypertension: Secondary | ICD-10-CM

## 2015-11-24 LAB — POCT URINE PREGNANCY: PREG TEST UR: NEGATIVE

## 2015-11-24 NOTE — Patient Instructions (Addendum)
Physical in 1 year, pap in 3 if normal [facility] and [facility] Follow up with PCP

## 2015-11-24 NOTE — Progress Notes (Signed)
Patient ID: Shelia MediaBrittany L Wipperfurth, female   DOB: [birth year], 30 y.o.   MRN: [id number] History of Present Illness: Lowanda FosterBrittany is a 30 year old white female in for a well woman gyn exam and pap.She is having some pain today in flank area, only has 1 kidney and is going [facility], her urologist is Dr Nicholes Calamityichard Natalie in [place].She has been sexually active without protection.She says breast are tender.  PCP is [facility].   Current Medications, Allergies, Past Medical History, Past Surgical History, Family History and Social History were reviewed in [facility] electronic medical record.     Review of Systems: Patient denies any headaches, hearing loss, fatigue, blurred vision, shortness of breath, chest pain, abdominal pain, problems with bowel movements, urination, or intercourse. No joint pain or mood swings.See HPI for positives.    Physical Exam:BP (!) 144/110   Pulse 68   Ht 5' 6.5" (1.689 m)   Wt (!) 375 lb (170.1 kg)   LMP 11/10/2015   BMI 59.62 kg/m UPT negative. General:  Well developed, well nourished, no acute distress Skin:  Warm and dry Neck:  Midline trachea, normal thyroid, good ROM, no lymphadenopathy Lungs; Clear to auscultation bilaterally Breast:  No dominant palpable mass, retraction, or nipple discharge Cardiovascular: Regular rate and rhythm Abdomen:  Soft, non tender, no hepatosplenomegaly,obese Pelvic:  External genitalia is normal in appearance, no lesions.  The vagina is normal in appearance. Urethra has no lesions or masses. The cervix is bulbous,pap with HPV and GC/CHL performed.  Uterus is felt to be normal size, shape, and contour.  No adnexal masses or tenderness noted.Bladder is non tender, no masses felt Extremities/musculoskeletal:  No swelling or varicosities noted, no clubbing or cyanosis Psych:  No mood changes, alert and cooperative,seems happy PHQ 9 score 9, is on Effexor. Discussed that she needs BP meds, like  ARB or ACEI, but will not prescribe today, as not to interfere with any tests [facility] may perform, did discuss with Dr Despina HiddenEure.She also needs to talk with them or PCP about adding meds to Effexor for depression. [facility] today to rule out pregnancy, she was informed being pregnant now is not ideal.  Impression: 1. Encounter for routine gynecological examination with Papanicolaou smear of cervix   2. Pregnancy test negative   3. Hypertension, unspecified type   4. Other depression   5. Unconfirmed pregnancy       Plan[facility], will talk in am and will address trying to stop smoking then  Physical in 1 year, pap in 3 if normal [facility] and BP Follow up with PCP

## 2015-11-25 LAB — BETA HCG QUANT (REF LAB): hCG Quant: <1 m[IU]/mL

## 2015-11-26 LAB — CYTOLOGY - PAP

## 2015-12-21 ENCOUNTER — Encounter: Payer: Self-pay | Admitting: Pediatrics

## 2015-12-21 ENCOUNTER — Ambulatory Visit (INDEPENDENT_AMBULATORY_CARE_PROVIDER_SITE_OTHER): Payer: Medicaid Other | Admitting: Pediatrics

## 2015-12-21 VITALS — BP 137/89 | HR 111 | Temp 98.0°F | Ht 66.5 in | Wt 349.6 lb

## 2015-12-21 DIAGNOSIS — J019 Acute sinusitis, unspecified: Secondary | ICD-10-CM

## 2015-12-21 DIAGNOSIS — R040 Epistaxis: Secondary | ICD-10-CM | POA: Diagnosis not present

## 2015-12-21 DIAGNOSIS — H6502 Acute serous otitis media, left ear: Secondary | ICD-10-CM

## 2015-12-21 MED ORDER — DOXYCYCLINE HYCLATE 100 MG PO TABS
100.0000 mg | ORAL_TABLET | Freq: Two times a day (BID) | ORAL | 0 refills | Status: AC
Start: 2015-12-21 — End: 2015-12-28

## 2015-12-21 NOTE — Progress Notes (Signed)
  Subjective:   Patient ID: Alice [name], female    DOB: 05-16-1985, 30 y.o.   MRN: [id number] CC: Epistaxis (This morning 9-5pm with clots)  HPI: Alice [name] is a 30 y.o. female presenting for Epistaxis (This morning 9-5pm with clots)  Kept putting tissues up nose, would have blood on them  Last nose bleed several years ago Has been hoarse for past 2 weeks Coughing up some mucus No lightheadedness, no dizziness  Ongoing sinus congestion for several weeks Worsened past two weeks Doesn't think she is having any fevers Pressure on R side of her face Wasn't sure if bleeding coming from her sinuses or her nose No bleeding down the back of her throat Often has nose bleeds this time of year when heat starts  Not using steroid nose sprays  Relevant past medical, surgical, family and social history reviewed. Allergies and medications reviewed and updated. History  Smoking Status  . Current Every Day Smoker  . Packs/day: 0.50  . Years: 0.50  . Types: Cigarettes  . Last attempt to quit: 06/21/2012  Smokeless Tobacco  . Never Used   ROS: Per HPI   Objective:    BP 137/89   Pulse (!) 111   Temp 98 F (36.7 C) (Oral)   Ht 5' 6.5" (1.689 m)   Wt (!) 349 lb 9.6 oz (158.6 kg)   LMP 11/10/2015   BMI 55.58 kg/m   Wt Readings from Last 3 Encounters:  12/21/15 (!) 349 lb 9.6 oz (158.6 kg)  11/24/15 (!) 375 lb (170.1 kg)  11/03/15 (!) 380 lb (172.4 kg)    Gen: NAD, alert, cooperative with exam, NCAT EYES: EOMI, no conjunctival injection, or no icterus ENT:  L TM red, retracted, R TM slightly pink, nl LR, slightly tender over max sinuses b/l, OP without erythema, excoriation visible b/l nasal septum LYMPH: no cervical LAD CV: NRRR, normal S1/S2, no murmur Resp: CTABL, no wheezes, normal WOB Neuro: Alert and oriented, strength equal b/l UE and LE, coordination grossly normal MSK: normal muscle bulk  Assessment & Plan:  Alice [name] was seen today for epistaxis, nasal  congestion, ear pain.  Diagnoses and all orders for this visit:  Acute sinusitis, recurrence not specified, unspecified location -     doxycycline (VIBRA-TABS) 100 MG tablet; Take 1 tablet (100 mg total) by mouth 2 (two) times daily. 1 po bid  Acute serous otitis media of left ear, recurrence not specified -     doxycycline (VIBRA-TABS) 100 MG tablet; Take 1 tablet (100 mg total) by mouth 2 (two) times daily. 1 po bid  Epistaxis BP slightly elevated today No further bleeding, no lightheadedness Excoriations visible Discussed symptomatic care vasoline to inside of nares Humidifier in bedroom, saline spray Let me know if returns  Low threshold for referrla to ENT  Follow up plan: 4 weeks for BP follow up Rex Krasarol Lillyahna Hemberger, MD [facility]

## 2015-12-24 ENCOUNTER — Other Ambulatory Visit: Payer: Self-pay | Admitting: Family Medicine

## 2016-01-18 ENCOUNTER — Ambulatory Visit: Payer: Medicaid Other | Admitting: Pediatrics

## 2016-01-22 ENCOUNTER — Other Ambulatory Visit: Payer: Self-pay | Admitting: Family Medicine

## 2016-03-19 ENCOUNTER — Other Ambulatory Visit: Payer: Self-pay | Admitting: Family Medicine

## 2016-04-19 ENCOUNTER — Other Ambulatory Visit: Payer: Self-pay | Admitting: Pediatrics

## 2016-05-11 ENCOUNTER — Encounter: Payer: Self-pay | Admitting: Family Medicine

## 2016-05-11 NOTE — Telephone Encounter (Signed)
Please arrange appt for pt. If sx are severe, send to the E.D. WS

## 2016-05-12 ENCOUNTER — Ambulatory Visit (INDEPENDENT_AMBULATORY_CARE_PROVIDER_SITE_OTHER): Payer: Medicaid Other | Admitting: Family Medicine

## 2016-05-12 ENCOUNTER — Encounter: Payer: Self-pay | Admitting: Family Medicine

## 2016-05-12 VITALS — BP 158/90 | HR 111 | Temp 97.8°F | Ht 66.5 in | Wt >= 6400 oz

## 2016-05-12 DIAGNOSIS — G43809 Other migraine, not intractable, without status migrainosus: Secondary | ICD-10-CM

## 2016-05-12 MED ORDER — METHYLPREDNISOLONE ACETATE 80 MG/ML IJ SUSP
80.0000 mg | Freq: Once | INTRAMUSCULAR | Status: AC
  Administered 2016-05-12: 80 mg via INTRAMUSCULAR

## 2016-05-12 MED ORDER — KETOROLAC TROMETHAMINE 60 MG/2ML IM SOLN
60.0000 mg | Freq: Once | INTRAMUSCULAR | Status: AC
  Administered 2016-05-12: 60 mg via INTRAMUSCULAR

## 2016-05-12 NOTE — Progress Notes (Signed)
   HPI  Patient presents today here with headaches  Patient does not normally have migraines.  She's had throbbing type headaches that are bilateral and worsening with bright lights or loud noises for about 4 days off and on. She states they're typically worse in the morning and improves throughout the day. She states that they're burning type pain across the for head in the morning and then by the afternoon her persistently throbbing type pain.  She's tried several over-the-counter medicines including Tylenol, Excedrin, ibuprofen, Motrin migraine, and Goody's powder with not much improvement.   PMH: Smoking status noted ROS: Per HPI  Objective: BP (!) 158/90   Pulse (!) 111   Temp 97.8 F (36.6 C) (Oral)   Ht 5' 6.5" (1.689 m)   Wt (!) 400 lb 9.6 oz (181.7 kg)   BMI 63.69 kg/m  Gen: NAD, alert, cooperative with exam HEENT: NCAT,, PERRLA, EOMI CV: RRR, good S1/S2, no murmur Resp: CTABL, no wheezes, non-labored Ext: No edema, warm Neuro: Alert and oriented, strength 5/5 and sensation intact in all 4 extremities  Assessment and plan:  # MIgraine Headache C/w with severe migraine HA Neuro exam normal Given IM Depo-Medrol and Toradol, recommended Phenergan when she gets home and get some rest. Return to clinic with any concerns or worsening symptoms   Meds ordered this encounter  Medications  . ketorolac (TORADOL) injection 60 mg  . methylPREDNISolone acetate (DEPO-MEDROL) injection 80 mg    Murtis SinkSam Elye Harmsen, MD [facility] 05/12/2016, 4:35 PM

## 2016-05-12 NOTE — Patient Instructions (Signed)
Great to see you!  Try a phenergan when you get home.    Migraine Headache A migraine headache is a very strong throbbing pain on one side or both sides of your head. Migraines can also cause other symptoms. Talk with your doctor about what things may bring on (trigger) your migraine headaches. Follow these instructions at home: Medicines   Take over-the-counter and prescription medicines only as told by your doctor.  Do not drive or use heavy machinery while taking prescription pain medicine.  To prevent or treat constipation while you are taking prescription pain medicine, your doctor may recommend that you:  Drink enough fluid to keep your pee (urine) clear or pale yellow.  Take over-the-counter or prescription medicines.  Eat foods that are high in fiber. These include fresh fruits and vegetables, whole grains, and beans.  Limit foods that are high in fat and processed sugars. These include fried and sweet foods. Lifestyle   Avoid alcohol.  Do not use any products that contain nicotine or tobacco, such as cigarettes and e-cigarettes. If you need help quitting, ask your doctor.  Get at least 8 hours of sleep every night.  Limit your stress. General instructions    Keep a journal to find out what may bring on your migraines. For example, write down:  What you eat and drink.  How much sleep you get.  Any change in what you eat or drink.  Any change in your medicines.  If you have a migraine:  Avoid things that make your symptoms worse, such as bright lights.  It may help to lie down in a dark, quiet room.  Do not drive or use heavy machinery.  Ask your doctor what activities are safe for you.  Keep all follow-up visits as told by your doctor. This is important. Contact a doctor if:  You get a migraine that is different or worse than your usual migraines. Get help right away if:  Your migraine gets very bad.  You have a fever.  You have a stiff  neck.  You have trouble seeing.  Your muscles feel weak or like you cannot control them.  You start to lose your balance a lot.  You start to have trouble walking.  You pass out (faint). This information is not intended to replace advice given to you by your health care provider. Make sure you discuss any questions you have with your health care provider. Document Released: 11/10/2007 Document Revised: 08/21/2015 Document Reviewed: 07/20/2015 Elsevier Interactive Patient Education  2017 [employer]

## 2016-05-12 NOTE — Telephone Encounter (Signed)
Closing. Called pt left VM mssg to make appt. Also send reply in another MyChart mssg

## 2016-05-16 ENCOUNTER — Encounter: Payer: Self-pay | Admitting: Family Medicine

## 2016-05-16 MED ORDER — SUMATRIPTAN SUCCINATE 100 MG PO TABS: ORAL_TABLET | ORAL | 0 refills | Status: DC

## 2016-05-19 ENCOUNTER — Other Ambulatory Visit: Payer: Self-pay | Admitting: Pediatrics

## 2016-05-19 ENCOUNTER — Other Ambulatory Visit: Payer: Self-pay | Admitting: Family Medicine

## 2016-06-14 ENCOUNTER — Other Ambulatory Visit: Payer: Self-pay | Admitting: Family Medicine

## 2016-06-14 ENCOUNTER — Telehealth: Payer: Self-pay

## 2016-06-14 ENCOUNTER — Ambulatory Visit: Payer: Medicaid Other | Admitting: Family Medicine

## 2016-06-14 MED ORDER — SUMATRIPTAN SUCCINATE 100 MG PO TABS: ORAL_TABLET | ORAL | 3 refills | Status: DC

## 2016-06-14 NOTE — Telephone Encounter (Signed)
Rx sent. Patient aware.  

## 2016-06-14 NOTE — Telephone Encounter (Signed)
Patient called back to re schedule apt with Ermalinda Memos. Apt was for a med check for her Imitrex- pt states that the medication is working but she only has one pill left. Wanting to know if he medication can just be refilled since it is working? Please advise

## 2016-06-14 NOTE — Addendum Note (Signed)
Addended by: Lorelee Cover C on: 06/14/2016 12:48 PM   Modules accepted: Orders

## 2016-06-14 NOTE — Telephone Encounter (Signed)
Yes, you make refill 3 times

## 2016-07-04 ENCOUNTER — Ambulatory Visit (INDEPENDENT_AMBULATORY_CARE_PROVIDER_SITE_OTHER): Payer: Medicaid Other | Admitting: Family

## 2016-07-04 ENCOUNTER — Encounter: Payer: Self-pay | Admitting: Family

## 2016-07-04 VITALS — BP 155/100 | HR 99 | Temp 97.9°F | Ht 66.5 in | Wt 394.8 lb

## 2016-07-04 DIAGNOSIS — R300 Dysuria: Secondary | ICD-10-CM

## 2016-07-04 DIAGNOSIS — Z905 Acquired absence of kidney: Secondary | ICD-10-CM | POA: Diagnosis not present

## 2016-07-04 DIAGNOSIS — N3001 Acute cystitis with hematuria: Secondary | ICD-10-CM | POA: Diagnosis not present

## 2016-07-04 DIAGNOSIS — R109 Unspecified abdominal pain: Secondary | ICD-10-CM

## 2016-07-04 LAB — URINALYSIS, COMPLETE
BILIRUBIN UA: NEGATIVE
GLUCOSE, UA: NEGATIVE
KETONES UA: NEGATIVE
NITRITE UA: NEGATIVE
Protein, UA: NEGATIVE
SPEC GRAV UA: 1.02 (ref 1.005–1.030)
UUROB: 0.2 mg/dL (ref 0.2–1.0)
pH, UA: 6.5 (ref 5.0–7.5)

## 2016-07-04 LAB — MICROSCOPIC EXAMINATION
Epithelial Cells (non renal): >10 /hpf — AB (ref 0–10)
Renal Epithel, UA: NONE SEEN /hpf

## 2016-07-04 MED ORDER — TRAMADOL HCL 50 MG PO TABS: 50.0000 mg | ORAL_TABLET | Freq: Three times a day (TID) | ORAL | 0 refills | Status: DC | PRN

## 2016-07-04 MED ORDER — NITROFURANTOIN MONOHYD MACRO 100 MG PO CAPS: 100.0000 mg | ORAL_CAPSULE | Freq: Two times a day (BID) | ORAL | 0 refills | Status: DC

## 2016-07-04 NOTE — Patient Instructions (Signed)
Urinary Frequency, Adult Urinary frequency means urinating more often than usual. People with urinary frequency urinate at least 8 times in 24 hours, even if they drink a normal amount of fluid. Although they urinate more often than normal, the total amount of urine produced in a day may be normal. Urinary frequency is also called pollakiuria. What are the causes? This condition may be caused by:  A urinary tract infection.  Obesity.  Bladder problems, such as bladder stones.  Caffeine or alcohol.  Eating food or drinking fluids that irritate the bladder. These include coffee, tea, soda, artificial sweeteners, citrus, tomato-based foods, and chocolate.  Certain medicines, such as medicines that help the body get rid of extra fluid (diuretics).  Muscle or nerve weakness.  Overactive bladder.  Chronic diabetes.  Interstitial cystitis.  In men, problems with the prostate, such as an enlarged prostate.  In women, pregnancy. In some cases, the cause may not be known. What increases the risk? This condition is more likely to develop in:  Women who have gone through menopause.  Men with prostate problems.  People with a disease or injury that affects the nerves or spinal cord.  People who have or have had a condition that affects the brain, such as a stroke. What are the signs or symptoms? Symptoms of this condition include:  Feeling an urgent need to urinate often. The stress and anxiety of needing to find a bathroom quickly can make this urge worse.  Urinating 8 or more times in 24 hours.  Urinating as often as every 1 to 2 hours. How is this diagnosed? This condition is diagnosed based on your symptoms, your medical history, and a physical exam. You may have tests, such as:  Blood tests.  Urine tests.  Imaging tests, such as X-rays or ultrasounds.  A bladder test.  A test of your neurological system. This is the body system that senses the need to urinate.  A  test to check for problems in the urethra and bladder called cystoscopy. You may also be asked to keep a bladder diary. A bladder diary is a record of what you eat and drink, how often you urinate, and how much you urinate. You may need to see a health care provider who specializes in conditions of the urinary tract (urologist) or kidneys (nephrologist). How is this treated? Treatment for this condition depends on the cause. Sometimes the condition goes away on its own and treatment is not necessary. If treatment is needed, it may include:  Taking medicine.  Learning exercises that strengthen the muscles that help control urination.  Following a bladder training program. This may include:  Learning to delay going to the bathroom.  Double urinating (voiding). This helps if you are not completely emptying your bladder.  Scheduled voiding.  Making diet changes, such as:  Avoiding caffeine.  Drinking fewer fluids, especially alcohol.  Not drinking in the evening.  Not having foods or drinks that may irritate the bladder.  Eating foods that help prevent or ease constipation. Constipation can make this condition worse.  Having the nerves in your bladder stimulated. There are two options for stimulating the nerves to your bladder:  Outpatient electrical nerve stimulation. This is done by your health care provider.  Surgery to implant a bladder pacemaker. The pacemaker helps to control the urge to urinate. Follow these instructions at home:  Keep a bladder diary if told to by your health care provider.  Take over-the-counter and prescription medicines only as   told by your health care provider.  Do any exercises as told by your health care provider.  Follow a bladder training program as told by your health care provider.  Make any recommended diet changes.  Keep all follow-up visits as told by your health care provider. This is important. Contact a health care provider  if:  You start urinating more often.  You feel pain or irritation when you urinate.  You notice blood in your urine.  Your urine looks cloudy.  You develop a fever.  You begin vomiting. Get help right away if:  You are unable to urinate. This information is not intended to replace advice given to you by your health care provider. Make sure you discuss any questions you have with your health care provider. Document Released: 11/27/2008 Document Revised: 03/04/2015 Document Reviewed: 08/27/2014 Elsevier Interactive Patient Education  2017 Elsevier Inc.  

## 2016-07-04 NOTE — Progress Notes (Addendum)
   Subjective:    Patient ID: Alice [name], female    DOB: 1985-06-12, 31 y.o.   MRN: [id number]  Abdominal Pain  This is a new problem. The current episode started in the past 7 days. The onset quality is gradual. The problem occurs constantly. The problem has been gradually worsening. The pain is located in the LUQ and left flank. The pain is at a severity of 7/10. The pain is moderate. The quality of the pain is burning. Associated symptoms include dysuria, hematuria, nausea and vomiting. Pertinent negatives include no frequency. Treatments tried: force fluids. The treatment provided no relief.      Review of Systems  Gastrointestinal: Positive for abdominal pain, nausea and vomiting.  Genitourinary: Positive for dysuria and hematuria. Negative for frequency.  All other systems reviewed and are negative.      Objective:   Physical Exam  Constitutional: She is oriented to person, place, and time. She appears well-developed and well-nourished. No distress.  Morbid obese   HENT:  Head: Normocephalic.  Eyes: Pupils are equal, round, and reactive to light.  Neck: Normal range of motion. Neck supple. No thyromegaly present.  Cardiovascular: Normal rate, regular rhythm, normal heart sounds and intact distal pulses.   No murmur heard. Pulmonary/Chest: Effort normal and breath sounds normal.  Abdominal: Soft. Bowel sounds are normal. She exhibits no distension. There is no tenderness.  Musculoskeletal: Normal range of motion. She exhibits no edema or tenderness.  Left CVA tenderness   Neurological: She is alert and oriented to person, place, and time.  Skin: Skin is warm and dry.  Psychiatric: She has a normal mood and affect. Her behavior is normal. Judgment and thought content normal.  Vitals reviewed.    BP (!) 155/100   Pulse 99   Temp 97.9 F (36.6 C) (Oral)   Ht 5' 6.5" (1.689 m)   Wt (!) 394 lb 12.8 oz (179.1 kg)   BMI 62.77 kg/m      Assessment & Plan:  1.  Dysuria - Urinalysis, Complete  2. Acute cystitis with hematuria Force fluids AZO over the counter X2 days RTO prn Culture pending - nitrofurantoin, macrocrystal-monohydrate, (MACROBID) 100 MG capsule; Take 1 capsule (100 mg total) by mouth 2 (two) times daily.  Dispense: 10 capsule; Refill: 0 - Urine culture   3. Left flank pain - CBC with Differential/Platelet - traMADol (ULTRAM) 50 MG tablet; Take 1 tablet (50 mg total) by mouth every 8 (eight) hours as needed.  Dispense: 60 tablet; Refill: 0   4. History of unilateral nephrectomy   UTI vs Kidney stone, pt has hx of stones Force fluids If pain continues or does not improve RTO  Pt told to force fluids but if having any obstruction symptoms go to ED!!!  Jannifer Rodneyhristy Hawks, FNP

## 2016-07-07 LAB — URINE CULTURE

## 2016-07-14 ENCOUNTER — Telehealth: Payer: Self-pay | Admitting: Family Medicine

## 2016-07-14 ENCOUNTER — Telehealth: Payer: Self-pay

## 2016-07-14 MED ORDER — RIZATRIPTAN BENZOATE 10 MG PO TABS: 5.0000 mg | ORAL_TABLET | ORAL | 4 refills | Status: DC | PRN

## 2016-07-14 NOTE — Telephone Encounter (Signed)
Rizatriptan sent  Murtis SinkSam Bradshaw, MD [facility] 07/14/2016, 12:14 PM

## 2016-07-14 NOTE — Telephone Encounter (Signed)
Pt aware but is stating medicaid doesn't won't to cover the rizatriptan either.

## 2016-07-15 ENCOUNTER — Encounter: Payer: Self-pay | Admitting: Family

## 2016-08-09 ENCOUNTER — Other Ambulatory Visit: Payer: Self-pay | Admitting: Family Medicine

## 2016-08-12 ENCOUNTER — Other Ambulatory Visit: Payer: Self-pay | Admitting: Family

## 2016-08-12 DIAGNOSIS — R109 Unspecified abdominal pain: Secondary | ICD-10-CM

## 2016-09-05 ENCOUNTER — Other Ambulatory Visit: Payer: Self-pay | Admitting: Family Medicine

## 2016-10-01 ENCOUNTER — Emergency Department (HOSPITAL_COMMUNITY): Payer: Medicaid Other

## 2016-10-01 ENCOUNTER — Emergency Department (HOSPITAL_COMMUNITY)
Admission: EM | Admit: 2016-10-01 | Discharge: 2016-10-01 | Disposition: A | Discharge status: Home / Self Care | Payer: Medicaid Other | Attending: Emergency Medicine | Admitting: Emergency Medicine

## 2016-10-01 ENCOUNTER — Encounter (HOSPITAL_COMMUNITY): Payer: Self-pay | Admitting: *Deleted

## 2016-10-01 DIAGNOSIS — Z79899 Other long term (current) drug therapy: Secondary | ICD-10-CM | POA: Insufficient documentation

## 2016-10-01 DIAGNOSIS — F17210 Nicotine dependence, cigarettes, uncomplicated: Secondary | ICD-10-CM | POA: Diagnosis not present

## 2016-10-01 DIAGNOSIS — M25562 Pain in left knee: Secondary | ICD-10-CM | POA: Diagnosis present

## 2016-10-01 DIAGNOSIS — I10 Essential (primary) hypertension: Secondary | ICD-10-CM | POA: Insufficient documentation

## 2016-10-01 MED ORDER — OXYCODONE HCL 5 MG PO TABS: 5.0000 mg | ORAL_TABLET | ORAL | 0 refills | Status: DC | PRN

## 2016-10-01 NOTE — ED Triage Notes (Signed)
Pt brought in by [facility] with c/o left knee pain after stepping out of her [employer] Niece today and heard a pop sound and then fell to the ground. Denies LOC upon fall.

## 2016-10-01 NOTE — Discharge Instructions (Addendum)
You may take the oxycodone prescribed for pain relief.  This will make you drowsy - do not drive within 4 hours of taking this medication.  Ice and elevate your knee as much as possible for the next several days.  Use your home knee immobilizer and crutches as discussed.

## 2016-10-03 NOTE — ED Provider Notes (Signed)
[facility]PT Provider Note   CSN: [id number] Arrival date & time: 10/01/16  1340     History   Chief Complaint Chief Complaint  Patient presents with  . Knee Pain    HPI Alice [name] is a 31 y.o. female presenting with sudden onset of left knee pain.  She was stepping out of her [employer] when she felt an heard a popping sensation in the knee followed by severe pain that caused her to fall.  She denies any other injury with the fall, but endorses extreme valgus strain during the injury. She has a history of prior ACL repair of this knee at [facility], under the care of Dr. Williams Che. She has found no alleviators for this pain.  There is no radiation of pain and she has had no treatment prior to arrival. Denies any additional injury from falling.  The history is provided by the patient.    Past Medical History:  Diagnosis Date  . Chronic left flank pain   . Chronic pain   . Congenital obstruction of ureteropelvic junction (UPJ)   . 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 GERD (gastroesophageal reflux disease)   . History of kidney stones   . History of recurrent UTIs   . Hypertension   . Kidney disease   . Loin pain hematuria syndrome    Dr. [name] at [facility]  . Morbid obesity [facility])   . Preterm labor   . Round ligament pain 09/10/2012  . Torn ACL     Patient Active Problem List   Diagnosis Date Noted  . Mood disorder (HCC) 12/30/2014  . Smoker 05/13/2014  . Post-operative state 02/22/2013  . Elevated liver function tests 09/24/2012  . 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 History of recurrent UTI (urinary tract infection) 07/23/2012  . Obesity, morbid, BMI 50 or higher (HCC) 07/23/2012  . H/O preterm delivery, currently pregnant 07/23/2012    Past Surgical History:  Procedure Laterality Date  . addenoidectomy    . CESAREAN SECTION N/A 02/22/2013   Procedure: CESAREAN SECTION/TWINS;  Surgeon: [name], MD;  Location: [facility];   Service: Obstetrics;  Laterality: N/A;  . NASAL SINUS SURGERY    . NEPHRECTOMY     right side  . OTHER SURGICAL HISTORY     ulner nerve removal  . TONSILLECTOMY      OB History    Gravida Para Term Preterm AB Living   2 2 1 1   3    SAB TAB Ectopic Multiple Live Births         1 3       Home Medications    Prior to Admission medications   Medication Sig Start Date End Date Taking? Authorizing Provider  gabapentin (NEURONTIN) 300 MG capsule Take 300 mg by mouth. 06/20/16 06/20/17  [provider]  nitrofurantoin, macrocrystal-monohydrate, (MACROBID) 100 MG capsule Take 1 capsule (100 mg total) by mouth 2 (two) times daily. 07/04/16   Jannifer Rodney A, FNP  omeprazole (PRILOSEC) 40 MG capsule TAKE 1 CAPSULE (40 MG TOTAL) BY MOUTH DAILY. 09/07/16   [name], MD  oxyCODONE (ROXICODONE) 5 MG immediate release tablet Take 1 tablet (5 mg total) by mouth every 4 (four) hours as needed for severe pain. 10/01/16   Burgess Amor, PA-C  promethazine (PHENERGAN) 25 MG tablet TAKE 1 TABLET (25 MG TOTAL) BY MOUTH EVERY 6 (SIX) HOURS AS NEEDED FOR NAUSEA. 08/24/15   [provider]  rizatriptan (MAXALT) 10 MG tablet Take 0.5-1 tablets (5-10 mg total) by mouth as needed for migraine. May repeat in 2 hours if needed 07/14/16   [name], MD  SUMAtriptan (IMITREX) 100 MG tablet Take 1/2-1 pill for headache, May repeat in 2 hours if headache persists or recurs. Do not take more than 2 pills in one day 06/14/16   Remus Loffler, PA-C  traMADol (ULTRAM) 50 MG tablet Take 1 tablet (50 mg total) by mouth every 8 (eight) hours as needed. 07/04/16   Jannifer Rodney A, FNP  venlafaxine (EFFEXOR) 75 MG tablet TAKE 1 TABLET (75 MG TOTAL) BY MOUTH 2 (TWO) TIMES DAILY. 08/10/16   Junie Spencer, FNP    Family History Family History  Problem Relation Age of Onset  . Hypertension Mother   . Hyperlipidemia Mother   . Heart disease Mother   . Heart disease Father        MI  . Alcohol  abuse Father   . Alcohol abuse Brother   . Diabetes Maternal Grandmother   . Hyperlipidemia Maternal Grandmother     Social History Social History  Substance Use Topics  . Smoking status: Current Every Day Smoker    Packs/day: 0.50    Years: 0.50    Types: Cigarettes    Last attempt to quit: 06/21/2012  . Smokeless tobacco: Never Used  . Alcohol use No     Allergies   Reglan [metoclopramide]; Cinnamon flavor; Codeine; Flomax [tamsulosin]; Ibuprofen; Levofloxacin; and Tylenol [acetaminophen]   Review of Systems Review of Systems  Constitutional: Negative for fever.  Musculoskeletal: Positive for arthralgias and joint swelling. Negative for myalgias.  Neurological: Negative for weakness and numbness.     Physical Exam Updated Vital Signs BP (!) 152/104 (BP Location: Right Wrist)   Pulse 100   Temp 98.5 F (36.9 C) (Oral)   Resp 18   Ht 5\' 9"  (1.753 m)   Wt (!) 163.3 kg (360 lb)   LMP  (Within Weeks)   SpO2 100%   BMI 53.16 kg/m   Physical Exam  Constitutional: She appears well-developed and well-nourished.  HENT:  Head: Atraumatic.  Neck: Normal range of motion.  Cardiovascular:  Pulses:      Dorsalis pedis pulses are 2+ on the right side, and 2+ on the left side.  Pulses equal bilaterally  Musculoskeletal: She exhibits tenderness.       Left knee: She exhibits decreased range of motion and swelling. She exhibits no ecchymosis, no deformity, no erythema, normal alignment, no LCL laxity and no MCL laxity. Tenderness found. Medial joint line and lateral joint line tenderness noted.  Neurological: She is alert. She has normal strength. She displays normal reflexes. No sensory deficit.  Skin: Skin is warm and dry.  Psychiatric: She has a normal mood and affect.     ED Treatments / Results  Labs (all labs ordered are listed, but only abnormal results are displayed) Labs Reviewed - No data to display  EKG  EKG Interpretation None       Radiology   Dg  Knee Complete 4 Views Left  Result Date: 10/01/2016 CLINICAL DATA:  Pain following fall EXAM: LEFT KNEE - COMPLETE 4+ VIEW COMPARISON:  April 14, 2014 FINDINGS: Frontal, lateral, and bilateral oblique views were obtained. Postoperative change is noted along the lateral distal femur and medial proximal tibia. No acute fracture or dislocation. No joint effusion. There is narrowing of the patellofemoral joint. Other joint spaces appear normal. There is  mild spurring in all compartments. IMPRESSION: Previous cruciate ligament repair. Narrowing patellofemoral joint. No fracture or joint effusion evident. Electronically Signed   By: Bretta Bang III M.D.   On: 10/01/2016 14:26     Procedures Procedures (including critical care time)  Medications Ordered in ED Medications - No data to display   Initial Impression / Assessment and Plan / ED Course  I have reviewed the triage vital signs and the nursing notes.  Pertinent labs & imaging results that were available during my care of the patient were reviewed by me and considered in my medical decision making (see chart for details).     Pt with left knee pain concerning for possible ligament injury.  She has crutches and knee immobilizer from her prior surgery which she will use as they are sized for her - ice, elevation recommended.  Plan f/u with her orthopedist for a recheck of her symptoms this asap.    The patient appears reasonably screened and/or stabilized for discharge and I doubt any other medical condition or other [facility] requiring further screening, evaluation, or treatment in the ED at this time prior to discharge.   Final Clinical Impressions(s) / ED Diagnoses   Final diagnoses:  Acute pain of left knee    New Prescriptions Discharge Medication List as of 10/01/2016  3:06 PM    START taking these medications   Details  oxyCODONE (ROXICODONE) 5 MG immediate release tablet Take 1 tablet (5 mg total) by mouth every 4 (four)  hours as needed for severe pain., Starting Sat 10/01/2016, Print         Victoriano Lain 10/03/16 2137    Donnetta Hutching, MD 10/05/16 [phone]

## 2016-10-23 ENCOUNTER — Other Ambulatory Visit: Payer: Self-pay | Admitting: Physician Assistant

## 2016-11-05 ENCOUNTER — Encounter: Payer: Self-pay | Admitting: Family Medicine

## 2016-11-08 ENCOUNTER — Other Ambulatory Visit: Payer: Self-pay | Admitting: Family

## 2016-11-09 NOTE — Telephone Encounter (Signed)
Last seen 07/04/16  Dr Ermalinda Memos

## 2017-01-13 ENCOUNTER — Other Ambulatory Visit: Payer: Self-pay | Admitting: Family Medicine

## 2017-01-26 ENCOUNTER — Other Ambulatory Visit: Payer: Self-pay | Admitting: Family Medicine

## 2017-01-27 NOTE — Telephone Encounter (Signed)
Last seen 07/04/16

## 2017-02-11 ENCOUNTER — Other Ambulatory Visit: Payer: Self-pay | Admitting: Family Medicine

## 2017-02-14 HISTORY — PX: GALLBLADDER SURGERY: SHX652

## 2017-03-15 ENCOUNTER — Other Ambulatory Visit: Payer: Self-pay | Admitting: Family Medicine

## 2017-03-16 NOTE — Telephone Encounter (Signed)
Last seen 5.21.18  Dr Ermalinda MemosBradshaw

## 2017-03-17 ENCOUNTER — Ambulatory Visit: Payer: Medicaid Other | Admitting: Family Medicine

## 2017-03-17 ENCOUNTER — Encounter: Payer: Self-pay | Admitting: Family Medicine

## 2017-03-17 VITALS — BP 162/97 | HR 90 | Temp 97.1°F | Ht 69.0 in | Wt 394.8 lb

## 2017-03-17 DIAGNOSIS — R509 Fever, unspecified: Secondary | ICD-10-CM

## 2017-03-17 LAB — VERITOR FLU A/B WAIVED
INFLUENZA B: NEGATIVE
Influenza A: NEGATIVE

## 2017-03-17 MED ORDER — HYDROCODONE-HOMATROPINE 5-1.5 MG/5ML PO SYRP: 5.0000 mL | ORAL_SOLUTION | Freq: Four times a day (QID) | ORAL | 0 refills | Status: DC | PRN

## 2017-03-17 MED ORDER — ONDANSETRON 4 MG PO TBDP: 4.0000 mg | ORAL_TABLET | Freq: Three times a day (TID) | ORAL | 0 refills | Status: DC | PRN

## 2017-03-17 MED ORDER — AZITHROMYCIN 250 MG PO TABS: ORAL_TABLET | ORAL | 0 refills | Status: DC

## 2017-03-17 MED ORDER — OSELTAMIVIR PHOSPHATE 75 MG PO CAPS: 75.0000 mg | ORAL_CAPSULE | Freq: Two times a day (BID) | ORAL | 0 refills | Status: DC

## 2017-03-17 NOTE — Progress Notes (Signed)
   HPI  Patient presents today here with acute illness.  Patient's had symptoms for about 4 days including body aches, chills, diarrhea, emesis.  She states that she has had emesis times about 7-8 times per day. Nonbloody and nonbilious.  Patient also has mild shortness of breath.  Patient is taking Tylenol, ibuprofen, naproxen at times. She states that helps temporarily but then wears off.  PMH: Smoking status noted ROS: Per HPI  Objective: BP (!) 162/97   Pulse 90   Temp (!) 97.1 F (36.2 C) (Oral)   Ht 5\' 9"  (1.753 m)   Wt (!) 394 lb 12.8 oz (179.1 kg)   BMI 58.30 kg/m  Gen: NAD, alert, cooperative with exam HEENT: NCAT, tears present with vomiting CV: RRR, good S1/S2, no murmur Resp: CTABL, no wheezes, non-labored Ext: No edema, warm Neuro: Alert and oriented, No gross deficits  Assessment and plan:  #Fever Patient with multiple symptoms with a flulike illness. Given severity of illness I have given her azithromycin with a negative flu test. Of also cover her with Tamiflu, Zofran, and Hycodan cough syrup. Return to clinic with any concerns   Orders Placed This Encounter  Procedures  . Veritor Flu A/B Waived    Order Specific Question:   Source    Answer:   nasal    Meds ordered this encounter  Medications  . HYDROcodone-homatropine (HYCODAN) 5-1.5 MG/5ML syrup    Sig: Take 5 mLs by mouth every 6 (six) hours as needed for cough.    Dispense:  120 mL    Refill:  0  . ondansetron (ZOFRAN ODT) 4 MG disintegrating tablet    Sig: Take 1 tablet (4 mg total) by mouth every 8 (eight) hours as needed for nausea or vomiting.    Dispense:  20 tablet    Refill:  0  . oseltamivir (TAMIFLU) 75 MG capsule    Sig: Take 1 capsule (75 mg total) by mouth 2 (two) times daily.    Dispense:  10 capsule    Refill:  0  . azithromycin (ZITHROMAX) 250 MG tablet    Sig: Take 2 tablets on day 1 and 1 tablet daily after that    Dispense:  6 tablet    Refill:  0    Murtis SinkSam  Abigaile Rossie, MD [facility] 03/17/2017, 9:43 AM

## 2017-03-17 NOTE — Patient Instructions (Signed)
Great to see you!   Influenza, Adult Influenza, more commonly known as "the flu," is a viral infection that primarily affects the respiratory tract. The respiratory tract includes organs that help you breathe, such as the lungs, nose, and throat. The flu causes many common cold symptoms, as well as a high fever and body aches. The flu spreads easily from person to person (is contagious). Getting a flu shot (influenza vaccination) every year is the best way to prevent influenza. What are the causes? Influenza is caused by a virus. You can catch the virus by:  Breathing in droplets from an infected person's cough or sneeze.  Touching something that was recently contaminated with the virus and then touching your mouth, nose, or eyes. What increases the risk? The following factors may make you more likely to get the flu:  Not cleaning your hands frequently with soap and water or alcohol-based hand sanitizer.  Having close contact with many people during cold and flu season.  Touching your mouth, eyes, or nose without washing or sanitizing your hands first.  Not drinking enough fluids or not eating a healthy diet.  Not getting enough sleep or exercise.  Being under a high amount of stress.  Not getting a yearly (annual) flu shot. You may be at a higher risk of complications from the flu, such as a severe lung infection (pneumonia), if you:  Are over the age of 65.  Are pregnant.  Have a weakened disease-fighting system (immune system). You may have a weakened immune system if you:  Have HIV or AIDS.  Are undergoing chemotherapy.  Aretaking medicines that reduce the activity of (suppress) the immune system.  Have a long-term (chronic) illness, such as heart disease, kidney disease, diabetes, or lung disease.  Have a liver disorder.  Are obese.  Have anemia. What are the signs or symptoms? Symptoms of this condition typically last 4-10 days and may  include:  Fever.  Chills.  Headache, body aches, or muscle aches.  Sore throat.  Cough.  Runny or congested nose.  Chest discomfort and cough.  Poor appetite.  Weakness or tiredness (fatigue).  Dizziness.  Nausea or vomiting. How is this diagnosed? This condition may be diagnosed based on your medical history and a physical exam. Your health care provider may do a nose or throat swab test to confirm the diagnosis. How is this treated? If influenza is detected early, you can be treated with antiviral medicine that can reduce the length of your illness and the severity of your symptoms. This medicine may be given by mouth (orally) or through an IV tube that is inserted in one of your veins. The goal of treatment is to relieve symptoms by taking care of yourself at home. This may include taking over-the-counter medicines, drinking plenty of fluids, and adding humidity to the air in your home. In some cases, influenza goes away on its own. Severe influenza or complications from influenza may be treated in a hospital. Follow these instructions at home:  Take over-the-counter and prescription medicines only as told by your health care provider.  Use a cool mist humidifier to add humidity to the air in your home. This can make breathing easier.  Rest as needed.  Drink enough fluid to keep your urine clear or pale yellow.  Cover your mouth and nose when you cough or sneeze.  Wash your hands with soap and water often, especially after you cough or sneeze. If soap and water are not available, use   hand sanitizer.  Stay home from work or school as told by your health care provider. Unless you are visiting your health care provider, try to avoid leaving home until your fever has been gone for 24 hours without the use of medicine.  Keep all follow-up visits as told by your health care provider. This is important. How is this prevented?  Getting an annual flu shot is the best way to  avoid getting the flu. You may get the flu shot in late summer, fall, or winter. Ask your health care provider when you should get your flu shot.  Wash your hands often or use hand sanitizer often.  Avoid contact with people who are sick during cold and flu season.  Eat a healthy diet, drink plenty of fluids, get enough sleep, and exercise regularly. Contact a health care provider if:  You develop new symptoms.  You have:  Chest pain.  Diarrhea.  A fever.  Your cough gets worse.  You produce more mucus.  You feel nauseous or you vomit. Get help right away if:  You develop shortness of breath or difficulty breathing.  Your skin or nails turn a bluish color.  You have severe pain or stiffness in your neck.  You develop a sudden headache or sudden pain in your face or ear.  You cannot stop vomiting. This information is not intended to replace advice given to you by your health care provider. Make sure you discuss any questions you have with your health care provider. Document Released: 01/29/2000 Document Revised: 07/09/2015 Document Reviewed: 11/25/2014 Elsevier Interactive Patient Education  2017 Elsevier Inc.  

## 2017-03-30 ENCOUNTER — Emergency Department (HOSPITAL_COMMUNITY)
Admission: EM | Admit: 2017-03-30 | Discharge: 2017-03-30 | Disposition: A | Discharge status: Home / Self Care | Payer: No Typology Code available for payment source | Attending: Emergency Medicine | Admitting: Emergency Medicine

## 2017-03-30 ENCOUNTER — Emergency Department (HOSPITAL_COMMUNITY): Payer: No Typology Code available for payment source

## 2017-03-30 ENCOUNTER — Encounter (HOSPITAL_COMMUNITY): Payer: Self-pay | Admitting: Emergency Medicine

## 2017-03-30 DIAGNOSIS — S199XXA Unspecified injury of neck, initial encounter: Secondary | ICD-10-CM | POA: Diagnosis present

## 2017-03-30 DIAGNOSIS — M25522 Pain in left elbow: Secondary | ICD-10-CM | POA: Diagnosis not present

## 2017-03-30 DIAGNOSIS — M25562 Pain in left knee: Secondary | ICD-10-CM | POA: Diagnosis not present

## 2017-03-30 DIAGNOSIS — F17210 Nicotine dependence, cigarettes, uncomplicated: Secondary | ICD-10-CM | POA: Diagnosis not present

## 2017-03-30 DIAGNOSIS — R51 Headache: Secondary | ICD-10-CM | POA: Insufficient documentation

## 2017-03-30 DIAGNOSIS — I10 Essential (primary) hypertension: Secondary | ICD-10-CM | POA: Insufficient documentation

## 2017-03-30 DIAGNOSIS — R0789 Other chest pain: Secondary | ICD-10-CM | POA: Diagnosis not present

## 2017-03-30 DIAGNOSIS — Y9389 Activity, other specified: Secondary | ICD-10-CM | POA: Insufficient documentation

## 2017-03-30 DIAGNOSIS — Z79899 Other long term (current) drug therapy: Secondary | ICD-10-CM | POA: Insufficient documentation

## 2017-03-30 DIAGNOSIS — Y92410 Unspecified street and highway as the place of occurrence of the external cause: Secondary | ICD-10-CM | POA: Insufficient documentation

## 2017-03-30 DIAGNOSIS — Y999 Unspecified external cause status: Secondary | ICD-10-CM | POA: Insufficient documentation

## 2017-03-30 DIAGNOSIS — R519 Headache, unspecified: Secondary | ICD-10-CM

## 2017-03-30 DIAGNOSIS — M542 Cervicalgia: Secondary | ICD-10-CM | POA: Diagnosis not present

## 2017-03-30 LAB — I-STAT CHEM 8, ED
BUN: 17 mg/dL (ref 6–20)
Calcium, Ion: 1.09 mmol/L — ABNORMAL LOW (ref 1.15–1.40)
Chloride: 106 mmol/L (ref 101–111)
Creatinine, Ser: 1 mg/dL (ref 0.44–1.00)
Glucose, Bld: 90 mg/dL (ref 65–99)
HCT: 44 % (ref 36.0–46.0)
Hemoglobin: 15 g/dL (ref 12.0–15.0)
Potassium: 4 mmol/L (ref 3.5–5.1)
Sodium: 139 mmol/L (ref 135–145)
TCO2: 23 mmol/L (ref 22–32)

## 2017-03-30 MED ORDER — IOPAMIDOL (ISOVUE-300) INJECTION 61%
100.0000 mL | Freq: Once | INTRAVENOUS | Status: AC | PRN
  Administered 2017-03-30: 100 mL via INTRAVENOUS

## 2017-03-30 MED ORDER — CYCLOBENZAPRINE HCL 10 MG PO TABS: 10.0000 mg | ORAL_TABLET | Freq: Two times a day (BID) | ORAL | 0 refills | Status: DC | PRN

## 2017-03-30 MED ORDER — MORPHINE SULFATE (PF) 4 MG/ML IV SOLN
4.0000 mg | Freq: Once | INTRAVENOUS | Status: AC
  Administered 2017-03-30: 4 mg via INTRAVENOUS
  Filled 2017-03-30: qty 1

## 2017-03-30 NOTE — ED Notes (Signed)
Speaking with [place] [employer]

## 2017-03-30 NOTE — Discharge Instructions (Signed)
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
## 2017-03-30 NOTE — ED Notes (Signed)
Currently in x-ray.

## 2017-04-05 DIAGNOSIS — G5622 Lesion of ulnar nerve, left upper limb: Secondary | ICD-10-CM | POA: Insufficient documentation

## 2017-04-07 ENCOUNTER — Ambulatory Visit (INDEPENDENT_AMBULATORY_CARE_PROVIDER_SITE_OTHER): Payer: Medicaid Other

## 2017-04-07 ENCOUNTER — Other Ambulatory Visit: Payer: Self-pay | Admitting: Family Medicine

## 2017-04-07 ENCOUNTER — Encounter: Payer: Self-pay | Admitting: Family Medicine

## 2017-04-07 ENCOUNTER — Ambulatory Visit: Payer: Medicaid Other | Admitting: Family Medicine

## 2017-04-07 DIAGNOSIS — R102 Pelvic and perineal pain: Secondary | ICD-10-CM | POA: Diagnosis not present

## 2017-04-07 LAB — URINALYSIS, COMPLETE
Bilirubin, UA: NEGATIVE
GLUCOSE, UA: NEGATIVE
Ketones, UA: NEGATIVE
NITRITE UA: NEGATIVE
PH UA: 6 (ref 5.0–7.5)
Protein, UA: NEGATIVE
RBC, UA: NEGATIVE
Specific Gravity, UA: >1.03 — ABNORMAL HIGH (ref 1.005–1.030)
UUROB: 0.2 mg/dL (ref 0.2–1.0)

## 2017-04-07 LAB — MICROSCOPIC EXAMINATION: RENAL EPITHEL UA: NONE SEEN /HPF

## 2017-04-07 LAB — PREGNANCY, URINE: PREG TEST UR: NEGATIVE

## 2017-04-07 MED ORDER — FLUCONAZOLE 150 MG PO TABS: ORAL_TABLET | ORAL | 0 refills | Status: DC
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
## 2017-04-07 NOTE — Patient Instructions (Signed)
Great to see you!   

## 2017-04-08 LAB — CMP14+EGFR
A/G RATIO: 1.6 (ref 1.2–2.2)
ALBUMIN: 4.2 g/dL (ref 3.5–5.5)
ALT: 33 IU/L — ABNORMAL HIGH (ref 0–32)
AST: 29 IU/L (ref 0–40)
Alkaline Phosphatase: 71 IU/L (ref 39–117)
BILIRUBIN TOTAL: 0.4 mg/dL (ref 0.0–1.2)
BUN / CREAT RATIO: 17 (ref 9–23)
BUN: 15 mg/dL (ref 6–20)
CHLORIDE: 105 mmol/L (ref 96–106)
CO2: 19 mmol/L — ABNORMAL LOW (ref 20–29)
Calcium: 9.2 mg/dL (ref 8.7–10.2)
Creatinine, Ser: 0.87 mg/dL (ref 0.57–1.00)
GFR calc non Af Amer: 88 mL/min/{1.73_m2} (ref >59)
GFR, EST AFRICAN AMERICAN: 102 mL/min/{1.73_m2} (ref >59)
GLOBULIN, TOTAL: 2.7 g/dL (ref 1.5–4.5)
Glucose: 95 mg/dL (ref 65–99)
POTASSIUM: 4.7 mmol/L (ref 3.5–5.2)
SODIUM: 140 mmol/L (ref 134–144)
Total Protein: 6.9 g/dL (ref 6.0–8.5)

## 2017-04-08 LAB — CBC WITH DIFFERENTIAL/PLATELET
Basophils Absolute: 0 10*3/uL (ref 0.0–0.2)
Basos: 0 %
EOS (ABSOLUTE): 0.8 10*3/uL — AB (ref 0.0–0.4)
Eos: 8 %
HEMOGLOBIN: 14.7 g/dL (ref 11.1–15.9)
Hematocrit: 44.2 % (ref 34.0–46.6)
Immature Grans (Abs): 0 10*3/uL (ref 0.0–0.1)
Immature Granulocytes: 0 %
LYMPHS ABS: 2.7 10*3/uL (ref 0.7–3.1)
Lymphs: 26 %
MCH: 29.3 pg (ref 26.6–33.0)
MCHC: 33.3 g/dL (ref 31.5–35.7)
MCV: 88 fL (ref 79–97)
MONOCYTES: 7 %
MONOS ABS: 0.7 10*3/uL (ref 0.1–0.9)
NEUTROS ABS: 6.1 10*3/uL (ref 1.4–7.0)
Neutrophils: 59 %
Platelets: 367 10*3/uL (ref 150–379)
RBC: 5.01 x10E6/uL (ref 3.77–5.28)
RDW: 14.3 % (ref 12.3–15.4)
WBC: 10.3 10*3/uL (ref 3.4–10.8)

## 2017-04-08 LAB — URINE CULTURE

## 2017-04-13 ENCOUNTER — Other Ambulatory Visit: Payer: Self-pay | Admitting: Family Medicine

## 2017-04-21 ENCOUNTER — Encounter: Payer: Self-pay | Admitting: Family Medicine

## 2017-05-12 ENCOUNTER — Other Ambulatory Visit: Payer: Self-pay | Admitting: Family Medicine

## 2017-06-04 ENCOUNTER — Encounter: Payer: Self-pay | Admitting: Family Medicine

## 2017-06-19 DIAGNOSIS — K8013 Calculus of gallbladder with acute and chronic cholecystitis with obstruction: Secondary | ICD-10-CM | POA: Insufficient documentation

## 2017-07-04 ENCOUNTER — Encounter: Payer: Self-pay | Admitting: Family Medicine

## 2017-07-10 ENCOUNTER — Other Ambulatory Visit: Payer: Self-pay | Admitting: Family Medicine

## 2017-07-13 ENCOUNTER — Ambulatory Visit: Payer: Medicaid Other | Admitting: Family Medicine

## 2017-07-14 ENCOUNTER — Ambulatory Visit: Payer: Medicaid Other | Admitting: Family Medicine

## 2017-07-14 ENCOUNTER — Encounter: Payer: Self-pay | Admitting: Family Medicine

## 2017-07-14 VITALS — BP 164/101 | HR 103 | Temp 97.4°F | Ht 69.0 in | Wt 375.6 lb

## 2017-07-14 DIAGNOSIS — R1032 Left lower quadrant pain: Secondary | ICD-10-CM

## 2017-07-14 DIAGNOSIS — R197 Diarrhea, unspecified: Secondary | ICD-10-CM | POA: Diagnosis not present

## 2017-07-14 NOTE — Patient Instructions (Signed)
Great to see you!   

## 2017-07-14 NOTE — Progress Notes (Signed)
   HPI  Patient presents today here for abdominal pain and diarrhea.  Patient explains that about 1 month ago she had severe abdominal pain was seen in the hospital.  She had a laparoscopic cholecystectomy performed and states that after the surgery and her pain is not improved.  Patient states that over the last week she has had intermittent fevers as high as 101.3.  Patient states she was re-seen in the hospital with no additional treatments.  She has been seen and to follow-up by general surgery and CT of the abdomen was reported to be normal.  Patient states that she has many episodes of foul-smelling diarrhea a day. This is been going on for 6 to 7 weeks.  PMH: Smoking status noted ROS: Per HPI  Objective: BP (!) 164/101   Pulse (!) 103   Temp (!) 97.4 F (36.3 C) (Oral)   Ht '5\' 9"'$  (1.753 m)   Wt (!) 375 lb 9.6 oz (170.4 kg)   BMI 55.47 kg/m  Gen: NAD, alert, cooperative with exam HEENT: NCAT CV: RRR, good S1/S2, no murmur Resp: CTABL, no wheezes, non-labored Abd: Positive bowel sounds, tenderness to palpation throughout with no guarding, no rebound pain Ext: No edema, warm Neuro: Alert and oriented, No gross deficits  Assessment and plan:  #Diarrhea, left lower quadrant abdominal pain Patient with persistent symptoms after surgery Refer to GI per surgery's recommendations. Labs today including C. difficile She has a reportedly normal CT from general surgery about 2 weeks ago, request that report today.  discussed reasons to seek emergency medical care.  Orders Placed This Encounter  Procedures  . Clostridium Difficile by PCR    Order Specific Question:   Is your patient experiencing loose or watery stools (3 or more in 24 hours)?    Answer:   Yes    Order Specific Question:   Has the patient received laxatives in the last 24 hours?    Answer:   No    Order Specific Question:   Has a negative Cdiff test resulted in the last 7 days?    Answer:   No  .  Culture, blood (single)  . Culture, blood (single)  . CMP14+EGFR  . CBC with Differential/Platelet  . Lipase  . Ambulatory referral to Gastroenterology    Referral Priority:   Routine    Referral Type:   Consultation    Referral Reason:   Specialty Services Required    Number of Visits Requested:   [place], MD [place] 07/14/2017, 9:32 AM

## 2017-07-15 LAB — CBC WITH DIFFERENTIAL/PLATELET
Basophils Absolute: 0.1 10*3/uL (ref 0.0–0.2)
Basos: 0 %
EOS (ABSOLUTE): 0.9 10*3/uL — ABNORMAL HIGH (ref 0.0–0.4)
EOS: 7 %
HEMATOCRIT: 45.6 % (ref 34.0–46.6)
HEMOGLOBIN: 14.9 g/dL (ref 11.1–15.9)
IMMATURE GRANS (ABS): 0 10*3/uL (ref 0.0–0.1)
IMMATURE GRANULOCYTES: 0 %
LYMPHS: 22 %
Lymphocytes Absolute: 3 10*3/uL (ref 0.7–3.1)
MCH: 29.6 pg (ref 26.6–33.0)
MCHC: 32.7 g/dL (ref 31.5–35.7)
MCV: 91 fL (ref 79–97)
MONOCYTES: 7 %
Monocytes Absolute: 0.9 10*3/uL (ref 0.1–0.9)
NEUTROS PCT: 64 %
Neutrophils Absolute: 8.5 10*3/uL — ABNORMAL HIGH (ref 1.4–7.0)
Platelets: 358 10*3/uL (ref 150–450)
RBC: 5.04 x10E6/uL (ref 3.77–5.28)
RDW: 14.1 % (ref 12.3–15.4)
WBC: 13.4 10*3/uL — ABNORMAL HIGH (ref 3.4–10.8)

## 2017-07-15 LAB — CMP14+EGFR
ALBUMIN: 4.3 g/dL (ref 3.5–5.5)
ALK PHOS: 95 IU/L (ref 39–117)
ALT: 32 IU/L (ref 0–32)
AST: 35 IU/L (ref 0–40)
Albumin/Globulin Ratio: 1.5 (ref 1.2–2.2)
BUN / CREAT RATIO: 15 (ref 9–23)
BUN: 13 mg/dL (ref 6–20)
Bilirubin Total: 0.2 mg/dL (ref 0.0–1.2)
CO2: 19 mmol/L — AB (ref 20–29)
CREATININE: 0.84 mg/dL (ref 0.57–1.00)
Calcium: 9.5 mg/dL (ref 8.7–10.2)
Chloride: 105 mmol/L (ref 96–106)
GFR calc Af Amer: 106 mL/min/{1.73_m2} (ref >59)
GFR calc non Af Amer: 92 mL/min/{1.73_m2} (ref >59)
GLUCOSE: 99 mg/dL (ref 65–99)
Globulin, Total: 2.8 g/dL (ref 1.5–4.5)
Potassium: 4.6 mmol/L (ref 3.5–5.2)
Sodium: 137 mmol/L (ref 134–144)
Total Protein: 7.1 g/dL (ref 6.0–8.5)

## 2017-07-15 LAB — CLOSTRIDIUM DIFFICILE BY PCR: CDIFFPCR: NEGATIVE

## 2017-07-15 LAB — LIPASE: LIPASE: 21 U/L (ref 14–72)

## 2017-07-17 ENCOUNTER — Other Ambulatory Visit: Payer: Self-pay | Admitting: Family Medicine

## 2017-07-17 MED ORDER — AMOXICILLIN-POT CLAVULANATE 875-125 MG PO TABS: 1.0000 | ORAL_TABLET | Freq: Two times a day (BID) | ORAL | 0 refills | Status: DC

## 2017-07-18 ENCOUNTER — Encounter: Payer: Self-pay | Admitting: Gastroenterology

## 2017-07-20 LAB — CULTURE, BLOOD (SINGLE)

## 2017-07-27 ENCOUNTER — Telehealth: Payer: Self-pay | Admitting: Gastroenterology

## 2017-07-27 ENCOUNTER — Ambulatory Visit: Payer: Self-pay | Admitting: Gastroenterology

## 2017-08-10 ENCOUNTER — Other Ambulatory Visit: Payer: Self-pay | Admitting: Family Medicine

## 2017-08-19 ENCOUNTER — Other Ambulatory Visit: Payer: Self-pay | Admitting: Family Medicine

## 2017-09-11 IMAGING — CT CT RENAL STONE PROTOCOL
2 of 4 series · 17 of 46 positions shown, 19 images · non-contrast
Comparison: CT 01/16/2015

CLINICAL DATA: Left flank pain for 1 day.

EXAM:
CT ABDOMEN AND PELVIS WITHOUT CONTRAST
TECHNIQUE: Multidetector CT imaging of the abdomen and pelvis was performed
following the standard protocol without IV contrast.

[Series 2: standard/full over (age)lbs 5.0 · axial · 0.98mm/px · z∈[-536,-61]mm · 14 of 105 slices shown, 16 images]
[im 5/105  soft-tissue]
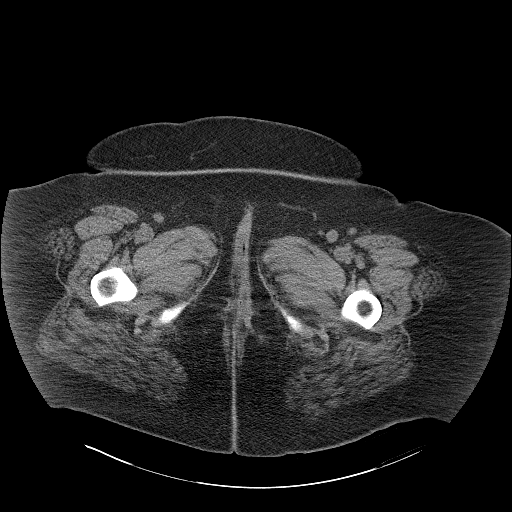
[im 5/105  bone]
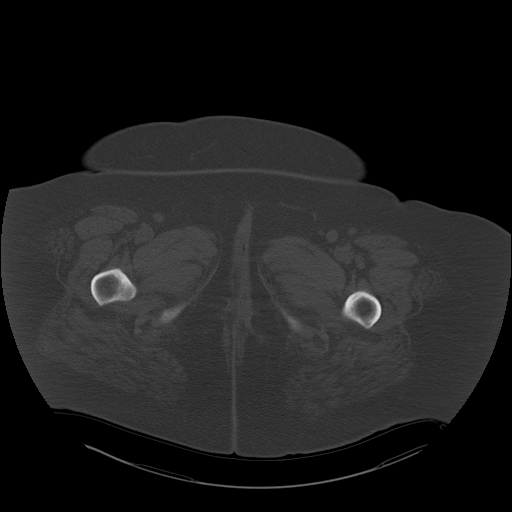
[im 15/105  soft-tissue]
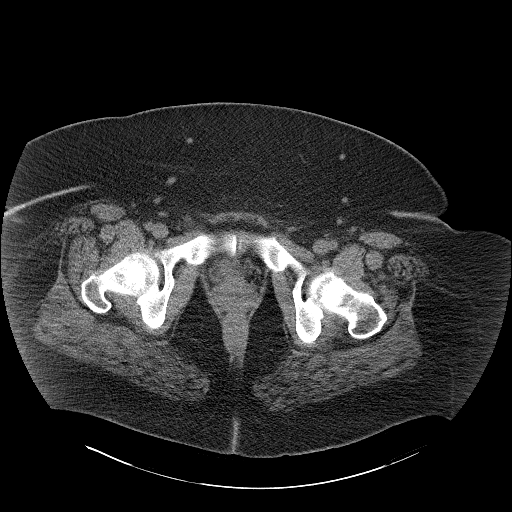
[im 19/105  soft-tissue]
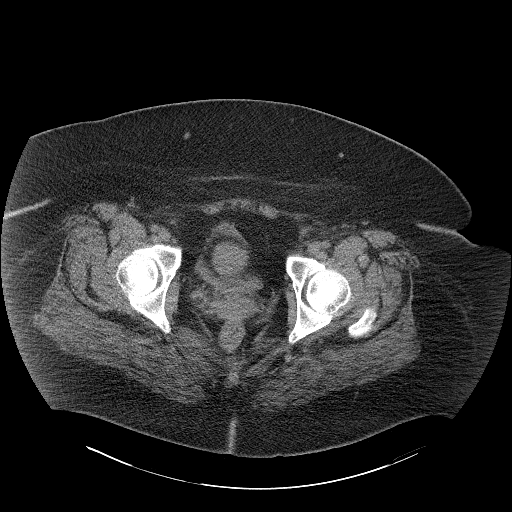
[im 29/105  soft-tissue]
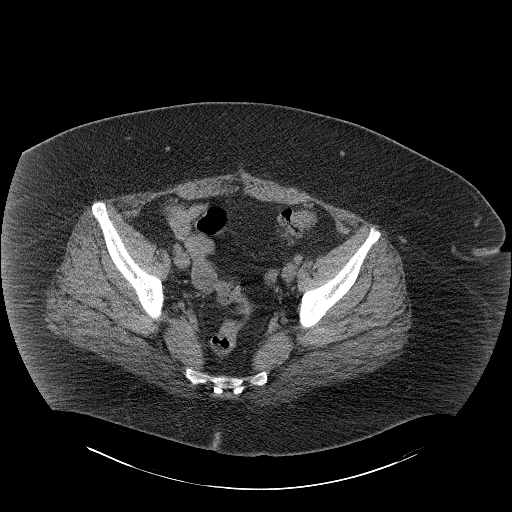
[im 34/105  soft-tissue]
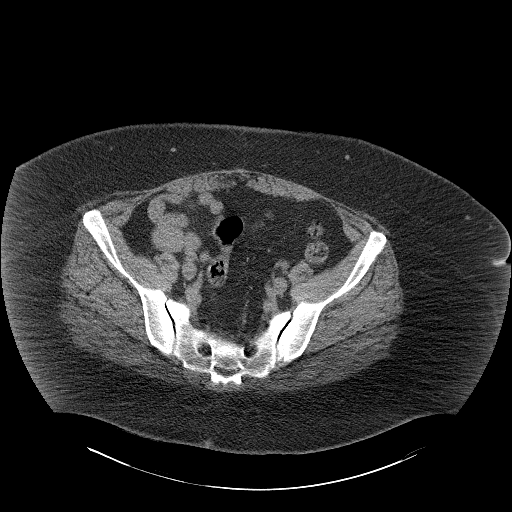
[im 43/105  soft-tissue]
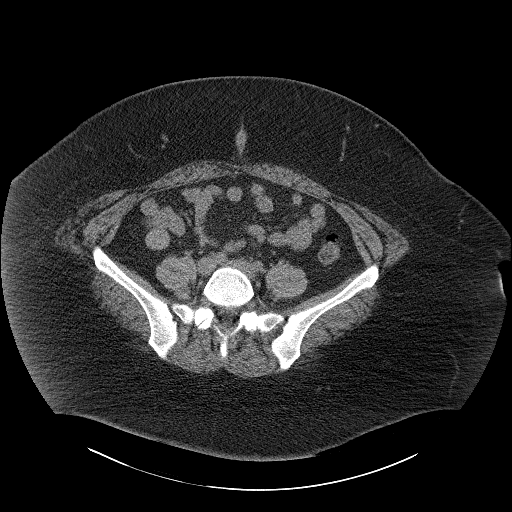
[im 48/105  soft-tissue]
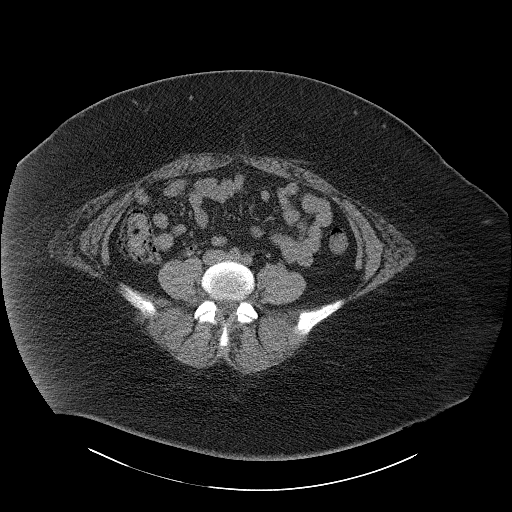
[im 57/105  soft-tissue]
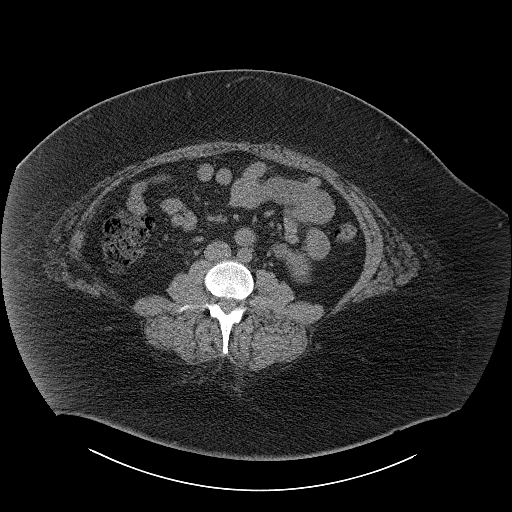
[im 62/105  soft-tissue]
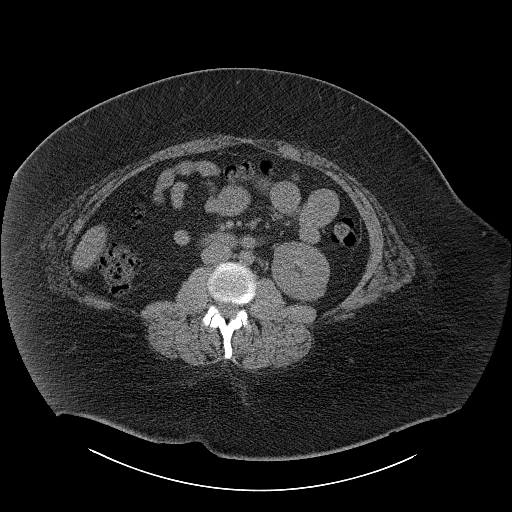
[im 62/105  bone]
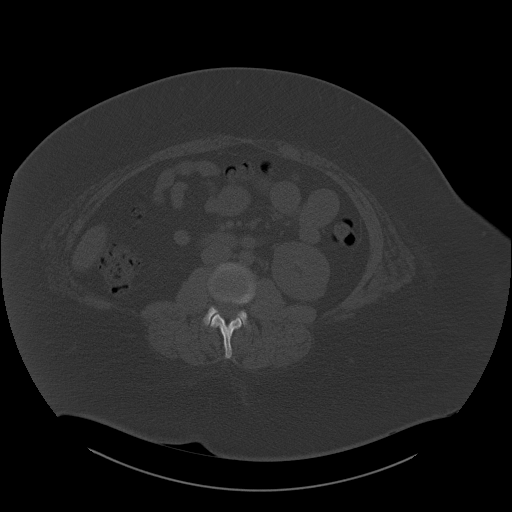
[im 71/105  soft-tissue]
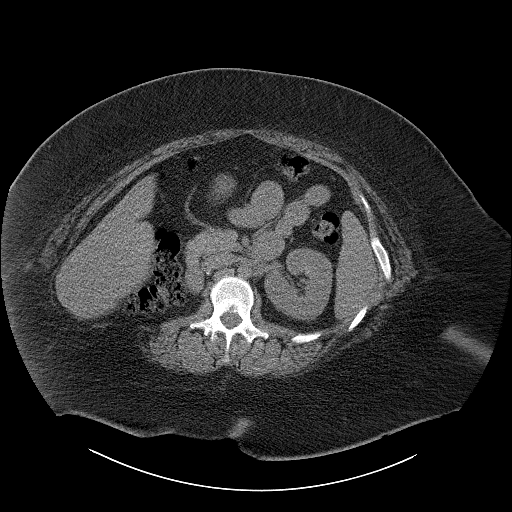
[im 76/105  soft-tissue]
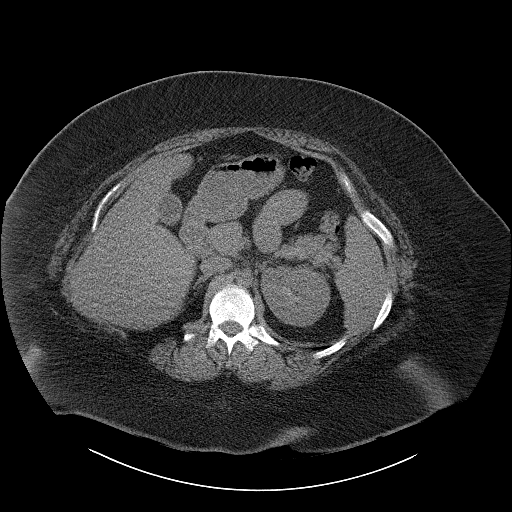
[im 86/105  soft-tissue]
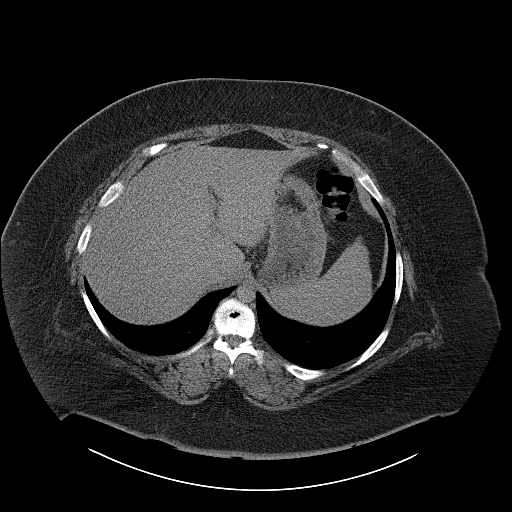
[im 90/105  soft-tissue]
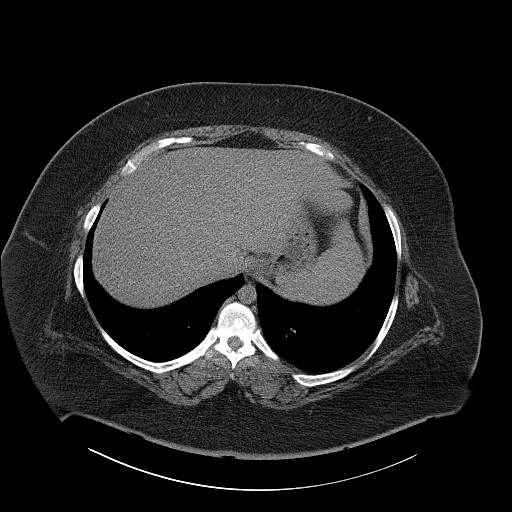
[im 100/105  soft-tissue]
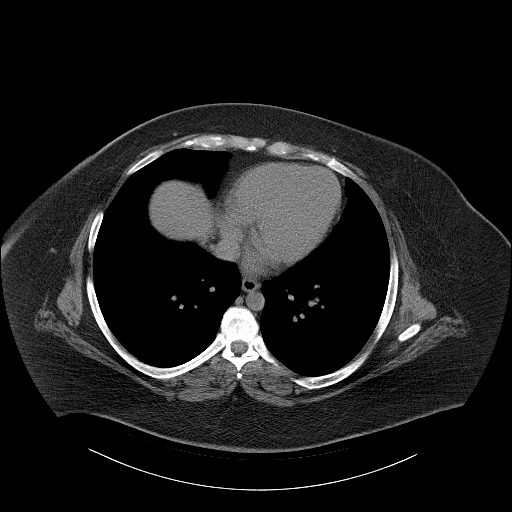

[Series 3: mpr coronal · coronal · 1.03mm/px · 3 of 102 slices shown]
[im 34/102  soft-tissue]
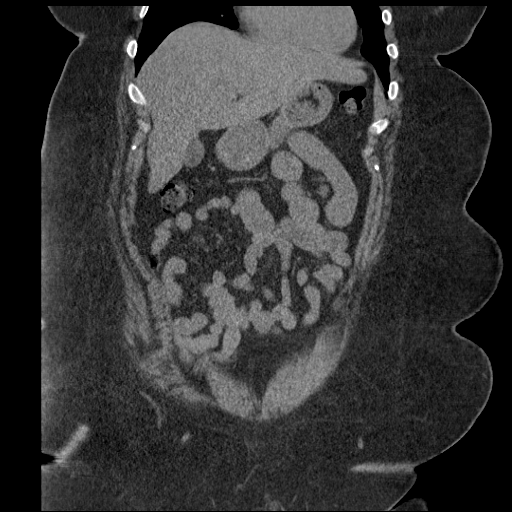
[im 45/102  soft-tissue]
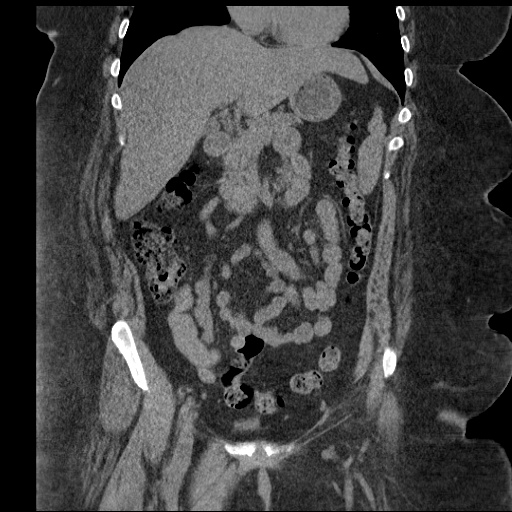
[im 57/102  soft-tissue]
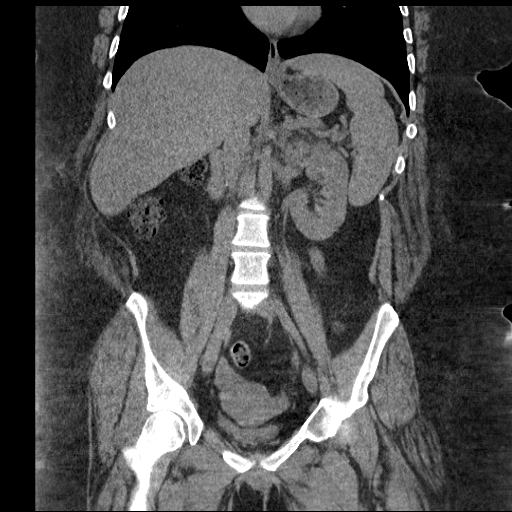

[17 of 46 positions shown; findings below may reference images not displayed]

FINDINGS: Lower chest:  The included lung bases are clear.

Liver: Prominent size with steatosis.  No evidence of focal lesion.

Hepatobiliary: Gallstone within physiologically distended
gallbladder. No pericholecystic inflammation.

Pancreas: No ductal dilatation or inflammation.

Spleen: Prominent in size measuring 14.9 cm craniocaudal.

Adrenal glands: No nodule.

Kidneys: Post right nephrectomy. No left urolithiasis or
hydronephrosis. No perinephric stranding. Ureter is decompressed
without stones along its course.

Stomach/Bowel: Stomach physiologically distended. There are no
dilated or thickened small bowel loops. Small volume of stool
throughout the colon without colonic wall thickening. The appendix
is normal.

Vascular/Lymphatic: No retroperitoneal adenopathy. Abdominal aorta
is normal in caliber.

Reproductive: Uterus unremarkable for CT appearance. Ovaries
symmetric in size.

Bladder: Decompressed and not well evaluated.

Other: No free air, free fluid, or intra-abdominal fluid collection.

Musculoskeletal: There are no acute or suspicious osseous
abnormalities.
IMPRESSION: No renal stones or obstructive uropathy. Post right nephrectomy. No
acute abnormality in the abdomen/pelvis.

## 2017-09-19 ENCOUNTER — Emergency Department (HOSPITAL_COMMUNITY)
Admission: EM | Admit: 2017-09-19 | Discharge: 2017-09-19 | Disposition: A | Discharge status: Home / Self Care | Payer: Medicaid Other | Attending: Emergency Medicine | Admitting: Emergency Medicine

## 2017-09-19 ENCOUNTER — Encounter (HOSPITAL_COMMUNITY): Payer: Self-pay | Admitting: Emergency Medicine

## 2017-09-19 ENCOUNTER — Other Ambulatory Visit: Payer: Self-pay

## 2017-09-19 ENCOUNTER — Emergency Department (HOSPITAL_COMMUNITY): Payer: Medicaid Other

## 2017-09-19 ENCOUNTER — Other Ambulatory Visit: Payer: Self-pay | Admitting: Family Medicine

## 2017-09-19 DIAGNOSIS — Z905 Acquired absence of kidney: Secondary | ICD-10-CM | POA: Diagnosis not present

## 2017-09-19 DIAGNOSIS — N390 Urinary tract infection, site not specified: Secondary | ICD-10-CM

## 2017-09-19 DIAGNOSIS — R319 Hematuria, unspecified: Secondary | ICD-10-CM | POA: Diagnosis present

## 2017-09-19 DIAGNOSIS — Z79899 Other long term (current) drug therapy: Secondary | ICD-10-CM | POA: Insufficient documentation

## 2017-09-19 DIAGNOSIS — N189 Chronic kidney disease, unspecified: Secondary | ICD-10-CM | POA: Diagnosis not present

## 2017-09-19 DIAGNOSIS — R109 Unspecified abdominal pain: Secondary | ICD-10-CM | POA: Insufficient documentation

## 2017-09-19 DIAGNOSIS — F17210 Nicotine dependence, cigarettes, uncomplicated: Secondary | ICD-10-CM | POA: Insufficient documentation

## 2017-09-19 DIAGNOSIS — I129 Hypertensive chronic kidney disease with stage 1 through stage 4 chronic kidney disease, or unspecified chronic kidney disease: Secondary | ICD-10-CM | POA: Diagnosis not present

## 2017-09-19 LAB — CBC WITH DIFFERENTIAL/PLATELET
BASOS PCT: 0 %
Basophils Absolute: 0.1 10*3/uL (ref 0.0–0.1)
Eosinophils Absolute: 0.9 10*3/uL — ABNORMAL HIGH (ref 0.0–0.7)
Eosinophils Relative: 7 %
HEMATOCRIT: 43.3 % (ref 36.0–46.0)
HEMOGLOBIN: 14.5 g/dL (ref 12.0–15.0)
LYMPHS ABS: 2.9 10*3/uL (ref 0.7–4.0)
LYMPHS PCT: 22 %
MCH: 30.8 pg (ref 26.0–34.0)
MCHC: 33.5 g/dL (ref 30.0–36.0)
MCV: 91.9 fL (ref 78.0–100.0)
Monocytes Absolute: 0.9 10*3/uL (ref 0.1–1.0)
Monocytes Relative: 7 %
Neutro Abs: 8.6 10*3/uL — ABNORMAL HIGH (ref 1.7–7.7)
Neutrophils Relative %: 64 %
Platelets: 300 10*3/uL (ref 150–400)
RBC: 4.71 MIL/uL (ref 3.87–5.11)
RDW: 13.4 % (ref 11.5–15.5)
WBC: 13.4 10*3/uL — AB (ref 4.0–10.5)

## 2017-09-19 LAB — URINALYSIS, ROUTINE W REFLEX MICROSCOPIC
BILIRUBIN URINE: NEGATIVE
Glucose, UA: NEGATIVE mg/dL
Ketones, ur: NEGATIVE mg/dL
NITRITE: NEGATIVE
PH: 6 (ref 5.0–8.0)
Protein, ur: 30 mg/dL — AB
RBC / HPF: >50 RBC/hpf — ABNORMAL HIGH (ref 0–5)
SPECIFIC GRAVITY, URINE: 1.015 (ref 1.005–1.030)
WBC, UA: >50 WBC/hpf — ABNORMAL HIGH (ref 0–5)

## 2017-09-19 LAB — BASIC METABOLIC PANEL
ANION GAP: 6 (ref 5–15)
BUN: 13 mg/dL (ref 6–20)
CHLORIDE: 110 mmol/L (ref 98–111)
CO2: 22 mmol/L (ref 22–32)
Calcium: 8.2 mg/dL — ABNORMAL LOW (ref 8.9–10.3)
Creatinine, Ser: 0.75 mg/dL (ref 0.44–1.00)
Glucose, Bld: 108 mg/dL — ABNORMAL HIGH (ref 70–99)
POTASSIUM: 3.7 mmol/L (ref 3.5–5.1)
SODIUM: 138 mmol/L (ref 135–145)

## 2017-09-19 LAB — HCG, QUANTITATIVE, PREGNANCY

## 2017-09-19 MED ORDER — ONDANSETRON HCL 4 MG PO TABS: 4.0000 mg | ORAL_TABLET | Freq: Four times a day (QID) | ORAL | 0 refills | Status: DC

## 2017-09-19 MED ORDER — CEPHALEXIN 500 MG PO CAPS
500.0000 mg | ORAL_CAPSULE | Freq: Once | ORAL | Status: AC
  Administered 2017-09-19: 500 mg via ORAL
  Filled 2017-09-19: qty 1

## 2017-09-19 MED ORDER — TRAMADOL HCL 50 MG PO TABS: 50.0000 mg | ORAL_TABLET | Freq: Four times a day (QID) | ORAL | 0 refills | Status: DC | PRN

## 2017-09-19 MED ORDER — HYDROMORPHONE HCL 1 MG/ML IJ SOLN
1.0000 mg | Freq: Once | INTRAMUSCULAR | Status: AC
  Administered 2017-09-19: 1 mg via INTRAMUSCULAR
  Filled 2017-09-19: qty 1

## 2017-09-19 MED ORDER — CEPHALEXIN 500 MG PO CAPS: 500.0000 mg | ORAL_CAPSULE | Freq: Four times a day (QID) | ORAL | 0 refills | Status: DC

## 2017-09-19 MED ORDER — ONDANSETRON 8 MG PO TBDP
8.0000 mg | ORAL_TABLET | Freq: Once | ORAL | Status: AC
  Administered 2017-09-19: 8 mg via ORAL
  Filled 2017-09-19: qty 1

## 2017-09-19 NOTE — ED Notes (Signed)
Patient transported to CT 

## 2017-09-19 NOTE — ED Notes (Signed)
ED Provider at bedside. 

## 2017-09-19 NOTE — Discharge Instructions (Addendum)
Drink plenty of water and cranberry juice.  Take the antibiotic as directed until it is finished.  Follow-up with your primary doctor for recheck of your urine.  Return to the ER for any worsening symptoms such as increasing pain, vomiting, or fever.

## 2017-09-19 NOTE — ED Triage Notes (Signed)
Patient complaining of lower abdominal pain with blood in urine starting this morning. Also complains of pain with urination.

## 2017-09-21 NOTE — ED Provider Notes (Signed)
[facility] EMERGENCY DEPARTMENT Provider Note   CSN: [id number] Arrival date & time: 09/19/17  1114     History   Chief Complaint Chief Complaint  Patient presents with  . Hematuria    HPI Alice MediaBrittany L Badolato is a 32 y.o. female.  HPI   Alice [name] is a 32 y.o. female history of right nephrectomy, presents to the Emergency Department complaining of left flank and lower abdominal pain that began on the morning of arrival.  She states her symptoms are associated with blood in her urine and pain associated with urination.  She reports history of previous kidney stone and chronic left flank pain.  States pain is worse than usual.  She also reports nausea without vomiting.  Fever, upper back pain, shortness of breath, vomiting, vaginal pain, discharge or bleeding.  Pain has not been relieved with Tylenol.   Past Medical History:  Diagnosis Date  . Chronic left flank pain   . Chronic pain   . Congenital obstruction of ureteropelvic junction (UPJ)   . 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 Fatty liver   . GERD (gastroesophageal reflux disease)   . History of kidney stones   . History of recurrent UTIs   . Hypertension   . Kidney disease   . Loin pain hematuria syndrome    [facility]  . Morbid obesity [facility](HCC)   . Preterm labor   . Round ligament pain 09/10/2012  . Torn ACL     Patient Active Problem List   Diagnosis Date Noted  . Mood disorder (HCC) 12/30/2014  . Smoker 05/13/2014  . Post-operative state 02/22/2013  . Elevated liver function tests 09/24/2012  . 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 History of recurrent UTI (urinary tract infection) 07/23/2012  . Obesity, morbid, BMI 50 or higher (HCC) 07/23/2012  . H/O preterm delivery, currently pregnant 07/23/2012    Past Surgical History:  Procedure Laterality Date  . addenoidectomy    . CESAREAN SECTION N/A 02/22/2013   Procedure: CESAREAN SECTION/TWINS;  Surgeon:  Lazaro ArmsLuther H Eure, MD;  Location: [facility] OR[facility];  Service: Obstetrics;  Laterality: N/A;  . GALLBLADDER SURGERY  2019  . NASAL SINUS SURGERY    . NEPHRECTOMY     right side  . OTHER SURGICAL HISTORY     ulner nerve removal  . TONSILLECTOMY       OB History    Gravida  2   Para  2   Term  1   Preterm  1   AB      Living  3     SAB      TAB      Ectopic      Multiple  1   Live Births  3            Home Medications    Prior to Admission medications   Medication Sig Start Date End Date Taking? Authorizing Provider  amoxicillin-clavulanate (AUGMENTIN) 875-125 MG tablet Take 1 tablet by mouth 2 (two) times daily. 07/17/17   Elenora GammaBradshaw, Samuel L, MD  cephALEXin (KEFLEX) 500 MG capsule Take 1 capsule (500 mg total) by mouth 4 (four) times daily. For 7 days 09/19/17   Pauline Ausriplett, Shaasia Odle, PA-C  omeprazole (PRILOSEC) 40 MG capsule TAKE 1 CAPSULE BY MOUTH EVERY DAY 07/11/17   Elenora GammaBradshaw, Samuel L, MD  ondansetron (ZOFRAN) 4 MG tablet Take 1 tablet (4 mg total) by mouth every 6 (six) hours. As needed for  nausea 09/19/17   Maliya Marich, PA-C  SUMAtriptan (IMITREX) 100 MG tablet TAKE 1/2 TO 1 TABLET FOR HEADACHE-MAY REPEAT IN 2 HRS IF PRESISTS OR RECURS-NO MORE THAN 2 IN 24 HRS 08/22/17   [name], MD  traMADol (ULTRAM) 50 MG tablet Take 1 tablet (50 mg total) by mouth every 6 (six) hours as needed. 09/19/17   Eleftherios Dudenhoeffer, PA-C  venlafaxine (EFFEXOR) 75 MG tablet TAKE 1 TABLET BY MOUTH TWICE A DAY 09/20/17   Mechele Claude, MD    Family History Family History  Problem Relation Age of Onset  . Hypertension Mother   . Hyperlipidemia Mother   . Heart disease Mother   . Heart disease Father        MI  . Alcohol abuse Father   . Alcohol abuse Brother   . Diabetes Maternal Grandmother   . Hyperlipidemia Maternal Grandmother     Social History Social History   Tobacco Use  . Smoking status: Current Every Day Smoker    Packs/day: 0.50    Years: 0.50    Pack years: 0.25    Types:  Cigarettes    Last attempt to quit: 06/21/2012    Years since quitting: 5.2  . Smokeless tobacco: Never Used  Substance Use Topics  . Alcohol use: No  . Drug use: No     Allergies   Reglan [metoclopramide]; Cinnamon flavor; Codeine; Flomax [tamsulosin]; Ibuprofen; Levofloxacin; and Tylenol [acetaminophen]   Review of Systems Review of Systems  Constitutional: Negative for activity change, appetite change, chills and fever.  Respiratory: Negative for chest tightness and shortness of breath.   Gastrointestinal: Positive for nausea. Negative for abdominal pain and vomiting.  Genitourinary: Positive for dysuria, flank pain, frequency and hematuria. Negative for decreased urine volume, difficulty urinating, urgency, vaginal bleeding and vaginal discharge.  Musculoskeletal: Negative for back pain.  Skin: Negative for rash.  Neurological: Negative for dizziness, weakness and numbness.  Hematological: Negative for adenopathy.  Psychiatric/Behavioral: Negative for confusion.  All other systems reviewed and are negative.    Physical Exam Updated Vital Signs BP (!) 158/98 (BP Location: Right Arm)   Pulse 83   Temp 97.9 F (36.6 C) (Oral)   Resp 12   Ht 5\' 9"  (1.753 m)   Wt (!) 167.8 kg   LMP 09/09/2017   SpO2 95%   BMI 54.64 kg/m   Physical Exam  Constitutional: She appears well-nourished. No distress.  Patient is obese, uncomfortable appearing  HENT:  Head: Normocephalic and atraumatic.  Cardiovascular: Normal rate, regular rhythm and intact distal pulses.  No murmur heard. Pulmonary/Chest: Effort normal and breath sounds normal. No respiratory distress. She has no wheezes. She has no rales.  Abdominal: Soft. Normal appearance. She exhibits no distension and no mass. There is no hepatosplenomegaly. There is tenderness in the suprapubic area. There is no rigidity, no rebound, no guarding, no CVA tenderness and no tenderness at McBurney's point.  Mild ttp of the suprapubic  region.  Left-sided CVA tenderness.  Remaining abdomen is soft, non-tender without guarding or rebound tenderness.   Musculoskeletal: Normal range of motion. She exhibits no edema.  Neurological: She is alert. No sensory deficit.  Skin: Skin is warm and dry. No rash noted.  Nursing note and vitals reviewed.    ED Treatments / Results  Labs (all labs ordered are listed, but only abnormal results are displayed) Labs Reviewed  URINE CULTURE - Abnormal; Notable for the following components:      Result Value  Culture 70,000 COLONIES/mL ESCHERICHIA COLI (*)    All other components within normal limits  URINALYSIS, ROUTINE W REFLEX MICROSCOPIC - Abnormal; Notable for the following components:   APPearance CLOUDY (*)    Hgb urine dipstick MODERATE (*)    Protein, ur 30 (*)    Leukocytes, UA LARGE (*)    RBC / HPF >50 (*)    WBC, UA >50 (*)    Bacteria, UA RARE (*)    All other components within normal limits  BASIC METABOLIC PANEL - Abnormal; Notable for the following components:   Glucose, Bld 108 (*)    Calcium 8.2 (*)    All other components within normal limits  CBC WITH DIFFERENTIAL/PLATELET - Abnormal; Notable for the following components:   WBC 13.4 (*)    Neutro Abs 8.6 (*)    Eosinophils Absolute 0.9 (*)    All other components within normal limits  HCG, QUANTITATIVE, PREGNANCY    EKG None  Radiology Ct Renal Stone Study  Result Date: 09/19/2017 CLINICAL DATA:  Pelvic pain and hematuria EXAM: CT ABDOMEN AND PELVIS WITHOUT CONTRAST TECHNIQUE: Multidetector CT imaging of the abdomen and pelvis was performed following the standard protocol without IV contrast. COMPARISON:  None. FINDINGS: Lower chest: No acute abnormality. Hepatobiliary: No focal liver abnormality is seen. Status post cholecystectomy. No biliary dilatation. Pancreas: Unremarkable. No pancreatic ductal dilatation or surrounding inflammatory changes. Spleen: Normal in size without focal abnormality.  Adrenals/Urinary Tract: The right kidney is been surgically removed. The adrenal glands are within normal limits bilaterally. The left kidney demonstrates no renal calculi or obstructive changes. The bladder is partially distended. Stomach/Bowel: Stomach is within normal limits. Appendix appears normal. No evidence of bowel wall thickening, distention, or inflammatory changes. Vascular/Lymphatic: No significant vascular findings are present. No enlarged abdominal or pelvic lymph nodes. Reproductive: Uterus and bilateral adnexa are unremarkable. Other: No abdominal wall hernia or abnormality. No abdominopelvic ascites. Musculoskeletal: No acute or significant osseous findings. IMPRESSION: Status post right nephrectomy. No acute abnormality noted. Electronically Signed   By: Alcide Clever M.D.   On: 09/19/2017 14:09     Procedures Procedures (including critical care time)  Medications Ordered in ED Medications  HYDROmorphone (DILAUDID) injection 1 mg (1 mg Intramuscular Given 09/19/17 1338)  ondansetron (ZOFRAN-ODT) disintegrating tablet 8 mg (8 mg Oral Given 09/19/17 1336)  cephALEXin (KEFLEX) capsule 500 mg (500 mg Oral Given 09/19/17 1431)     Initial Impression / Assessment and Plan / ED Course  I have reviewed the triage vital signs and the nursing notes.  Pertinent labs & imaging results that were available during my care of the patient were reviewed by me and considered in my medical decision making (see chart for details).     Patient with history of chronic left flank pain.  CT negative for ureteral calculi.  No history of fever, vomiting.  Possible developing pyelonephritis.  Kidney function reassuring.  Will treat with antibiotics, urine culture is pending.  Patient appears safe for discharge home, pain has improved and patient reports feeling better.  Return precautions discussed.  Final Clinical Impressions(s) / ED Diagnoses   Final diagnoses:  Flank pain  Urinary tract infection in  female    ED Discharge Orders         Ordered    cephALEXin (KEFLEX) 500 MG capsule  4 times daily     09/19/17 1431    traMADol (ULTRAM) 50 MG tablet  Every 6 hours PRN     09/19/17  1431    ondansetron (ZOFRAN) 4 MG tablet  Every 6 hours     09/19/17 1431           Pauline Aus, PA-C 09/21/17 1422    Samuel Jester, DO 09/22/17 [phone]

## 2017-09-22 ENCOUNTER — Ambulatory Visit: Payer: Self-pay | Admitting: Gastroenterology

## 2017-09-22 LAB — URINE CULTURE

## 2017-09-23 ENCOUNTER — Telehealth: Payer: Self-pay

## 2017-09-23 NOTE — Telephone Encounter (Signed)
Post ED Visit - Positive Culture Follow-up  Culture report reviewed by antimicrobial stewardship pharmacist:  []  Enzo BiNathan Batchelder, Pharm.D. []  Celedonio MiyamotoJeremy Frens, Pharm.D., BCPS AQ-ID []  Garvin FilaMike Maccia, Pharm.D., BCPS []  Georgina PillionElizabeth Martin, Pharm.D., BCPS []  LortonMinh Pham, [address]Pharm[address]D., BCPS, AAHIVP []  Estella HuskMichelle Turner, Pharm.D., BCPS, AAHIVP [x]  Lysle Pearlachel Rumbarger, PharmD, BCPS []  Phillips Climeshuy Dang, PharmD, BCPS []  Agapito GamesAlison Masters, PharmD, BCPS []  Verlan FriendsErin Deja, PharmD  Positive urine culture Treated with Cephalexin, organism sensitive to the same and no further patient follow-up is required at this time.  Jerry CarasCullom, Kamarri Fischetti Burnett 09/23/2017, 9:39 AM

## 2017-10-09 ENCOUNTER — Other Ambulatory Visit: Payer: Self-pay

## 2017-10-09 MED ORDER — OMEPRAZOLE 40 MG PO CPDR: DELAYED_RELEASE_CAPSULE | ORAL | 0 refills | Status: DC

## 2017-11-07 ENCOUNTER — Other Ambulatory Visit: Payer: Self-pay | Admitting: Family Medicine

## 2017-12-08 ENCOUNTER — Other Ambulatory Visit: Payer: Self-pay | Admitting: Pediatrics

## 2017-12-11 NOTE — Telephone Encounter (Signed)
Last seen 07/14/17  Dr Ermalinda Memos

## 2017-12-25 ENCOUNTER — Encounter: Payer: Self-pay | Admitting: Family Medicine

## 2017-12-25 ENCOUNTER — Telehealth: Payer: Self-pay | Admitting: Family Medicine

## 2017-12-25 NOTE — Telephone Encounter (Signed)
Patient aware must have an  appointment scheduled with a provider and be seen for problem. Last visit was months back with a provider  who is no longer working here.
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
## 2018-01-04 ENCOUNTER — Other Ambulatory Visit: Payer: Self-pay | Admitting: Family Medicine

## 2018-01-05 NOTE — Telephone Encounter (Signed)
appt made

## 2018-01-05 NOTE — Telephone Encounter (Signed)
NTBS, Previous Ermalinda MemosBradshaw pt

## 2018-01-10 ENCOUNTER — Other Ambulatory Visit: Payer: Self-pay | Admitting: Family Medicine

## 2018-01-12 NOTE — Telephone Encounter (Signed)
Last seen 07/14/17  Dr Bradshaw 

## 2018-01-15 ENCOUNTER — Ambulatory Visit: Payer: Medicaid Other | Admitting: Family Medicine

## 2018-01-16 ENCOUNTER — Ambulatory Visit: Payer: Medicaid Other | Admitting: Family Medicine

## 2018-02-15 ENCOUNTER — Other Ambulatory Visit: Payer: Self-pay | Admitting: Family

## 2018-02-20 ENCOUNTER — Other Ambulatory Visit: Payer: Self-pay

## 2018-02-20 ENCOUNTER — Emergency Department (HOSPITAL_COMMUNITY)
Admission: EM | Admit: 2018-02-20 | Discharge: 2018-02-20 | Disposition: A | Discharge status: Home / Self Care | Payer: Medicaid Other | Attending: Emergency Medicine | Admitting: Emergency Medicine

## 2018-02-20 ENCOUNTER — Encounter (HOSPITAL_COMMUNITY): Payer: Self-pay | Admitting: Emergency Medicine

## 2018-02-20 ENCOUNTER — Emergency Department (HOSPITAL_COMMUNITY): Payer: Medicaid Other

## 2018-02-20 DIAGNOSIS — F17210 Nicotine dependence, cigarettes, uncomplicated: Secondary | ICD-10-CM | POA: Diagnosis not present

## 2018-02-20 DIAGNOSIS — Z9049 Acquired absence of other specified parts of digestive tract: Secondary | ICD-10-CM | POA: Insufficient documentation

## 2018-02-20 DIAGNOSIS — Z79899 Other long term (current) drug therapy: Secondary | ICD-10-CM | POA: Diagnosis not present

## 2018-02-20 DIAGNOSIS — R1084 Generalized abdominal pain: Secondary | ICD-10-CM | POA: Diagnosis not present

## 2018-02-20 DIAGNOSIS — R1110 Vomiting, unspecified: Secondary | ICD-10-CM | POA: Diagnosis present

## 2018-02-20 DIAGNOSIS — I10 Essential (primary) hypertension: Secondary | ICD-10-CM | POA: Diagnosis not present

## 2018-02-20 DIAGNOSIS — R197 Diarrhea, unspecified: Secondary | ICD-10-CM | POA: Insufficient documentation

## 2018-02-20 LAB — CBC
HCT: 47 % — ABNORMAL HIGH (ref 36.0–46.0)
Hemoglobin: 14.7 g/dL (ref 12.0–15.0)
MCH: 29.2 pg (ref 26.0–34.0)
MCHC: 31.3 g/dL (ref 30.0–36.0)
MCV: 93.4 fL (ref 80.0–100.0)
PLATELETS: 374 10*3/uL (ref 150–400)
RBC: 5.03 MIL/uL (ref 3.87–5.11)
RDW: 14.9 % (ref 11.5–15.5)
WBC: 10.3 10*3/uL (ref 4.0–10.5)
nRBC: 0.4 % — ABNORMAL HIGH (ref 0.0–0.2)

## 2018-02-20 LAB — COMPREHENSIVE METABOLIC PANEL
ALBUMIN: 4 g/dL (ref 3.5–5.0)
ALK PHOS: 76 U/L (ref 38–126)
ALT: 39 U/L (ref 0–44)
ANION GAP: 7 (ref 5–15)
AST: 28 U/L (ref 15–41)
BILIRUBIN TOTAL: 0.6 mg/dL (ref 0.3–1.2)
BUN: 11 mg/dL (ref 6–20)
CALCIUM: 9 mg/dL (ref 8.9–10.3)
CO2: 20 mmol/L — ABNORMAL LOW (ref 22–32)
Chloride: 109 mmol/L (ref 98–111)
Creatinine, Ser: 0.76 mg/dL (ref 0.44–1.00)
GFR calc Af Amer: >60 mL/min (ref >60)
GFR calc non Af Amer: >60 mL/min (ref >60)
GLUCOSE: 109 mg/dL — AB (ref 70–99)
Potassium: 3.7 mmol/L (ref 3.5–5.1)
SODIUM: 136 mmol/L (ref 135–145)
TOTAL PROTEIN: 7.6 g/dL (ref 6.5–8.1)

## 2018-02-20 LAB — URINALYSIS, ROUTINE W REFLEX MICROSCOPIC
Bilirubin Urine: NEGATIVE
GLUCOSE, UA: NEGATIVE mg/dL
Ketones, ur: NEGATIVE mg/dL
Nitrite: NEGATIVE
PH: 6 (ref 5.0–8.0)
Protein, ur: 100 mg/dL — AB
SPECIFIC GRAVITY, URINE: 1.021 (ref 1.005–1.030)

## 2018-02-20 LAB — LIPASE, BLOOD: Lipase: 33 U/L (ref 11–51)

## 2018-02-20 LAB — PREGNANCY, URINE: Preg Test, Ur: NEGATIVE

## 2018-02-20 MED ORDER — SODIUM CHLORIDE 0.9 % IV BOLUS
1000.0000 mL | Freq: Once | INTRAVENOUS | Status: AC
  Administered 2018-02-20: 1000 mL via INTRAVENOUS

## 2018-02-20 MED ORDER — TRAMADOL HCL 50 MG PO TABS: 50.0000 mg | ORAL_TABLET | Freq: Four times a day (QID) | ORAL | 0 refills | Status: DC | PRN

## 2018-02-20 MED ORDER — DIPHENOXYLATE-ATROPINE 2.5-0.025 MG PO TABS: 1.0000 | ORAL_TABLET | Freq: Four times a day (QID) | ORAL | 0 refills | Status: DC | PRN

## 2018-02-20 MED ORDER — IOPAMIDOL (ISOVUE-300) INJECTION 61%
150.0000 mL | Freq: Once | INTRAVENOUS | Status: AC | PRN
  Administered 2018-02-20: 125 mL via INTRAVENOUS

## 2018-02-20 MED ORDER — ONDANSETRON HCL 4 MG/2ML IJ SOLN
4.0000 mg | Freq: Once | INTRAMUSCULAR | Status: AC
  Administered 2018-02-20: 4 mg via INTRAVENOUS
  Filled 2018-02-20: qty 2

## 2018-02-20 MED ORDER — DIPHENOXYLATE-ATROPINE 2.5-0.025 MG PO TABS
2.0000 | ORAL_TABLET | Freq: Once | ORAL | Status: AC
  Administered 2018-02-20: 2 via ORAL
  Filled 2018-02-20: qty 2

## 2018-02-20 MED ORDER — HYDROMORPHONE HCL 1 MG/ML IJ SOLN
1.0000 mg | Freq: Once | INTRAMUSCULAR | Status: AC
  Administered 2018-02-20: 1 mg via INTRAVENOUS
  Filled 2018-02-20: qty 1

## 2018-02-20 NOTE — ED Provider Notes (Signed)
[facility] EMERGENCY DEPARTMENT Provider Note   CSN: [id number] Arrival date & time: 02/20/18  1318     History   Chief Complaint Chief Complaint  Patient presents with  . Abdominal Pain    HPI Alice [name] is a 33 y.o. female.  HPI   She presents for evaluation of vomiting and diarrhea, present for 2 days.  She has had 30 stools today which.  Light-colored and creamy, her report.  She is not taking anything for diarrhea or pain.  The pain is intermittent and crampy like.  She denies vomiting but has been nauseated.  She denies fever, chills, cough or chest pain.  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  She currently has some blood in her urine.  There are no other known modifying factors.  Past Medical History:  Diagnosis Date  . Chronic left flank pain   . Chronic pain   . Congenital obstruction of ureteropelvic junction (UPJ)   . 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 Fatty liver   . GERD (gastroesophageal reflux disease)   . History of kidney stones   . History of recurrent UTIs   . Hypertension   . Kidney disease   . Loin pain hematuria syndrome    [facility]ans [facility]  . Morbid obesity [facility](HCC)   . Preterm labor   . Round ligament pain 09/10/2012  . Torn ACL     Patient Active Problem List   Diagnosis Date Noted  . Mood disorder (HCC) 12/30/2014  . Smoker 05/13/2014  . Post-operative state 02/22/2013  . Elevated liver function tests 09/24/2012  . 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 History of recurrent UTI (urinary tract infection) 07/23/2012  . Obesity, morbid, BMI 50 or higher (HCC) 07/23/2012  . H/O preterm delivery, currently pregnant 07/23/2012    Past Surgical History:  Procedure Laterality Date  . addenoidectomy    . CESAREAN SECTION N/A 02/22/2013   Procedure: CESAREAN SECTION/TWINS;  Surgeon: Lazaro ArmsLuther H Eure, MD;  Location: [facility];  Service: Obstetrics;   Laterality: N/A;  . CHOLECYSTECTOMY    . GALLBLADDER SURGERY  2019  . NASAL SINUS SURGERY    . NEPHRECTOMY     right side  . OTHER SURGICAL HISTORY     ulner nerve removal  . TONSILLECTOMY       OB History    Gravida  2   Para  2   Term  1   Preterm  1   AB      Living  3     SAB      TAB      Ectopic      Multiple  1   Live Births  3            Home Medications    Prior to Admission medications   Medication Sig Start Date End Date Taking? Authorizing Provider  amoxicillin-clavulanate (AUGMENTIN) 875-125 MG tablet Take 1 tablet by mouth 2 (two) times daily. 07/17/17   Elenora GammaBradshaw, Samuel L, MD  cephALEXin (KEFLEX) 500 MG capsule Take 1 capsule (500 mg total) by mouth 4 (four) times daily. For 7 days 09/19/17   Pauline Ausriplett, Tammy, PA-C  omeprazole (PRILOSEC) 40 MG capsule TAKE 1 CAPSULE BY MOUTH EVERY DAY 10/09/17   Dettinger, Elige RadonJoshua A, MD  ondansetron (ZOFRAN) 4 MG tablet Take 1 tablet (4 mg total) by mouth every 6 (six) hours. As needed for  nausea 09/19/17   Triplett, Tammy, PA-C  SUMAtriptan (IMITREX) 100 MG tablet TAKE 1/2 TO 1 TABLET FOR HEADACHE-MAY REPEAT IN 2 HRS IF PRESISTS OR RECURS-NO MORE THAN 2 IN 24 HRS 08/22/17   Elenora GammaBradshaw, Samuel L, MD  traMADol (ULTRAM) 50 MG tablet Take 1 tablet (50 mg total) by mouth every 6 (six) hours as needed. 09/19/17   Triplett, Tammy, PA-C  venlafaxine (EFFEXOR) 75 MG tablet TAKE 1 TABLET BY MOUTH TWICE A DAY 01/12/18   Junie SpencerHawks, Christy A, FNP    Family History Family History  Problem Relation Age of Onset  . Hypertension Mother   . Hyperlipidemia Mother   . Heart disease Mother   . Heart disease Father        MI  . Alcohol abuse Father   . Alcohol abuse Brother   . Diabetes Maternal Grandmother   . Hyperlipidemia Maternal Grandmother     Social History Social History   Tobacco Use  . Smoking status: Current Every Day Smoker    Packs/day: 0.50    Years: 0.50    Pack years: 0.25    Types: Cigarettes    Last attempt to  quit: 06/21/2012    Years since quitting: 5.6  . Smokeless tobacco: Never Used  Substance Use Topics  . Alcohol use: No  . Drug use: No     Allergies   Reglan [metoclopramide]; Cinnamon flavor; Codeine; Flomax [tamsulosin]; Ibuprofen; Levofloxacin; and Tylenol [acetaminophen]   Review of Systems Review of Systems  All other systems reviewed and are negative.    Physical Exam Updated Vital Signs BP (!) 151/104   Pulse 98   Temp 98 F (36.7 C) (Oral)   Resp 19   Ht 5\' 9"  (1.753 m)   Wt (!) 145.2 kg   LMP 02/20/2018   SpO2 100%   BMI 47.26 kg/m   Physical Exam Vitals signs and nursing note reviewed.  Constitutional:      General: She is in acute distress.     Appearance: She is well-developed. She is obese. She is ill-appearing. She is not toxic-appearing or diaphoretic.  HENT:     Head: Normocephalic and atraumatic.     Right Ear: External ear normal.     Left Ear: External ear normal.     Mouth/Throat:     Pharynx: No pharyngeal swelling or oropharyngeal exudate.  Eyes:     General: No scleral icterus.    Extraocular Movements: Extraocular movements intact.     Conjunctiva/sclera: Conjunctivae normal.     Pupils: Pupils are equal, round, and reactive to light.  Neck:     Musculoskeletal: Normal range of motion and neck supple.     Trachea: Phonation normal.  Cardiovascular:     Rate and Rhythm: Normal rate and regular rhythm.     Heart sounds: Normal heart sounds.  Pulmonary:     Effort: Pulmonary effort is normal.     Breath sounds: Normal breath sounds.  Abdominal:     Palpations: Abdomen is soft.     Tenderness: There is no abdominal tenderness.     Hernia: No hernia is present.  Musculoskeletal: Normal range of motion.  Skin:    General: Skin is warm and dry.  Neurological:     Mental Status: She is alert and oriented to person, place, and time.     Cranial Nerves: No cranial nerve deficit.     Sensory: No sensory deficit.     Motor: No abnormal  muscle tone.  Coordination: Coordination normal.  Psychiatric:        Mood and Affect: Mood normal.        Behavior: Behavior normal.        Thought Content: Thought content normal.        Judgment: Judgment normal.      ED Treatments / Results  Labs (all labs ordered are listed, but only abnormal results are displayed) Labs Reviewed  COMPREHENSIVE METABOLIC PANEL - Abnormal; Notable for the following components:      Result Value   CO2 20 (*)    Glucose, Bld 109 (*)    All other components within normal limits  CBC - Abnormal; Notable for the following components:   HCT 47.0 (*)    nRBC 0.4 (*)    All other components within normal limits  URINALYSIS, ROUTINE W REFLEX MICROSCOPIC - Abnormal; Notable for the following components:   Color, Urine AMBER (*)    APPearance CLOUDY (*)    Hgb urine dipstick LARGE (*)    Protein, ur 100 (*)    Leukocytes, UA SMALL (*)    RBC / HPF >50 (*)    Bacteria, UA RARE (*)    All other components within normal limits  LIPASE, BLOOD  PREGNANCY, URINE    EKG None  Radiology Ct Abdomen Pelvis W Contrast  Result Date: 02/20/2018 CLINICAL DATA:  33 y/o F; mid to lower abdominal pain, nausea, and diarrhea for 2 days. History of LPHS and right nephrectomy. EXAM: CT ABDOMEN AND PELVIS WITH CONTRAST TECHNIQUE: Multidetector CT imaging of the abdomen and pelvis was performed using the standard protocol following bolus administration of intravenous contrast. CONTRAST:  ISOVUE-300 IOPAMIDOL (ISOVUE-300) INJECTION 61% COMPARISON:  09/19/2017 CT abdomen and pelvis FINDINGS: Lower chest: No acute abnormality. Hepatobiliary: No focal liver abnormality is seen. Status post cholecystectomy. No biliary dilatation. Pancreas: Unremarkable. No pancreatic ductal dilatation or surrounding inflammatory changes. Spleen: Normal in size without focal abnormality. Adrenals/Urinary Tract: Adrenal glands are unremarkable. Right nephrectomy. The left kidney is  normal, without renal calculi, focal lesion, or hydronephrosis. Bladder is unremarkable. Stomach/Bowel: Stomach is within normal limits. Appendix appears normal. No evidence of bowel wall thickening, distention, or inflammatory changes. Vascular/Lymphatic: No significant vascular findings are present. No enlarged abdominal or pelvic lymph nodes. Reproductive: Uterus and bilateral adnexa are unremarkable. Other: No abdominal wall hernia or abnormality. No abdominopelvic ascites. Musculoskeletal: No acute or significant osseous findings. IMPRESSION: 1. No acute process identified as explanation for abdominal pain. 2. Right nephrectomy. Otherwise unremarkable CT of abdomen and pelvis. Electronically Signed   By: Mitzi Hansen M.D.   On: 02/20/2018 16:30    Procedures Procedures (including critical care time)  Medications Ordered in ED Medications  sodium chloride 0.9 % bolus 1,000 mL (0 mLs Intravenous Stopped 02/20/18 1625)  HYDROmorphone (DILAUDID) injection 1 mg (1 mg Intravenous Given 02/20/18 1528)  ondansetron (ZOFRAN) injection 4 mg (4 mg Intravenous Given 02/20/18 1527)  diphenoxylate-atropine (LOMOTIL) 2.5-0.025 MG per tablet 2 tablet (2 tablets Oral Given 02/20/18 1526)  iopamidol (ISOVUE-300) 61 % injection 150 mL (125 mLs Intravenous Contrast Given 02/20/18 1621)     Initial Impression / Assessment and Plan / ED Course  I have reviewed the triage vital signs and the nursing notes.  Pertinent labs & imaging results that were available during my care of the patient were reviewed by me and considered in my medical decision making (see chart for details).  Clinical Course as of Feb 20 1645  Tue  Feb 20, 2018  1642 Normal  Pregnancy, urine [EW]  1642 Normal  CBC(!) [EW]  1642 Normal except CO2 low, glucose high  Comprehensive metabolic panel(!) [EW]  1642 Normal except extent positive for large blood, protein and leukocytes.  Also abnormal microscopic with greater than 50 red cells  per high-power field, and increased WBC with rare bacteria.  Urinalysis, Routine w reflex microscopic(!) [EW]  1644 No acute intra-abdominal or pelvic disorder.  Images reviewed by me  CT Abdomen Pelvis W Contrast [EW]    Clinical Course User Index [EW] Mancel Bale, MD     Patient Vitals for the past 24 hrs:  BP Temp Temp src Pulse Resp SpO2 Height Weight  02/20/18 1631 (!) 151/104 - - 98 - 100 % - -  02/20/18 1622 (!) 143/101 - - 97 - 100 % - -  02/20/18 1346 - - - - - - - (!) 145.2 kg  02/20/18 1345 - - - - - - 5\' 9"  (1.753 m) (!) 167.8 kg  02/20/18 1344 (!) 141/109 98 F (36.7 C) Oral 97 19 99 % - -    4:47 PM Reevaluation with update and discussion. After initial assessment and treatment, an updated evaluation reveals she states her symptoms have improved with treatment.  She has no further complaints.Mancel Bale   Medical Decision Making: Patient presenting with abdominal pain and diarrhea, somewhat concerning history with prior cholecystectomy and nephrectomy.  Chronic urologic disorder with ongoing hematuria without specific evidence for UTI.  CT imaging was done and is reassuring that there is no acute, severe, intestinal disorder.  Therefore the patient is a candidate for discharge with symptomatic treatment.  CRITICAL CARE-no Performed by: Mancel Bale  Nursing Notes Reviewed/ Care Coordinated Applicable Imaging Reviewed Interpretation of Laboratory Data incorporated into ED treatment  The patient appears reasonably screened and/or stabilized for discharge and I doubt any other medical condition or other [facility] requiring further screening, evaluation, or treatment in the ED at this time prior to discharge.  Plan: Home Medications-continue usual home medications; Home Treatments-gradually advance diet; return here if the recommended treatment, does not improve the symptoms; Recommended follow up-PCP and GI PRN    Final Clinical Impressions(s) / ED Diagnoses    Final diagnoses:  None    ED Discharge Orders    None       Mancel Bale, MD 02/20/18 1658

## 2018-02-20 NOTE — ED Triage Notes (Signed)
Patient complains of abdominal pain, nausea and diarrhea x 2 days. Patient states she has been unable to eat or drink. States fever and chills.

## 2018-02-20 NOTE — Discharge Instructions (Signed)
The testing today is reassuring.  We are supplying a prescription for control of diarrhea and one for pain.  Both of these can cause sleepiness, so be careful and do not operate a vehicle when you use them.  Follow-up with your PCP or the GI doctor you are referred to, for problems.  Make sure that you are drinking plenty of fluids to keep yourself hydrated.

## 2018-05-23 ENCOUNTER — Other Ambulatory Visit: Payer: Self-pay

## 2018-05-23 ENCOUNTER — Emergency Department (HOSPITAL_COMMUNITY)
Admission: EM | Admit: 2018-05-23 | Discharge: 2018-05-23 | Disposition: A | Discharge status: Home / Self Care | Payer: Medicaid Other | Attending: Emergency Medicine | Admitting: Emergency Medicine

## 2018-05-23 ENCOUNTER — Encounter (HOSPITAL_COMMUNITY): Payer: Self-pay

## 2018-05-23 DIAGNOSIS — I10 Essential (primary) hypertension: Secondary | ICD-10-CM | POA: Diagnosis not present

## 2018-05-23 DIAGNOSIS — R197 Diarrhea, unspecified: Secondary | ICD-10-CM | POA: Diagnosis present

## 2018-05-23 DIAGNOSIS — Z79899 Other long term (current) drug therapy: Secondary | ICD-10-CM | POA: Insufficient documentation

## 2018-05-23 DIAGNOSIS — R110 Nausea: Secondary | ICD-10-CM | POA: Diagnosis not present

## 2018-05-23 DIAGNOSIS — F17210 Nicotine dependence, cigarettes, uncomplicated: Secondary | ICD-10-CM | POA: Diagnosis not present

## 2018-05-23 LAB — CBC
HCT: 42.4 % (ref 36.0–46.0)
Hemoglobin: 13.5 g/dL (ref 12.0–15.0)
MCH: 30.1 pg (ref 26.0–34.0)
MCHC: 31.8 g/dL (ref 30.0–36.0)
MCV: 94.4 fL (ref 80.0–100.0)
Platelets: 328 10*3/uL (ref 150–400)
RBC: 4.49 MIL/uL (ref 3.87–5.11)
RDW: 13.6 % (ref 11.5–15.5)
WBC: 11.4 10*3/uL — ABNORMAL HIGH (ref 4.0–10.5)
nRBC: 0 % (ref 0.0–0.2)

## 2018-05-23 LAB — COMPREHENSIVE METABOLIC PANEL
ALT: 28 U/L (ref 0–44)
AST: 21 U/L (ref 15–41)
Albumin: 3.7 g/dL (ref 3.5–5.0)
Alkaline Phosphatase: 88 U/L (ref 38–126)
Anion gap: 9 (ref 5–15)
BUN: 9 mg/dL (ref 6–20)
CO2: 21 mmol/L — ABNORMAL LOW (ref 22–32)
Calcium: 8.4 mg/dL — ABNORMAL LOW (ref 8.9–10.3)
Chloride: 107 mmol/L (ref 98–111)
Creatinine, Ser: 0.63 mg/dL (ref 0.44–1.00)
GFR calc Af Amer: >60 mL/min (ref >60)
GFR calc non Af Amer: >60 mL/min (ref >60)
Glucose, Bld: 100 mg/dL — ABNORMAL HIGH (ref 70–99)
Potassium: 3.9 mmol/L (ref 3.5–5.1)
Sodium: 137 mmol/L (ref 135–145)
Total Bilirubin: 0.4 mg/dL (ref 0.3–1.2)
Total Protein: 7 g/dL (ref 6.5–8.1)

## 2018-05-23 LAB — PREGNANCY, URINE: Preg Test, Ur: NEGATIVE

## 2018-05-23 LAB — URINALYSIS, ROUTINE W REFLEX MICROSCOPIC
Bilirubin Urine: NEGATIVE
Glucose, UA: NEGATIVE mg/dL
Hgb urine dipstick: NEGATIVE
Ketones, ur: NEGATIVE mg/dL
Nitrite: NEGATIVE
Protein, ur: NEGATIVE mg/dL
Specific Gravity, Urine: 1.008 (ref 1.005–1.030)
pH: 6 (ref 5.0–8.0)

## 2018-05-23 MED ORDER — SODIUM CHLORIDE 0.9 % IV SOLN: 1000.0000 mL | INTRAVENOUS | Status: DC

## 2018-05-23 MED ORDER — ONDANSETRON 8 MG PO TBDP: 8.0000 mg | ORAL_TABLET | Freq: Three times a day (TID) | ORAL | 0 refills | Status: DC | PRN

## 2018-05-23 MED ORDER — FAMOTIDINE 20 MG PO TABS: 20.0000 mg | ORAL_TABLET | Freq: Two times a day (BID) | ORAL | 0 refills | Status: DC

## 2018-05-23 MED ORDER — SODIUM CHLORIDE 0.9 % IV BOLUS (SEPSIS)
1000.0000 mL | Freq: Once | INTRAVENOUS | Status: AC
  Administered 2018-05-23: 1000 mL via INTRAVENOUS

## 2018-05-23 MED ORDER — ONDANSETRON HCL 4 MG/2ML IJ SOLN
4.0000 mg | Freq: Once | INTRAMUSCULAR | Status: AC
  Administered 2018-05-23: 4 mg via INTRAVENOUS
  Filled 2018-05-23: qty 2

## 2018-05-23 NOTE — ED Provider Notes (Signed)
[facility] EMERGENCY DEPARTMENT Provider Note   CSN: [id number] Arrival date & time: 05/23/18  1004    History   Chief Complaint Chief Complaint  [facility]    HPI Alice [name] is a 33 y.o. female.     HPI Patient presents to the emergency room for evaluation of diarrhea and a metallic burning taste in her mouth.  Patient states she was at a cookout over the weekend.  She had baked beans on Sunday.  Patient states she was there with another couple.  Patient states the one spouse thinks that she is having an affair her wife.  The patient is afraid that that person poisoned her with the baked beans.  Since Sunday she is having diarrhea and feels that something isn't right.  She started gurgling and is now afraid that she might have overdosed on antifreeze.  Patient also states that many people are living in her house right now and she was drinking [facility] and wonders if 1 of those bottles was actually antifreeze.  Person that Bernarda CaffeySibley poisoned her with the baked beans does not live in the house with her.  Patient does not have a reason why anyone would actually put antifreeze in the refrigerator and it was a [employer] bottle.  Patient cannot really recall if it was carbonate or not but she thinks it was flat and may be tasted funny. Past Medical History:  Diagnosis Date  . Chronic left flank pain   . Chronic pain   . Congenital obstruction of ureteropelvic junction (UPJ)   . 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 Fatty liver   . GERD (gastroesophageal reflux disease)   . History of kidney stones   . History of recurrent UTIs   . Hypertension   . Kidney disease   . Loin pain hematuria syndrome    [facility]  . Morbid obesity [facility](HCC)   . Preterm labor   . Round ligament pain 09/10/2012  . Torn ACL     Patient Active Problem List   Diagnosis Date Noted  . Mood disorder (HCC) 12/30/2014  . Smoker  05/13/2014  . Post-operative state 02/22/2013  . Elevated liver function tests 09/24/2012  . 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 History of recurrent UTI (urinary tract infection) 07/23/2012  . Obesity, morbid, BMI 50 or higher (HCC) 07/23/2012  . H/O preterm delivery, currently pregnant 07/23/2012    Past Surgical History:  Procedure Laterality Date  . addenoidectomy    . CESAREAN SECTION N/A 02/22/2013   Procedure: CESAREAN SECTION/TWINS;  Surgeon: Lazaro ArmsLuther H Eure, MD;  Location: [facility];  Service: Obstetrics;  Laterality: N/A;  . CHOLECYSTECTOMY    . GALLBLADDER SURGERY  2019  . NASAL SINUS SURGERY    . NEPHRECTOMY     right side  . OTHER SURGICAL HISTORY     ulner nerve removal  . TONSILLECTOMY       OB History    Gravida  2   Para  2   Term  1   Preterm  1   AB      Living  3     SAB      TAB      Ectopic      Multiple  1   Live Births  3            Home Medications    Prior to Admission medications  Medication Sig Start Date End Date Taking? Authorizing Provider  amoxicillin-clavulanate (AUGMENTIN) 875-125 MG tablet Take 1 tablet by mouth 2 (two) times daily. 07/17/17   [name], MD  cephALEXin (KEFLEX) 500 MG capsule Take 1 capsule (500 mg total) by mouth 4 (four) times daily. For 7 days 09/19/17   Triplett, Tammy, PA-C  diphenoxylate-atropine (LOMOTIL) 2.5-0.025 MG tablet Take 1 tablet by mouth 4 (four) times daily as needed for diarrhea or loose stools. 02/20/18   Mancel Bale, MD  famotidine (PEPCID) 20 MG tablet Take 1 tablet (20 mg total) by mouth 2 (two) times daily. 05/23/18   Linwood Dibbles, MD  omeprazole (PRILOSEC) 40 MG capsule TAKE 1 CAPSULE BY MOUTH EVERY DAY 10/09/17   Dettinger, Elige Radon, MD  ondansetron (ZOFRAN ODT) 8 MG disintegrating tablet Take 1 tablet (8 mg total) by mouth every 8 (eight) hours as needed for nausea or vomiting. 05/23/18   Linwood Dibbles, MD  ondansetron (ZOFRAN) 4 MG tablet Take 1 tablet (4 mg total) by  mouth every 6 (six) hours. As needed for nausea 09/19/17   Triplett, Tammy, PA-C  SUMAtriptan (IMITREX) 100 MG tablet TAKE 1/2 TO 1 TABLET FOR HEADACHE-MAY REPEAT IN 2 HRS IF PRESISTS OR RECURS-NO MORE THAN 2 IN 24 HRS 08/22/17   [name], MD  traMADol (ULTRAM) 50 MG tablet Take 1 tablet (50 mg total) by mouth every 6 (six) hours as needed. 02/20/18   Mancel Bale, MD  venlafaxine (EFFEXOR) 75 MG tablet TAKE 1 TABLET BY MOUTH TWICE A DAY 01/12/18   Junie Spencer, FNP    Family History Family History  Problem Relation Age of Onset  . Hypertension Mother   . Hyperlipidemia Mother   . Heart disease Mother   . Heart disease Father        MI  . Alcohol abuse Father   . Alcohol abuse Brother   . Diabetes Maternal Grandmother   . Hyperlipidemia Maternal Grandmother     Social History Social History   Tobacco Use  . Smoking status: Current Every Day Smoker    Packs/day: 0.50    Years: 0.50    Pack years: 0.25    Types: Cigarettes    Last attempt to quit: 06/21/2012    Years since quitting: 5.9  . Smokeless tobacco: Never Used  Substance Use Topics  . Alcohol use: No  . Drug use: No     Allergies   Reglan [metoclopramide]; Cinnamon flavor; Codeine; Flomax [tamsulosin]; Ibuprofen; Levofloxacin; and Tylenol [acetaminophen]   Review of Systems Review of Systems  All other systems reviewed and are negative.    Physical Exam Updated Vital Signs BP (!) 165/90   Pulse (!) 108   Temp 97.6 F (36.4 C) (Oral)   Resp 20   Ht 1.753 m ( ) Comment: Simultaneous filing. User may not have seen previous data.  Wt 136.1 kg Comment: Simultaneous filing. User may not have seen previous data.  LMP 05/16/2018   SpO2 95%   BMI 44.30 kg/m   Physical Exam Vitals signs and nursing note reviewed.  Constitutional:      Appearance: She is well-developed.     Comments: Morbidly obese  HENT:     Head: Normocephalic and atraumatic.     Right Ear: External ear normal.     Left  Ear: External ear normal.     Mouth/Throat:     Pharynx: No oropharyngeal exudate or posterior oropharyngeal erythema.  Eyes:     General:  No scleral icterus.       Right eye: No discharge.        Left eye: No discharge.     Conjunctiva/sclera: Conjunctivae normal.  Neck:     Musculoskeletal: Neck supple.     Trachea: No tracheal deviation.  Cardiovascular:     Rate and Rhythm: Normal rate and regular rhythm.  Pulmonary:     Effort: Pulmonary effort is normal. No respiratory distress.     Breath sounds: Normal breath sounds. No stridor. No wheezing or rales.  Abdominal:     General: Bowel sounds are normal. There is no distension.     Palpations: Abdomen is soft.     Tenderness: There is no abdominal tenderness. There is no guarding or rebound.  Musculoskeletal:        General: No tenderness.  Skin:    General: Skin is warm and dry.     Findings: No rash.  Neurological:     Mental Status: She is alert.     Cranial Nerves: No cranial nerve deficit (no facial droop, extraocular movements intact, no slurred speech).     Sensory: No sensory deficit.     Motor: No abnormal muscle tone or seizure activity.     Coordination: Coordination normal.  Psychiatric:        Mood and Affect: Mood is anxious.      ED Treatments / Results  Labs (all labs ordered are listed, but only abnormal results are displayed) Labs Reviewed  CBC - Abnormal; Notable for the following components:      Result Value   WBC 11.4 (*)    All other components within normal limits  COMPREHENSIVE METABOLIC PANEL - Abnormal; Notable for the following components:   CO2 21 (*)    Glucose, Bld 100 (*)    Calcium 8.4 (*)    All other components within normal limits  URINALYSIS, ROUTINE W REFLEX MICROSCOPIC - Abnormal; Notable for the following components:   Color, Urine STRAW (*)    APPearance HAZY (*)    Leukocytes,Ua TRACE (*)    Bacteria, UA RARE (*)    All other components within normal limits   PREGNANCY, URINE     Procedures Procedures (including critical care time)  Medications Ordered in ED Medications  sodium chloride 0.9 % bolus 1,000 mL (1,000 mLs Intravenous New Bag/Given 05/23/18 1055)    Followed by  0.9 %  sodium chloride infusion (has no administration in time range)  ondansetron (ZOFRAN) injection 4 mg (4 mg Intravenous Given 05/23/18 1056)     Initial Impression / Assessment and Plan / ED Course  I have reviewed the triage vital signs and the nursing notes.  Pertinent labs & imaging results that were available during my care of the patient were reviewed by me and considered in my medical decision making (see chart for details).  Clinical Course as of May 23 1142  Wed May 23, 2018  1036 Patient contacted poison control and they gave recommendations regarding possible treatment for ethylene glycol overdose.  I did contact the [facility] and overall have a low suspicion that she actually has an ethylene glycol ingestion.  I cannot say for sure that the person she ate did not actually try to poison her.  This would be a matter to discuss with police.  We will certainly check for any signs of renal toxicity.  Plan on labs IV fluids and antiemetics   [JK]  1129 Labs reviewed.  Slight leukocytosis  but I doubt this is clinically significant.  Urinalysis not suggestive of infection.  Electrolyte panel does not show any evidence of an anion gap metabolic acidosis to suggest ethylene glycol poisoning.   [JK]    Clinical Course User Index [JK] Linwood Dibbles, MD     Laboratory tests are reassuring.  No clear history to suggest a poisoning, doubt any toxic ingestion. Is possible her symptoms are related to a viral illness.  Plan on discharge home with antiemetics and antacids.  Discussed warning signs and precautions.  Follow-up with her primary doctor. Final Clinical Impressions(s) / ED Diagnoses   Final diagnoses:  Diarrhea, unspecified type  Nausea    ED  Discharge Orders         Ordered    famotidine (PEPCID) 20 MG tablet  2 times daily     05/23/18 1142    ondansetron (ZOFRAN ODT) 8 MG disintegrating tablet  Every 8 hours PRN     05/23/18 1142           Linwood Dibbles, MD 05/23/18 1144

## 2018-05-23 NOTE — ED Notes (Signed)
Per poison control, if suspected pt drank antifreeze they recommend obtaining ethanol, methanol, ethylene glycol, cmp, and provide supportive care with antiemetics and iv fluid if needed.

## 2018-05-23 NOTE — ED Triage Notes (Signed)
Pt reports she went to a cook out Sunday and started having cough, sob, throat burning, metallic taste in mouth, and diarrhea after eating baked beans.  Pt says she looked her symptoms up and was concerned someone may have put antifreeze in the baked beans.  Pt says her friend is sick with similar symptoms.    Poison control called prior to patient's arrival and says that pt told them that she thinks she has been drinking antifreeze from a mountain dew bottle that was in her refridgerator.  Asked pt about it and pt says no she thinks it may have been in the baked beans.  Poison control gave recommendations for treatment and they were given to pcp.

## 2018-05-23 NOTE — Discharge Instructions (Signed)
Take the medications for nausea, you can also try taking Imodium for the diarrhea.  Return to the emergency room for fever or worsening symptoms.  Follow-up with your doctor if you are not feeling better in the next several days

## 2018-07-12 ENCOUNTER — Other Ambulatory Visit: Payer: Self-pay

## 2018-07-13 ENCOUNTER — Other Ambulatory Visit: Payer: Self-pay

## 2018-07-13 ENCOUNTER — Ambulatory Visit: Payer: Medicaid Other | Admitting: Family Medicine

## 2018-07-13 ENCOUNTER — Telehealth: Payer: Self-pay | Admitting: Family Medicine

## 2018-07-13 ENCOUNTER — Encounter: Payer: Self-pay | Admitting: Family Medicine

## 2018-07-13 VITALS — BP 142/101 | HR 87 | Temp 97.9°F | Ht 69.0 in | Wt 355.0 lb

## 2018-07-13 DIAGNOSIS — F39 Unspecified mood [affective] disorder: Secondary | ICD-10-CM | POA: Diagnosis not present

## 2018-07-13 DIAGNOSIS — R945 Abnormal results of liver function studies: Secondary | ICD-10-CM | POA: Diagnosis not present

## 2018-07-13 DIAGNOSIS — Z79899 Other long term (current) drug therapy: Secondary | ICD-10-CM | POA: Insufficient documentation

## 2018-07-13 DIAGNOSIS — G43019 Migraine without aura, intractable, without status migrainosus: Secondary | ICD-10-CM | POA: Insufficient documentation

## 2018-07-13 DIAGNOSIS — R7989 Other specified abnormal findings of blood chemistry: Secondary | ICD-10-CM

## 2018-07-13 DIAGNOSIS — E782 Mixed hyperlipidemia: Secondary | ICD-10-CM

## 2018-07-13 DIAGNOSIS — M545 Low back pain, unspecified: Secondary | ICD-10-CM

## 2018-07-13 DIAGNOSIS — K219 Gastro-esophageal reflux disease without esophagitis: Secondary | ICD-10-CM | POA: Insufficient documentation

## 2018-07-13 DIAGNOSIS — R030 Elevated blood-pressure reading, without diagnosis of hypertension: Secondary | ICD-10-CM

## 2018-07-13 DIAGNOSIS — R319 Hematuria, unspecified: Secondary | ICD-10-CM

## 2018-07-13 MED ORDER — TRAZODONE HCL 50 MG PO TABS: 50.0000 mg | ORAL_TABLET | Freq: Every evening | ORAL | 0 refills | Status: DC | PRN

## 2018-07-13 MED ORDER — NAPROXEN 500 MG PO TABS: 500.0000 mg | ORAL_TABLET | Freq: Two times a day (BID) | ORAL | 1 refills | Status: DC

## 2018-07-13 MED ORDER — OMEPRAZOLE 40 MG PO CPDR: DELAYED_RELEASE_CAPSULE | ORAL | 0 refills | Status: DC

## 2018-07-13 MED ORDER — OLANZAPINE 10 MG PO TABS: 10.0000 mg | ORAL_TABLET | Freq: Every day | ORAL | 0 refills | Status: DC

## 2018-07-13 MED ORDER — FLUOXETINE HCL 20 MG PO CAPS: 20.0000 mg | ORAL_CAPSULE | Freq: Every day | ORAL | 0 refills | Status: DC

## 2018-07-13 MED ORDER — HYDROXYZINE PAMOATE 25 MG PO CAPS: 25.0000 mg | ORAL_CAPSULE | Freq: Three times a day (TID) | ORAL | 0 refills | Status: AC | PRN

## 2018-07-13 MED ORDER — GABAPENTIN 300 MG PO CAPS: 300.0000 mg | ORAL_CAPSULE | Freq: Three times a day (TID) | ORAL | 0 refills | Status: DC

## 2018-07-13 NOTE — Patient Instructions (Signed)
DASH Eating Plan  DASH stands for "Dietary Approaches to Stop Hypertension." The DASH eating plan is a healthy eating plan that has been shown to reduce high blood pressure (hypertension). It may also reduce your risk for type 2 diabetes, heart disease, and stroke. The DASH eating plan may also help with weight loss.  What are tips for following this plan?    General guidelines   Avoid eating more than 2,300 mg (milligrams) of salt (sodium) a day. If you have hypertension, you may need to reduce your sodium intake to 1,500 mg a day.   Limit alcohol intake to no more than 1 drink a day for nonpregnant women and 2 drinks a day for men. One drink equals 12 oz of beer, 5 oz of wine, or 1 oz of hard liquor.   Work with your health care provider to maintain a healthy body weight or to lose weight. Ask what an ideal weight is for you.   Get at least 30 minutes of exercise that causes your heart to beat faster (aerobic exercise) most days of the week. Activities may include walking, swimming, or biking.   Work with your health care provider or diet and nutrition specialist (dietitian) to adjust your eating plan to your individual calorie needs.  Reading food labels     Check food labels for the amount of sodium per serving. Choose foods with less than 5 percent of the Daily Value of sodium. Generally, foods with less than 300 mg of sodium per serving fit into this eating plan.   To find whole grains, look for the word "whole" as the first word in the ingredient list.  Shopping   Buy products labeled as "low-sodium" or "no salt added."   Buy fresh foods. Avoid canned foods and premade or frozen meals.  Cooking   Avoid adding salt when cooking. Use salt-free seasonings or herbs instead of table salt or sea salt. Check with your health care provider or pharmacist before using salt substitutes.   Do not fry foods. Cook foods using healthy methods such as baking, boiling, grilling, and broiling instead.   Cook with  heart-healthy oils, such as olive, canola, soybean, or sunflower oil.  Meal planning   Eat a balanced diet that includes:  ? 5 or more servings of fruits and vegetables each day. At each meal, try to fill half of your plate with fruits and vegetables.  ? Up to 6-8 servings of whole grains each day.  ? Less than 6 oz of lean meat, poultry, or fish each day. A 3-oz serving of meat is about the same size as a deck of cards. One egg equals 1 oz.  ? 2 servings of low-fat dairy each day.  ? A serving of nuts, seeds, or beans 5 times each week.  ? Heart-healthy fats. Healthy fats called Omega-3 fatty acids are found in foods such as flaxseeds and coldwater fish, like sardines, salmon, and mackerel.   Limit how much you eat of the following:  ? Canned or prepackaged foods.  ? Food that is high in trans fat, such as fried foods.  ? Food that is high in saturated fat, such as fatty meat.  ? Sweets, desserts, sugary drinks, and other foods with added sugar.  ? Full-fat dairy products.   Do not salt foods before eating.   Try to eat at least 2 vegetarian meals each week.   Eat more home-cooked food and less restaurant, buffet, and fast food.     When eating at a restaurant, ask that your food be prepared with less salt or no salt, if possible.  What foods are recommended?  The items listed may not be a complete list. Talk with your dietitian about what dietary choices are best for you.  Grains  Whole-grain or whole-wheat bread. Whole-grain or whole-wheat pasta. Brown rice. Oatmeal. Quinoa. Bulgur. Whole-grain and low-sodium cereals. Pita bread. Low-fat, low-sodium crackers. Whole-wheat flour tortillas.  Vegetables  Fresh or frozen vegetables (raw, steamed, roasted, or grilled). Low-sodium or reduced-sodium tomato and vegetable juice. Low-sodium or reduced-sodium tomato sauce and tomato paste. Low-sodium or reduced-sodium canned vegetables.  Fruits  All fresh, dried, or frozen fruit. Canned fruit in natural juice (without  added sugar).  Meat and other protein foods  Skinless chicken or turkey. Ground chicken or turkey. Pork with fat trimmed off. Fish and seafood. Egg whites. Dried beans, peas, or lentils. Unsalted nuts, nut butters, and seeds. Unsalted canned beans. Lean cuts of beef with fat trimmed off. Low-sodium, lean deli meat.  Dairy  Low-fat (1%) or fat-free (skim) milk. Fat-free, low-fat, or reduced-fat cheeses. Nonfat, low-sodium ricotta or cottage cheese. Low-fat or nonfat yogurt. Low-fat, low-sodium cheese.  Fats and oils  Soft margarine without trans fats. Vegetable oil. Low-fat, reduced-fat, or light mayonnaise and salad dressings (reduced-sodium). Canola, safflower, olive, soybean, and sunflower oils. Avocado.  Seasoning and other foods  Herbs. Spices. Seasoning mixes without salt. Unsalted popcorn and pretzels. Fat-free sweets.  What foods are not recommended?  The items listed may not be a complete list. Talk with your dietitian about what dietary choices are best for you.  Grains  Baked goods made with fat, such as croissants, muffins, or some breads. Dry pasta or rice meal packs.  Vegetables  Creamed or fried vegetables. Vegetables in a cheese sauce. Regular canned vegetables (not low-sodium or reduced-sodium). Regular canned tomato sauce and paste (not low-sodium or reduced-sodium). Regular tomato and vegetable juice (not low-sodium or reduced-sodium). Pickles. Olives.  Fruits  Canned fruit in a light or heavy syrup. Fried fruit. Fruit in cream or butter sauce.  Meat and other protein foods  Fatty cuts of meat. Ribs. Fried meat. Bacon. Sausage. Bologna and other processed lunch meats. Salami. Fatback. Hotdogs. Bratwurst. Salted nuts and seeds. Canned beans with added salt. Canned or smoked fish. Whole eggs or egg yolks. Chicken or turkey with skin.  Dairy  Whole or 2% milk, cream, and half-and-half. Whole or full-fat cream cheese. Whole-fat or sweetened yogurt. Full-fat cheese. Nondairy creamers. Whipped toppings.  Processed cheese and cheese spreads.  Fats and oils  Butter. Stick margarine. Lard. Shortening. Ghee. Bacon fat. Tropical oils, such as coconut, palm kernel, or palm oil.  Seasoning and other foods  Salted popcorn and pretzels. Onion salt, garlic salt, seasoned salt, table salt, and sea salt. Worcestershire sauce. Tartar sauce. Barbecue sauce. Teriyaki sauce. Soy sauce, including reduced-sodium. Steak sauce. Canned and packaged gravies. Fish sauce. Oyster sauce. Cocktail sauce. Horseradish that you find on the shelf. Ketchup. Mustard. Meat flavorings and tenderizers. Bouillon cubes. Hot sauce and Tabasco sauce. Premade or packaged marinades. Premade or packaged taco seasonings. Relishes. Regular salad dressings.  Where to find more information:   National Heart, [employer], and [employer] Institute: www.nhlbi.nih.gov   American Heart Association: www.heart.org  Summary   The DASH eating plan is a healthy eating plan that has been shown to reduce high blood pressure (hypertension). It may also reduce your risk for type 2 diabetes, heart disease, and stroke.   With the   DASH eating plan, you should limit salt (sodium) intake to 2,300 mg a day. If you have hypertension, you may need to reduce your sodium intake to 1,500 mg a day.   When on the DASH eating plan, aim to eat more fresh fruits and vegetables, whole grains, lean proteins, low-fat dairy, and heart-healthy fats.   Work with your health care provider or diet and nutrition specialist (dietitian) to adjust your eating plan to your individual calorie needs.  This information is not intended to replace advice given to you by your health care provider. Make sure you discuss any questions you have with your health care provider.  Document Released: 01/20/2011 Document Revised: 01/25/2016 Document Reviewed: 01/25/2016  [employer] Interactive Patient Education  2019 [employer]

## 2018-07-13 NOTE — Progress Notes (Signed)
Subjective:  Patient ID: Denzil Magnuson, female    DOB: 1985/11/09, 33 y.o.   MRN: [id number]  Chief Complaint:  Medical Management of Chronic Issues (refills) and Depression   HPI: KALEIGHA CHAMBERLIN is a 33 y.o. female presenting on 07/13/2018 for Medical Management of Chronic Issues (refills) and Depression   1. Mood disorder ([place])  Pt states she was admitted to [facility] for a manic episode. Pt states they changed her medications and were providing her with her medications until one month ago. States she was supposed to follow up with [facility] but has been unable to due to COVID-19. Pt states she needs to get back on her medications because she feels she is on the verge of another manic episode. She states she is not compulsive, homicidal, or suicidal. States she just feels uneasy and does not want to be admitted to the hospital again. She has not been to a psychiatrist since discharge from [facility].    2. Obesity, morbid, BMI 50 or higher ([place])  Does not exercise or watch diet.   3. Elevated liver function tests  Pt has had elevated liver enzymes in the past. Pt is on Zyprexa which can cause this elevation. No abdominal pain or icterus. No pruritis or confusion.    4. GERD without esophagitis  Does well with omeprazole. No breakthrough symptoms. No sore throat, cough, voice change, hemoptysis, dysphagia, or changes in stool color.    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 States she has been on Gabapentin for this pain for a long time. States it was initially prescribed by her urologist. She does not have a follow up appointment scheduled due to COVID-19.   6. Intractable migraine without aura and without status migrainosus  Was taking abortive  triptans. States these medications made her chest hurt and her heart race. States she has tried over the counter tylenol and Excedrin without relief of the headaches. She denies aura. She does not  have nausea or vomiting. Does have photophobia and phonophobia with the headaches. States she will have a headache every week or so.      Relevant past medical, surgical, family, and social history reviewed and updated as indicated.  Allergies and medications reviewed and updated.   Past Medical History:  Diagnosis Date  . Chronic left flank pain   . Chronic pain   . Congenital obstruction of ureteropelvic junction (UPJ)   . 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 Fatty liver   . GERD (gastroesophageal reflux disease)   . History of kidney stones   . History of recurrent UTIs   . Hypertension   . Kidney disease   . Loin pain hematuria syndrome    Dr. Amalia Hailey at [facility]  . Morbid obesity [facility])   . Preterm labor   . Round ligament pain 09/10/2012  . Torn ACL     Past Surgical History:  Procedure Laterality Date  . addenoidectomy    . CESAREAN SECTION N/A 02/22/2013   Procedure: CESAREAN SECTION/TWINS;  Surgeon: Florian Buff, MD;  Location: [facility];  Service: Obstetrics;  Laterality: N/A;  . CHOLECYSTECTOMY    . GALLBLADDER SURGERY  2019  . NASAL SINUS SURGERY    . NEPHRECTOMY     right side  . OTHER SURGICAL HISTORY     ulner nerve removal  . TONSILLECTOMY      Social History   Socioeconomic  History  . Marital status: Married    Spouse name: Not on file  . Number of children: Not on file  . Years of education: Not on file  . Highest education level: Not on file  Occupational History  . Not on file  Social Needs  . Financial resource strain: Not on file  . Food insecurity:    Worry: Not on file    Inability: Not on file  . Transportation needs:    Medical: Not on file    Non-medical: Not on file  Tobacco Use  . Smoking status: Current Every Day Smoker    Packs/day: 0.50    Years: 0.50    Pack years: 0.25    Types: Cigarettes    Last attempt to quit: 06/21/2012    Years since quitting: 6.0  . Smokeless tobacco: Never Used  Substance and  Sexual Activity  . Alcohol use: No  . Drug use: No  . Sexual activity: Yes    Partners: Male    Birth control/protection: None  Lifestyle  . Physical activity:    Days per week: Not on file    Minutes per session: Not on file  . Stress: Not on file  Relationships  . Social connections:    Talks on phone: Not on file    Gets together: Not on file    Attends religious service: Not on file    Active member of club or organization: Not on file    Attends meetings of clubs or organizations: Not on file    Relationship status: Not on file  . Intimate partner violence:    Fear of current or ex partner: Not on file    Emotionally abused: Not on file    Physically abused: Not on file    Forced sexual activity: Not on file  Other Topics Concern  . Not on file  Social History Narrative  . Not on file    Outpatient Encounter Medications as of 07/13/2018  Medication Sig  . FLUoxetine (PROZAC) 20 MG capsule Take 1 capsule (20 mg total) by mouth daily for 30 days.  Marland Kitchen gabapentin (NEURONTIN) 300 MG capsule Take 1 capsule (300 mg total) by mouth 3 (three) times daily for 30 days. Patient says 500 mg , TID  . hydrOXYzine (VISTARIL) 25 MG capsule Take 1 capsule (25 mg total) by mouth every 8 (eight) hours as needed for up to 30 days.  Marland Kitchen OLANZapine (ZYPREXA) 10 MG tablet Take 1 tablet (10 mg total) by mouth at bedtime for 30 days.  Marland Kitchen omeprazole (PRILOSEC) 40 MG capsule TAKE 1 CAPSULE BY MOUTH EVERY DAY  . traZODone (DESYREL) 50 MG tablet Take 1 tablet (50 mg total) by mouth at bedtime as needed for up to 30 days.  . [DISCONTINUED] FLUoxetine (PROZAC) 20 MG capsule Take 20 mg by mouth daily.  . [DISCONTINUED] gabapentin (NEURONTIN) 300 MG capsule Take 300 mg by mouth 3 (three) times daily. Patient says 500 mg , TID  . [DISCONTINUED] hydrOXYzine (VISTARIL) 25 MG capsule TAKE 1 CAPSULE BY MOUTH EVERY 8 HOURS AS NEEDED  . [DISCONTINUED] OLANZapine (ZYPREXA) 10 MG tablet Take 10 mg by mouth at bedtime.   . [DISCONTINUED] omeprazole (PRILOSEC) 40 MG capsule TAKE 1 CAPSULE BY MOUTH EVERY DAY  . [DISCONTINUED] traZODone (DESYREL) 50 MG tablet Take 50 mg by mouth at bedtime as needed.  . naproxen (NAPROSYN) 500 MG tablet Take 1 tablet (500 mg total) by mouth 2 (two) times daily with a meal.  . [  DISCONTINUED] amoxicillin-clavulanate (AUGMENTIN) 875-125 MG tablet Take 1 tablet by mouth 2 (two) times daily.  . [DISCONTINUED] cephALEXin (KEFLEX) 500 MG capsule Take 1 capsule (500 mg total) by mouth 4 (four) times daily. For 7 days  . [DISCONTINUED] diphenoxylate-atropine (LOMOTIL) 2.5-0.025 MG tablet Take 1 tablet by mouth 4 (four) times daily as needed for diarrhea or loose stools.  . [DISCONTINUED] famotidine (PEPCID) 20 MG tablet Take 1 tablet (20 mg total) by mouth 2 (two) times daily.  . [DISCONTINUED] ondansetron (ZOFRAN ODT) 8 MG disintegrating tablet Take 1 tablet (8 mg total) by mouth every 8 (eight) hours as needed for nausea or vomiting.  . [DISCONTINUED] ondansetron (ZOFRAN) 4 MG tablet Take 1 tablet (4 mg total) by mouth every 6 (six) hours. As needed for nausea  . [DISCONTINUED] SUMAtriptan (IMITREX) 100 MG tablet TAKE 1/2 TO 1 TABLET FOR HEADACHE-MAY REPEAT IN 2 HRS IF PRESISTS OR RECURS-NO MORE THAN 2 IN 24 HRS (Patient not taking: Reported on 07/13/2018)  . [DISCONTINUED] traMADol (ULTRAM) 50 MG tablet Take 1 tablet (50 mg total) by mouth every 6 (six) hours as needed.  . [DISCONTINUED] venlafaxine (EFFEXOR) 75 MG tablet TAKE 1 TABLET BY MOUTH TWICE A DAY   No facility-administered encounter medications on file as of 07/13/2018.     Allergies  Allergen Reactions  . Cinnamon Anaphylaxis    Cinnamon flavoring   . Other Swelling    Artificial cinnamon causes tongue to swell  . Reglan [Metoclopramide] Other (See Comments)    Tactile disturbances REACTION: causes skin to "crawl"  . Cinnamon Flavor Swelling    Artificial cinnamon causes tongue to swell  . Codeine Nausea Only     Straight codeine only Percocet & vicodin are tolerated by pt.  . Flomax [Tamsulosin] Other (See Comments)    Other reaction(s): Other (See Comments) Oral sores Oral sores  . Ibuprofen Other (See Comments)    Stomach pain Causes severe pain in stomach Pt can tolerate if used sparingly  . Levofloxacin Hives  . Tylenol [Acetaminophen] Other (See Comments)    Pt reports Tylenol causes her kidney to bleed.    Review of Systems  Constitutional: Negative for chills, fatigue and fever.  HENT: Negative for sore throat, trouble swallowing and voice change.   Eyes: Negative for photophobia and visual disturbance.  Respiratory: Negative for cough, chest tightness, shortness of breath and wheezing.   Cardiovascular: Negative for chest pain, palpitations and leg swelling.  Gastrointestinal: Negative for abdominal pain, anal bleeding, blood in stool, constipation, diarrhea, nausea and vomiting.  Endocrine: Negative for cold intolerance, heat intolerance, polydipsia, polyphagia and polyuria.  Genitourinary: Positive for flank pain (chronic). Negative for decreased urine volume, difficulty urinating and hematuria.  Musculoskeletal: Negative for arthralgias, back pain and joint swelling.  Skin: Negative for color change, pallor and rash.  Neurological: Negative for dizziness, tremors, seizures, syncope, facial asymmetry, speech difficulty, weakness, light-headedness, numbness and headaches.  Hematological: Does not bruise/bleed easily.  Psychiatric/Behavioral: Positive for agitation, behavioral problems, decreased concentration, dysphoric mood and sleep disturbance. Negative for confusion, hallucinations, self-injury and suicidal ideas. The patient is nervous/anxious. The patient is not hyperactive.   All other systems reviewed and are negative.       Objective:  BP (!) 142/101   Pulse 87   Temp 97.9 F (36.6 C) (Oral)   Ht '5\' 9"'$  (1.753 m)   Wt (!) 355 lb (161 kg)   BMI 52.42 kg/m    Wt  Readings from Last 3 Encounters:  07/13/18 Marland Kitchen)  355 lb (161 kg)  05/23/18 300 lb (136.1 kg)  02/20/18 (!) 320 lb (145.2 kg)    Physical Exam Vitals signs and nursing note reviewed.  Constitutional:      General: She is not in acute distress.    Appearance: Normal appearance. She is well-developed and well-groomed. She is morbidly obese. She is not ill-appearing, toxic-appearing or diaphoretic.  HENT:     Head: Normocephalic and atraumatic.     Jaw: There is normal jaw occlusion.     Right Ear: Hearing normal.     Left Ear: Hearing normal.     Nose: Nose normal.     Mouth/Throat:     Lips: Pink.     Mouth: Mucous membranes are moist.     Pharynx: Oropharynx is clear. Uvula midline.  Eyes:     General: Lids are normal.     Extraocular Movements: Extraocular movements intact.     Conjunctiva/sclera: Conjunctivae normal.     Pupils: Pupils are equal, round, and reactive to light.  Neck:     Musculoskeletal: Normal range of motion and neck supple.     Thyroid: No thyroid mass, thyromegaly or thyroid tenderness.     Trachea: Trachea and phonation normal.  Cardiovascular:     Rate and Rhythm: Normal rate and regular rhythm.  No extrasystoles are present.    Pulses: Normal pulses.     Heart sounds: Normal heart sounds. No murmur. No friction rub. No gallop.   Pulmonary:     Effort: No respiratory distress.     Breath sounds: Normal breath sounds.  Abdominal:     General: Abdomen is protuberant. Bowel sounds are normal.     Palpations: Abdomen is soft.     Tenderness: There is no abdominal tenderness.  Skin:    General: Skin is warm and dry.     Capillary Refill: Capillary refill takes less than 2 seconds.     Coloration: Skin is not jaundiced.     Findings: No rash.  Neurological:     General: No focal deficit present.     Mental Status: She is alert and oriented to person, place, and time.     Cranial Nerves: No cranial nerve deficit.     Sensory: No sensory deficit.      Motor: No weakness.     Coordination: Coordination normal.     Gait: Gait normal.     Deep Tendon Reflexes: Reflexes normal.  Psychiatric:        Attention and Perception: Attention and perception normal.        Mood and Affect: Mood and affect normal.        Speech: Speech normal.        Behavior: Behavior normal. Behavior is cooperative.        Thought Content: Thought content normal.        Cognition and Memory: Cognition and memory normal.        Judgment: Judgment normal.     Results for orders placed or performed during the hospital encounter of 05/23/18  CBC  Result Value Ref Range   WBC 11.4 (H) 4.0 - 10.5 K/uL   RBC 4.49 3.87 - 5.11 MIL/uL   Hemoglobin 13.5 12.0 - 15.0 g/dL   HCT 42.4 36.0 - 46.0 %   MCV 94.4 80.0 - 100.0 fL   MCH 30.1 26.0 - 34.0 pg   MCHC 31.8 30.0 - 36.0 g/dL   RDW 13.6 11.5 - 15.5 %   Platelets  328 150 - 400 K/uL   nRBC 0.0 0.0 - 0.2 %  Comprehensive metabolic panel  Result Value Ref Range   Sodium 137 135 - 145 mmol/L   Potassium 3.9 3.5 - 5.1 mmol/L   Chloride 107 98 - 111 mmol/L   CO2 21 (L) 22 - 32 mmol/L   Glucose, Bld 100 (H) 70 - 99 mg/dL   BUN 9 6 - 20 mg/dL   Creatinine, Ser 0.63 0.44 - 1.00 mg/dL   Calcium 8.4 (L) 8.9 - 10.3 mg/dL   Total Protein 7.0 6.5 - 8.1 g/dL   Albumin 3.7 3.5 - 5.0 g/dL   AST 21 15 - 41 U/L   ALT 28 0 - 44 U/L   Alkaline Phosphatase 88 38 - 126 U/L   Total Bilirubin 0.4 0.3 - 1.2 mg/dL   GFR calc non Af Amer >60 >60 mL/min   GFR calc Af Amer >60 >60 mL/min   Anion gap 9 5 - 15  Urinalysis, Routine w reflex microscopic  Result Value Ref Range   Color, Urine STRAW (A) YELLOW   APPearance HAZY (A) CLEAR   Specific Gravity, Urine 1.008 1.005 - 1.030   pH 6.0 5.0 - 8.0   Glucose, UA NEGATIVE NEGATIVE mg/dL   Hgb urine dipstick NEGATIVE NEGATIVE   Bilirubin Urine NEGATIVE NEGATIVE   Ketones, ur NEGATIVE NEGATIVE mg/dL   Protein, ur NEGATIVE NEGATIVE mg/dL   Nitrite NEGATIVE NEGATIVE   Leukocytes,Ua  TRACE (A) NEGATIVE   RBC / HPF 0-5 0 - 5 RBC/hpf   WBC, UA 6-10 0 - 5 WBC/hpf   Bacteria, UA RARE (A) NONE SEEN   Squamous Epithelial / LPF 6-10 0 - 5   Mucus PRESENT   Pregnancy, urine  Result Value Ref Range   Preg Test, Ur NEGATIVE NEGATIVE     EKG sinus rhythm without ectopy or ST abnormalities. Normal QT interval - Monia Pouch, FNP-C  Pertinent labs & imaging results that were available during my care of the patient were reviewed by me and considered in my medical decision making.  Assessment & Plan:  [place] was seen today for medical management of chronic issues and depression.  Diagnoses and all orders for this visit:  Mood disorder ([place]) Pt has been out of medications for 30 days. She has not been able to get in with [facility] due to COVID-19. Will place urgent referral to psychiatry for evaluation and management. Will provide 1 month supply of medications until pt can be seen by psychiatry.  -     FLUoxetine (PROZAC) 20 MG capsule; Take 1 capsule (20 mg total) by mouth daily for 30 days. -     OLANZapine (ZYPREXA) 10 MG tablet; Take 1 tablet (10 mg total) by mouth at bedtime for 30 days. -     traZODone (DESYREL) 50 MG tablet; Take 1 tablet (50 mg total) by mouth at bedtime as needed for up to 30 days. -     hydrOXYzine (VISTARIL) 25 MG capsule; Take 1 capsule (25 mg total) by mouth every 8 (eight) hours as needed for up to 30 days. -     ToxASSURE Select 13 (MW), Urine -     Ambulatory referral to Psychiatry  Obesity, morbid, BMI 50 or higher ([place]) Diet and exercise encouraged. Labs pending.  -     CBC with Differential/Platelet -     Lipid panel -     Thyroid Panel With TSH  Elevated liver function tests -  CMP14+EGFR  GERD without esophagitis Well controlled with current regimen. Avoid triggers. Medications as prescribed.  -     omeprazole (PRILOSEC) 40 MG capsule; TAKE 1 CAPSULE BY MOUTH EVERY DAY -     CBC with Differential/Platelet  Loin pain hematuria  syndrome Chronic pain from single kidney. Will continue below. Labs pending.  -     gabapentin (NEURONTIN) 300 MG capsule; Take 1 capsule (300 mg total) by mouth 3 (three) times daily for 30 days. Patient says 500 mg , TID -     CBC with Differential/Platelet -     Microalbumin / creatinine urine ratio  Elevated blood pressure reading in office without diagnosis of hypertension DASH diet discussed. Will recheck in 1 month. Keep log of BP at home. Report any persistent high readings.  -     CBC with Differential/Platelet -     Microalbumin / creatinine urine ratio  Intractable migraine without aura and without status migrainosus Pt stopped triptans. Pt to keep headache diary. Will trial naproxen for abortive therapy. Report any new or worsening symptoms.  -     naproxen (NAPROSYN) 500 MG tablet; Take 1 tablet (500 mg total) by mouth 2 (two) times daily with a meal.  High risk medication use EKG without prolonged QT interval. Labs pending. Urgent referral to psychiatry for evaluation and management. Will provide 1 month of medications so pt can get established with psychiatry.  -     CMP14+EGFR -     Lipid panel -     ToxASSURE Select 13 (MW), Urine -     EKG; Future -     Ambulatory referral to Psychiatry -     EKG 12-Lead     Continue all other maintenance medications.   Total time spent with patient 50 minutes.  Greater than 50% of encounter spent in coordination of care/counseling.  Follow up plan: Return in about 1 month (around 08/13/2018), or if symptoms worsen or fail to improve, for BP.  Educational handout given for DASH diet  The above assessment and management plan was discussed with the patient. The patient verbalized understanding of and has agreed to the management plan. Patient is aware to call the clinic if symptoms persist or worsen. Patient is aware when to return to the clinic for a follow-up visit. Patient educated on when it is appropriate to go to the emergency  department.   Monia Pouch, FNP-C [facility] [phone]

## 2018-07-14 LAB — CBC WITH DIFFERENTIAL/PLATELET
Basophils Absolute: 0.1 10*3/uL (ref 0.0–0.2)
Basos: 1 %
EOS (ABSOLUTE): 0.6 10*3/uL — ABNORMAL HIGH (ref 0.0–0.4)
Eos: 6 %
Hematocrit: 41.4 % (ref 34.0–46.6)
Hemoglobin: 13.2 g/dL (ref 11.1–15.9)
Immature Grans (Abs): 0 10*3/uL (ref 0.0–0.1)
Immature Granulocytes: 0 %
Lymphocytes Absolute: 2.3 10*3/uL (ref 0.7–3.1)
Lymphs: 23 %
MCH: 31.5 pg (ref 26.6–33.0)
MCHC: 31.9 g/dL (ref 31.5–35.7)
MCV: 99 fL — ABNORMAL HIGH (ref 79–97)
Monocytes Absolute: 0.8 10*3/uL (ref 0.1–0.9)
Monocytes: 8 %
Neutrophils Absolute: 6.3 10*3/uL (ref 1.4–7.0)
Neutrophils: 62 %
Platelets: 338 10*3/uL (ref 150–450)
RBC: 4.19 x10E6/uL (ref 3.77–5.28)
RDW: 14.2 % (ref 11.7–15.4)
WBC: 10 10*3/uL (ref 3.4–10.8)

## 2018-07-14 LAB — CMP14+EGFR
ALT: 13 IU/L (ref 0–32)
AST: 12 IU/L (ref 0–40)
Albumin/Globulin Ratio: 1.6 (ref 1.2–2.2)
Albumin: 3.7 g/dL — ABNORMAL LOW (ref 3.8–4.8)
Alkaline Phosphatase: 85 IU/L (ref 39–117)
BUN/Creatinine Ratio: 16 (ref 9–23)
BUN: 14 mg/dL (ref 6–20)
Bilirubin Total: <0.2 mg/dL (ref 0.0–1.2)
CO2: 20 mmol/L (ref 20–29)
Calcium: 8.7 mg/dL (ref 8.7–10.2)
Chloride: 110 mmol/L — ABNORMAL HIGH (ref 96–106)
Creatinine, Ser: 0.89 mg/dL (ref 0.57–1.00)
GFR calc Af Amer: 98 mL/min/{1.73_m2} (ref >59)
GFR calc non Af Amer: 85 mL/min/{1.73_m2} (ref >59)
Globulin, Total: 2.3 g/dL (ref 1.5–4.5)
Glucose: 66 mg/dL (ref 65–99)
Potassium: 4.9 mmol/L (ref 3.5–5.2)
Sodium: 143 mmol/L (ref 134–144)
Total Protein: 6 g/dL (ref 6.0–8.5)

## 2018-07-14 LAB — THYROID PANEL WITH TSH
Free Thyroxine Index: 1.5 (ref 1.2–4.9)
T3 Uptake Ratio: 23 % — ABNORMAL LOW (ref 24–39)
T4, Total: 6.7 ug/dL (ref 4.5–12.0)
TSH: 1.32 u[IU]/mL (ref 0.450–4.500)

## 2018-07-14 LAB — LIPID PANEL
Chol/HDL Ratio: 6.4 ratio — ABNORMAL HIGH (ref 0.0–4.4)
Cholesterol, Total: 245 mg/dL — ABNORMAL HIGH (ref 100–199)
HDL: 38 mg/dL — ABNORMAL LOW (ref >39)
LDL Calculated: 156 mg/dL — ABNORMAL HIGH (ref 0–99)
Triglycerides: 257 mg/dL — ABNORMAL HIGH (ref 0–149)
VLDL Cholesterol Cal: 51 mg/dL — ABNORMAL HIGH (ref 5–40)

## 2018-07-14 LAB — MICROALBUMIN / CREATININE URINE RATIO
Creatinine, Urine: 61.8 mg/dL
Microalb/Creat Ratio: <5 mg/g creat (ref 0–29)
Microalbumin, Urine: <3 ug/mL

## 2018-07-16 NOTE — Addendum Note (Signed)
Addended by: Sonny Masters on: 07/16/2018 11:03 AM   Modules accepted: Orders

## 2018-07-16 NOTE — Telephone Encounter (Signed)
Called automated records collection and let them know [facility]iox worked on records [place]

## 2018-07-19 LAB — TOXASSURE SELECT 13 (MW), URINE

## 2018-08-13 ENCOUNTER — Other Ambulatory Visit: Payer: Self-pay

## 2018-08-14 ENCOUNTER — Encounter: Payer: Self-pay | Admitting: Family Medicine

## 2018-08-14 ENCOUNTER — Ambulatory Visit (INDEPENDENT_AMBULATORY_CARE_PROVIDER_SITE_OTHER): Payer: Medicaid Other | Admitting: Family Medicine

## 2018-08-14 DIAGNOSIS — F39 Unspecified mood [affective] disorder: Secondary | ICD-10-CM

## 2018-08-14 DIAGNOSIS — R319 Hematuria, unspecified: Secondary | ICD-10-CM | POA: Diagnosis not present

## 2018-08-14 DIAGNOSIS — M545 Low back pain: Secondary | ICD-10-CM | POA: Diagnosis not present

## 2018-08-14 MED ORDER — OLANZAPINE 10 MG PO TABS: 10.0000 mg | ORAL_TABLET | Freq: Every day | ORAL | 0 refills | Status: DC

## 2018-08-14 MED ORDER — GABAPENTIN 300 MG PO CAPS: 300.0000 mg | ORAL_CAPSULE | Freq: Three times a day (TID) | ORAL | 0 refills | Status: DC

## 2018-08-14 MED ORDER — FLUOXETINE HCL 20 MG PO CAPS: 20.0000 mg | ORAL_CAPSULE | Freq: Every day | ORAL | 0 refills | Status: DC

## 2018-08-14 MED ORDER — TRAZODONE HCL 50 MG PO TABS: 50.0000 mg | ORAL_TABLET | Freq: Every evening | ORAL | 0 refills | Status: DC | PRN

## 2018-08-14 NOTE — Progress Notes (Signed)
Virtual Visit via telephone Note Due to COVID-19, visit is conducted virtually and was requested by patient. This visit type was conducted due to national recommendations for restrictions regarding the COVID-19 Pandemic (e.g. social distancing) in an effort to limit this patient's exposure and mitigate transmission in our community. All issues noted in this document were discussed and addressed.  A physical exam was not performed with this format.   I connected with Alice MediaBrittany L Face on 08/14/18 at 1100 by telephone and verified that I am speaking with the correct person using two identifiers. Alice MediaBrittany L Nieves is currently located at home and family is currently with them during visit. The provider, Kari BaarsMichelle Kalvin Buss, FNP is located in their office at time of visit.  I discussed the limitations, risks, security and privacy concerns of performing an evaluation and management service by telephone and the availability of in person appointments. I also discussed with the patient that there may be a patient responsible charge related to this service. The patient expressed understanding and agreed to proceed.  Subjective:  Patient ID: Alice [name], female    DOB: 1985-11-21, 33 y.o.   MRN: [id number]  Chief Complaint:  No chief complaint on file.   HPI: Alice MediaBrittany L Shukla is a 33 y.o. female presenting on 08/14/2018 for No chief complaint on file.   Pt following up today after reinitiating her medications. Pt was seen in office on 07/13/2018 for medication refills. Prior to this office visit pt was admitted to [facility] for a manic episode and they were managing her medications until 06/15/2018. She has been out of her medications for a month upon initial evaluation in office. Pt was afraid she would have another manic episode if she did not get her medications. Pt reports today that she is doing good on her medications. States she is compliant with them without associated side effects. No  mania, compulsive behavior, promiscuous behavior, SI, or HI. She states she has yet to see psychiatry. States she is working to get an appointment with behavioral health in [place]. States she is awaiting paperwork.  Pt states she continues to have flank pain. States she was previously taking 600 mg of gabapentin three times daily not 300 mg. States the 300 mg is not working and she continues to have the pain. She has not scheduled a follow up with urology as discussed.   Anxiety Presents for follow-up visit. Symptoms include decreased concentration, excessive worry, irritability and nervous/anxious behavior. Patient reports no chest pain, compulsions, confusion, depressed mood, dizziness, dry mouth, feeling of choking, hyperventilation, impotence, insomnia, malaise, muscle tension, nausea, obsessions, palpitations, panic, restlessness, shortness of breath or suicidal ideas. Symptoms occur occasionally. The severity of symptoms is mild. The quality of sleep is fair.   Her past medical history is significant for depression. Compliance with medications is 76-100%.  Depression      The patient presents with depression.  This is a chronic problem.  The problem occurs rarely.  Associated symptoms include decreased concentration, irritable and myalgias.  Associated symptoms include no fatigue, no helplessness, no hopelessness, does not have insomnia, no restlessness, no decreased interest, no appetite change, no body aches, no headaches, no indigestion, not sad and no suicidal ideas.     The symptoms are aggravated by work stress, social issues and family issues.  Past treatments include other medications and SSRIs - Selective serotonin reuptake inhibitors.  Compliance with treatment is good.  Previous treatment provided moderate relief.  Past medical history includes  chronic pain, chronic illness, anxiety, depression and mental health disorder.    GAD 7 : Generalized Anxiety Score 08/14/2018  Nervous,  Anxious, on Edge 1  Control/stop worrying 1  Worry too much - different things 1  Trouble relaxing 0  Restless 0  Easily annoyed or irritable 3  Afraid - awful might happen 1  Total GAD 7 Score 7    [facility] 08/14/2018 07/13/2018 07/14/2017 04/07/2017 03/17/2017  Decreased Interest 0 0 2 0 1  Down, Depressed, Hopeless 0 0 1 1 1   PHQ - 2 Score 0 0 3 1 2   Altered sleeping - - 3 - 3  Tired, decreased energy - - 2 - 2  Change in appetite - - 3 - 1  Feeling bad or failure about yourself  - - 1 - 1  Trouble concentrating - - 1 - 1  Moving slowly or fidgety/restless - - 0 - 0  Suicidal thoughts - - 0 - 0  PHQ-9 Score - - 13 - 10  Some recent data might be hidden     Relevant past medical, surgical, family, and social history reviewed and updated as indicated.  Allergies and medications reviewed and updated.   Past Medical History:  Diagnosis Date  . Chronic left flank pain   . Chronic pain   . Congenital obstruction of ureteropelvic junction (UPJ)   . 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 Fatty liver   . GERD (gastroesophageal reflux disease)   . History of kidney stones   . History of recurrent UTIs   . Hypertension   . Kidney disease   . Loin pain hematuria syndrome    [facility]  . Morbid obesity [facility](HCC)   . Preterm labor   . Round ligament pain 09/10/2012  . Torn ACL     Past Surgical History:  Procedure Laterality Date  . addenoidectomy    . CESAREAN SECTION N/A 02/22/2013   Procedure: CESAREAN SECTION/TWINS;  Surgeon: Lazaro ArmsLuther H Eure, MD;  Location: [facility];  Service: Obstetrics;  Laterality: N/A;  . CHOLECYSTECTOMY    . GALLBLADDER SURGERY  2019  . NASAL SINUS SURGERY    . NEPHRECTOMY     right side  . OTHER SURGICAL HISTORY     ulner nerve removal  . TONSILLECTOMY      Social History   Socioeconomic History  . Marital status: Married    Spouse name: Not on file  . Number of children: Not on file  . Years of  education: Not on file  . Highest education level: Not on file  Occupational History  . Not on file  Social Needs  . Financial resource strain: Not on file  . Food insecurity    Worry: Not on file    Inability: Not on file  . Transportation needs    Medical: Not on file    Non-medical: Not on file  Tobacco Use  . Smoking status: Current Every Day Smoker    Packs/day: 0.50    Years: 0.50    Pack years: 0.25    Types: Cigarettes    Last attempt to quit: 06/21/2012    Years since quitting: 6.1  . Smokeless tobacco: Never Used  Substance and Sexual Activity  . Alcohol use: No  . Drug use: No  . Sexual activity: Yes    Partners: Male    Birth control/protection: None  Lifestyle  . Physical activity    Days  per week: Not on file    Minutes per session: Not on file  . Stress: Not on file  Relationships  . Social Musicianconnections    Talks on phone: Not on file    Gets together: Not on file    Attends religious service: Not on file    Active member of club or organization: Not on file    Attends meetings of clubs or organizations: Not on file    Relationship status: Not on file  . Intimate partner violence    Fear of current or ex partner: Not on file    Emotionally abused: Not on file    Physically abused: Not on file    Forced sexual activity: Not on file  Other Topics Concern  . Not on file  Social History Narrative  . Not on file    Outpatient Encounter Medications as of 08/14/2018  Medication Sig  . FLUoxetine (PROZAC) 20 MG capsule Take 1 capsule (20 mg total) by mouth daily.  Marland Kitchen. gabapentin (NEURONTIN) 300 MG capsule Take 1 capsule (300 mg total) by mouth 3 (three) times daily. Patient says 500 mg , TID  . naproxen (NAPROSYN) 500 MG tablet Take 1 tablet (500 mg total) by mouth 2 (two) times daily with a meal.  . OLANZapine (ZYPREXA) 10 MG tablet Take 1 tablet (10 mg total) by mouth at bedtime.  Marland Kitchen. omeprazole (PRILOSEC) 40 MG capsule TAKE 1 CAPSULE BY MOUTH EVERY DAY  .  traZODone (DESYREL) 50 MG tablet Take 1 tablet (50 mg total) by mouth at bedtime as needed.  . [DISCONTINUED] FLUoxetine (PROZAC) 20 MG capsule Take 1 capsule (20 mg total) by mouth daily for 30 days.  . [DISCONTINUED] gabapentin (NEURONTIN) 300 MG capsule Take 1 capsule (300 mg total) by mouth 3 (three) times daily for 30 days. Patient says 500 mg , TID  . [DISCONTINUED] OLANZapine (ZYPREXA) 10 MG tablet Take 1 tablet (10 mg total) by mouth at bedtime for 30 days.  . [DISCONTINUED] traZODone (DESYREL) 50 MG tablet Take 1 tablet (50 mg total) by mouth at bedtime as needed for up to 30 days.   No facility-administered encounter medications on file as of 08/14/2018.     Allergies  Allergen Reactions  . Cinnamon Anaphylaxis    Cinnamon flavoring   . Other Swelling    Artificial cinnamon causes tongue to swell  . Reglan [Metoclopramide] Other (See Comments)    Tactile disturbances REACTION: causes skin to "crawl"  . Cinnamon Flavor Swelling    Artificial cinnamon causes tongue to swell  . Codeine Nausea Only    Straight codeine only Percocet & vicodin are tolerated by pt.  . Flomax [Tamsulosin] Other (See Comments)    Other reaction(s): Other (See Comments) Oral sores Oral sores  . Ibuprofen Other (See Comments)    Stomach pain Causes severe pain in stomach Pt can tolerate if used sparingly  . Levofloxacin Hives  . Tylenol [Acetaminophen] Other (See Comments)    Pt reports Tylenol causes her kidney to bleed.    Review of Systems  Constitutional: Positive for irritability. Negative for appetite change and fatigue.  Respiratory: Negative for shortness of breath.   Cardiovascular: Negative for chest pain and palpitations.  Gastrointestinal: Negative for nausea.  Genitourinary: Negative for impotence.  Musculoskeletal: Positive for myalgias.  Neurological: Negative for dizziness and headaches.  Psychiatric/Behavioral: Positive for decreased concentration and depression. Negative  for confusion and suicidal ideas. The patient is nervous/anxious. The patient does not have insomnia.  Observations/Objective: No vital signs or physical exam, this was a telephone or virtual health encounter.  Pt alert and oriented, answers all questions appropriately, and able to speak in full sentences.    Assessment and Plan: Diagnoses and all orders for this visit:  Mood disorder ([place]) Pt has not received an appointment to see psychiatry. States they are working to get this scheduled. Pt is doing well on current medications. Compliant and tolerating well per report. Will provide one more month until pt is able to see psychiatry. Report any new or worsening symptoms. Medications as prescribed.  -     FLUoxetine (PROZAC) 20 MG capsule; Take 1 capsule (20 mg total) by mouth daily. -     OLANZapine (ZYPREXA) 10 MG tablet; Take 1 tablet (10 mg total) by mouth at bedtime. -     traZODone (DESYREL) 50 MG tablet; Take 1 tablet (50 mg total) by mouth at bedtime as needed.  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 States she was taking 600 mg of gabapentin three times daily. States this was prescribed by her urologist at [facility]. Discussion about appropriate dosing of gabapentin with pt. Aware she was at max dose. Previous records available in Epic do not indicate a dose of 600 mg three times daily. Provider will reach out to urology for collaboration in pts care. Pt aware to continue current dose of gabapentin. Dangers of taking unprescribed amounts discussed with pt.  -     gabapentin (NEURONTIN) 300 MG capsule; Take 1 capsule (300 mg total) by mouth 3 (three) times daily. Patient says 500 mg , TID     Follow Up Instructions: Return in about 4 weeks (around 09/11/2018), or if symptoms worsen or fail to improve.    I discussed the assessment and treatment plan with the patient. The patient was provided an opportunity to ask questions and all were answered. The  patient agreed with the plan and demonstrated an understanding of the instructions.   The patient was advised to call back or seek an in-person evaluation if the symptoms worsen or if the condition fails to improve as anticipated.  The above assessment and management plan was discussed with the patient. The patient verbalized understanding of and has agreed to the management plan. Patient is aware to call the clinic if symptoms persist or worsen. Patient is aware when to return to the clinic for a follow-up visit. Patient educated on when it is appropriate to go to the emergency department.    I provided 30 minutes of non-face-to-face time during this encounter. The call started at 1100. The call ended at 1130. The other time was used for coordination of care.    Monia Pouch, FNP-C [facility] [address] [place], [place] [address] [phone]

## 2018-08-20 ENCOUNTER — Other Ambulatory Visit: Payer: Self-pay | Admitting: Family Medicine

## 2018-08-20 DIAGNOSIS — F39 Unspecified mood [affective] disorder: Secondary | ICD-10-CM

## 2018-08-21 ENCOUNTER — Other Ambulatory Visit: Payer: Self-pay | Admitting: Family Medicine

## 2018-08-21 ENCOUNTER — Telehealth: Payer: Self-pay | Admitting: Family Medicine

## 2018-08-21 DIAGNOSIS — G8929 Other chronic pain: Secondary | ICD-10-CM

## 2018-08-21 NOTE — Telephone Encounter (Signed)
Patient aware and verbalizes understanding. 

## 2018-08-21 NOTE — Telephone Encounter (Signed)
I can refer to pain management. I spoke with urology and they recommended follow up with them or pain management. I will place referral. She is taking the max recommended dose of gabapentin

## 2018-09-10 ENCOUNTER — Other Ambulatory Visit: Payer: Self-pay | Admitting: Family Medicine

## 2018-09-10 DIAGNOSIS — M545 Low back pain, unspecified: Secondary | ICD-10-CM

## 2018-09-10 DIAGNOSIS — G5622 Lesion of ulnar nerve, left upper limb: Secondary | ICD-10-CM

## 2018-09-10 DIAGNOSIS — R319 Hematuria, unspecified: Secondary | ICD-10-CM

## 2018-09-10 DIAGNOSIS — Z87440 Personal history of urinary (tract) infections: Secondary | ICD-10-CM

## 2018-09-10 DIAGNOSIS — Z905 Acquired absence of kidney: Secondary | ICD-10-CM

## 2018-09-12 ENCOUNTER — Ambulatory Visit (HOSPITAL_COMMUNITY): Payer: Medicaid Other | Admitting: Licensed Clinical Social Worker

## 2018-09-12 ENCOUNTER — Other Ambulatory Visit: Payer: Self-pay

## 2018-09-17 ENCOUNTER — Other Ambulatory Visit: Payer: Self-pay | Admitting: Family Medicine

## 2018-09-17 DIAGNOSIS — F39 Unspecified mood [affective] disorder: Secondary | ICD-10-CM

## 2018-09-19 ENCOUNTER — Ambulatory Visit (HOSPITAL_COMMUNITY): Payer: Medicaid Other | Admitting: Licensed Clinical Social Worker

## 2018-09-19 ENCOUNTER — Other Ambulatory Visit: Payer: Self-pay

## 2018-10-04 ENCOUNTER — Other Ambulatory Visit: Payer: Self-pay | Admitting: Family Medicine

## 2018-10-04 DIAGNOSIS — K219 Gastro-esophageal reflux disease without esophagitis: Secondary | ICD-10-CM

## 2018-10-05 ENCOUNTER — Other Ambulatory Visit: Payer: Self-pay | Admitting: *Deleted

## 2018-10-11 ENCOUNTER — Ambulatory Visit (INDEPENDENT_AMBULATORY_CARE_PROVIDER_SITE_OTHER): Payer: Medicaid Other | Admitting: Licensed Clinical Social Worker

## 2018-10-11 ENCOUNTER — Encounter (HOSPITAL_COMMUNITY): Payer: Self-pay | Admitting: Licensed Clinical Social Worker

## 2018-10-11 ENCOUNTER — Other Ambulatory Visit: Payer: Self-pay

## 2018-10-11 DIAGNOSIS — F331 Major depressive disorder, recurrent, moderate: Secondary | ICD-10-CM
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
## 2018-10-16 ENCOUNTER — Other Ambulatory Visit: Payer: Self-pay | Admitting: Family Medicine

## 2018-10-16 DIAGNOSIS — F39 Unspecified mood [affective] disorder: Secondary | ICD-10-CM

## 2018-10-17 NOTE — Telephone Encounter (Signed)
Rakes. NTBS 30 days given 09/17/18 

## 2018-10-18 ENCOUNTER — Other Ambulatory Visit: Payer: Self-pay | Admitting: *Deleted

## 2018-10-18 DIAGNOSIS — F39 Unspecified mood [affective] disorder: Secondary | ICD-10-CM

## 2018-10-18 MED ORDER — FLUOXETINE HCL 20 MG PO CAPS: ORAL_CAPSULE | ORAL | 0 refills | Status: DC

## 2018-10-18 MED ORDER — TRAZODONE HCL 50 MG PO TABS: 50.0000 mg | ORAL_TABLET | Freq: Every evening | ORAL | 0 refills | Status: DC | PRN

## 2018-10-18 MED ORDER — OLANZAPINE 10 MG PO TABS: ORAL_TABLET | ORAL | 0 refills | Status: DC

## 2018-10-18 NOTE — Addendum Note (Signed)
Addended by: Zannie Cove on: 10/18/2018 03:07 PM   Modules accepted: Orders

## 2018-11-01 ENCOUNTER — Other Ambulatory Visit: Payer: Self-pay

## 2018-11-02 ENCOUNTER — Ambulatory Visit: Payer: Medicaid Other | Admitting: Family Medicine

## 2018-11-02 ENCOUNTER — Encounter: Payer: Self-pay | Admitting: Family Medicine

## 2018-11-02 VITALS — BP 137/93 | HR 88 | Temp 97.5°F | Resp 18 | Ht 69.0 in | Wt 363.0 lb

## 2018-11-02 DIAGNOSIS — Z905 Acquired absence of kidney: Secondary | ICD-10-CM | POA: Diagnosis not present

## 2018-11-02 DIAGNOSIS — Z79899 Other long term (current) drug therapy: Secondary | ICD-10-CM | POA: Diagnosis not present

## 2018-11-02 DIAGNOSIS — F39 Unspecified mood [affective] disorder: Secondary | ICD-10-CM

## 2018-11-02 MED ORDER — FLUOXETINE HCL 40 MG PO CAPS: 40.0000 mg | ORAL_CAPSULE | Freq: Every day | ORAL | 1 refills | Status: DC

## 2018-11-02 NOTE — Patient Instructions (Signed)
If your symptoms worsen or you have thoughts of suicide/homicide, PLEASE SEEK IMMEDIATE MEDICAL ATTENTION.  You may always call the National Suicide Hotline.  This is available 24 hours a day, 7 days a week.  Their number is: 1-800-273-8255  Taking the medicine as directed and not missing any doses is one of the best things you can do to treat your depression.  Here are some things to keep in mind:  1) Side effects (stomach upset, some increased anxiety) may happen before you notice a benefit.  These side effects typically go away over time. 2) Changes to your dose of medicine or a change in medication all together is sometimes necessary 3) Most people need to be on medication at least 12 months 4) Many people will notice an improvement within two weeks but the full effect of the medication can take up to 4-6 weeks 5) Stopping the medication when you start feeling better often results in a return of symptoms 6) Never discontinue your medication without contacting a health care professional first.  Some medications require gradual discontinuation/ taper and can make you sick if you stop them abruptly.  If your symptoms worsen or you have thoughts of suicide/homicide, PLEASE SEEK IMMEDIATE MEDICAL ATTENTION.  You may always call:  National Suicide Hotline: 800-273-8255 [place] Crisis Line: 336-832-9700 Crisis Recovery in [place]: 800-939-5911   These are available 24 hours a day, 7 days a week.   

## 2018-11-02 NOTE — Progress Notes (Signed)
Subjective:  Patient ID: Alice [name], female    DOB: [birth year], 33 y.o.   MRN: [id number]  Patient Care Team: Baruch Gouty, FNP as PCP - General (Family Medicine)   Chief Complaint:  Medical Management of Chronic Issues (mood disorder = 6 mo follow up )   HPI: Alice [name] is a 34 y.o. female presenting on 11/02/2018 for Medical Management of Chronic Issues (mood disorder = 6 mo follow up )   Pt following up today for depression and anxiety. Pt states she has talked with a counselor but has yet to see psychiatry. States she has an upcoming appointment with them. States there was an issue with paperwork that caused a delay in scheduling an appointment. Pt states she has been to pain management and is doing well with current treatment regimen. Pt states she does feel slightly on edge and agitated / irritable at times. Feels her depression is well controlled but her anxiety is still bothersome at times. States she has been tolerating all of her medications without associated side effects and taking them as prescribed. She denies SI or HI.  She reports she has been watching what she eats and dieting. States she has lost about 8 pounds according to her home scale.   Depression screen [facility] 2/9 11/02/2018 08/14/2018 07/13/2018 07/14/2017 04/07/2017  Decreased Interest 1 0 0 2 0  Down, Depressed, Hopeless 1 0 0 1 1  PHQ - 2 Score 2 0 0 3 1  Altered sleeping 0 - - 3 -  Tired, decreased energy 1 - - 2 -  Change in appetite 2 - - 3 -  Feeling bad or failure about yourself  0 - - 1 -  Trouble concentrating 0 - - 1 -  Moving slowly or fidgety/restless 0 - - 0 -  Suicidal thoughts 0 - - 0 -  PHQ-9 Score 5 - - 13 -  Difficult doing work/chores Somewhat difficult - - - -  Some recent data might be hidden   GAD 7 : Generalized Anxiety Score 11/02/2018 08/14/2018  Nervous, Anxious, on Edge 0 1  Control/stop worrying 0 1  Worry too much - different things 0 1  Trouble relaxing 0 0    Restless 0 0  Easily annoyed or irritable 1 3  Afraid - awful might happen 0 1  Total GAD 7 Score 1 7  Anxiety Difficulty Somewhat difficult -      Relevant past medical, surgical, family, and social history reviewed and updated as indicated.  Allergies and medications reviewed and updated. Date reviewed: Chart in Epic.   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 Amalia Hailey [facility] [place] [facility]    Preterm labor    Round ligament pain 09/10/2012   Torn ACL     Past Surgical History:  Procedure Laterality Date   addenoidectomy     CESAREAN SECTION N/A 02/22/2013   Procedure: CESAREAN SECTION/TWINS;  Surgeon: Florian Buff, MD;  Location: [facility];  Service: Obstetrics;  Laterality: N/A;  CHOLECYSTECTOMY     GALLBLADDER SURGERY  2019   NASAL SINUS SURGERY     NEPHRECTOMY     right side   OTHER SURGICAL HISTORY     ulner nerve removal   TONSILLECTOMY      Social History   Socioeconomic History   Marital status: Married    Spouse name: Not on file   Number of children: Not on file   Years of education: Not on file   Highest education level: Not on file  Occupational History   Not on file  Social Needs   Financial resource strain: Not on file   Food insecurity    Worry: Not on file    Inability: Not on file   Transportation needs    Medical: Not on file    Non-medical: Not on file  Tobacco Use   Smoking status: Current Every Day Smoker    Packs/day: 0.50    Years: 0.50    Pack years: 0.25    Types: Cigarettes    Last attempt to quit: 06/21/2012    Years since quitting: 6.3    Smokeless tobacco: Never Used  Substance and Sexual Activity   Alcohol use: No   Drug use: No   Sexual activity: Yes    Partners: Male    Birth control/protection: None  Lifestyle   Physical activity    Days per week: Not on file    Minutes per session: Not on file   Stress: Not on file  Relationships   Social connections    Talks on phone: Not on file    Gets together: Not on file    Attends religious service: Not on file    Active member of club or organization: Not on file    Attends meetings of clubs or organizations: Not on file    Relationship status: Not on file   Intimate partner violence    Fear of current or ex partner: Not on file    Emotionally abused: Not on file    Physically abused: Not on file    Forced sexual activity: Not on file  Other Topics Concern   Not on file  Social History Narrative   Not on file    Outpatient Encounter Medications as of 11/02/2018  Medication Sig   HYDROcodone-acetaminophen (NORCO) 10-325 MG tablet Take 1 tablet by mouth 2 (two) times daily.   OLANZapine (ZYPREXA) 10 MG tablet TAKE 1 TABLET BY MOUTH EVERYDAY AT BEDTIME   omeprazole (PRILOSEC) 40 MG capsule TAKE 1 CAPSULE BY MOUTH EVERY DAY   traZODone (DESYREL) 50 MG tablet Take 1 tablet (50 mg total) by mouth at bedtime as needed.   [DISCONTINUED] FLUoxetine (PROZAC) 20 MG capsule TAKE 1 CAPSULE BY MOUTH EVERY DAY   FLUoxetine (PROZAC) 40 MG capsule Take 1 capsule (40 mg total) by mouth daily.   [DISCONTINUED] gabapentin (NEURONTIN) 300 MG capsule Take 1 capsule (300 mg total) by mouth 3 (three) times daily. Patient says 500 mg , TID   [DISCONTINUED] naproxen (NAPROSYN) 500 MG tablet Take 1 tablet (500 mg total) by mouth 2 (two) times daily with a meal.   No facility-administered encounter medications on file as of 11/02/2018.     Allergies  Allergen Reactions   Cinnamon Anaphylaxis    Cinnamon flavoring    Other Swelling    Artificial cinnamon causes  tongue to swell   Reglan [Metoclopramide] Other (See Comments)    Tactile disturbances REACTION: causes skin to "crawl"  Cinnamon Flavor Swelling    Artificial cinnamon causes tongue to swell   Codeine Nausea Only    Straight codeine only Percocet & vicodin are tolerated by pt.   Flomax [Tamsulosin] Other (See Comments)    Other reaction(s): Other (See Comments) Oral sores Oral sores   Ibuprofen Other (See Comments)    Stomach pain Causes severe pain in stomach Pt can tolerate if used sparingly   Levofloxacin Hives   Tylenol [Acetaminophen] Other (See Comments)    Pt reports Tylenol causes her kidney to bleed.    Review of Systems  Constitutional: Positive for appetite change and fatigue. Negative for activity change, chills, diaphoresis, fever and unexpected weight change.  HENT: Negative.   Eyes: Negative.  Negative for photophobia and visual disturbance.  Respiratory: Negative for cough, chest tightness and shortness of breath.   Cardiovascular: Negative for chest pain, palpitations and leg swelling.  Gastrointestinal: Positive for abdominal pain (chronic). Negative for abdominal distention, anal bleeding, blood in stool, constipation, diarrhea, nausea, rectal pain and vomiting.  Endocrine: Negative.  Negative for cold intolerance, heat intolerance, polydipsia, polyphagia and polyuria.  Genitourinary: Positive for flank pain (chronic). Negative for decreased urine volume, difficulty urinating, dysuria, frequency and urgency.  Musculoskeletal: Positive for arthralgias (chronic) and myalgias (chronic). Negative for back pain.  Skin: Negative.  Negative for color change and pallor.  Allergic/Immunologic: Negative.   Neurological: Negative for dizziness, syncope, weakness, light-headedness and headaches.  Hematological: Negative.  Negative for adenopathy. Does not bruise/bleed easily.  Psychiatric/Behavioral: Positive for agitation. Negative for behavioral problems,  confusion, decreased concentration, dysphoric mood, hallucinations, self-injury, sleep disturbance and suicidal ideas. The patient is nervous/anxious. The patient is not hyperactive.   All other systems reviewed and are negative.       Objective:  BP (!) 137/93 (BP Location: Left Wrist, Cuff Size: Small)    Pulse 88    Temp (!) 97.5 F (36.4 C)    Resp 18    Ht '5\' 9"'$  (1.753 m)    Wt (!) 363 lb (164.7 kg)    LMP 10/07/2018    SpO2 95%    BMI 53.61 kg/m    Wt Readings from Last 3 Encounters:  11/02/18 (!) 363 lb (164.7 kg)  07/13/18 (!) 355 lb (161 kg)  05/23/18 300 lb (136.1 kg)    Physical Exam Vitals signs and nursing note reviewed.  Constitutional:      General: She is not in acute distress.    Appearance: Normal appearance. She is well-developed and well-groomed. She is morbidly obese. She is not ill-appearing, toxic-appearing or diaphoretic.  HENT:     Head: Normocephalic and atraumatic.     Jaw: There is normal jaw occlusion.     Right Ear: Hearing normal.     Left Ear: Hearing normal.     Nose: Nose normal.     Mouth/Throat:     Lips: Pink.     Mouth: Mucous membranes are moist.     Pharynx: Oropharynx is clear. Uvula midline.  Eyes:     General: Lids are normal.     Extraocular Movements: Extraocular movements intact.     Conjunctiva/sclera: Conjunctivae normal.     Pupils: Pupils are equal, round, and reactive to light.  Neck:     Musculoskeletal: Normal range of motion and neck supple.     Thyroid: No thyroid mass, thyromegaly or thyroid tenderness.     Vascular: No carotid bruit or JVD.     Trachea: Trachea and phonation normal.  Cardiovascular:     Rate and Rhythm: Normal rate and regular rhythm.     Chest Wall: PMI is not displaced.     Pulses: Normal pulses.     Heart sounds: Normal heart sounds. No murmur. No friction rub. No gallop.   Pulmonary:     Effort: Pulmonary effort is normal. No respiratory distress.     Breath sounds: Normal breath sounds. No  wheezing.  Abdominal:     General: Bowel sounds are normal. There is no distension or abdominal bruit.     Palpations: Abdomen is soft. There is no hepatomegaly or splenomegaly.     Tenderness: There is no abdominal tenderness. There is no right CVA tenderness or left CVA tenderness.     Hernia: No hernia is present.  Musculoskeletal: Normal range of motion.     Right lower leg: No edema.     Left lower leg: No edema.  Lymphadenopathy:     Cervical: No cervical adenopathy.  Skin:    General: Skin is warm and dry.     Capillary Refill: Capillary refill takes less than 2 seconds.     Coloration: Skin is not cyanotic, jaundiced or pale.     Findings: No rash.  Neurological:     General: No focal deficit present.     Mental Status: She is alert and oriented to person, place, and time.     Cranial Nerves: Cranial nerves are intact.     Sensory: Sensation is intact.     Motor: Motor function is intact.     Coordination: Coordination is intact.     Gait: Gait is intact.     Deep Tendon Reflexes: Reflexes are normal and symmetric.  Psychiatric:        Attention and Perception: Attention and perception normal.        Mood and Affect: Mood and affect normal.        Speech: Speech normal.        Behavior: Behavior normal. Behavior is cooperative.        Thought Content: Thought content normal. Thought content does not include homicidal or suicidal ideation. Thought content does not include homicidal or suicidal plan.        Cognition and Memory: Cognition and memory normal.        Judgment: Judgment normal.     Results for orders placed or performed in visit on 07/13/18  CMP14+EGFR  Result Value Ref Range   Glucose 66 65 - 99 mg/dL   BUN 14 6 - 20 mg/dL   Creatinine, Ser 0.89 0.57 - 1.00 mg/dL   GFR calc non Af Amer 85 >59 mL/min/1.73   GFR calc Af Amer 98 >59 mL/min/1.73   BUN/Creatinine Ratio 16 9 - 23   Sodium 143 134 - 144 mmol/L   Potassium 4.9 3.5 - 5.2 mmol/L   Chloride 110  (H) 96 - 106 mmol/L   CO2 20 20 - 29 mmol/L   Calcium 8.7 8.7 - 10.2 mg/dL   Total Protein 6.0 6.0 - 8.5 g/dL   Albumin 3.7 (L) 3.8 - 4.8 g/dL   Globulin, Total 2.3 1.5 - 4.5 g/dL   Albumin/Globulin Ratio 1.6 1.2 - 2.2   Bilirubin Total <0.2 0.0 - 1.2 mg/dL   Alkaline Phosphatase 85 39 - 117 IU/L   AST 12 0 - 40 IU/L   ALT 13 0 - 32 IU/L  CBC with Differential/Platelet  Result Value Ref Range   WBC 10.0 3.4 - 10.8  x10E3/uL   RBC 4.19 3.77 - 5.28 x10E6/uL   Hemoglobin 13.2 11.1 - 15.9 g/dL   Hematocrit 41.4 34.0 - 46.6 %   MCV 99 (H) 79 - 97 fL   MCH 31.5 26.6 - 33.0 pg   MCHC 31.9 31.5 - 35.7 g/dL   RDW 14.2 11.7 - 15.4 %   Platelets 338 150 - 450 x10E3/uL   Neutrophils 62 Not Estab. %   Lymphs 23 Not Estab. %   Monocytes 8 Not Estab. %   Eos 6 Not Estab. %   Basos 1 Not Estab. %   Neutrophils Absolute 6.3 1.4 - 7.0 x10E3/uL   Lymphocytes Absolute 2.3 0.7 - 3.1 x10E3/uL   Monocytes Absolute 0.8 0.1 - 0.9 x10E3/uL   EOS (ABSOLUTE) 0.6 (H) 0.0 - 0.4 x10E3/uL   Basophils Absolute 0.1 0.0 - 0.2 x10E3/uL   Immature Granulocytes 0 Not Estab. %   Immature Grans (Abs) 0.0 0.0 - 0.1 x10E3/uL  Lipid panel  Result Value Ref Range   Cholesterol, Total 245 (H) 100 - 199 mg/dL   Triglycerides 257 (H) 0 - 149 mg/dL   HDL 38 (L) >39 mg/dL   VLDL Cholesterol Cal 51 (H) 5 - 40 mg/dL   LDL Calculated 156 (H) 0 - 99 mg/dL   Chol/HDL Ratio 6.4 (H) 0.0 - 4.4 ratio  Thyroid Panel With TSH  Result Value Ref Range   TSH 1.320 0.450 - 4.500 uIU/mL   T4, Total 6.7 4.5 - 12.0 ug/dL   T3 Uptake Ratio 23 (L) 24 - 39 %   Free Thyroxine Index 1.5 1.2 - 4.9  ToxASSURE Select 13 (MW), Urine  Result Value Ref Range   Summary FINAL   Microalbumin / creatinine urine ratio  Result Value Ref Range   Creatinine, Urine 61.8 Not Estab. mg/dL   Microalbumin, Urine <3.0 Not Estab. ug/mL   Microalb/Creat Ratio <5 0 - 29 mg/g creat       Pertinent labs & imaging results that were available during my  care of the patient were reviewed by me and considered in my medical decision making.  Assessment & Plan:  [place] was seen today for medical management of chronic issues.  Diagnoses and all orders for this visit:  Mood disorder ([place]) Due to ongoing depression, anxiety, and agitation, will increase fluoxetine to 40 mg daily. Pt aware to report any new or worsening symptoms. Pt aware to keep follow up with counselor and psychiatrist. Pt safe to self and others, denies SI or HI. Crisis hotline information provided. Medications as prescribed.  -     FLUoxetine (PROZAC) 40 MG capsule; Take 1 capsule (40 mg total) by mouth daily.  Single kidney High risk medication use Pt with single kidney and on high risk medications. Will recheck kidney function today.  -     CMP14+EGFR  Morbid obesity due to excess calories ([facility]) Diet and exercise encouraged. Will continue to monitor weight at home. Report any weight gain greater than 3 pounds per day or 5 pounds in one week.     Continue all other maintenance medications.  Follow up plan: Return in about 3 months (around 02/01/2019), or if symptoms worsen or fail to improve.  Continue healthy lifestyle choices, including diet (rich in fruits, vegetables, and lean proteins, and low in salt and simple carbohydrates) and exercise (at least 30 minutes of moderate physical activity daily).  Educational handout given for depression   The above assessment and management plan was discussed with the  patient. The patient verbalized understanding of and has agreed to the management plan. Patient is aware to call the clinic if they develop any new symptoms or if symptoms persist or worsen. Patient is aware when to return to the clinic for a follow-up visit. Patient educated on when it is appropriate to go to the emergency department.   Monia Pouch, FNP-C [facility] [phone]

## 2018-11-03 LAB — CMP14+EGFR
ALT: 20 IU/L (ref 0–32)
AST: 18 IU/L (ref 0–40)
Albumin/Globulin Ratio: 1.6 (ref 1.2–2.2)
Albumin: 4.2 g/dL (ref 3.8–4.8)
Alkaline Phosphatase: 94 IU/L (ref 39–117)
BUN/Creatinine Ratio: 16 (ref 9–23)
BUN: 15 mg/dL (ref 6–20)
Bilirubin Total: <0.2 mg/dL (ref 0.0–1.2)
CO2: 22 mmol/L (ref 20–29)
Calcium: 9.5 mg/dL (ref 8.7–10.2)
Chloride: 105 mmol/L (ref 96–106)
Creatinine, Ser: 0.93 mg/dL (ref 0.57–1.00)
GFR calc Af Amer: 93 mL/min/{1.73_m2} (ref >59)
GFR calc non Af Amer: 80 mL/min/{1.73_m2} (ref >59)
Globulin, Total: 2.7 g/dL (ref 1.5–4.5)
Glucose: 102 mg/dL — ABNORMAL HIGH (ref 65–99)
Potassium: 4.3 mmol/L (ref 3.5–5.2)
Sodium: 138 mmol/L (ref 134–144)
Total Protein: 6.9 g/dL (ref 6.0–8.5)

## 2018-11-20 ENCOUNTER — Other Ambulatory Visit: Payer: Self-pay | Admitting: Family Medicine

## 2018-11-20 DIAGNOSIS — F39 Unspecified mood [affective] disorder: Secondary | ICD-10-CM

## 2019-01-07 ENCOUNTER — Other Ambulatory Visit: Payer: Self-pay | Admitting: Family Medicine

## 2019-01-07 DIAGNOSIS — K219 Gastro-esophageal reflux disease without esophagitis: Secondary | ICD-10-CM

## 2019-01-19 ENCOUNTER — Other Ambulatory Visit: Payer: Self-pay | Admitting: Family Medicine

## 2019-01-19 DIAGNOSIS — F39 Unspecified mood [affective] disorder: Secondary | ICD-10-CM

## 2019-01-21 ENCOUNTER — Other Ambulatory Visit: Payer: Self-pay | Admitting: Family Medicine

## 2019-01-21 DIAGNOSIS — M545 Low back pain, unspecified: Secondary | ICD-10-CM

## 2019-01-21 DIAGNOSIS — R319 Hematuria, unspecified: Secondary | ICD-10-CM

## 2019-01-22 ENCOUNTER — Other Ambulatory Visit: Payer: Self-pay | Admitting: Family Medicine

## 2019-01-22 DIAGNOSIS — F39 Unspecified mood [affective] disorder: Secondary | ICD-10-CM

## 2019-01-23 ENCOUNTER — Other Ambulatory Visit: Payer: Self-pay

## 2019-01-24 ENCOUNTER — Ambulatory Visit: Payer: Medicaid Other | Admitting: Family Medicine

## 2019-01-24 ENCOUNTER — Encounter: Payer: Self-pay | Admitting: Family Medicine

## 2019-01-24 VITALS — BP 137/83 | HR 76 | Temp 97.0°F | Resp 20 | Ht 69.0 in | Wt 369.0 lb

## 2019-01-24 DIAGNOSIS — F411 Generalized anxiety disorder: Secondary | ICD-10-CM | POA: Diagnosis not present

## 2019-01-24 DIAGNOSIS — F39 Unspecified mood [affective] disorder: Secondary | ICD-10-CM | POA: Diagnosis not present

## 2019-01-24 MED ORDER — HYDROXYZINE PAMOATE 25 MG PO CAPS: 25.0000 mg | ORAL_CAPSULE | Freq: Three times a day (TID) | ORAL | 1 refills | Status: AC | PRN

## 2019-01-24 MED ORDER — TRAZODONE HCL 50 MG PO TABS: 100.0000 mg | ORAL_TABLET | Freq: Every evening | ORAL | 1 refills | Status: DC | PRN

## 2019-01-24 MED ORDER — OLANZAPINE 10 MG PO TABS: 10.0000 mg | ORAL_TABLET | Freq: Every day | ORAL | 1 refills | Status: AC

## 2019-01-24 NOTE — Progress Notes (Signed)
Subjective:  Patient ID: Alice [name], female    DOB: 01/15/1986, 33 y.o.   MRN: [id number]  Patient Care Team: Baruch Gouty, FNP as PCP - General (Family Medicine)   Chief Complaint:  Medical Management of Chronic Issues and Medication Refill   HPI: Alice [name] is a 33 y.o. female presenting on 01/24/2019 for Medical Management of Chronic Issues and Medication Refill   Pt following up today for anxiety and mood disorder. Pt has yet to see psychiatry but has seen a counselor. Pt states she is not sure why she has not had a visit with psychiatry and is willing to go. Pt states she is doing better with her depression since increasing the fluoxetine but her anxiety seems to be worsening. States she is having more days with anxiety than usual. No SI, HI, hallucinations, or delusions. States she is having difficulty sleeping at times, states the trazodone 50 mg helps at times but she still wakes during the night.   Medication Refill This is a chronic problem. The current episode started more than 1 month ago. The problem has been waxing and waning. Associated symptoms include arthralgias, fatigue and myalgias. Pertinent negatives include no abdominal pain, anorexia, change in bowel habit, chest pain, chills, congestion, coughing, diaphoresis, fever, headaches, joint swelling, nausea, neck pain, numbness, rash, sore throat, swollen glands, urinary symptoms, vertigo, visual change, vomiting or weakness. The symptoms are aggravated by stress.    GAD 7 : Generalized Anxiety Score 01/24/2019 11/02/2018 08/14/2018  Nervous, Anxious, on Edge 2 0 1  Control/stop worrying 2 0 1  Worry too much - different things 1 0 1  Trouble relaxing 1 0 0  Restless 0 0 0  Easily annoyed or irritable '2 1 3  '$ Afraid - awful might happen 1 0 1  Total GAD 7 Score '9 1 7  '$ Anxiety Difficulty - Somewhat difficult -    Depression screen [facility] 2/9 01/24/2019 11/02/2018 08/14/2018 07/13/2018 07/14/2017  Decreased  Interest 0 1 0 0 2  Down, Depressed, Hopeless 0 1 0 0 1  PHQ - 2 Score 0 2 0 0 3  Altered sleeping - 0 - - 3  Tired, decreased energy - 1 - - 2  Change in appetite - 2 - - 3  Feeling bad or failure about yourself  - 0 - - 1  Trouble concentrating - 0 - - 1  Moving slowly or fidgety/restless - 0 - - 0  Suicidal thoughts - 0 - - 0  PHQ-9 Score - 5 - - 13  Difficult doing work/chores - Somewhat difficult - - -  Some recent data might be hidden     Relevant past medical, surgical, family, and social history reviewed and updated as indicated.  Allergies and medications reviewed and updated. Date reviewed: Chart in Epic.   Past Medical History:  Diagnosis Date  . Chronic left flank pain   . Chronic pain   . Congenital obstruction of ureteropelvic junction (UPJ)   . 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 Fatty liver   . GERD (gastroesophageal reflux disease)   . History of kidney stones   . History of recurrent UTIs   . Hypertension   . Kidney disease   . Loin pain hematuria syndrome    Dr. Amalia Hailey at [facility]  . Morbid obesity [facility])   . Preterm labor   . Round ligament pain 09/10/2012  . Torn ACL  Past Surgical History:  Procedure Laterality Date  . addenoidectomy    . CESAREAN SECTION N/A 02/22/2013   Procedure: CESAREAN SECTION/TWINS;  Surgeon: Florian Buff, MD;  Location: [facility];  Service: Obstetrics;  Laterality: N/A;  . CHOLECYSTECTOMY    . GALLBLADDER SURGERY  2019  . NASAL SINUS SURGERY    . NEPHRECTOMY     right side  . OTHER SURGICAL HISTORY     ulner nerve removal  . TONSILLECTOMY      Social History   Socioeconomic History  . Marital status: Married    Spouse name: Not on file  . Number of children: Not on file  . Years of education: Not on file  . Highest education level: Not on file  Occupational History  . Not on file  Tobacco Use  . Smoking status: Current Every Day Smoker    Packs/day: 0.50    Years: 0.50    Pack  years: 0.25    Types: Cigarettes    Last attempt to quit: 06/21/2012    Years since quitting: 6.5  . Smokeless tobacco: Never Used  Substance and Sexual Activity  . Alcohol use: No  . Drug use: No  . Sexual activity: Yes    Partners: Male    Birth control/protection: None  Other Topics Concern  . Not on file  Social History Narrative  . Not on file   Social Determinants of Health   Financial Resource Strain:   . Difficulty of Paying Living Expenses: Not on file  Food Insecurity:   . Worried About Charity fundraiser in the Last Year: Not on file  . Ran Out of Food in the Last Year: Not on file  Transportation Needs:   . Lack of Transportation (Medical): Not on file  . Lack of Transportation (Non-Medical): Not on file  Physical Activity:   . Days of Exercise per Week: Not on file  . Minutes of Exercise per Session: Not on file  Stress:   . Feeling of Stress : Not on file  Social Connections:   . Frequency of Communication with Friends and Family: Not on file  . Frequency of Social Gatherings with Friends and Family: Not on file  . Attends Religious Services: Not on file  . Active Member of Clubs or Organizations: Not on file  . Attends Archivist Meetings: Not on file  . Marital Status: Not on file  Intimate Partner Violence:   . Fear of Current or Ex-Partner: Not on file  . Emotionally Abused: Not on file  . Physically Abused: Not on file  . Sexually Abused: Not on file    Outpatient Encounter Medications as of 01/24/2019  Medication Sig  . FLUoxetine (PROZAC) 40 MG capsule Take 1 capsule (40 mg total) by mouth daily.  Marland Kitchen HYDROcodone-acetaminophen (NORCO) 10-325 MG tablet Take 1 tablet by mouth 2 (two) times daily.  Marland Kitchen OLANZapine (ZYPREXA) 10 MG tablet Take 1 tablet (10 mg total) by mouth at bedtime.  Marland Kitchen omeprazole (PRILOSEC) 40 MG capsule TAKE 1 CAPSULE BY MOUTH EVERY DAY  . traZODone (DESYREL) 50 MG tablet Take 2 tablets (100 mg total) by mouth at bedtime as  needed.  . [DISCONTINUED] OLANZapine (ZYPREXA) 10 MG tablet TAKE 1 TABLET BY MOUTH EVERYDAY AT BEDTIME  . [DISCONTINUED] traZODone (DESYREL) 50 MG tablet TAKE 1 TABLET (50 MG TOTAL) BY MOUTH AT BEDTIME AS NEEDED.  . hydrOXYzine (VISTARIL) 25 MG capsule Take 1 capsule (25 mg total) by mouth 3 (  three) times daily as needed for anxiety.   No facility-administered encounter medications on file as of 01/24/2019.    Allergies  Allergen Reactions  . Cinnamon Anaphylaxis    Cinnamon flavoring   . Other Swelling    Artificial cinnamon causes tongue to swell  . Reglan [Metoclopramide] Other (See Comments)    Tactile disturbances REACTION: causes skin to "crawl"  . Cinnamon Flavor Swelling    Artificial cinnamon causes tongue to swell  . Codeine Nausea Only    Straight codeine only Percocet & vicodin are tolerated by pt.  . Flomax [Tamsulosin] Other (See Comments)    Other reaction(s): Other (See Comments) Oral sores Oral sores  . Ibuprofen Other (See Comments)    Stomach pain Causes severe pain in stomach Pt can tolerate if used sparingly  . Levofloxacin Hives  . Tylenol [Acetaminophen] Other (See Comments)    Pt reports Tylenol causes her kidney to bleed.    Review of Systems  Constitutional: Positive for fatigue. Negative for activity change, appetite change, chills, diaphoresis, fever and unexpected weight change.  HENT: Negative.  Negative for congestion and sore throat.   Eyes: Negative.  Negative for photophobia and visual disturbance.  Respiratory: Negative for cough, chest tightness and shortness of breath.   Cardiovascular: Negative for chest pain, palpitations and leg swelling.  Gastrointestinal: Negative for abdominal pain, anorexia, blood in stool, change in bowel habit, constipation, diarrhea, nausea and vomiting.  Endocrine: Negative.  Negative for cold intolerance, heat intolerance, polydipsia, polyphagia and polyuria.  Genitourinary: Negative for dysuria, frequency  and urgency.  Musculoskeletal: Positive for arthralgias and myalgias. Negative for back pain, gait problem, joint swelling and neck pain.  Skin: Negative.  Negative for rash.  Allergic/Immunologic: Negative.   Neurological: Negative for dizziness, vertigo, tremors, seizures, syncope, facial asymmetry, speech difficulty, weakness, light-headedness, numbness and headaches.  Hematological: Negative.   Psychiatric/Behavioral: Positive for agitation, behavioral problems, decreased concentration, dysphoric mood and sleep disturbance. Negative for confusion, hallucinations, self-injury and suicidal ideas. The patient is nervous/anxious. The patient is not hyperactive.   All other systems reviewed and are negative.       Objective:  BP 137/83   Pulse 76   Temp (!) 97 F (36.1 C)   Resp 20   Ht '5\' 9"'$  (1.753 m)   Wt (!) 369 lb (167.4 kg)   LMP 01/21/2019   SpO2 98%   BMI 54.49 kg/m    Wt Readings from Last 3 Encounters:  01/24/19 (!) 369 lb (167.4 kg)  11/02/18 (!) 363 lb (164.7 kg)  07/13/18 (!) 355 lb (161 kg)    Physical Exam Vitals and nursing note reviewed.  Constitutional:      General: She is not in acute distress.    Appearance: Normal appearance. She is well-developed and well-groomed. She is morbidly obese. She is not ill-appearing, toxic-appearing or diaphoretic.  HENT:     Head: Normocephalic and atraumatic.     Jaw: There is normal jaw occlusion.     Right Ear: Hearing normal.     Left Ear: Hearing normal.     Nose: Nose normal.     Mouth/Throat:     Lips: Pink.     Mouth: Mucous membranes are moist.     Pharynx: Oropharynx is clear. Uvula midline.  Eyes:     General: Lids are normal.     Extraocular Movements: Extraocular movements intact.     Conjunctiva/sclera: Conjunctivae normal.     Pupils: Pupils are equal, round, and reactive  to light.  Neck:     Thyroid: No thyroid mass, thyromegaly or thyroid tenderness.     Vascular: No carotid bruit or JVD.      Trachea: Trachea and phonation normal.  Cardiovascular:     Rate and Rhythm: Normal rate and regular rhythm.     Chest Wall: PMI is not displaced.     Pulses: Normal pulses.     Heart sounds: Normal heart sounds. No murmur. No friction rub. No gallop.   Pulmonary:     Effort: Pulmonary effort is normal. No respiratory distress.     Breath sounds: Normal breath sounds. No wheezing.  Abdominal:     General: Bowel sounds are normal. There is no distension or abdominal bruit.     Palpations: Abdomen is soft. There is no hepatomegaly or splenomegaly.     Tenderness: There is no abdominal tenderness. There is no right CVA tenderness or left CVA tenderness.     Hernia: No hernia is present.  Musculoskeletal:        General: Normal range of motion.     Cervical back: Normal range of motion and neck supple.     Right lower leg: No edema.     Left lower leg: No edema.  Lymphadenopathy:     Cervical: No cervical adenopathy.  Skin:    General: Skin is warm and dry.     Capillary Refill: Capillary refill takes less than 2 seconds.     Coloration: Skin is not cyanotic, jaundiced or pale.     Findings: No rash.  Neurological:     General: No focal deficit present.     Mental Status: She is alert and oriented to person, place, and time.     Cranial Nerves: Cranial nerves are intact.     Sensory: Sensation is intact.     Motor: Motor function is intact.     Coordination: Coordination is intact.     Gait: Gait is intact.     Deep Tendon Reflexes: Reflexes are normal and symmetric.  Psychiatric:        Attention and Perception: Attention and perception normal.        Mood and Affect: Affect normal. Mood is anxious.        Speech: Speech normal.        Behavior: Behavior is agitated. Behavior is not slowed, aggressive, withdrawn, hyperactive or combative. Behavior is cooperative.        Thought Content: Thought content normal.        Cognition and Memory: Cognition and memory normal.         Judgment: Judgment normal.     Results for orders placed or performed in visit on 11/02/18  CMP14+EGFR  Result Value Ref Range   Glucose 102 (H) 65 - 99 mg/dL   BUN 15 6 - 20 mg/dL   Creatinine, Ser 0.93 0.57 - 1.00 mg/dL   GFR calc non Af Amer 80 >59 mL/min/1.73   GFR calc Af Amer 93 >59 mL/min/1.73   BUN/Creatinine Ratio 16 9 - 23   Sodium 138 134 - 144 mmol/L   Potassium 4.3 3.5 - 5.2 mmol/L   Chloride 105 96 - 106 mmol/L   CO2 22 20 - 29 mmol/L   Calcium 9.5 8.7 - 10.2 mg/dL   Total Protein 6.9 6.0 - 8.5 g/dL   Albumin 4.2 3.8 - 4.8 g/dL   Globulin, Total 2.7 1.5 - 4.5 g/dL   Albumin/Globulin Ratio 1.6 1.2 - 2.2  Bilirubin Total <0.2 0.0 - 1.2 mg/dL   Alkaline Phosphatase 94 39 - 117 IU/L   AST 18 0 - 40 IU/L   ALT 20 0 - 32 IU/L       Pertinent labs & imaging results that were available during my care of the patient were reviewed by me and considered in my medical decision making.  Assessment & Plan:  [place] was seen today for medical management of chronic issues and medication refill.  Diagnoses and all orders for this visit:  GAD (generalized anxiety disorder) Mood disorder ([place]) Will reach out to referrals pertaining to psychiatry appointment. Will refill below. Pt aware she needs to see psychiatry and to keep her appointment if one is received.  -     OLANZapine (ZYPREXA) 10 MG tablet; Take 1 tablet (10 mg total) by mouth at bedtime. -     traZODone (DESYREL) 50 MG tablet; Take 2 tablets (100 mg total) by mouth at bedtime as needed. -     hydrOXYzine (VISTARIL) 25 MG capsule; Take 1 capsule (25 mg total) by mouth 3 (three) times daily as needed for anxiety.     Continue all other maintenance medications.  Follow up plan: Return in about 3 months (around 04/24/2019), or if symptoms worsen or fail to improve.  Continue healthy lifestyle choices, including diet (rich in fruits, vegetables, and lean proteins, and low in salt and simple carbohydrates) and  exercise (at least 30 minutes of moderate physical activity daily).  Educational handout given for depression  The above assessment and management plan was discussed with the patient. The patient verbalized understanding of and has agreed to the management plan. Patient is aware to call the clinic if they develop any new symptoms or if symptoms persist or worsen. Patient is aware when to return to the clinic for a follow-up visit. Patient educated on when it is appropriate to go to the emergency department.   Monia Pouch, FNP-C [facility] [phone]

## 2019-01-24 NOTE — Patient Instructions (Signed)
If your symptoms worsen or you have thoughts of suicide/homicide, PLEASE SEEK IMMEDIATE MEDICAL ATTENTION.  You may always call the National Suicide Hotline.  This is available 24 hours a day, 7 days a week.  Their number is: 1-800-273-8255  Taking the medicine as directed and not missing any doses is one of the best things you can do to treat your depression.  Here are some things to keep in mind:  1) Side effects (stomach upset, some increased anxiety) may happen before you notice a benefit.  These side effects typically go away over time. 2) Changes to your dose of medicine or a change in medication all together is sometimes necessary 3) Most people need to be on medication at least 12 months 4) Many people will notice an improvement within two weeks but the full effect of the medication can take up to 4-6 weeks 5) Stopping the medication when you start feeling better often results in a return of symptoms 6) Never discontinue your medication without contacting a health care professional first.  Some medications require gradual discontinuation/ taper and can make you sick if you stop them abruptly.  If your symptoms worsen or you have thoughts of suicide/homicide, PLEASE SEEK IMMEDIATE MEDICAL ATTENTION.  You may always call:  National Suicide Hotline: 800-273-8255 [place] Crisis Line: 336-832-9700 Crisis Recovery in [place]: 800-939-5911   These are available 24 hours a day, 7 days a week.   

## 2019-01-30 ENCOUNTER — Other Ambulatory Visit: Payer: Self-pay | Admitting: Family Medicine

## 2019-01-30 DIAGNOSIS — F39 Unspecified mood [affective] disorder: Secondary | ICD-10-CM

## 2019-01-30 DIAGNOSIS — F411 Generalized anxiety disorder: Secondary | ICD-10-CM

## 2019-02-18 ENCOUNTER — Other Ambulatory Visit: Payer: Self-pay | Admitting: Family Medicine

## 2019-02-18 DIAGNOSIS — F39 Unspecified mood [affective] disorder: Secondary | ICD-10-CM

## 2019-03-01 ENCOUNTER — Other Ambulatory Visit: Payer: Self-pay | Admitting: Family Medicine

## 2019-03-01 ENCOUNTER — Telehealth: Payer: Self-pay | Admitting: Family Medicine

## 2019-03-01 DIAGNOSIS — F39 Unspecified mood [affective] disorder: Secondary | ICD-10-CM

## 2019-03-01 MED ORDER — TRAZODONE HCL 50 MG PO TABS: 100.0000 mg | ORAL_TABLET | Freq: Every evening | ORAL | 0 refills | Status: DC | PRN

## 2019-03-01 NOTE — Telephone Encounter (Signed)
FYI Patient says Alice [name] says they got records on her but was unaware that she needed an appointment  so therefore [facility] was to blame.

## 2019-03-01 NOTE — Telephone Encounter (Signed)
Noted  

## 2019-03-01 NOTE — Telephone Encounter (Signed)
Patient had contacted [facility] and left a voicemail for an appointment but never received a call back from then.  I advised to call them again. She does not understand why she can't have refills until she gets an appointment.

## 2019-03-01 NOTE — Telephone Encounter (Signed)
I have provided multiple refills while awaiting an appointment with psychiatry. I will give a 30 day refill. But this will be the last one. She has to follow up with psychiatry. We have discussed this at every visit.

## 2019-03-01 NOTE — Telephone Encounter (Signed)
I have placed several referrals and pt was aware to call and schedule an appointment.

## 2019-03-01 NOTE — Telephone Encounter (Signed)
She was supposed to follow up with psychiatry for evaluation and management due to high risk medications.

## 2019-04-02 ENCOUNTER — Other Ambulatory Visit: Payer: Self-pay | Admitting: Family Medicine

## 2019-04-02 DIAGNOSIS — K219 Gastro-esophageal reflux disease without esophagitis: Secondary | ICD-10-CM

## 2019-07-05 ENCOUNTER — Other Ambulatory Visit: Payer: Self-pay | Admitting: *Deleted

## 2019-07-05 DIAGNOSIS — K219 Gastro-esophageal reflux disease without esophagitis: Secondary | ICD-10-CM

## 2019-07-05 MED ORDER — OMEPRAZOLE 40 MG PO CPDR: 40.0000 mg | DELAYED_RELEASE_CAPSULE | Freq: Every day | ORAL | 0 refills | Status: DC

## 2019-08-07 ENCOUNTER — Other Ambulatory Visit: Payer: Self-pay | Admitting: *Deleted

## 2019-08-07 DIAGNOSIS — F39 Unspecified mood [affective] disorder: Secondary | ICD-10-CM

## 2019-08-14 ENCOUNTER — Other Ambulatory Visit: Payer: Self-pay | Admitting: *Deleted

## 2019-08-14 DIAGNOSIS — F39 Unspecified mood [affective] disorder: Secondary | ICD-10-CM

## 2019-10-23 ENCOUNTER — Telehealth: Payer: Self-pay | Admitting: Family Medicine

## 2019-10-23 NOTE — Telephone Encounter (Signed)
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 States she has been on Gabapentin for this pain for a long time. States it was initially prescribed by her urologist. She does not have a follow up appointment scheduled due to COVID-19.  In 06/2018.  This is the last time it was mentioned.  This has not been recorded in notes since.

## 2019-10-23 NOTE — Telephone Encounter (Signed)
Pt has a disability hearing Friday and has to provide evidence of her chronic kidney disease pain. She says that our office referred her to specialist for this. She needs a letter to take with her that she has debilitating pain. Please call when ready or if we cannot write the letter.

## 2019-10-23 NOTE — Telephone Encounter (Signed)
Okay for letter? [facility] was her pcp

## 2019-10-23 NOTE — Telephone Encounter (Signed)
Could you please type this letter for Dr. Reece Agar.

## 2019-10-24 NOTE — Telephone Encounter (Signed)
Letter typed/ Dr. Reece Agar signed / Patient picked up today 10/24/19

## 2021-12-12 DIAGNOSIS — G43909 Migraine, unspecified, not intractable, without status migrainosus: Secondary | ICD-10-CM | POA: Insufficient documentation

## 2021-12-12 DIAGNOSIS — M793 Panniculitis, unspecified: Secondary | ICD-10-CM | POA: Diagnosis present

## 2021-12-12 DIAGNOSIS — J189 Pneumonia, unspecified organism: Secondary | ICD-10-CM | POA: Insufficient documentation

## 2022-02-13 ENCOUNTER — Emergency Department (HOSPITAL_COMMUNITY): Payer: Medicaid Other

## 2022-02-13 ENCOUNTER — Inpatient Hospital Stay (HOSPITAL_COMMUNITY)
Admission: EM | Admit: 2022-02-13 | Discharge: 2022-03-03 | DRG: 870 | Disposition: A | Discharge status: Home / Self Care | Payer: Medicaid Other | Attending: Family Medicine | Admitting: Family Medicine

## 2022-02-13 ENCOUNTER — Encounter (HOSPITAL_COMMUNITY): Payer: Self-pay

## 2022-02-13 ENCOUNTER — Inpatient Hospital Stay: Admit: 2022-02-13 | Payer: Medicaid Other

## 2022-02-13 ENCOUNTER — Other Ambulatory Visit: Payer: Self-pay

## 2022-02-13 DIAGNOSIS — S31109A Unspecified open wound of abdominal wall, unspecified quadrant without penetration into peritoneal cavity, initial encounter: Secondary | ICD-10-CM | POA: Diagnosis not present

## 2022-02-13 DIAGNOSIS — N170 Acute kidney failure with tubular necrosis: Secondary | ICD-10-CM | POA: Diagnosis present

## 2022-02-13 DIAGNOSIS — E785 Hyperlipidemia, unspecified: Secondary | ICD-10-CM | POA: Diagnosis present

## 2022-02-13 DIAGNOSIS — G9341 Metabolic encephalopathy: Secondary | ICD-10-CM | POA: Diagnosis not present

## 2022-02-13 DIAGNOSIS — Z8249 Family history of ischemic heart disease and other diseases of the circulatory system: Secondary | ICD-10-CM

## 2022-02-13 DIAGNOSIS — J9601 Acute respiratory failure with hypoxia: Secondary | ICD-10-CM | POA: Insufficient documentation

## 2022-02-13 DIAGNOSIS — T50911A Poisoning by multiple unspecified drugs, medicaments and biological substances, accidental (unintentional), initial encounter: Secondary | ICD-10-CM | POA: Diagnosis present

## 2022-02-13 DIAGNOSIS — A419 Sepsis, unspecified organism: Secondary | ICD-10-CM | POA: Diagnosis present

## 2022-02-13 DIAGNOSIS — Z885 Allergy status to narcotic agent status: Secondary | ICD-10-CM

## 2022-02-13 DIAGNOSIS — Z886 Allergy status to analgesic agent status: Secondary | ICD-10-CM

## 2022-02-13 DIAGNOSIS — R112 Nausea with vomiting, unspecified: Secondary | ICD-10-CM | POA: Diagnosis not present

## 2022-02-13 DIAGNOSIS — J80 Acute respiratory distress syndrome: Secondary | ICD-10-CM | POA: Diagnosis present

## 2022-02-13 DIAGNOSIS — E871 Hypo-osmolality and hyponatremia: Secondary | ICD-10-CM | POA: Diagnosis present

## 2022-02-13 DIAGNOSIS — R197 Diarrhea, unspecified: Secondary | ICD-10-CM | POA: Insufficient documentation

## 2022-02-13 DIAGNOSIS — N289 Disorder of kidney and ureter, unspecified: Secondary | ICD-10-CM | POA: Diagnosis present

## 2022-02-13 DIAGNOSIS — Z6841 Body Mass Index (BMI) 40.0 and over, adult: Secondary | ICD-10-CM

## 2022-02-13 DIAGNOSIS — N200 Calculus of kidney: Secondary | ICD-10-CM | POA: Diagnosis present

## 2022-02-13 DIAGNOSIS — R208 Other disturbances of skin sensation: Secondary | ICD-10-CM | POA: Diagnosis not present

## 2022-02-13 DIAGNOSIS — Z87442 Personal history of urinary calculi: Secondary | ICD-10-CM

## 2022-02-13 DIAGNOSIS — Z87440 Personal history of urinary (tract) infections: Secondary | ICD-10-CM

## 2022-02-13 DIAGNOSIS — I10 Essential (primary) hypertension: Secondary | ICD-10-CM | POA: Diagnosis present

## 2022-02-13 DIAGNOSIS — Z83438 Family history of other disorder of lipoprotein metabolism and other lipidemia: Secondary | ICD-10-CM

## 2022-02-13 DIAGNOSIS — E870 Hyperosmolality and hypernatremia: Secondary | ICD-10-CM | POA: Diagnosis not present

## 2022-02-13 DIAGNOSIS — J189 Pneumonia, unspecified organism: Secondary | ICD-10-CM | POA: Diagnosis not present

## 2022-02-13 DIAGNOSIS — R0602 Shortness of breath: Secondary | ICD-10-CM | POA: Diagnosis not present

## 2022-02-13 DIAGNOSIS — M793 Panniculitis, unspecified: Secondary | ICD-10-CM | POA: Diagnosis present

## 2022-02-13 DIAGNOSIS — R9431 Abnormal electrocardiogram [ECG] [EKG]: Secondary | ICD-10-CM | POA: Insufficient documentation

## 2022-02-13 DIAGNOSIS — Z9049 Acquired absence of other specified parts of digestive tract: Secondary | ICD-10-CM

## 2022-02-13 DIAGNOSIS — G8929 Other chronic pain: Secondary | ICD-10-CM | POA: Diagnosis present

## 2022-02-13 DIAGNOSIS — E876 Hypokalemia: Secondary | ICD-10-CM | POA: Diagnosis not present

## 2022-02-13 DIAGNOSIS — R1310 Dysphagia, unspecified: Secondary | ICD-10-CM | POA: Diagnosis not present

## 2022-02-13 DIAGNOSIS — R6889 Other general symptoms and signs: Secondary | ICD-10-CM | POA: Diagnosis not present

## 2022-02-13 DIAGNOSIS — E872 Acidosis, unspecified: Secondary | ICD-10-CM | POA: Diagnosis present

## 2022-02-13 DIAGNOSIS — K760 Fatty (change of) liver, not elsewhere classified: Secondary | ICD-10-CM | POA: Diagnosis present

## 2022-02-13 DIAGNOSIS — R1084 Generalized abdominal pain: Secondary | ICD-10-CM | POA: Diagnosis not present

## 2022-02-13 DIAGNOSIS — Q6239 Other obstructive defects of renal pelvis and ureter: Secondary | ICD-10-CM

## 2022-02-13 DIAGNOSIS — E8729 Other acidosis: Secondary | ICD-10-CM | POA: Insufficient documentation

## 2022-02-13 DIAGNOSIS — Z905 Acquired absence of kidney: Secondary | ICD-10-CM

## 2022-02-13 DIAGNOSIS — F419 Anxiety disorder, unspecified: Secondary | ICD-10-CM | POA: Diagnosis present

## 2022-02-13 DIAGNOSIS — T68XXXA Hypothermia, initial encounter: Secondary | ICD-10-CM | POA: Insufficient documentation

## 2022-02-13 DIAGNOSIS — G928 Other toxic encephalopathy: Secondary | ICD-10-CM | POA: Diagnosis present

## 2022-02-13 DIAGNOSIS — L089 Local infection of the skin and subcutaneous tissue, unspecified: Secondary | ICD-10-CM | POA: Insufficient documentation

## 2022-02-13 DIAGNOSIS — N179 Acute kidney failure, unspecified: Secondary | ICD-10-CM | POA: Diagnosis not present

## 2022-02-13 DIAGNOSIS — B3789 Other sites of candidiasis: Secondary | ICD-10-CM | POA: Diagnosis present

## 2022-02-13 DIAGNOSIS — Z833 Family history of diabetes mellitus: Secondary | ICD-10-CM

## 2022-02-13 DIAGNOSIS — E869 Volume depletion, unspecified: Secondary | ICD-10-CM | POA: Diagnosis present

## 2022-02-13 DIAGNOSIS — E7220 Disorder of urea cycle metabolism, unspecified: Secondary | ICD-10-CM | POA: Diagnosis present

## 2022-02-13 DIAGNOSIS — J101 Influenza due to other identified influenza virus with other respiratory manifestations: Secondary | ICD-10-CM | POA: Insufficient documentation

## 2022-02-13 DIAGNOSIS — Z888 Allergy status to other drugs, medicaments and biological substances status: Secondary | ICD-10-CM

## 2022-02-13 DIAGNOSIS — E662 Morbid (severe) obesity with alveolar hypoventilation: Secondary | ICD-10-CM | POA: Diagnosis present

## 2022-02-13 DIAGNOSIS — S31109D Unspecified open wound of abdominal wall, unspecified quadrant without penetration into peritoneal cavity, subsequent encounter: Secondary | ICD-10-CM | POA: Diagnosis not present

## 2022-02-13 DIAGNOSIS — Z1152 Encounter for screening for COVID-19: Secondary | ICD-10-CM

## 2022-02-13 DIAGNOSIS — R451 Restlessness and agitation: Secondary | ICD-10-CM | POA: Diagnosis not present

## 2022-02-13 DIAGNOSIS — Z9102 Food additives allergy status: Secondary | ICD-10-CM

## 2022-02-13 DIAGNOSIS — J1001 Influenza due to other identified influenza virus with the same other identified influenza virus pneumonia: Secondary | ICD-10-CM | POA: Diagnosis present

## 2022-02-13 DIAGNOSIS — Z881 Allergy status to other antibiotic agents status: Secondary | ICD-10-CM

## 2022-02-13 DIAGNOSIS — B369 Superficial mycosis, unspecified: Secondary | ICD-10-CM | POA: Diagnosis present

## 2022-02-13 DIAGNOSIS — K219 Gastro-esophageal reflux disease without esophagitis: Secondary | ICD-10-CM | POA: Diagnosis present

## 2022-02-13 DIAGNOSIS — J159 Unspecified bacterial pneumonia: Secondary | ICD-10-CM | POA: Diagnosis present

## 2022-02-13 DIAGNOSIS — R6521 Severe sepsis with septic shock: Secondary | ICD-10-CM | POA: Diagnosis present

## 2022-02-13 DIAGNOSIS — Z79899 Other long term (current) drug therapy: Secondary | ICD-10-CM

## 2022-02-13 DIAGNOSIS — F32A Depression, unspecified: Secondary | ICD-10-CM | POA: Diagnosis present

## 2022-02-13 DIAGNOSIS — E162 Hypoglycemia, unspecified: Secondary | ICD-10-CM | POA: Insufficient documentation

## 2022-02-13 DIAGNOSIS — F17210 Nicotine dependence, cigarettes, uncomplicated: Secondary | ICD-10-CM | POA: Diagnosis present

## 2022-02-13 DIAGNOSIS — E66813 Obesity, class 3: Secondary | ICD-10-CM | POA: Diagnosis present

## 2022-02-13 DIAGNOSIS — J188 Other pneumonia, unspecified organism: Secondary | ICD-10-CM | POA: Diagnosis present

## 2022-02-13 LAB — CBC WITH DIFFERENTIAL/PLATELET
Abs Immature Granulocytes: 1.5 10*3/uL — ABNORMAL HIGH (ref 0.00–0.07)
Band Neutrophils: 4 %
Basophils Absolute: 0 10*3/uL (ref 0.0–0.1)
Basophils Relative: 0 %
Eosinophils Absolute: 0 10*3/uL (ref 0.0–0.5)
Eosinophils Relative: 0 %
HCT: 41.2 % (ref 36.0–46.0)
Hemoglobin: 13.2 g/dL (ref 12.0–15.0)
Lymphocytes Relative: 4 %
Lymphs Abs: 1.2 10*3/uL (ref 0.7–4.0)
MCH: 30.6 pg (ref 26.0–34.0)
MCHC: 32 g/dL (ref 30.0–36.0)
MCV: 95.6 fL (ref 80.0–100.0)
Metamyelocytes Relative: 4 %
Monocytes Absolute: 1.2 10*3/uL — ABNORMAL HIGH (ref 0.1–1.0)
Monocytes Relative: 4 %
Myelocytes: 1 %
Neutro Abs: 26.1 10*3/uL — ABNORMAL HIGH (ref 1.7–7.7)
Neutrophils Relative %: 83 %
Platelets: 258 10*3/uL (ref 150–400)
RBC: 4.31 MIL/uL (ref 3.87–5.11)
RDW: 16.2 % — ABNORMAL HIGH (ref 11.5–15.5)
WBC: 30 10*3/uL — ABNORMAL HIGH (ref 4.0–10.5)
nRBC: 0.3 % — ABNORMAL HIGH (ref 0.0–0.2)

## 2022-02-13 LAB — URINALYSIS, ROUTINE W REFLEX MICROSCOPIC
Bilirubin Urine: NEGATIVE
Glucose, UA: 50 mg/dL — AB
Ketones, ur: NEGATIVE mg/dL
Nitrite: NEGATIVE
Protein, ur: >=300 mg/dL — AB
Specific Gravity, Urine: 1.014 (ref 1.005–1.030)
pH: 5 (ref 5.0–8.0)

## 2022-02-13 LAB — BLOOD GAS, ARTERIAL
Acid-base deficit: 22.8 mmol/L — ABNORMAL HIGH (ref 0.0–2.0)
Bicarbonate: 5.3 mmol/L — ABNORMAL LOW (ref 20.0–28.0)
Drawn by: 22223
O2 Saturation: 88.9 %
Patient temperature: 35.6
pCO2 arterial: <18 mmHg — CL (ref 32–48)
pH, Arterial: 7.1 — CL (ref 7.35–7.45)
pO2, Arterial: 51 mmHg — ABNORMAL LOW (ref 83–108)

## 2022-02-13 LAB — RAPID URINE DRUG SCREEN, HOSP PERFORMED
Amphetamines: NOT DETECTED
Barbiturates: NOT DETECTED
Benzodiazepines: NOT DETECTED
Cocaine: NOT DETECTED
Opiates: POSITIVE — AB
Tetrahydrocannabinol: NOT DETECTED

## 2022-02-13 LAB — BASIC METABOLIC PANEL
BUN: 20 mg/dL (ref 6–20)
BUN: 22 mg/dL — ABNORMAL HIGH (ref 6–20)
CO2: <7 mmol/L — ABNORMAL LOW (ref 22–32)
CO2: <7 mmol/L — ABNORMAL LOW (ref 22–32)
Calcium: 7.1 mg/dL — ABNORMAL LOW (ref 8.9–10.3)
Calcium: 6.4 mg/dL — CL (ref 8.9–10.3)
Chloride: 110 mmol/L (ref 98–111)
Chloride: 110 mmol/L (ref 98–111)
Creatinine, Ser: 2.25 mg/dL — ABNORMAL HIGH (ref 0.44–1.00)
Creatinine, Ser: 2.31 mg/dL — ABNORMAL HIGH (ref 0.44–1.00)
GFR, Estimated: 28 mL/min — ABNORMAL LOW (ref >60)
GFR, Estimated: 27 mL/min — ABNORMAL LOW (ref >60)
Glucose, Bld: 90 mg/dL (ref 70–99)
Glucose, Bld: 65 mg/dL — ABNORMAL LOW (ref 70–99)
Potassium: 2.8 mmol/L — ABNORMAL LOW (ref 3.5–5.1)
Potassium: 3.7 mmol/L (ref 3.5–5.1)
Sodium: 133 mmol/L — ABNORMAL LOW (ref 135–145)
Sodium: 132 mmol/L — ABNORMAL LOW (ref 135–145)

## 2022-02-13 LAB — PREGNANCY, URINE: Preg Test, Ur: NEGATIVE

## 2022-02-13 LAB — ETHANOL: Alcohol, Ethyl (B): <10 mg/dL (ref <10)

## 2022-02-13 LAB — RESP PANEL BY RT-PCR (RSV, FLU A&B, COVID)  RVPGX2
Influenza A by PCR: NEGATIVE
Influenza B by PCR: POSITIVE — AB
Resp Syncytial Virus by PCR: NEGATIVE
SARS Coronavirus 2 by RT PCR: NEGATIVE

## 2022-02-13 LAB — LACTIC ACID, PLASMA
Lactic Acid, Venous: 3.2 mmol/L (ref 0.5–1.9)
Lactic Acid, Venous: 1.4 mmol/L (ref 0.5–1.9)

## 2022-02-13 LAB — TROPONIN I (HIGH SENSITIVITY): Troponin I (High Sensitivity): 6 ng/L (ref <18)

## 2022-02-13 LAB — BETA-HYDROXYBUTYRIC ACID: Beta-Hydroxybutyric Acid: 0.09 mmol/L (ref 0.05–0.27)

## 2022-02-13 MED ORDER — SODIUM BICARBONATE 8.4 % IV SOLN
50.0000 meq | Freq: Once | INTRAVENOUS | Status: AC
  Administered 2022-02-13: 50 meq via INTRAVENOUS
  Filled 2022-02-13: qty 50

## 2022-02-13 MED ORDER — ONDANSETRON HCL 4 MG/2ML IJ SOLN
4.0000 mg | Freq: Once | INTRAMUSCULAR | Status: AC
  Administered 2022-02-13: 4 mg via INTRAVENOUS
  Filled 2022-02-13: qty 2

## 2022-02-13 MED ORDER — LACTATED RINGERS IV SOLN: INTRAVENOUS | Status: DC

## 2022-02-13 MED ORDER — HYDROMORPHONE HCL 1 MG/ML IJ SOLN
0.5000 mg | Freq: Once | INTRAMUSCULAR | Status: AC
  Administered 2022-02-13: 0.5 mg via INTRAVENOUS
  Filled 2022-02-13: qty 0.5

## 2022-02-13 MED ORDER — VANCOMYCIN HCL 10 G IV SOLR
2500.0000 mg | Freq: Once | INTRAVENOUS | Status: AC
  Administered 2022-02-13: 2500 mg via INTRAVENOUS
  Filled 2022-02-13: qty 25

## 2022-02-13 MED ORDER — LACTATED RINGERS IV BOLUS
1000.0000 mL | Freq: Once | INTRAVENOUS | Status: AC
  Administered 2022-02-13: 1000 mL via INTRAVENOUS

## 2022-02-13 MED ORDER — SODIUM BICARBONATE 8.4 % IV SOLN: 50.0000 meq | Freq: Once | INTRAVENOUS | Status: DC

## 2022-02-13 MED ORDER — POTASSIUM CHLORIDE 20 MEQ PO PACK
40.0000 meq | PACK | Freq: Once | ORAL | Status: DC
  Filled 2022-02-13: qty 2

## 2022-02-13 MED ORDER — SODIUM CHLORIDE 0.9 % IV SOLN
2.0000 g | Freq: Once | INTRAVENOUS | Status: AC
  Administered 2022-02-13: 2 g via INTRAVENOUS
  Filled 2022-02-13: qty 12.5

## 2022-02-13 MED ORDER — DEXTROSE IN LACTATED RINGERS 5 % IV SOLN: INTRAVENOUS | Status: DC

## 2022-02-13 MED ORDER — SODIUM BICARBONATE 8.4 % IV SOLN
INTRAVENOUS | Status: AC
  Filled 2022-02-13: qty 100

## 2022-02-13 MED ORDER — OSELTAMIVIR PHOSPHATE 75 MG PO CAPS
75.0000 mg | ORAL_CAPSULE | Freq: Once | ORAL | Status: DC
  Filled 2022-02-13: qty 1

## 2022-02-13 MED ORDER — AMOXICILLIN 250 MG PO CAPS
1000.0000 mg | ORAL_CAPSULE | ORAL | Status: AC
  Administered 2022-02-13: 1000 mg via ORAL
  Filled 2022-02-13: qty 4

## 2022-02-13 MED ORDER — DM-GUAIFENESIN ER 30-600 MG PO TB12: 1.0000 | ORAL_TABLET | Freq: Two times a day (BID) | ORAL | Status: DC

## 2022-02-13 MED ORDER — POTASSIUM CHLORIDE 10 MEQ/100ML IV SOLN
10.0000 meq | INTRAVENOUS | Status: AC
  Administered 2022-02-13 (×4): 10 meq via INTRAVENOUS
  Filled 2022-02-13 (×4): qty 100

## 2022-02-13 MED ORDER — SODIUM CHLORIDE 0.45 % IV SOLN
INTRAVENOUS | Status: DC
  Filled 2022-02-13 (×5): qty 75

## 2022-02-13 MED ORDER — VANCOMYCIN VARIABLE DOSE PER UNSTABLE RENAL FUNCTION (PHARMACIST DOSING): Status: DC

## 2022-02-13 MED ORDER — PIPERACILLIN-TAZOBACTAM 3.375 G IVPB 30 MIN
3.3750 g | Freq: Once | INTRAVENOUS | Status: DC
  Administered 2022-02-13: 3.375 g via INTRAVENOUS
  Filled 2022-02-13: qty 50

## 2022-02-13 MED ORDER — OSELTAMIVIR PHOSPHATE 30 MG PO CAPS
30.0000 mg | ORAL_CAPSULE | Freq: Two times a day (BID) | ORAL | Status: DC
  Filled 2022-02-13 (×2): qty 1

## 2022-02-13 MED ORDER — METRONIDAZOLE 500 MG/100ML IV SOLN
500.0000 mg | Freq: Two times a day (BID) | INTRAVENOUS | Status: DC
  Administered 2022-02-13 – 2022-02-15 (×4): 500 mg via INTRAVENOUS
  Filled 2022-02-13 (×4): qty 100

## 2022-02-13 MED ORDER — SODIUM CHLORIDE 0.9 % IV SOLN
2.0000 g | Freq: Two times a day (BID) | INTRAVENOUS | Status: DC
  Administered 2022-02-14 – 2022-02-18 (×10): 2 g via INTRAVENOUS
  Filled 2022-02-13 (×10): qty 12.5

## 2022-02-13 NOTE — Consult Note (Incomplete)
Initial Consultation Note   Patient: Alice [name] YHC:[id number] DOB: 1985/11/06 PCP: Pcp, No DOA: 02/13/2022 DOS: the patient was seen and examined on 02/13/2022 Primary service: Frankey Shown, DO  Referring physician: Dr. Loetta Rough, MD Reason for consult: Sepsis secondary to multifocal pneumonia  Assessment/Plan: Assessment and Plan:  Sepsis secondary to multifocal pneumonia and/or abdominal wound with superimposed panniculitis Patient was hypothermic, tachypneic and presented with leukocytosis with left shift CT chest abdomen and pelvis was suggestive of multifocal pneumonia Patient also presented with chronic abdominal wound with superimposed folliculitis She was started on IV cefepime, vancomycin and Flagyl, continue with these and consider discontinuation/de-escalation based on blood culture Continue IV hydration Continue Mucinex, incentive spirometry, flutter valve  Patient will require ICU admission [facility] at [facility] or [facility].  Patient will continue to be monitored molality of the ED physician, but with [facility] following up with EDP for consult pending bed availability at the end of those campuses.   Influenza B Continue Tamiflu   Hypothermia possibly secondary to above This is possibly secondary to patient's septic state Continue bair hugger   High anion gap metabolic acidosis Beta-hydroxybutyrate calcium was normal Anion gap was not calculated Lactic acidosis could be a differential in the cause of this   Lactic acidosis-resolved Lactic acid - 3.2 > 1.4   Nausea, vomiting and diarrhea Zofran was given Patient has not had diarrhea since arrival to the ED   Hypokalemia K+ 2.8, this was replenished   Hyponatremia Sodium 133, continue IV hydration   Acute kidney injury creatinine 2.25, most recent creatinine for comparison was 3 years ago which range from 0.6 - 0.9). Renally adjust medications, avoid nephrotoxic  agents/dehydration/hypotension   Class III obesity (BMI 54.50) Diet and lifestyle modification  Prolonged QT intervals QTc 528 ms Avoid QT prolonging drugs Magnesium level will be checked Repeat EKG in the morning  Hypoglycemia Continue CBG and hypoglycemic protocol  DVT prophylaxis: Lovenox     [facility] will continue to follow the patient.  HPI: Alice [name] is a 36 y.o. 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  At bedside, patient was awake and only alert to self.  History was obtained from ED physician and ED medical record.  Per report, patient was just activated EMS and arrival of EMS then, she was found on the ground at the apartment.  She reported having nausea, vomiting and mild abdominal pain.  There was limited information about patient at this time since patient was unable to provide more history.  Sisters number on ED medical record and husband's number on the chart were called without being able to reach them.   ED Course:  In the emergency department, temperature was 98.2, but this decreased to 94.107F after arrival to the ED, respiratory rate 25/min, pulse 101 bpm, BP 126/82, O2 sat was 99% on room air.  Workup in the ED showed normal CBC except for with a left shift, BMP showed sodium 133, potassium 2.8, chloride 110, bicarb less than 7, blood glucose 90, BUN 20, creatinine 2.25, most recent creatinine for comparison was 3 years ago which range from 0.6 - 0.9). Pregnancy test was negative, urine drug screen was positive for opiates, lactic acid was 3.2, troponin was 6 and alcohol level was less than 10, urinalysis was unimpressive for UTI.  Influenza A, SARS coronavirus 2, RSV was negative, influenza B was positive. CT head without contrast showed no  acute intracranial abnormality CT chest, abdomen and pelvis without contrast showed lower lobe predominant patchy groundglass airspace opacities at the peripheral peribronchial.  Findings consistent with inflammation (aspiration pneumonia)/infection (COVID 19 not excluded).  Debris within the trachea.  Nonobstructing 3 mm left nephrolithiasis.  Right nephrectomy. Chest x-ray showed new patchy infiltrates are seen in right mid and both lower lung fields suggesting multifocal pneumonia. She was treated with amoxicillin, vancomycin, cefepime, Zofran.  Tamiflu was started and sodium bicarbonate was provided.  Potassium was replenished.  Hospitalist was asked to admit patient for further evaluation and management.   Review of Systems: Review of systems as noted in the HPI. All other systems reviewed and are negative.  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 [name] at [facility]   Morbid obesity [facility])    Preterm labor    Round ligament pain 09/10/2012   Torn ACL    Past Surgical History:  Procedure Laterality Date   addenoidectomy     CESAREAN SECTION N/A 02/22/2013   Procedure: CESAREAN SECTION/TWINS;  Surgeon: [name], MD;  Location: [facility];  Service: Obstetrics;  Laterality: N/A;   CHOLECYSTECTOMY     GALLBLADDER SURGERY  2019   NASAL SINUS SURGERY     NEPHRECTOMY     right side   OTHER SURGICAL HISTORY     ulner nerve removal   TONSILLECTOMY     Social History:  reports that she has been smoking cigarettes. She has a 0.25 pack-year smoking history. She has never used smokeless tobacco. She reports that she does not drink alcohol and does  not use drugs.  Allergies  Allergen Reactions   Cinnamon Anaphylaxis    Cinnamon flavoring    Other Swelling    Artificial cinnamon causes tongue to swell   Reglan [Metoclopramide] Other (See Comments)    Tactile disturbances REACTION: causes skin to "crawl"   Cinnamon Flavor Swelling    Artificial cinnamon causes tongue to swell   Codeine Nausea Only    Straight codeine only Percocet & vicodin are tolerated by pt.   Flomax [Tamsulosin] Other (See Comments)    Other reaction(s): Other (See Comments) Oral sores Oral sores   Ibuprofen Other (See Comments)    Stomach pain Causes severe pain in stomach Pt can tolerate if used sparingly   Levofloxacin Hives   Tylenol [Acetaminophen] Other (See Comments)    Pt reports Tylenol causes her kidney to bleed.    Family History  Problem Relation Age of Onset   Hypertension Mother    Hyperlipidemia Mother    Heart disease Mother    Heart disease Father        MI   Alcohol abuse Father    Alcohol abuse Brother    Diabetes Maternal Grandmother    Hyperlipidemia Maternal Grandmother     Prior to Admission medications   Medication Sig Start Date End Date Taking? Authorizing Provider  cholecalciferol (VITAMIN D3) 25 MCG (1000  UNIT) tablet Take by mouth. 12/20/21  Yes [provider]  clotrimazole-betamethasone (LOTRISONE) cream Apply topically 2 (two) times daily.   Yes [provider]  FLUoxetine (PROZAC) 40 MG capsule Take 1 capsule (40 mg total) by mouth daily. 11/02/18  Yes Rakes, Doralee Albino, FNP  gabapentin (NEURONTIN) 300 MG capsule Take 600 mg by mouth 2 (two) times daily. 08/08/20  Yes [provider]  methocarbamol (ROBAXIN) 500 MG tablet Take by mouth. 01/29/22  Yes [provider]  montelukast (SINGULAIR) 10 MG tablet Take by mouth. 07/30/20  Yes [provider]  nystatin (MYCOSTATIN/NYSTOP) powder Apply 1 Application topically 2 (two) times daily. 01/29/22  Yes [provider]   OLANZapine (ZYPREXA) 10 MG tablet Take 1 tablet (10 mg total) by mouth at bedtime. 01/24/19  Yes Rakes, Doralee Albino, FNP  omeprazole (PRILOSEC) 40 MG capsule Take 1 capsule (40 mg total) by mouth daily. 07/05/19  Yes Deliah Boston F, FNP  phentermine (ADIPEX-P) 37.5 MG tablet Take 37.5 mg by mouth every morning. 09/08/21  Yes [provider]  pravastatin (PRAVACHOL) 80 MG tablet Take by mouth. 08/06/19  Yes [provider]  Sodium Hypochlorite 0.0125 % SOLN Apply topically. 01/29/22  Yes [provider]  SUBOXONE 8-2 MG FILM Place 1 Film under the tongue 2 (two) times daily as needed (pain). 11/16/21  Yes [provider]  SUMAtriptan (IMITREX) 25 MG tablet Take 25 mg by mouth as needed.   Yes [provider]  traZODone (DESYREL) 50 MG tablet Take 2 tablets (100 mg total) by mouth at bedtime as needed. 03/01/19 02/13/22 Yes Rakes, Doralee Albino, FNP  Diethylpropion HCl 25 MG TABS Take 1 tablet by mouth 3 (three) times daily. Patient not taking: Reported on 02/13/2022 11/02/21   [provider]  doxycycline (MONODOX) 100 MG capsule Take 100 mg by mouth every 12 (twelve) hours. Patient not taking: Reported on 02/13/2022 12/20/21   [provider]  gabapentin (NEURONTIN) 400 MG capsule Take by mouth. Patient not taking: Reported on 02/13/2022 11/12/21   [provider]  naloxone [facility]) nasal spray 4 mg/0.1 mL SMARTSIG:1 Spray(s) Both Nares PRN Patient not taking: Reported on 02/13/2022 10/12/21   [provider]  predniSONE (DELTASONE) 5 MG tablet Take by mouth. Patient not taking: Reported on 02/13/2022 09/16/21   [provider]    Physical Exam: Vitals:   02/13/22 2030 02/13/22 2145 02/13/22 2156 02/13/22 2200  BP: 107/67  106/67 96/66  Pulse: (!) 101 (!) 106 (!) 107 (!) 108  Resp: (!) 33 (!) 28 (!) 35 (!) 30  Temp: (!) 95.2 F (35.1 C) (!) 96.6 F (35.9 C) (!) 96.8 F (36 C) (!) 96.8 F (36 C)  TempSrc:       SpO2: 91% 93% 91% 90%  Weight:      Height:       EKG personally reviewed showed sinus tachycardia at rate of 101 bpm with QTc of 528 ms  Family Communication: None at bedside Primary team communication: Care of patient was discussed with EDP Thank you very much for involving [place] in the care of your patient.  Author: Frankey Shown, DO 02/13/2022 10:28 PM  For on call review [email or web address].

## 2022-02-13 NOTE — ED Notes (Signed)
Patient assisted by wheel chair to the bathroom.

## 2022-02-13 NOTE — ED Provider Notes (Signed)
[facility] EMERGENCY DEPARTMENT Provider Note   CSN: [id number] Arrival date & time: 02/13/22  1029     History  Chief Complaint  Patient presents with   Influenza    Alice [name] is a 36 y.o. female.  36 year old female with a history of right nephrectomy, nephrolithiasis, recurrent UTIs, depression and anxiety who presents to the emergency department with positive home flu test and nausea and vomiting and diarrhea.  Patient was reportedly found on the ground at her apartment by EMS.  Reported being thirsty and weak.  Says that she took flu test that was positive recently.  Also says that she has been having some nausea and vomiting and mild abdominal pain.  Initial history limited due to patient vomiting during exam.  Patient denies any recreational substance use or alcohol use.  Says that her sister called 911 and gave several phone numbers to attempt to reach her including [phone] and [phone].  Attempted both of these as well as the phone number listed for her spouse for additional information but was unable to reach them.        Home Medications Prior to Admission medications   Medication Sig Start Date End Date Taking? Authorizing Provider  cholecalciferol (VITAMIN D3) 25 MCG (1000 UNIT) tablet Take by mouth. 12/20/21  Yes [provider]  clotrimazole-betamethasone (LOTRISONE) cream Apply topically 2 (two) times daily.   Yes [provider]  FLUoxetine (PROZAC) 40 MG capsule Take 1 capsule (40 mg total) by mouth daily. 11/02/18  Yes Rakes, Doralee Albino, FNP  gabapentin (NEURONTIN) 300 MG capsule Take 600 mg by mouth 2 (two) times daily. 08/08/20  Yes [provider]  methocarbamol (ROBAXIN) 500 MG tablet Take by mouth. 01/29/22  Yes [provider]  montelukast (SINGULAIR) 10 MG tablet Take by mouth. 07/30/20  Yes [provider]  nystatin (MYCOSTATIN/NYSTOP) powder Apply 1 Application topically 2 (two) times daily. 01/29/22   Yes [provider]  OLANZapine (ZYPREXA) 10 MG tablet Take 1 tablet (10 mg total) by mouth at bedtime. 01/24/19  Yes Rakes, Doralee Albino, FNP  omeprazole (PRILOSEC) 40 MG capsule Take 1 capsule (40 mg total) by mouth daily. 07/05/19  Yes Deliah Boston F, FNP  phentermine (ADIPEX-P) 37.5 MG tablet Take 37.5 mg by mouth every morning. 09/08/21  Yes [provider]  pravastatin (PRAVACHOL) 80 MG tablet Take by mouth. 08/06/19  Yes [provider]  Sodium Hypochlorite 0.0125 % SOLN Apply topically. 01/29/22  Yes [provider]  SUBOXONE 8-2 MG FILM Place 1 Film under the tongue 2 (two) times daily as needed (pain). 11/16/21  Yes [provider]  SUMAtriptan (IMITREX) 25 MG tablet Take 25 mg by mouth as needed.   Yes [provider]  traZODone (DESYREL) 50 MG tablet Take 2 tablets (100 mg total) by mouth at bedtime as needed. 03/01/19 02/13/22 Yes Rakes, Doralee Albino, FNP  Diethylpropion HCl 25 MG TABS Take 1 tablet by mouth 3 (three) times daily. Patient not taking: Reported on 02/13/2022 11/02/21   [provider]  doxycycline (MONODOX) 100 MG capsule Take 100 mg by mouth every 12 (twelve) hours. Patient not taking: Reported on 02/13/2022 12/20/21   [provider]  gabapentin (NEURONTIN) 400 MG capsule Take by mouth. Patient not taking: Reported on 02/13/2022 11/12/21   [provider]  naloxone [facility]) nasal spray 4 mg/0.1 mL SMARTSIG:1 Spray(s) Both Nares PRN Patient not taking: Reported on 02/13/2022 10/12/21   [provider]  predniSONE (DELTASONE)  5 MG tablet Take by mouth. Patient not taking: Reported on 02/13/2022 09/16/21   [provider]      Allergies    Cinnamon, Other, Reglan [metoclopramide], Cinnamon flavor, Codeine, Flomax [tamsulosin], Ibuprofen, Levofloxacin, and Tylenol [acetaminophen]    Review of Systems   Review of Systems  Physical Exam Updated Vital Signs BP 97/73   Pulse 98   Temp  (!) 94.6 F (34.8 C) (Rectal)   Resp (!) 24   Ht 5\' 9"  (1.753 m)   Wt (!) 167.4 kg   LMP  (LMP Unknown)   SpO2 99%   BMI 54.50 kg/m  Physical Exam Vitals and nursing note reviewed.  Constitutional:      Appearance: She is well-developed.     Comments: Uncomfortable appearing.  Retching during exam.  HENT:     Head: Normocephalic and atraumatic.     Right Ear: External ear normal.     Left Ear: External ear normal.     Nose: Nose normal.  Eyes:     Extraocular Movements: Extraocular movements intact.     Conjunctiva/sclera: Conjunctivae normal.     Pupils: Pupils are equal, round, and reactive to light.  Cardiovascular:     Rate and Rhythm: Normal rate and regular rhythm.     Heart sounds: No murmur heard. Pulmonary:     Effort: Pulmonary effort is normal. No respiratory distress.     Breath sounds: Normal breath sounds.  Abdominal:     General: Abdomen is flat. There is no distension.     Palpations: Abdomen is soft. There is no mass.     Tenderness: There is abdominal tenderness (Diffuse). There is no guarding.  Musculoskeletal:     Cervical back: Normal range of motion and neck supple.  Skin:    General: Skin is warm and dry.  Neurological:     Mental Status: She is alert.     Comments: Patient moving all 4 extremities.  Answering questions appropriately.  Psychiatric:        Mood and Affect: Mood normal.     ED Results / Procedures / Treatments   Labs (all labs ordered are listed, but only abnormal results are displayed) Labs Reviewed  BASIC METABOLIC PANEL - Abnormal; Notable for the following components:      Result Value   Sodium 133 (*)    Potassium 2.8 (*)    CO2 <7 (*)    Creatinine, Ser 2.25 (*)    Calcium 7.1 (*)    GFR, Estimated 28 (*)    All other components within normal limits  CBC WITH DIFFERENTIAL/PLATELET - Abnormal; Notable for the following components:   WBC 30.0 (*)    RDW 16.2 (*)    nRBC 0.3 (*)    Neutro Abs 26.1 (*)    Monocytes  Absolute 1.2 (*)    Abs Immature Granulocytes 1.50 (*)    All other components within normal limits  RESP PANEL BY RT-PCR (RSV, FLU A&B, COVID)  RVPGX2  ETHANOL  PREGNANCY, URINE  RAPID URINE DRUG SCREEN, HOSP PERFORMED  CBG MONITORING, ED  TROPONIN I (HIGH SENSITIVITY)    EKG None  Radiology DG Chest 1 View  Result Date: 02/13/2022 CLINICAL DATA:  Shortness of breath, cough EXAM: CHEST  1 VIEW COMPARISON:  Previous studies including chest radiograph done on 11/03/2015 and CT done on 03/30/2017 FINDINGS: There is poor inspiration. New patchy infiltrates are seen in right parahilar region and both lower lung fields. Lateral CP angles are  clear. There is no pneumothorax. Patient's chin is partly obscuring both apices. IMPRESSION: New patchy infiltrates are seen in right mid and both lower lung fields suggesting multifocal pneumonia. Electronically Signed   By: Ernie AvenaPalani  Rathinasamy M.D.   On: 02/13/2022 16:16    Procedures Procedures   Medications Ordered in ED Medications  potassium chloride 10 mEq in 100 mL IVPB (has no administration in time range)  potassium chloride (KLOR-CON) packet 40 mEq (has no administration in time range)  HYDROmorphone (DILAUDID) injection 0.5 mg (has no administration in time range)  ondansetron (ZOFRAN) injection 4 mg (4 mg Intravenous Given 02/13/22 1639)  lactated ringers bolus 1,000 mL (1,000 mLs Intravenous New Bag/Given 02/13/22 1640)  amoxicillin (AMOXIL) capsule 1,000 mg (1,000 mg Oral Given 02/13/22 1640)    ED Course/ Medical Decision Making/ A&P Clinical Course as of 02/13/22 1737  Sun Feb 13, 2022  1659 WBC(!): 30.0 [HN]  1659 DG Chest 1 View New patchy infiltrates are seen in right mid and both lower lung fields suggesting multifocal pneumonia.   [HN]  1703 Signed out to Dr Jearld FentonNaasz [RP]  1706 Rectal temp 24F [HN]  1729 Creatinine(!): 2.25 AKI [HN]  1729 Potassium(!): 2.8 Will replete [HN]    Clinical Course User Index [HN] Loetta RoughNaasz,  Hayley N, MD [RP] Rondel BatonPaterson, Charity Tessier C, MD                           Medical Decision Making Amount and/or Complexity of Data Reviewed Labs: ordered. Radiology: ordered. ECG/medicine tests: ordered.  Risk Prescription drug management.   Shelia MediaBrittany L Crouse is a 36 y.o. female with comorbidities that complicate the patient evaluation including  right nephrectomy, nephrolithiasis, recurrent UTIs, depression and anxiety who presents to the emergency department with positive home flu test and nausea and vomiting and diarrhea   Initial Ddx:  Influenza, pneumonia, gastroenteritis, cannabinoid hyperemesis syndrome, intoxication, AKI, UTI  MDM:  Patient history limited due to her retching and I feel also slightly due to her behavior.  Concern for possible intoxication.  Will attempt to reach family to gain additional history.  Likely that patient is having symptoms from gastroenteritis or influenza so we will treat symptomatically at this time.  Will also obtain lab work since she is at high risk for AKI given her solitary kidney.  Considered cannabinoid hyperemesis syndrome and intoxication but patient denies substance use.  However, will obtain ethanol and urine drug screen along with urinalysis to assess for possible substance use as well as UTI.  Will consider additional imaging if we are able to get in touch with the patient's family.  Plan:  Labs COVID/flu Urine drug test Urinalysis Pregnancy test Chest x-ray Zofran IV fluids  ED Summary/Re-evaluation:  Patient underwent the following workup which showed that she may have a multifocal pneumonia.  On chart review appears that she has had this before.  Was satting well on room air and was given amoxicillin in case the patient was able to be discharged home.  However, labs returned and showed a white blood cell count of 30 with an AKI.  At the change of shift nursing reports that she has a large wound which will be evaluated by the oncoming  doctor to see if it is infected.  Given the limited additional history did add on a head CT as well as abdominal CT to evaluate for possible intra-abdominal infectious process that would be causing her nausea and vomiting and diarrhea.  With these recurrent multifocal pneumonias will obtain CT of the chest as well.  This patient presents to the ED for concern of complaints listed in HPI, this involves an extensive number of treatment options, and is a complaint that carries with it a high risk of complications and morbidity. Disposition including potential need for admission considered.   Dispo: Pending remainder of workup  Additional history obtained from EMS Records reviewed Admission Notes The following labs were independently interpreted: Chemistry and CBC and show AKI and leukocytosis I independently reviewed the following imaging with scope of interpretation limited to determining acute life threatening conditions related to emergency care: Chest x-ray and agree with the radiologist interpretation with the following exceptions: None I personally reviewed and interpreted cardiac monitoring: normal sinus rhythm   Final Clinical Impression(s) / ED Diagnoses Final diagnoses:  Nausea vomiting and diarrhea  Flu-like symptoms  AKI (acute kidney injury) (HCC)  Generalized abdominal pain    Rx / DC Orders ED Discharge Orders     None         Rondel Baton, MD 02/13/22 1737

## 2022-02-13 NOTE — Progress Notes (Signed)
Pharmacy Antibiotic Note  Alice [name] is a 36 y.o. female admitted on 02/13/2022 with sepsis from pneumonia and panniculitis.  Pharmacy has been consulted for vancomycin and cefepime dosing; also on metronidazole.  AKI noted, SCr 2.25 (baseline 0.71 on 12/16), also with solitary kidney s/p right nephrectomy. She is afebrile, WBC are elevated, and blood cultures are pending.  Plan: Vancomycin 2500 mg IV load  Check random level ~24h to guide further dosing Cefepime 2 g IV q12h Metronidazole 500 mg IV q12h Monitor renal function, clinical progress, cultures/sensitivities F/U LOT and de-escalate as able Vancomycin levels as clinically indicated    Height: 5\' 9"  (175.3 cm) Weight: (!) 167.4 kg (369 lb 0.8 oz) IBW/kg (Calculated) : 66.2  Temp (24hrs), Avg:95.8 F (35.4 C), Min:94.5 F (34.7 C), Max:98.2 F (36.8 C)  Recent Labs  Lab 02/13/22 1620  WBC 30.0*  CREATININE 2.25*    Estimated Creatinine Clearance: 57.7 mL/min (A) (by C-G formula based on SCr of 2.25 mg/dL (H)).    Allergies  Allergen Reactions   Cinnamon Anaphylaxis    Cinnamon flavoring    Other Swelling    Artificial cinnamon causes tongue to swell   Reglan [Metoclopramide] Other (See Comments)    Tactile disturbances REACTION: causes skin to "crawl"   Cinnamon Flavor Swelling    Artificial cinnamon causes tongue to swell   Codeine Nausea Only    Straight codeine only Percocet & vicodin are tolerated by pt.   Flomax [Tamsulosin] Other (See Comments)    Other reaction(s): Other (See Comments) Oral sores Oral sores   Ibuprofen Other (See Comments)    Stomach pain Causes severe pain in stomach Pt can tolerate if used sparingly   Levofloxacin Hives   Tylenol [Acetaminophen] Other (See Comments)    Pt reports Tylenol causes her kidney to bleed.    Antimicrobials this admission: amoxicillin 12/31 x1 cefepime 12/31 >>  Zosyn 12/31 x1 metronidazole 12/31 >>   Dose adjustments this  admission:   Microbiology results: 12/31 BCx:  12/31 influenza B+  Thank you for involving pharmacy in this patient's care.  1/32, PharmD, BCPS Clinical Pharmacist Clinical phone for 02/13/2022 is [phone] 02/13/2022 6:32 PM

## 2022-02-13 NOTE — ED Notes (Signed)
Patient transported to CT 

## 2022-02-13 NOTE — ED Triage Notes (Signed)
Pt reports diagnosed with flu several days ago and does not feel like she is getting better.  Pt has diarrhea and feels very thirsty and weak, denies cough.

## 2022-02-13 NOTE — ED Provider Notes (Signed)
5:00 PM Assumed care of patient from off-going team. For more details, please see note from same day.  In brief, this is a 36 y.o. female with obesity, migraine, single kidney, chronic cholecystitis, GERD, mood disorder, recurrent UTI who p/w influenza A, acting strangely, nausea/vomiting/diarrhea, generalized weakness. CXR shows multifocal pneumonia. WBC 30. Getting amoxicillin, fluids, zofran. Tried to contact sister and spouse but couldn't talk to anyone.   Plan/Dispo at time of sign-out & ED Course since sign-out: [ ]  reassess, labs, CT abdomen pelvis.  BP 97/73   Pulse 98   Temp 98.2 F (36.8 C)   Resp (!) 24   Ht 5\' 9"  (1.753 m)   Wt (!) 167.4 kg   LMP  (LMP Unknown)   SpO2 99%   BMI 54.50 kg/m    ED Course:   Clinical Course as of 02/13/22 2327  Sun Feb 13, 2022  1659 WBC(!): 30.0 [HN]  1659 DG Chest 1 View New patchy infiltrates are seen in right mid and both lower lung fields suggesting multifocal pneumonia.   [HN]  1703 Signed out to Dr Mayra Neer [RP]  1706 Rectal temp 92F, on bair hugger [HN]  1729 Creatinine(!): 2.25 AKI [HN]  1729 Potassium(!): 2.8 Will replete [HN]  [id number] Wounds of abdomen/inguinal region are severe and open with ulceration. Per chart review, patient had been seen and was admitted at [facility] in October for similar presentation w/ sepsis d/t gram negative pneumonia and had abdominal cellulitis at that time thought to be due to severe candida intertrigo. Per care everywhere documentation: "Bilateral gram-negative pneumonia with sepsis:  Panniculitis with severe Candida intertrigo She was placed on vancomycin and cefepime to cover both pneumonia and the panniculitis. She received fluconazole for intertrigo and seen by the wound care nurse for local wound dressing . Since being on antibiotics, she feels a lot better. Panniculitis is improved. " [HN]  1826 With tachycardia, tachypnea, WBC 30, hypothermia, and suspected source(s) of infection (pneumonia vs  abd wall cellulitis) will consider septic. Blood cultures drawn, giving 2nd liter of fluid. [HN]  1827 Influenza B By PCR(!): POSITIVE [HN]  1905 Had ordered zosyn, pharmacy recommending switching to cefepime flagyl given renal function [HN]  1905 CT CHEST ABDOMEN PELVIS WO CONTRAST 1. Lower lobe predominant patchy ground-glass airspace opacities that are peripheral and peribronchovascular. Findings consistent inflammation (aspiration pneumonia)/infection (COVID-19 not excluded). Recommend CT chest in 3 months to evaluate for resolution. 2. Debris within the trachea. 3. Nonobstructive 3 mm left nephrolithiasis. 4. Right nephrectomy. 5. T-shaped intrauterine device in appropriate position within the uterus.   [HN]  1906 CT Head Wo Contrast No acute intracranial abnormality. [HN]  1938 CO2(!): <7 Will give bicarb 50 mEq. AG not calculable. Likely a metabolic acidosis, no h/o diabetes, no report of EtOH.  Admitted to hospitalist. [HN]  1938 Hospitalist requesting tamiflu [HN]  2047 Patient tachypneic. Starting on bicarb gtt. D/t hospitalist and will consult to critical care for transfer to ICU. [HN]  2135 Discussed extensively with the critical care physician at [facility].  He stated that there are no beds available at [facility] or [facility] and patient would likely sit and await a bed.  His recommendation would be to admit with the hospitalist and continue to monitor her status.  Will discuss with the hospitalist. [HN]  2135 pH, Arterial(!!): 7.1 [HN]  2135 pCO2 arterial(!!): <18 [HN]  2222 On maintenance fluids and bicarb gtt. Hospitalist will assist in management overnight. [HN]  2223 Lactic Acid, Venous: 1.4  Lactate downtrending [HN]  2256 Satting 85% on [place]. Starting BiPAP.  [HN]  2326 Would like to avoid intubation for this patient if possible d/t severe acidosis. Patient is likely compensating with respiratory drive for her severe metabolic acidosis and I believe acidosis may worsen if she  is intubated/ventilated. Will trial patient on BiPAP [HN]    Clinical Course User Index [HN] Loetta Rough, MD [RP] Rondel Baton, MD       Dispo: Admitted to medicine ------------------------------- Vivi Barrack, MD Emergency Medicine  This note was created using dictation software, which may contain spelling or grammatical errors.   Loetta Rough, MD 02/13/22 [phone]

## 2022-02-13 NOTE — ED Triage Notes (Signed)
Pt brought in by [facility] for c/o flu sx; pt states she is weak and was found in floor by ems when they arrived; pt had incontinence of bowel and feces was found in floor by ems  Pt 99% on RA but did desat to 93% when standing P-99 BP 128/92  Pt was diagnosed with flu a couple of days ago

## 2022-02-13 NOTE — H&P (Signed)
History and Physical    Patient: Alice [name] WUX:[id number] DOB: 1985/12/29 DOA: 02/13/2022 DOS: the patient was seen and examined on 02/13/2022 PCP: Pcp, No  Patient coming from: {Point_of_Origin:26777}  Chief Complaint:  Chief Complaint  Patient presents with   Influenza   HPI: Alice [name] is a 36 y.o. 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 [name] at [facility]   Morbid obesity [facility])    Preterm labor    Round ligament pain 09/10/2012   Torn ACL    Past Surgical History:  Procedure Laterality Date   addenoidectomy     CESAREAN SECTION N/A 02/22/2013   Procedure: CESAREAN SECTION/TWINS;  Surgeon: [name], MD;  Location: [facility]S;  Service: Obstetrics;  Laterality: N/A;   CHOLECYSTECTOMY     GALLBLADDER SURGERY  2019   NASAL SINUS SURGERY     NEPHRECTOMY     right side   OTHER SURGICAL HISTORY     ulner nerve removal   TONSILLECTOMY     Social History:  reports that she has been smoking cigarettes. She has a 0.25 pack-year smoking history. She has never used smokeless tobacco. She reports that she does not drink alcohol and does not use drugs.  Allergies  Allergen Reactions   Cinnamon Anaphylaxis    Cinnamon flavoring    Other Swelling    Artificial cinnamon causes tongue to swell   Reglan [Metoclopramide] Other (See Comments)    Tactile disturbances REACTION: causes skin to "crawl"   Cinnamon Flavor Swelling    Artificial cinnamon causes tongue to swell   Codeine Nausea Only    Straight codeine only Percocet & vicodin  are tolerated by pt.   Flomax [Tamsulosin] Other (See Comments)    Other reaction(s): Other (See Comments) Oral sores Oral sores   Ibuprofen Other (See Comments)    Stomach pain Causes severe pain in stomach Pt can tolerate if used sparingly   Levofloxacin Hives   Tylenol [Acetaminophen] Other (See Comments)    Pt reports Tylenol causes her kidney to bleed.    Family History  Problem Relation Age of Onset   Hypertension Mother    Hyperlipidemia Mother    Heart disease Mother    Heart disease Father        MI   Alcohol abuse Father    Alcohol abuse Brother    Diabetes Maternal Grandmother    Hyperlipidemia Maternal Grandmother     Prior to Admission medications   Medication Sig Start Date End Date Taking? Authorizing Provider  cholecalciferol (VITAMIN D3) 25 MCG (1000 UNIT) tablet Take by mouth. 12/20/21  Yes [provider]  clotrimazole-betamethasone (LOTRISONE) cream Apply topically 2 (two) times daily.   Yes [provider]  FLUoxetine (PROZAC) 40 MG capsule Take 1 capsule (40 mg total) by mouth daily. 11/02/18  Yes Rakes, Doralee Albino, FNP  gabapentin (NEURONTIN) 300 MG capsule Take 600 mg by mouth 2 (two) times daily. 08/08/20  Yes [provider]  methocarbamol (  ROBAXIN) 500 MG tablet Take by mouth. 01/29/22  Yes [provider]  montelukast (SINGULAIR) 10 MG tablet Take by mouth. 07/30/20  Yes [provider]  nystatin (MYCOSTATIN/NYSTOP) powder Apply 1 Application topically 2 (two) times daily. 01/29/22  Yes [provider]  OLANZapine (ZYPREXA) 10 MG tablet Take 1 tablet (10 mg total) by mouth at bedtime. 01/24/19  Yes Rakes, Doralee Albino, FNP  omeprazole (PRILOSEC) 40 MG capsule Take 1 capsule (40 mg total) by mouth daily. 07/05/19  Yes Deliah Boston F, FNP  phentermine (ADIPEX-P) 37.5 MG tablet Take 37.5 mg by mouth every morning. 09/08/21  Yes [provider]  pravastatin (PRAVACHOL) 80 MG tablet Take by mouth. 08/06/19   Yes [provider]  Sodium Hypochlorite 0.0125 % SOLN Apply topically. 01/29/22  Yes [provider]  SUBOXONE 8-2 MG FILM Place 1 Film under the tongue 2 (two) times daily as needed (pain). 11/16/21  Yes [provider]  SUMAtriptan (IMITREX) 25 MG tablet Take 25 mg by mouth as needed.   Yes [provider]  traZODone (DESYREL) 50 MG tablet Take 2 tablets (100 mg total) by mouth at bedtime as needed. 03/01/19 02/13/22 Yes Rakes, Doralee Albino, FNP  Diethylpropion HCl 25 MG TABS Take 1 tablet by mouth 3 (three) times daily. Patient not taking: Reported on 02/13/2022 11/02/21   [provider]  doxycycline (MONODOX) 100 MG capsule Take 100 mg by mouth every 12 (twelve) hours. Patient not taking: Reported on 02/13/2022 12/20/21   [provider]  gabapentin (NEURONTIN) 400 MG capsule Take by mouth. Patient not taking: Reported on 02/13/2022 11/12/21   [provider]  naloxone [facility]) nasal spray 4 mg/0.1 mL SMARTSIG:1 Spray(s) Both Nares PRN Patient not taking: Reported on 02/13/2022 10/12/21   [provider]  predniSONE (DELTASONE) 5 MG tablet Take by mouth. Patient not taking: Reported on 02/13/2022 09/16/21   [provider]    Physical Exam: Vitals:   02/13/22 2230 02/13/22 2245 02/13/22 2300 02/13/22 2305  BP: 90/60 90/62 (!) 86/58   Pulse: (!) 114 (!) 113 (!) 108 (!) 107  Resp: (!) 39 (!) 33 (!) 31 (!) 33  Temp: (!) 97.5 F (36.4 C) 97.7 F (36.5 C) 98.1 F (36.7 C)   TempSrc:      SpO2: 91% (!) 85% 100% 100%  Weight:      Height:       *** Data Reviewed: {Tip this will not be part of the note when signed- Document your independent interpretation of telemetry tracing, EKG, lab, Radiology test or any other diagnostic tests. Add any new diagnostic test ordered today. (Optional):26781} {Results:26384}  Assessment and Plan: No notes have been filed under this hospital service. Service: Hospitalist      Advance Care Planning:   Code Status: Full Code ***  Consults: ***  Family Communication: ***  Severity of Illness: {Observation/Inpatient:21159}  Author: Frankey Shown, DO 02/13/2022 11:58 PM  For on call review [email or web address].

## 2022-02-14 ENCOUNTER — Inpatient Hospital Stay (HOSPITAL_COMMUNITY): Payer: Medicaid Other

## 2022-02-14 DIAGNOSIS — G9341 Metabolic encephalopathy: Secondary | ICD-10-CM | POA: Diagnosis not present

## 2022-02-14 DIAGNOSIS — A419 Sepsis, unspecified organism: Secondary | ICD-10-CM | POA: Insufficient documentation

## 2022-02-14 DIAGNOSIS — R197 Diarrhea, unspecified: Secondary | ICD-10-CM | POA: Insufficient documentation

## 2022-02-14 DIAGNOSIS — J9601 Acute respiratory failure with hypoxia: Secondary | ICD-10-CM | POA: Diagnosis not present

## 2022-02-14 DIAGNOSIS — L089 Local infection of the skin and subcutaneous tissue, unspecified: Secondary | ICD-10-CM | POA: Insufficient documentation

## 2022-02-14 DIAGNOSIS — R208 Other disturbances of skin sensation: Secondary | ICD-10-CM

## 2022-02-14 DIAGNOSIS — E8729 Other acidosis: Secondary | ICD-10-CM | POA: Insufficient documentation

## 2022-02-14 DIAGNOSIS — J101 Influenza due to other identified influenza virus with other respiratory manifestations: Secondary | ICD-10-CM | POA: Insufficient documentation

## 2022-02-14 DIAGNOSIS — J189 Pneumonia, unspecified organism: Secondary | ICD-10-CM | POA: Diagnosis not present

## 2022-02-14 DIAGNOSIS — E872 Acidosis, unspecified: Secondary | ICD-10-CM | POA: Insufficient documentation

## 2022-02-14 DIAGNOSIS — E871 Hypo-osmolality and hyponatremia: Secondary | ICD-10-CM | POA: Insufficient documentation

## 2022-02-14 DIAGNOSIS — E876 Hypokalemia: Secondary | ICD-10-CM | POA: Insufficient documentation

## 2022-02-14 DIAGNOSIS — R112 Nausea with vomiting, unspecified: Secondary | ICD-10-CM | POA: Insufficient documentation

## 2022-02-14 DIAGNOSIS — T68XXXA Hypothermia, initial encounter: Secondary | ICD-10-CM | POA: Insufficient documentation

## 2022-02-14 DIAGNOSIS — R9431 Abnormal electrocardiogram [ECG] [EKG]: Secondary | ICD-10-CM | POA: Insufficient documentation

## 2022-02-14 DIAGNOSIS — E162 Hypoglycemia, unspecified: Secondary | ICD-10-CM | POA: Insufficient documentation

## 2022-02-14 DIAGNOSIS — N179 Acute kidney failure, unspecified: Secondary | ICD-10-CM | POA: Insufficient documentation

## 2022-02-14 LAB — BLOOD GAS, ARTERIAL
Acid-base deficit: 23.9 mmol/L — ABNORMAL HIGH (ref 0.0–2.0)
Acid-base deficit: 21.1 mmol/L — ABNORMAL HIGH (ref 0.0–2.0)
Acid-base deficit: 16.7 mmol/L — ABNORMAL HIGH (ref 0.0–2.0)
Acid-base deficit: 16.3 mmol/L — ABNORMAL HIGH (ref 0.0–2.0)
Bicarbonate: 5.8 mmol/L — ABNORMAL LOW (ref 20.0–28.0)
Bicarbonate: 8.3 mmol/L — ABNORMAL LOW (ref 20.0–28.0)
Bicarbonate: 9.9 mmol/L — ABNORMAL LOW (ref 20.0–28.0)
Bicarbonate: 11.6 mmol/L — ABNORMAL LOW (ref 20.0–28.0)
Drawn by: 22223
Drawn by: 27016
FIO2: 80 %
O2 Saturation: 99.6 %
O2 Saturation: 96.9 %
O2 Saturation: 97.5 %
O2 Saturation: 99.9 %
Patient temperature: 37.4
Patient temperature: 37.3
Patient temperature: 37.5
Patient temperature: 37.1
pCO2 arterial: 23 mmHg — ABNORMAL LOW (ref 32–48)
pCO2 arterial: 30 mmHg — ABNORMAL LOW (ref 32–48)
pCO2 arterial: 27 mmHg — ABNORMAL LOW (ref 32–48)
pCO2 arterial: 34 mmHg (ref 32–48)
pH, Arterial: 7.01 — CL (ref 7.35–7.45)
pH, Arterial: 7.05 — CL (ref 7.35–7.45)
pH, Arterial: 7.18 — CL (ref 7.35–7.45)
pH, Arterial: 7.14 — CL (ref 7.35–7.45)
pO2, Arterial: 209 mmHg — ABNORMAL HIGH (ref 83–108)
pO2, Arterial: 81 mmHg — ABNORMAL LOW (ref 83–108)
pO2, Arterial: 82 mmHg — ABNORMAL LOW (ref 83–108)
pO2, Arterial: 212 mmHg — ABNORMAL HIGH (ref 83–108)

## 2022-02-14 LAB — COMPREHENSIVE METABOLIC PANEL
ALT: 13 U/L (ref 0–44)
AST: 46 U/L — ABNORMAL HIGH (ref 15–41)
Albumin: 2 g/dL — ABNORMAL LOW (ref 3.5–5.0)
Alkaline Phosphatase: 131 U/L — ABNORMAL HIGH (ref 38–126)
Anion gap: 16 — ABNORMAL HIGH (ref 5–15)
BUN: 27 mg/dL — ABNORMAL HIGH (ref 6–20)
CO2: 8 mmol/L — ABNORMAL LOW (ref 22–32)
Calcium: 6 mg/dL — CL (ref 8.9–10.3)
Chloride: 111 mmol/L (ref 98–111)
Creatinine, Ser: 2.46 mg/dL — ABNORMAL HIGH (ref 0.44–1.00)
GFR, Estimated: 25 mL/min — ABNORMAL LOW (ref >60)
Glucose, Bld: 105 mg/dL — ABNORMAL HIGH (ref 70–99)
Potassium: 3 mmol/L — ABNORMAL LOW (ref 3.5–5.1)
Sodium: 135 mmol/L (ref 135–145)
Total Bilirubin: 0.7 mg/dL (ref 0.3–1.2)
Total Protein: 5 g/dL — ABNORMAL LOW (ref 6.5–8.1)

## 2022-02-14 LAB — PHOSPHORUS: Phosphorus: 4.7 mg/dL — ABNORMAL HIGH (ref 2.5–4.6)

## 2022-02-14 LAB — CBC
HCT: 33.7 % — ABNORMAL LOW (ref 36.0–46.0)
HCT: 31.8 % — ABNORMAL LOW (ref 36.0–46.0)
Hemoglobin: 11 g/dL — ABNORMAL LOW (ref 12.0–15.0)
Hemoglobin: 10.9 g/dL — ABNORMAL LOW (ref 12.0–15.0)
MCH: 30.4 pg (ref 26.0–34.0)
MCH: 30.6 pg (ref 26.0–34.0)
MCHC: 32.6 g/dL (ref 30.0–36.0)
MCHC: 34.3 g/dL (ref 30.0–36.0)
MCV: 93.1 fL (ref 80.0–100.0)
MCV: 89.3 fL (ref 80.0–100.0)
Platelets: 195 10*3/uL (ref 150–400)
Platelets: 219 K/uL (ref 150–400)
RBC: 3.62 MIL/uL — ABNORMAL LOW (ref 3.87–5.11)
RBC: 3.56 MIL/uL — ABNORMAL LOW (ref 3.87–5.11)
RDW: 15.9 % — ABNORMAL HIGH (ref 11.5–15.5)
RDW: 15.7 % — ABNORMAL HIGH (ref 11.5–15.5)
WBC: 18.7 10*3/uL — ABNORMAL HIGH (ref 4.0–10.5)
WBC: 19.2 K/uL — ABNORMAL HIGH (ref 4.0–10.5)
nRBC: 0.5 % — ABNORMAL HIGH (ref 0.0–0.2)
nRBC: 0.4 % — ABNORMAL HIGH (ref 0.0–0.2)

## 2022-02-14 LAB — BASIC METABOLIC PANEL
Anion gap: 16 — ABNORMAL HIGH (ref 5–15)
BUN: 36 mg/dL — ABNORMAL HIGH (ref 6–20)
CO2: 12 mmol/L — ABNORMAL LOW (ref 22–32)
Calcium: 6.4 mg/dL — CL (ref 8.9–10.3)
Chloride: 108 mmol/L (ref 98–111)
Creatinine, Ser: 2.88 mg/dL — ABNORMAL HIGH (ref 0.44–1.00)
GFR, Estimated: 21 mL/min — ABNORMAL LOW (ref >60)
Glucose, Bld: 132 mg/dL — ABNORMAL HIGH (ref 70–99)
Potassium: 2.3 mmol/L — CL (ref 3.5–5.1)
Sodium: 136 mmol/L (ref 135–145)

## 2022-02-14 LAB — PROCALCITONIN: Procalcitonin: 1.47 ng/mL

## 2022-02-14 LAB — COMPREHENSIVE METABOLIC PANEL WITH GFR
ALT: 14 U/L (ref 0–44)
AST: 42 U/L — ABNORMAL HIGH (ref 15–41)
Albumin: 2 g/dL — ABNORMAL LOW (ref 3.5–5.0)
Alkaline Phosphatase: 142 U/L — ABNORMAL HIGH (ref 38–126)
Anion gap: 18 — ABNORMAL HIGH (ref 5–15)
BUN: 32 mg/dL — ABNORMAL HIGH (ref 6–20)
CO2: 9 mmol/L — ABNORMAL LOW (ref 22–32)
Calcium: 5.8 mg/dL — CL (ref 8.9–10.3)
Chloride: 109 mmol/L (ref 98–111)
Creatinine, Ser: 2.78 mg/dL — ABNORMAL HIGH (ref 0.44–1.00)
GFR, Estimated: 22 mL/min — ABNORMAL LOW
Glucose, Bld: 137 mg/dL — ABNORMAL HIGH (ref 70–99)
Potassium: 2.2 mmol/L — CL (ref 3.5–5.1)
Sodium: 136 mmol/L (ref 135–145)
Total Bilirubin: 0.4 mg/dL (ref 0.3–1.2)
Total Protein: 5.2 g/dL — ABNORMAL LOW (ref 6.5–8.1)

## 2022-02-14 LAB — ACETAMINOPHEN LEVEL: Acetaminophen (Tylenol), Serum: <10 ug/mL — ABNORMAL LOW (ref 10–30)

## 2022-02-14 LAB — CBG MONITORING, ED: Glucose-Capillary: 111 mg/dL — ABNORMAL HIGH (ref 70–99)

## 2022-02-14 LAB — GLUCOSE, CAPILLARY: Glucose-Capillary: 121 mg/dL — ABNORMAL HIGH (ref 70–99)

## 2022-02-14 LAB — MAGNESIUM
Magnesium: 1.9 mg/dL (ref 1.7–2.4)
Magnesium: 1.9 mg/dL (ref 1.7–2.4)

## 2022-02-14 LAB — LACTIC ACID, PLASMA: Lactic Acid, Venous: 1.1 mmol/L (ref 0.5–1.9)

## 2022-02-14 LAB — SALICYLATE LEVEL: Salicylate Lvl: <7 mg/dL — ABNORMAL LOW (ref 7.0–30.0)

## 2022-02-14 LAB — TROPONIN I (HIGH SENSITIVITY): Troponin I (High Sensitivity): 11 ng/L (ref <18)

## 2022-02-14 LAB — MRSA NEXT GEN BY PCR, NASAL: MRSA by PCR Next Gen: DETECTED — AB

## 2022-02-14 LAB — HIV ANTIBODY (ROUTINE TESTING W REFLEX): HIV Screen 4th Generation wRfx: NONREACTIVE

## 2022-02-14 LAB — AMMONIA: Ammonia: 97 umol/L — ABNORMAL HIGH (ref 9–35)

## 2022-02-14 MED ORDER — ETOMIDATE 2 MG/ML IV SOLN
10.0000 mg | Freq: Once | INTRAVENOUS | Status: AC
  Administered 2022-02-14: 10 mg via INTRAVENOUS

## 2022-02-14 MED ORDER — DOCUSATE SODIUM 50 MG/5ML PO LIQD
100.0000 mg | Freq: Two times a day (BID) | ORAL | Status: DC
  Administered 2022-02-15 – 2022-02-20 (×6): 100 mg
  Filled 2022-02-14 (×10): qty 10

## 2022-02-14 MED ORDER — FENTANYL CITRATE PF 50 MCG/ML IJ SOSY
50.0000 ug | PREFILLED_SYRINGE | INTRAMUSCULAR | Status: DC | PRN
  Administered 2022-02-14: 50 ug via INTRAVENOUS
  Administered 2022-02-14 – 2022-02-17 (×2): 100 ug via INTRAVENOUS
  Administered 2022-02-17: 50 ug via INTRAVENOUS
  Administered 2022-02-17: 100 ug via INTRAVENOUS
  Administered 2022-02-18: 200 ug via INTRAVENOUS
  Administered 2022-02-18: 100 ug via INTRAVENOUS
  Administered 2022-02-18: 50 ug via INTRAVENOUS
  Administered 2022-02-18: 100 ug via INTRAVENOUS
  Administered 2022-02-19: 50 ug via INTRAVENOUS
  Administered 2022-02-20 (×2): 100 ug via INTRAVENOUS
  Administered 2022-02-21 (×2): 50 ug via INTRAVENOUS
  Administered 2022-02-22: 100 ug via INTRAVENOUS
  Administered 2022-02-22: 50 ug via INTRAVENOUS
  Filled 2022-02-14 (×3): qty 1
  Filled 2022-02-14 (×3): qty 2
  Filled 2022-02-14: qty 1
  Filled 2022-02-14: qty 4
  Filled 2022-02-14 (×2): qty 1
  Filled 2022-02-14 (×3): qty 2
  Filled 2022-02-14: qty 1
  Filled 2022-02-14: qty 2
  Filled 2022-02-14 (×2): qty 1
  Filled 2022-02-14: qty 4

## 2022-02-14 MED ORDER — VECURONIUM BROMIDE 10 MG IV SOLR
10.0000 mg | Freq: Four times a day (QID) | INTRAVENOUS | Status: AC | PRN
  Administered 2022-02-14 – 2022-02-15 (×3): 10 mg via INTRAVENOUS
  Filled 2022-02-14 (×3): qty 10

## 2022-02-14 MED ORDER — IPRATROPIUM-ALBUTEROL 0.5-2.5 (3) MG/3ML IN SOLN
RESPIRATORY_TRACT | Status: AC
  Filled 2022-02-14: qty 3

## 2022-02-14 MED ORDER — POTASSIUM CHLORIDE 20 MEQ PO PACK
40.0000 meq | PACK | Freq: Four times a day (QID) | ORAL | Status: DC
  Administered 2022-02-14: 40 meq via ORAL
  Filled 2022-02-14: qty 2

## 2022-02-14 MED ORDER — LACTULOSE 10 GM/15ML PO SOLN
20.0000 g | Freq: Two times a day (BID) | ORAL | Status: DC
  Administered 2022-02-15 – 2022-02-20 (×11): 20 g
  Filled 2022-02-14 (×13): qty 30

## 2022-02-14 MED ORDER — CALCIUM GLUCONATE-NACL 2-0.675 GM/100ML-% IV SOLN: 2.0000 g | Freq: Once | INTRAVENOUS | Status: DC

## 2022-02-14 MED ORDER — NOREPINEPHRINE 4 MG/250ML-% IV SOLN
2.0000 ug/min | INTRAVENOUS | Status: DC
  Administered 2022-02-14: 3 ug/min via INTRAVENOUS
  Administered 2022-02-14: 2 ug/min via INTRAVENOUS
  Administered 2022-02-15: 10 ug/min via INTRAVENOUS
  Filled 2022-02-14 (×3): qty 250

## 2022-02-14 MED ORDER — FENTANYL CITRATE PF 50 MCG/ML IJ SOSY: 50.0000 ug | PREFILLED_SYRINGE | Freq: Once | INTRAMUSCULAR | Status: DC

## 2022-02-14 MED ORDER — PROPOFOL 1000 MG/100ML IV EMUL
0.0000 ug/kg/min | INTRAVENOUS | Status: DC
  Administered 2022-02-14: 25 ug/kg/min via INTRAVENOUS
  Administered 2022-02-14 (×2): 5 ug/kg/min via INTRAVENOUS
  Administered 2022-02-15: 15 ug/kg/min via INTRAVENOUS
  Administered 2022-02-15: 30 ug/kg/min via INTRAVENOUS
  Administered 2022-02-15: 15 ug/kg/min via INTRAVENOUS
  Administered 2022-02-15 (×2): 25 ug/kg/min via INTRAVENOUS
  Administered 2022-02-16: 30 ug/kg/min via INTRAVENOUS
  Administered 2022-02-16: 50 ug/kg/min via INTRAVENOUS
  Administered 2022-02-16: 30 ug/kg/min via INTRAVENOUS
  Administered 2022-02-16: 20 ug/kg/min via INTRAVENOUS
  Administered 2022-02-16: 40 ug/kg/min via INTRAVENOUS
  Administered 2022-02-16 (×2): 30 ug/kg/min via INTRAVENOUS
  Administered 2022-02-17: 40 ug/kg/min via INTRAVENOUS
  Administered 2022-02-17: 30 ug/kg/min via INTRAVENOUS
  Administered 2022-02-17: 14 ug/kg/min via INTRAVENOUS
  Administered 2022-02-17: 5 ug/kg/min via INTRAVENOUS
  Administered 2022-02-17: 40 ug/kg/min via INTRAVENOUS
  Administered 2022-02-18: 5 ug/kg/min via INTRAVENOUS
  Administered 2022-02-19: 30 ug/kg/min via INTRAVENOUS
  Administered 2022-02-19: 40 ug/kg/min via INTRAVENOUS
  Administered 2022-02-19: 30 ug/kg/min via INTRAVENOUS
  Administered 2022-02-19: 10 ug/kg/min via INTRAVENOUS
  Administered 2022-02-19: 30 ug/kg/min via INTRAVENOUS
  Administered 2022-02-19: 40 ug/kg/min via INTRAVENOUS
  Administered 2022-02-20 – 2022-02-21 (×7): 20 ug/kg/min via INTRAVENOUS
  Administered 2022-02-21: 10 ug/kg/min via INTRAVENOUS
  Filled 2022-02-14 (×30): qty 100
  Filled 2022-02-14: qty 200
  Filled 2022-02-14 (×6): qty 100

## 2022-02-14 MED ORDER — ETOMIDATE 2 MG/ML IV SOLN
INTRAVENOUS | Status: AC
  Filled 2022-02-14: qty 10

## 2022-02-14 MED ORDER — POTASSIUM CHLORIDE 10 MEQ/100ML IV SOLN
10.0000 meq | INTRAVENOUS | Status: AC
  Administered 2022-02-14 – 2022-02-15 (×6): 10 meq via INTRAVENOUS
  Filled 2022-02-14 (×5): qty 100

## 2022-02-14 MED ORDER — HYDROCORTISONE SOD SUC (PF) 100 MG IJ SOLR
100.0000 mg | Freq: Three times a day (TID) | INTRAMUSCULAR | Status: AC
  Administered 2022-02-14 (×3): 100 mg via INTRAVENOUS
  Filled 2022-02-14 (×3): qty 2

## 2022-02-14 MED ORDER — CALCIUM GLUCONATE-NACL 1-0.675 GM/50ML-% IV SOLN
1.0000 g | INTRAVENOUS | Status: AC
  Administered 2022-02-14 (×2): 1000 mg via INTRAVENOUS
  Filled 2022-02-14 (×2): qty 50

## 2022-02-14 MED ORDER — SODIUM CHLORIDE 0.9 % IV SOLN
250.0000 mL | INTRAVENOUS | Status: DC
  Administered 2022-02-14 (×2): 250 mL via INTRAVENOUS

## 2022-02-14 MED ORDER — POTASSIUM CHLORIDE 20 MEQ PO PACK
40.0000 meq | PACK | Freq: Four times a day (QID) | ORAL | Status: AC
  Administered 2022-02-15: 40 meq
  Filled 2022-02-14: qty 2

## 2022-02-14 MED ORDER — ALBUTEROL SULFATE (2.5 MG/3ML) 0.083% IN NEBU: 2.5000 mg | INHALATION_SOLUTION | Freq: Four times a day (QID) | RESPIRATORY_TRACT | Status: DC | PRN

## 2022-02-14 MED ORDER — POLYETHYLENE GLYCOL 3350 17 G PO PACK
17.0000 g | PACK | Freq: Every day | ORAL | Status: DC
  Administered 2022-02-20: 17 g
  Filled 2022-02-14 (×4): qty 1

## 2022-02-14 MED ORDER — ENOXAPARIN SODIUM 80 MG/0.8ML IJ SOSY
80.0000 mg | PREFILLED_SYRINGE | INTRAMUSCULAR | Status: DC
  Administered 2022-02-14 – 2022-03-03 (×18): 80 mg via SUBCUTANEOUS
  Filled 2022-02-14 (×16): qty 0.8

## 2022-02-14 MED ORDER — SODIUM BICARBONATE 8.4 % IV SOLN
100.0000 meq | Freq: Once | INTRAVENOUS | Status: AC
  Administered 2022-02-14: 100 meq via INTRAVENOUS
  Filled 2022-02-14: qty 100

## 2022-02-14 MED ORDER — FUROSEMIDE 10 MG/ML IJ SOLN
40.0000 mg | Freq: Four times a day (QID) | INTRAMUSCULAR | Status: AC
  Administered 2022-02-15 (×3): 40 mg via INTRAVENOUS
  Filled 2022-02-14 (×3): qty 4

## 2022-02-14 MED ORDER — SUCCINYLCHOLINE CHLORIDE 200 MG/10ML IV SOSY
PREFILLED_SYRINGE | INTRAVENOUS | Status: AC
  Filled 2022-02-14: qty 10

## 2022-02-14 MED ORDER — CALCIUM GLUCONATE-NACL 2-0.675 GM/100ML-% IV SOLN
2.0000 g | INTRAVENOUS | Status: AC
  Administered 2022-02-14 (×2): 2000 mg via INTRAVENOUS
  Filled 2022-02-14 (×2): qty 100

## 2022-02-14 MED ORDER — STERILE WATER FOR INJECTION IV SOLN
INTRAVENOUS | Status: DC
  Filled 2022-02-14 (×6): qty 1000
  Filled 2022-02-14: qty 150
  Filled 2022-02-14: qty 1000

## 2022-02-14 MED ORDER — ONDANSETRON HCL 4 MG/2ML IJ SOLN
4.0000 mg | Freq: Four times a day (QID) | INTRAMUSCULAR | Status: DC | PRN
  Administered 2022-02-25 – 2022-03-01 (×4): 4 mg via INTRAVENOUS
  Filled 2022-02-14 (×4): qty 2

## 2022-02-14 MED ORDER — IPRATROPIUM-ALBUTEROL 0.5-2.5 (3) MG/3ML IN SOLN
3.0000 mL | Freq: Three times a day (TID) | RESPIRATORY_TRACT | Status: DC
  Administered 2022-02-14 – 2022-02-26 (×36): 3 mL via RESPIRATORY_TRACT
  Filled 2022-02-14 (×35): qty 3

## 2022-02-14 MED ORDER — ONDANSETRON HCL 4 MG PO TABS: 4.0000 mg | ORAL_TABLET | Freq: Four times a day (QID) | ORAL | Status: DC | PRN

## 2022-02-14 MED ORDER — SUCCINYLCHOLINE CHLORIDE 200 MG/10ML IV SOSY
150.0000 mg | PREFILLED_SYRINGE | Freq: Once | INTRAVENOUS | Status: AC
  Administered 2022-02-14: 150 mg via INTRAVENOUS

## 2022-02-14 MED ORDER — FENTANYL CITRATE PF 50 MCG/ML IJ SOSY
50.0000 ug | PREFILLED_SYRINGE | INTRAMUSCULAR | Status: DC | PRN
  Administered 2022-02-14 (×2): 50 ug via INTRAVENOUS
  Filled 2022-02-14: qty 1

## 2022-02-14 MED ORDER — FENTANYL 2500MCG IN NS 250ML (10MCG/ML) PREMIX INFUSION
50.0000 ug/h | INTRAVENOUS | Status: DC
  Administered 2022-02-14: 50 ug/h via INTRAVENOUS
  Administered 2022-02-15: 150 ug/h via INTRAVENOUS
  Administered 2022-02-15 – 2022-02-16 (×3): 200 ug/h via INTRAVENOUS
  Administered 2022-02-17: 100 ug/h via INTRAVENOUS
  Filled 2022-02-14 (×6): qty 250

## 2022-02-14 MED ORDER — PROCHLORPERAZINE EDISYLATE 10 MG/2ML IJ SOLN
10.0000 mg | Freq: Four times a day (QID) | INTRAMUSCULAR | Status: DC | PRN
  Administered 2022-02-26 – 2022-02-28 (×3): 10 mg via INTRAVENOUS
  Filled 2022-02-14 (×3): qty 2

## 2022-02-14 MED ORDER — MAGNESIUM SULFATE IN D5W 1-5 GM/100ML-% IV SOLN
1.0000 g | Freq: Once | INTRAVENOUS | Status: AC
  Administered 2022-02-15: 1 g via INTRAVENOUS
  Filled 2022-02-14 (×2): qty 100

## 2022-02-14 MED ORDER — CHLORHEXIDINE GLUCONATE CLOTH 2 % EX PADS
6.0000 | MEDICATED_PAD | Freq: Every day | CUTANEOUS | Status: DC
  Administered 2022-02-14 – 2022-03-01 (×16): 6 via TOPICAL

## 2022-02-14 MED ORDER — SODIUM CHLORIDE 0.9 % IV BOLUS
1000.0000 mL | Freq: Once | INTRAVENOUS | Status: AC
  Administered 2022-02-14: 1000 mL via INTRAVENOUS

## 2022-02-14 MED ORDER — ENOXAPARIN SODIUM 40 MG/0.4ML IJ SOSY: 40.0000 mg | PREFILLED_SYRINGE | INTRAMUSCULAR | Status: DC

## 2022-02-14 MED ORDER — FENTANYL BOLUS VIA INFUSION
50.0000 ug | INTRAVENOUS | Status: DC | PRN
  Administered 2022-02-15: 50 ug via INTRAVENOUS
  Administered 2022-02-15: 100 ug via INTRAVENOUS
  Administered 2022-02-15 (×2): 50 ug via INTRAVENOUS
  Administered 2022-02-15: 75 ug via INTRAVENOUS
  Administered 2022-02-15 – 2022-02-16 (×2): 50 ug via INTRAVENOUS
  Administered 2022-02-16 – 2022-02-17 (×2): 100 ug via INTRAVENOUS
  Administered 2022-02-17: 50 ug via INTRAVENOUS

## 2022-02-14 MED ORDER — MIDAZOLAM HCL 2 MG/2ML IJ SOLN
1.0000 mg | INTRAMUSCULAR | Status: DC | PRN
  Administered 2022-02-14 – 2022-02-15 (×2): 2 mg via INTRAVENOUS
  Filled 2022-02-14 (×2): qty 2

## 2022-02-14 MED ORDER — HYALURONIDASE HUMAN 150 UNIT/ML IJ SOLN
150.0000 [IU] | Freq: Once | INTRAMUSCULAR | Status: AC
  Administered 2022-02-14: 150 [IU] via SUBCUTANEOUS
  Filled 2022-02-14: qty 1

## 2022-02-14 MED ORDER — POTASSIUM CHLORIDE 10 MEQ/100ML IV SOLN
10.0000 meq | INTRAVENOUS | Status: AC
  Administered 2022-02-15 (×5): 10 meq via INTRAVENOUS
  Filled 2022-02-14 (×6): qty 100

## 2022-02-14 NOTE — Progress Notes (Signed)
eLink Physician-Brief Progress Note Patient Name: Alice [name] DOB: 04/30/1985 MRN: [id number]   Date of Service  02/14/2022  HPI/Events of Note  Notified of ABG. Patient was proned at 730 pm. Excellent Po2 and o2 sat is 100 while on 80%/peep 8/ RR 30/ VT 460. Patient purely with metabolic acidosis which is persistent. She is on 3 mic/min levophed, a bicarb drip at 125 cc/hour, and on sedation. Last K was critically low at 2.2 - this is being replaced with 60 meq IV Kcl - has only started with the second bag at this time  eICU Interventions  Lower fo2 to 70% and monitor Do not see repeat bmp ordered - will do one with mag at 11 pm - asked RN to notify us Critically low K while on bicarb drip - need to monitor carefully May end up needing RRT if unable to correct these issues  Asked RN to call us with the results      Intervention Category Major Interventions: Respiratory failure - evaluation and management  Margaretmary Lombard 02/14/2022, 9:15 PM

## 2022-02-14 NOTE — ED Notes (Signed)
0800: MD/RT in room to intubate at this time.  0801: Etomidate 10mg  administered. 6962: Succ 150mg  administered. 9528: Intubated at this time. EET size 7.5, 20 at teeth. +CO2 color change. +breath sounds.  4132: Assisted ventilations at this time with no difficulty. ETT secured.

## 2022-02-14 NOTE — ED Provider Notes (Signed)
0700  Patient has been holding for progressive bed at [facility] long overnight.  She has been on BiPAP.  Initially presented septic with lactic acidosis.  Repeat ABG this morning with pH is 7.01.  Patient has been on BiPAP and bicarb drip all night.  On my assessment, she is arousable but quite somnolent.  Lactate has cleared.  She is still significantly tachypneic on BiPAP.  Her metabolic acidosis has worsened on the bicarb drip overnight.  Seems out of proportion to her initial presentation of sepsis and lactic acidosis.  I have added additional lab work including Tylenol, salicylate level, serum awesome's.  Hospitalist was made aware of the patient's worsening acidosis.  He is assessing the patient at bedside.  Do not feel that the patient benefit from intubation as she appears to be trying to compensate for a primary metabolic event.  7:33 AM  Spoke again with ICU.  Recommends transfer for ICU evaluation.  Given severity and worsening of acidosis, would intubate for transport.    Physical Exam  BP 98/81   Pulse (!) 113   Temp 99.3 F (37.4 C)   Resp (!) 42   Ht 1.753 m (5\' 9" )   Wt (!) 167.4 kg   LMP  (LMP Unknown)   SpO2 100%   BMI 54.50 kg/m   Physical Exam  Procedures  .Critical Care  Performed by: Merryl Hacker, MD Authorized by: Merryl Hacker, MD   Critical care provider statement:    Critical care time (minutes):  45   Critical care was necessary to treat or prevent imminent or life-threatening deterioration of the following conditions:  Metabolic crisis   Critical care was time spent personally by me on the following activities:  Development of treatment plan with patient or surrogate, discussions with consultants, evaluation of patient's response to treatment, examination of patient, ordering and review of laboratory studies, ordering and review of radiographic studies, ordering and performing treatments and interventions, pulse oximetry, re-evaluation of patient's  condition and review of old charts   I assumed direction of critical care for this patient from another provider in my specialty: yes     Care discussed with: admitting provider   Procedure Name: Intubation Date/Time: 02/14/2022 8:07 AM  Performed by: Merryl Hacker, MDPre-anesthesia Checklist: Patient identified, Patient being monitored, Emergency Drugs available, Timeout performed and Suction available Oxygen Delivery Method: Non-rebreather mask Preoxygenation: Pre-oxygenation with 100% oxygen Induction Type: Rapid sequence Ventilation: Mask ventilation without difficulty Laryngoscope Size: Glidescope Grade View: Grade III Tube size: 8.0 mm Number of attempts: 1 Placement Confirmation: ETT inserted through vocal cords under direct vision, CO2 detector and Breath sounds checked- equal and bilateral Secured at: 20 cm Tube secured with: ETT holder Dental Injury: Teeth and Oropharynx as per pre-operative assessment  Comments: Patient airway anterior.  Had significant dried blood in the airway.      ED Course / MDM   Clinical Course as of 02/14/22 0722  Sun Feb 13, 2022  1659 WBC(!): 30.0 [HN]  1659 DG Chest 1 View New patchy infiltrates are seen in right mid and both lower lung fields suggesting multifocal pneumonia.   [HN]  1703 Signed out to Dr Mayra Neer [RP]  1706 Rectal temp 64F, on bair hugger [HN]  1729 Creatinine(!): 2.25 AKI [HN]  1729 Potassium(!): 2.8 Will replete [HN]  3382 Wounds of abdomen/inguinal region are severe and open with ulceration. Per chart review, patient had been seen and was admitted at [facility] in October for similar presentation  w/ sepsis d/t gram negative pneumonia and had abdominal cellulitis at that time thought to be due to severe candida intertrigo. Per care everywhere documentation: "Bilateral gram-negative pneumonia with sepsis:  Panniculitis with severe Candida intertrigo She was placed on vancomycin and cefepime to cover both pneumonia and  the panniculitis. She received fluconazole for intertrigo and seen by the wound care nurse for local wound dressing . Since being on antibiotics, she feels a lot better. Panniculitis is improved. " [HN]  1826 With tachycardia, tachypnea, WBC 30, hypothermia, and suspected source(s) of infection (pneumonia vs abd wall cellulitis) will consider septic. Blood cultures drawn, giving 2nd liter of fluid. [HN]  1827 Influenza B By PCR(!): POSITIVE [HN]  1905 Had ordered zosyn, pharmacy recommending switching to cefepime flagyl given renal function [HN]  1905 CT CHEST ABDOMEN PELVIS WO CONTRAST 1. Lower lobe predominant patchy ground-glass airspace opacities that are peripheral and peribronchovascular. Findings consistent inflammation (aspiration pneumonia)/infection (COVID-19 not excluded). Recommend CT chest in 3 months to evaluate for resolution. 2. Debris within the trachea. 3. Nonobstructive 3 mm left nephrolithiasis. 4. Right nephrectomy. 5. T-shaped intrauterine device in appropriate position within the uterus.   [HN]  1906 CT Head Wo Contrast No acute intracranial abnormality. [HN]  1938 CO2(!): <7 Will give bicarb 50 mEq. AG not calculable. Likely a metabolic acidosis, no h/o diabetes, no report of EtOH.  Admitted to hospitalist. [HN]  1938 Hospitalist requesting tamiflu [HN]  2047 Patient tachypneic. Starting on bicarb gtt. D/t hospitalist and will consult to critical care for transfer to ICU. [HN]  2135 Discussed extensively with the critical care physician at [facility].  He stated that there are no beds available at [facility] or [facility] and patient would likely sit and await a bed.  His recommendation would be to admit with the hospitalist and continue to monitor her status.  Will discuss with the hospitalist. [HN]  2135 pH, Arterial(!!): 7.1 [HN]  2135 pCO2 arterial(!!): <18 [HN]  2222 On maintenance fluids and bicarb gtt. Hospitalist will assist in management overnight. [HN]  2223  Lactic Acid, Venous: 1.4 Lactate downtrending [HN]  2256 Satting 85% on [place]. Starting BiPAP.  [HN]  2326 Would like to avoid intubation for this patient if possible d/t severe acidosis. Patient is likely compensating with respiratory drive for her severe metabolic acidosis and I believe acidosis may worsen if she is intubated/ventilated. Will trial patient on BiPAP [HN]    Clinical Course User Index [HN] Audley Hose, MD [RP] Fransico Meadow, MD   Medical Decision Making Amount and/or Complexity of Data Reviewed Labs: ordered. Decision-making details documented in ED Course. Radiology: ordered. Decision-making details documented in ED Course. ECG/medicine tests: ordered.  Risk Prescription drug management. Decision regarding hospitalization.   Problem List Items Addressed This Visit       Genitourinary   AKI (acute kidney injury) ([place])   Other Visit Diagnoses     Nausea vomiting and diarrhea    -  Primary   Flu-like symptoms       Generalized abdominal pain       Open wound of anterior abdominal wall with complication, subsequent encounter                 Merryl Hacker, MD 02/14/22 [phone]

## 2022-02-14 NOTE — Progress Notes (Addendum)
PROGRESS NOTE  TIEARA AREVALOS [id number] DOB: [birth year] DOA: 02/13/2022 PCP: Pcp, No  Brief History:  37 y.o. 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  At bedside, patient was awake and only alert to self.  History was obtained from ED physician and ED medical record.  Per report, patient was just activated EMS and arrival of EMS then, she was found on the ground at the apartment.  She reported having nausea, vomiting and mild abdominal pain.  There was limited information about patient at this time since patient was unable to provide more history.  Sisters number on ED medical record and husband's number on the chart were called without being able to reach them. Patient was recently admitted at [facility] from 12/10 to 12/16 due to non-anion gap metabolic acidosis due to ulcerative lesions on # inner thighs suspected to be fungal intertrigo.  She was seen by dermatology who did not feel patient had pyoderma gangrenosum.  They felt if was more in line with a fungal infx.  She was treated with fluconazole and discharged with 7 more days. Aquacel AG was added for wound care. she was admitted to [facility] on 10/28 to 12/30/2021 with cellulitis, candidal intertrigo and pneumonia w/ sepsis and treated with IV antibiotics, then discharged home with 10 day course of doxycyline.   ED Course:  In the emergency department, temperature was 98.51F, but this decreased to 94.26F after arrival to the ED, respiratory rate 25/min, pulse 101 bpm, BP 126/82, O2 sat was 99% on room air.  Workup in the ED showed normal CBC except for with a left shift, BMP showed sodium 133, potassium 2.8, chloride 110, bicarb less than 7, blood glucose 90, BUN 20, creatinine 2.25, most recent creatinine for  comparison was 3 years ago which range from 0.6 - 0.9). Pregnancy test was negative, urine drug screen was positive for opiates, lactic acid was 3.2, troponin was 6 and alcohol level was less than 10, urinalysis was unimpressive for UTI.  Influenza A, SARS coronavirus 2, RSV was negative, influenza B was positive. CT head without contrast showed no acute intracranial abnormality CT chest, abdomen and pelvis without contrast showed lower lobe predominant patchy groundglass airspace opacities at the peripheral peribronchial.  Findings consistent with inflammation (aspiration pneumonia)/infection (COVID 19 not excluded).  Debris within the trachea.  Nonobstructing 3 mm left nephrolithiasis.  Right nephrectomy.  Patient remained severely acidotic despite 2 amps bicarb and bicarb drip.  ABG showed 7.01/23/209/5.  Patient was co-managed with assistance of Dr. Dina Rich and Rodell Perna.  PCCM was re-consulted and pt will transfer to [facility] ICU.  Assessment/Plan: Severe Sepsis -due to panniculitis and pneumonia -Lactate 3.2>>1.4 -check PCT -continue IVF -continue vanc, cefepime, metronidazole -follow blood culture -continue empiric solucortef  Metabolic acidosis -give additional 2 amps bicarb -BHA neg -increase bicarb drip rate to 125 cc/hr--change drip to 3 amps in sterile water -Lactic acid [id number] -check salicylate, ammonia  Acute respiratory failure with hypoxia -remain on BiPAP for now -due to influenza in setting of OSA/OHS -CT chest with bilateral LL GGO  Influenza B -continue oseltamivir if possible  Panniculitis -no signs of necrotizing fasciitis on CT abd -wound care consult -fluid from central wound mostly serous  Acute metabolic encephalopathy -due to respiratory failure, metabolic derangements, infection -  CT brain neg -UA--no significant pyuria -obtain EEG--pt has gaze preference -personally reviewed EKG-sinus tach, no concerning STT changes -UDS positive opiates  AKI -due  to hemodynamic changes/ATN, volume depletion -continue IVF -no hydronephrosis on CT -baseline creatinine 0.6-0.9  Hyponatremia -due to volume depletion          Family Communication:  no Family at bedside  Consultants:  PCCM  Code Status:  FULL   DVT Prophylaxis:  [place] Lovenox   Procedures: As Listed in Progress Note Above  Antibiotics: Vanc 12/31>> Cefepime 12/31>> Flagyl 12/31>>      Subjective: Pt somnolent.  Withdraws to protopathic stimuli.  ROS not possible.  Objective: Vitals:   02/14/22 0515 02/14/22 0530 02/14/22 0545 02/14/22 0608  BP: 92/63 94/63 95/67  98/81  Pulse: (!) 40 (!) 113 (!) 113 (!) 113  Resp: (!) 41 (!) 39 (!) 40 (!) 42  Temp: 99.3 F (37.4 C) 99.3 F (37.4 C) 99.5 F (37.5 C) 99.3 F (37.4 C)  TempSrc:      SpO2: 100% 100% 100% 100%  Weight:      Height:        Intake/Output Summary (Last 24 hours) at 02/14/2022 0742 Last data filed at 02/14/2022 8882 Gross per 24 hour  Intake 3809.76 ml  Output --  Net 3809.76 ml   Weight change:  Exam:  General:  Pt is somnolent.  On BiPAP.  Does not follow commands HEENT: No icterus, No thrush, No neck mass, [place]/AT Cardiovascular: RRR, S1/S2, no rubs, no gallops Respiratory: bibasilar rales. No wheeze Abdomen: Soft/+BS, non tender, non distended, no guarding Extremities: No edema, No lymphangitis, No petechiae, No rashes, no synovitis   Data Reviewed: I have personally reviewed following labs and imaging studies Basic Metabolic Panel: Recent Labs  Lab 02/13/22 1620 02/13/22 2131  NA 133* 132*  K 2.8* 3.7  CL 110 110  CO2 <7* <7*  GLUCOSE 90 65*  BUN 20 22*  CREATININE 2.25* 2.31*  CALCIUM 7.1* 6.4*   Liver Function Tests: No results for input(s): "AST", "ALT", "ALKPHOS", "BILITOT", "PROT", "ALBUMIN" in the last 168 hours. No results for input(s): "LIPASE", "AMYLASE" in the last 168 hours. No results for input(s): "AMMONIA" in the last 168 hours. Coagulation Profile: No  results for input(s): "INR", "PROTIME" in the last 168 hours. CBC: Recent Labs  Lab 02/13/22 1620  WBC 30.0*  NEUTROABS 26.1*  HGB 13.2  HCT 41.2  MCV 95.6  PLT 258   Cardiac Enzymes: No results for input(s): "CKTOTAL", "CKMB", "CKMBINDEX", "TROPONINI" in the last 168 hours. BNP: Invalid input(s): "POCBNP" CBG: No results for input(s): "GLUCAP" in the last 168 hours. HbA1C: No results for input(s): "HGBA1C" in the last 72 hours. Urine analysis:    Component Value Date/Time   COLORURINE YELLOW 02/13/2022 1942   APPEARANCEUR CLEAR 02/13/2022 1942   APPEARANCEUR Cloudy (A) 04/07/2017 1104   LABSPEC 1.014 02/13/2022 1942   PHURINE 5.0 02/13/2022 1942   GLUCOSEU 50 (A) 02/13/2022 1942   HGBUR MODERATE (A) 02/13/2022 1942   BILIRUBINUR NEGATIVE 02/13/2022 1942   BILIRUBINUR Negative 04/07/2017 1104   KETONESUR NEGATIVE 02/13/2022 1942   PROTEINUR >=300 (A) 02/13/2022 1942   UROBILINOGEN 0.2 02/01/2014 1036   NITRITE NEGATIVE 02/13/2022 1942   LEUKOCYTESUR SMALL (A) 02/13/2022 1942   Sepsis Labs: @LABRCNTIP (procalcitonin:4,lacticidven:4) ) Recent Results (from the past 240 hour(s))  Resp panel by RT-PCR (RSV, Flu A&B, Covid) Anterior Nasal Swab     Status: Abnormal   Collection Time: 02/13/22  4:55 PM  Specimen: Anterior Nasal Swab  Result Value Ref Range Status   SARS Coronavirus 2 by RT PCR NEGATIVE NEGATIVE Final    Comment: (NOTE) SARS-CoV-2 target nucleic acids are NOT DETECTED.  The SARS-CoV-2 RNA is generally detectable in upper respiratory specimens during the acute phase of infection. The lowest concentration of SARS-CoV-2 viral copies this assay can detect is 138 copies/mL. A negative result does not preclude SARS-Cov-2 infection and should not be used as the sole basis for treatment or other patient management decisions. A negative result may occur with  improper specimen collection/handling, submission of specimen other than nasopharyngeal swab, presence  of viral mutation(s) within the areas targeted by this assay, and inadequate number of viral copies(<138 copies/mL). A negative result must be combined with clinical observations, patient history, and epidemiological information. The expected result is Negative.  Fact Sheet for Patients:  BloggerCourse.com  Fact Sheet for Healthcare Providers:  SeriousBroker.it  This test is no t yet approved or cleared by the [place] FDA and  has been authorized for detection and/or diagnosis of SARS-CoV-2 by FDA under an Emergency Use Authorization (EUA). This EUA will remain  in effect (meaning this test can be used) for the duration of the COVID-19 declaration under Section 564(b)(1) of the Act, 21 U.S.C.section 360bbb-3(b)(1), unless the authorization is terminated  or revoked sooner.       Influenza A by PCR NEGATIVE NEGATIVE Final   Influenza B by PCR POSITIVE (A) NEGATIVE Final    Comment: (NOTE) The Xpert Xpress SARS-CoV-2/FLU/RSV plus assay is intended as an aid in the diagnosis of influenza from Nasopharyngeal swab specimens and should not be used as a sole basis for treatment. Nasal washings and aspirates are unacceptable for Xpert Xpress SARS-CoV-2/FLU/RSV testing.  Fact Sheet for Patients: BloggerCourse.com  Fact Sheet for Healthcare Providers: SeriousBroker.it  This test is not yet approved or cleared by the [place] FDA and has been authorized for detection and/or diagnosis of SARS-CoV-2 by FDA under an Emergency Use Authorization (EUA). This EUA will remain in effect (meaning this test can be used) for the duration of the COVID-19 declaration under Section 564(b)(1) of the Act, 21 U.S.C. section 360bbb-3(b)(1), unless the authorization is terminated or revoked.     Resp Syncytial Virus by PCR NEGATIVE NEGATIVE Final    Comment: (NOTE) Fact Sheet for  Patients: BloggerCourse.com  Fact Sheet for Healthcare Providers: SeriousBroker.it  This test is not yet approved or cleared by the [place] FDA and has been authorized for detection and/or diagnosis of SARS-CoV-2 by FDA under an Emergency Use Authorization (EUA). This EUA will remain in effect (meaning this test can be used) for the duration of the COVID-19 declaration under Section 564(b)(1) of the Act, 21 U.S.C. section 360bbb-3(b)(1), unless the authorization is terminated or revoked.  Performed at [facility], [address] [place], [place] [address]   Blood culture (routine x 2)     Status: None (Preliminary result)   Collection Time: 02/13/22  6:28 PM   Specimen: BLOOD  Result Value Ref Range Status   Specimen Description BLOOD  Final   Special Requests   Final    AEROBIC BOTTLE ONLY Blood Culture adequate volume BLOOD RIGHT HAND Performed at [facility], [address], [place], [place] [address]    Culture PENDING  Incomplete   Report Status PENDING  Incomplete  Blood culture (routine x 2)     Status: None (Preliminary result)   Collection Time: 02/13/22  7:24 PM  Specimen: BLOOD  Result Value Ref Range Status   Specimen Description BLOOD BLOOD RIGHT HAND  Final   Special Requests   Final    AEROBIC BOTTLE ONLY Blood Culture results may not be optimal due to an inadequate volume of blood received in culture bottles Performed at [facility], [address], [place], [place] [address]    Culture PENDING  Incomplete   Report Status PENDING  Incomplete     Scheduled Meds:  dextromethorphan-guaiFENesin  1 tablet Oral BID   enoxaparin (LOVENOX) injection  80 mg Subcutaneous Q24H   hydrocortisone sod succinate (SOLU-CORTEF) inj  100 mg Intravenous Q8H   ipratropium-albuterol  3 mL Nebulization TID   ipratropium-albuterol       oseltamivir  30 mg Oral BID   oseltamivir  75 mg Oral Once   potassium chloride  40  mEq Oral Once   vancomycin variable dose per unstable renal function (pharmacist dosing)   Does not apply See admin instructions   Continuous Infusions:  sodium chloride     ceFEPime (MAXIPIME) IV 2 g (02/14/22 0739)   metronidazole Stopped (02/13/22 2054)   norepinephrine (LEVOPHED) Adult infusion     sodium bicarbonate 75 mEq in sodium chloride 0.45 % 1,075 mL infusion 125 mL/hr at 02/14/22 0735    Procedures/Studies: CT Head Wo Contrast  Result Date: 02/13/2022 CLINICAL DATA:  Mental status change, unknown cause EXAM: CT HEAD WITHOUT CONTRAST TECHNIQUE: Contiguous axial images were obtained from the base of the skull through the vertex without intravenous contrast. RADIATION DOSE REDUCTION: This exam was performed according to the departmental dose-optimization program which includes automated exposure control, adjustment of the mA and/or kV according to patient size and/or use of iterative reconstruction technique. COMPARISON:  CT head 03/30/2017 FINDINGS: Brain: No evidence of large-territorial acute infarction. No parenchymal hemorrhage. No mass lesion. No extra-axial collection. No mass effect or midline shift. No hydrocephalus. Basilar cisterns are patent. Vascular: No hyperdense vessel. Skull: No acute fracture or focal lesion. Sinuses/Orbits: Paranasal sinuses and mastoid air cells are clear. The orbits are unremarkable. Other: None. IMPRESSION: No acute intracranial abnormality. Electronically Signed   By: Iven Finn M.D.   On: 02/13/2022 19:01   CT CHEST ABDOMEN PELVIS WO CONTRAST  Result Date: 02/13/2022 CLINICAL DATA:  Abdominal pain, acute, nonlocalized. Mental status change, general abd pain, N/V. Hx of rt nephrectomy. Pt unable to follow breathing instructions or raise arms. c/o flu sx; pt states she is weak and was found in floor by ems when they arrived; EXAM: CT CHEST, ABDOMEN AND PELVIS WITHOUT CONTRAST TECHNIQUE: Multidetector CT imaging of the chest, abdomen and pelvis  was performed following the standard protocol without IV contrast. RADIATION DOSE REDUCTION: This exam was performed according to the departmental dose-optimization program which includes automated exposure control, adjustment of the mA and/or kV according to patient size and/or use of iterative reconstruction technique. COMPARISON:  None Available. FINDINGS: CT CHEST FINDINGS Cardiovascular: Normal heart size. No significant pericardial effusion. The thoracic aorta is normal in caliber. No atherosclerotic plaque of the thoracic aorta. No coronary artery calcifications. Mediastinum/Nodes: No gross hilar adenopathy, noting limited sensitivity for the detection of hilar adenopathy on this noncontrast study. No enlarged mediastinal or axillary lymph nodes. Debris within the trachea. Thyroid and esophagus demonstrate no significant findings. Lungs/Pleura: Lower lobe predominant patchy ground-glass airspace opacities that are peripheral and peribronchovascular. No pulmonary nodule. No pulmonary mass. No pleural effusion. No pneumothorax. Musculoskeletal: No chest wall abnormality. No suspicious lytic or blastic osseous  lesions. No acute displaced fracture. Multilevel degenerative changes of the spine. Limited evaluation of the ribs and sternum due to motion artifact. CT ABDOMEN PELVIS FINDINGS Hepatobiliary: No focal liver abnormality. Status post cholecystectomy. No biliary dilatation. Pancreas: No focal lesion. Normal pancreatic contour. No surrounding inflammatory changes. No main pancreatic ductal dilatation. Spleen: Normal in size without focal abnormality. Adrenals/Urinary Tract: No adrenal nodule bilaterally. Status post right nephrectomy. No hydronephrosis. 3 mm left nephrolithiasis. No ureterolithiasis or hydroureter. Foley catheter tip and balloon terminate within urinary bladder lumen. The urinary bladder is decompressed. Stomach/Bowel: Stomach is within normal limits. No evidence of bowel wall thickening or  dilatation. Appendix appears normal. Vascular/Lymphatic: No abdominal aorta or iliac aneurysm. Mild atherosclerotic plaque of the aorta and its branches. No abdominal, pelvic, or inguinal lymphadenopathy. Reproductive: T-shaped intrauterine device in appropriate position within the uterus. Uterus and bilateral adnexa are unremarkable. Other: No intraperitoneal free fluid. No intraperitoneal free gas. No organized fluid collection. Musculoskeletal: No abdominal wall hernia or abnormality. No suspicious lytic or blastic osseous lesions. No acute displaced fracture. Multilevel degenerative changes of the spine. IMPRESSION: 1. Lower lobe predominant patchy ground-glass airspace opacities that are peripheral and peribronchovascular. Findings consistent inflammation (aspiration pneumonia)/infection (COVID-19 not excluded). Recommend CT chest in 3 months to evaluate for resolution. 2. Debris within the trachea. 3. Nonobstructive 3 mm left nephrolithiasis. 4. Right nephrectomy. 5. T-shaped intrauterine device in appropriate position within the uterus. Electronically Signed   By: Iven Finn M.D.   On: 02/13/2022 18:59   DG Chest 1 View  Result Date: 02/13/2022 CLINICAL DATA:  Shortness of breath, cough EXAM: CHEST  1 VIEW COMPARISON:  Previous studies including chest radiograph done on 11/03/2015 and CT done on 03/30/2017 FINDINGS: There is poor inspiration. New patchy infiltrates are seen in right parahilar region and both lower lung fields. Lateral CP angles are clear. There is no pneumothorax. Patient's chin is partly obscuring both apices. IMPRESSION: New patchy infiltrates are seen in right mid and both lower lung fields suggesting multifocal pneumonia. Electronically Signed   By: Elmer Picker M.D.   On: 02/13/2022 16:16    Orson Eva, DO  [facility]  If 7PM-7AM, please contact night-coverage [email or web address] Password [email or web address] 02/14/2022, 7:42 AM   LOS: 1 day

## 2022-02-14 NOTE — Procedures (Signed)
Arterial Catheter Insertion Procedure Note  Alice [name]  [id number]  [birth year]  Date:02/14/22  Time:4:06 PM    Provider Performing: Octavio Graves Dakiyah Heinke    Procedure: Insertion of Arterial Line [phone]) with US guidance ([address])   Indication(s) Blood pressure monitoring and/or need for frequent ABGs  Consent Risks of the procedure as well as the alternatives and risks of each were explained to the patient and/or caregiver.  Consent for the procedure was obtained and is signed in the bedside chart  Anesthesia None  Time Out Verified patient identification, verified procedure, site/side was marked, verified correct patient position, special equipment/implants available, medications/allergies/relevant history reviewed, required imaging and test results available.   Sterile Technique Maximal sterile technique including full sterile barrier drape, hand hygiene, sterile gown, sterile gloves, mask, hair covering, sterile ultrasound probe cover (if used).   Procedure Description Area of catheter insertion was cleaned with chlorhexidine and draped in sterile fashion. With real-time ultrasound guidance an arterial catheter was placed into the right radial artery.  Appropriate arterial tracings confirmed on monitor.     Complications/Tolerance None; patient tolerated the procedure well.   EBL Minimal   Specimen(s) None  [name], DO [facility] 02/14/2022 4:07 PM

## 2022-02-14 NOTE — Consult Note (Addendum)
WOC Nurse Consult Note: Remote consult completed with review of the electronic medical record and photograph in chart.  Will implement topical orders.  Reason for Consult: Sepsis in the setting of cellulitis.  Intertriginous dermatitis to abdominal pannus and perineal skin.  Recent hospital admission for infection to this area, has failed to improve Wound type:infectious Pressure Injury POA: NA Measurement: bedside RN to measure mid abdominal wound during skin assessment and document in flow sheet.  Abdominal pannus, inguinal and perineal skin folds all have full thickness tissue loss with weeping, erythema and induration.  Bedside RN has communicated that the wound has depth to it and there is visible adipose in the wound bed.  Will implement absorptive silver dressing to fill dead space and promote healing.  Wound bed: Red and friable Drainage (amount, consistency, odor) moderate  serosanguinous weeping.  Musty odor Periwound: Induration Dressing procedure/placement/frequency: Cleanse abdominal and perineal skin folds with soap and water and pat dry. Apply Inter Dry to skin folds: Measure and cut length of InterDry to fit in skin folds that have skin breakdown Tuck InterDry fabric into skin folds in a single layer, allow for 2 inches of overhang from skin edges to allow for wicking to occur May remove to bathe; dry area thoroughly and then tuck into affected areas again Do not apply any creams or ointments when using InterDry DO NOT THROW AWAY FOR 5 DAYS unless soiled with stool DO NOT [facility] product, this will inactivate the silver in the material  New sheet of Interdry should be applied after 5 days of use if patient continues to have skin breakdown    For abdominal wound:   Apply aquacel Ag ([facility] # [id number]) to open wound. Cut into thin strip and pack into depth as necessary. Cover with gauze and secure with ABD pad or mesh underwear to hold in place if this will fit body habitus comfortably.    Will not follow at this time.  Please re-consult if needed.  Alice Ludwig MSN, RN, FNP-BC CWON Wound, Ostomy, Continence Nurse [facility] [phone] Pager [phone]

## 2022-02-14 NOTE — Progress Notes (Signed)
Assessed for PICC with Dr. Valeta Harms at bedside.  Right cephalic is not completely compressible.  Brachial veins not seen, basilic vein is small with >45% vessel to catheter ratio.  Dr. Valeta Harms to proceed with different central line placement.

## 2022-02-14 NOTE — ED Notes (Signed)
Bair hugger removed from pt at this time.  

## 2022-02-14 NOTE — ED Notes (Signed)
MD at bedside d/t pts O2 sats 80% on vent, RT notified and to come to bedside, Primary RN and Charge RN remain with the pt

## 2022-02-14 NOTE — Progress Notes (Signed)
eLink Physician-Brief Progress Note Patient Name: Alice [name] DOB: 26-Apr-1985 MRN: [id number]   Date of Service  02/14/2022  HPI/Events of Note  K+ 2.3 (only has 2 runs of K+ left, & 43meq) Ca 6.4 (corrects to 8) Mag 1.9 Crt 2.88  eICU Interventions  Ordered additional potassium 1- meqs x 6 doses as current available correction will increase K to an estimate of 2.9. Ongoing bicarb drip Ordered magnesium 1 g IVPB Ordered calcium gluconate 1 g IVPB     Intervention Category Intermediate Interventions: Electrolyte abnormality - evaluation and management  Judd Lien 02/14/2022, 11:54 PM

## 2022-02-14 NOTE — Progress Notes (Signed)
LUE arterial duplex has been completed.  Preliminary results given to Roselyn Reef, RN.   Results can be found under chart review under CV PROC. 02/14/2022 6:22 PM Alonza Knisley RVT, RDMS

## 2022-02-14 NOTE — ED Notes (Signed)
Advancement complete. Pt is 24 @ the lip

## 2022-02-14 NOTE — Procedures (Signed)
Central Venous Catheter Insertion Procedure Note  Alice [name]  [id number]  April 07, 1985  Date:02/14/22  Time:4:05 PM   Provider Performing:Alice [name]   Procedure: Insertion of Non-tunneled Central Venous [phone]) with US guidance ([address])   Indication(s) Medication administration  Consent Risks of the procedure as well as the alternatives and risks of each were explained to the patient and/or caregiver.  Consent for the procedure was obtained and is signed in the bedside chart  Anesthesia Topical only with 1% lidocaine   Timeout Verified patient identification, verified procedure, site/side was marked, verified correct patient position, special equipment/implants available, medications/allergies/relevant history reviewed, required imaging and test results available.  Sterile Technique Maximal sterile technique including full sterile barrier drape, hand hygiene, sterile gown, sterile gloves, mask, hair covering, sterile ultrasound probe cover (if used).  Procedure Description Area of catheter insertion was cleaned with chlorhexidine and draped in sterile fashion.  With real-time ultrasound guidance a central venous catheter was placed into the right internal jugular vein. Nonpulsatile blood flow and easy flushing noted in all ports.  The catheter was sutured in place and sterile dressing applied.  Complications/Tolerance None; patient tolerated the procedure well. Chest X-ray is ordered to verify placement for internal jugular or subclavian cannulation.   Chest x-ray is not ordered for femoral cannulation.  EBL Minimal  Specimen(s) None   Alice Nash, DO [facility] 02/14/2022 4:06 PM

## 2022-02-14 NOTE — Progress Notes (Signed)
Patients head turned @2130  without any issues due to proning

## 2022-02-14 NOTE — H&P (Signed)
NAME:  Alice [name], MRN:  [id number], DOB:  Apr 20, 1985, LOS: 1 ADMISSION DATE:  02/13/2022, CONSULTATION DATE: 02/14/2022 REFERRING MD: Dr. Arbutus Leas, CHIEF COMPLAINT: Respiratory failure  History of Present Illness:  This is a 37 year old female, morbidly obese, BMI 54, history of fatty liver, gastroesophageal reflux history of kidney stones, chronic left flank pain.  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 [name] at [facility].  Patient presented to the emergency department with nausea vomiting diarrhea and a home flu test that was positive.  She was recently admitted to [facility] on 1210 through 1216 for a nongap metabolic acidosis and ulcerative lesions on the inner thighs suspected to fungal intertrigo.  Dermatology that saw her there did not feel the patient had pyoderma gangrenosum.  She was treated with fluconazole and discharged with 7 more days of fluconazole.  Recommended wound care with Aquacel Ag.  She was admitted previously at [facility] on 12/11/2021 through 12/30/2021 with cellulitis and Candida intertrigo and pneumonia with sepsis treated with IV antibiotics discharged on a 10-day door course of doxycycline.  She was seen in the emergency department her core temperature dropped to 94 she remained tachycardic and tachypneic.  Sodium was 133 BUN of 20, creatinine of 2.25 most recent in our system was within normal range.  Pregnancy test negative.  Lactic acidosis of 3.2.  She is found to be influenza be positive, influenza A COVID and RSV negative.  Patient's arterial blood gas showed a pH of 7.05, pCO2 of 30, PaO2 of 81, bicarb of 8.  She has a potassium of 3.0, leukocytosis of 18,000.  The patient was intubated and placed on mechanical life support prior to transfer.  Chest x-ray with bilateral interstitial infiltrates.  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 [name] at [facility]   Morbid obesity [facility])    Preterm labor    Round ligament pain 09/10/2012   Torn ACL      Significant Hospital Events: Including procedures, antibiotic start and stop dates in addition to other pertinent events     Interim History / Subjective:  Critically ill, intubated on mechanical life support  Objective   Blood pressure 135/72, pulse (!) 115, temperature 99.3 F (37.4 C), resp. rate (!) 30, height 5\' 9"  (1.753 m), weight (!) 167.4 kg, SpO2 99 %.    Vent Mode: PRVC FiO2 (%):  [30 %-100 %] 100 % Set Rate:  [35 bmp] 35 bmp Vt Set:  [530 mL] 530 mL PEEP:  [8 cmH20] 8 cmH20 Plateau Pressure:  [31 cmH20] 31 cmH20   Intake/Output Summary (Last 24 hours) at 02/14/2022 1203 Last data filed at 02/14/2022 1132 Gross per 24 hour  Intake 4205.73 ml  Output --  Net 4205.73 ml   Filed Weights   02/13/22 1148  Weight: (!) 167.4 kg    Examination: General: Obese FM, critically ill, intubated on life support  HENT: Endotracheal tube in place Lungs: Bilateral mechanically ventilated breath sounds Cardiovascular: Regular rhythm, S1-S2, tachycardic Abdomen: Obese,  soft nontender nondistended, rash and wound within the pannicular fold Extremities: No significant edema, left hand purple mottled likely IV infiltration Neuro: Sedated on mechanical support GU: Deferred  Resolved Hospital Problem list     Assessment & Plan:   Acute hypoxemic respiratory failure requiring intubation mechanical ventilation secondary to influenza B pneumonia Possible concomitant bacterial pneumonia Severe hypoxemia and acute respiratory distress syndrome Sepsis secondary to above, septic  shock Plan: Adult mechanical vent protocol ARDS strategy, low tidal volume ventilation As long as she has compliance with ventilator we can potentially just use as needed paralysis if needed. Continue Tamiflu x 5 days Broad-spectrum antimicrobial coverage with cefepime, metronidazole and vancomycin Norepinephrine infusion to maintain mean arterial pressure greater than 65 mmHg.  Acute Renal Failure  Secondary to Sepsis  Lactic Acidosis  P: Foley in place Monitor UOP  No acute indication at this time for RRT  Chronic Panniculitis  Open wound on abdomen  Chronic fungal intertrigo  P: WOC consult  Dressing changes PRN   Hyperammonemia  ?fatty liver disease  P: Lactulose Ordered RUQ [place]   Left hand, IV infiltration, Possibly infiltrated with calcium gluconate Plan: Discussed with pharmacy need for any injections, will remove peripheral IV.    Best Practice (right click and "Reselect all SmartList Selections" daily)   Diet/type: NPO DVT prophylaxis: prophylactic heparin  GI prophylaxis: PPI Lines: Central line Foley:  Yes, and it is still needed Code Status:  full code Last date of multidisciplinary goals of care discussion [updated patient's sister and brother-in-law at bedside.]  Labs   CBC: Recent Labs  Lab 02/13/22 1620 02/14/22 0841  WBC 30.0* 18.7*  NEUTROABS 26.1*  --   HGB 13.2 11.0*  HCT 41.2 33.7*  MCV 95.6 93.1  PLT 258 195    Basic Metabolic Panel: Recent Labs  Lab 02/13/22 1620 02/13/22 2131 02/14/22 0841  NA 133* 132* 135  K 2.8* 3.7 3.0*  CL 110 110 111  CO2 <7* <7* 8*  GLUCOSE 90 65* 105*  BUN 20 22* 27*  CREATININE 2.25* 2.31* 2.46*  CALCIUM 7.1* 6.4* 6.0*  MG  --   --  1.9  PHOS  --   --  4.7*   GFR: Estimated Creatinine Clearance: 52.7 mL/min (A) (by C-G formula based on SCr of 2.46 mg/dL (H)). Recent Labs  Lab 02/13/22 1620 02/13/22 1924 02/13/22 2131 02/14/22 0841  PROCALCITON  --   --   --  1.47  WBC 30.0*  --   --   18.7*  LATICACIDVEN  --  3.2* 1.4  --     Liver Function Tests: Recent Labs  Lab 02/14/22 0841  AST 46*  ALT 13  ALKPHOS 131*  BILITOT 0.7  PROT 5.0*  ALBUMIN 2.0*   No results for input(s): "LIPASE", "AMYLASE" in the last 168 hours. Recent Labs  Lab 02/14/22 0841  AMMONIA 97*    ABG    Component Value Date/Time   PHART 7.05 (LL) 02/14/2022 0941   PCO2ART 30 (L) 02/14/2022 0941   PO2ART 81 (L) 02/14/2022 0941   HCO3 8.3 (L) 02/14/2022 0941   TCO2 23 03/30/2017 1103   ACIDBASEDEF 21.1 (H) 02/14/2022 0941   O2SAT 96.9 02/14/2022 0941     Coagulation Profile: No results for input(s): "INR", "PROTIME" in the last 168 hours.  Cardiac Enzymes: No results for input(s): "CKTOTAL", "CKMB", "CKMBINDEX", "TROPONINI" in the last 168 hours.  HbA1C: No results found for: "HGBA1C"  CBG: Recent Labs  Lab 02/14/22 0743  GLUCAP 111*    Review of Systems:   Critically ill, unable to obtain.  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   Surgical History:   Past Surgical History:  Procedure Laterality Date   addenoidectomy     CESAREAN SECTION N/A 02/22/2013   Procedure: CESAREAN SECTION/TWINS;  Surgeon: Florian Buff, MD;  Location: [place];  Service: Obstetrics;  Laterality: N/A;   CHOLECYSTECTOMY     GALLBLADDER SURGERY  2019   NASAL SINUS SURGERY     NEPHRECTOMY     right side   OTHER SURGICAL HISTORY     ulner nerve removal   TONSILLECTOMY       Social History:   reports that she has been smoking cigarettes. She has a 0.25 pack-year smoking history.  She has never used smokeless tobacco. She reports that she does not drink alcohol and does not use drugs.   Family History:  Her family history includes Alcohol abuse in her brother and father; Diabetes in her maternal grandmother; Heart disease in her father and mother; Hyperlipidemia in her maternal grandmother and mother; Hypertension in her mother.   Allergies Allergies  Allergen Reactions   Cinnamon Anaphylaxis    Cinnamon flavoring    Other Swelling    Artificial cinnamon causes tongue to swell   Reglan [Metoclopramide] Other (See Comments)    Tactile disturbances REACTION: causes skin to "crawl"   Cinnamon Flavor Swelling    Artificial cinnamon causes tongue to swell   Codeine Nausea Only    Straight codeine only Percocet & vicodin are tolerated by pt.   Flomax [Tamsulosin] Other (See Comments)    Other reaction(s): Other (See Comments) Oral sores Oral sores   Ibuprofen Other (See Comments)    Stomach pain Causes severe pain in stomach Pt can tolerate if used sparingly   Levofloxacin Hives   Tylenol [Acetaminophen] Other (See Comments)    Pt reports Tylenol causes her kidney to bleed.     Home Medications  Prior to Admission medications   Medication Sig Start Date End Date Taking? Authorizing Provider  cholecalciferol (VITAMIN D3) 25 MCG (1000 UNIT) tablet Take by mouth. 12/20/21  Yes [provider]  clotrimazole-betamethasone (LOTRISONE) cream Apply topically 2 (two) times daily.   Yes [provider]  FLUoxetine (PROZAC) 40 MG capsule Take 1 capsule (40 mg total) by mouth daily. 11/02/18  Yes Rakes, Connye Burkitt, FNP  gabapentin (NEURONTIN) 300 MG capsule Take 600 mg by mouth 2 (two) times daily. 08/08/20  Yes [provider]  methocarbamol (ROBAXIN) 500 MG tablet Take by mouth. 01/29/22  Yes [provider]  montelukast (SINGULAIR) 10 MG tablet Take by mouth. 07/30/20  Yes [provider]  nystatin (MYCOSTATIN/NYSTOP) powder  Apply 1 Application topically 2 (two) times daily. 01/29/22  Yes [provider]  OLANZapine (ZYPREXA) 10 MG tablet Take 1 tablet (10 mg total) by mouth at bedtime. 01/24/19  Yes Rakes, Connye Burkitt, FNP  omeprazole (PRILOSEC) 40 MG capsule Take 1 capsule (40 mg total) by mouth daily. 07/05/19  Yes Hendricks Limes F, FNP  phentermine (ADIPEX-P) 37.5 MG tablet Take 37.5 mg by mouth every morning. 09/08/21  Yes [provider]  pravastatin (PRAVACHOL) 80 MG tablet Take by mouth. 08/06/19  Yes [provider]  Sodium Hypochlorite 0.0125 % SOLN Apply topically. 01/29/22  Yes [provider]  SUBOXONE 8-2 MG FILM Place 1 Film under the tongue 2 (two) times daily as needed (pain). 11/16/21  Yes [provider]  SUMAtriptan (IMITREX) 25 MG tablet Take 25 mg by mouth as needed.   Yes [provider]  traZODone (DESYREL) 50 MG tablet Take 2 tablets (100 mg total) by mouth at bedtime as needed. 03/01/19 02/13/22 Yes Rakes, Connye Burkitt, FNP  Diethylpropion HCl 25 MG TABS Take 1 tablet by mouth 3 (three) times daily. Patient not taking: Reported on 02/13/2022 11/02/21   [provider]  doxycycline (MONODOX) 100 MG capsule Take 100 mg by mouth every 12 (twelve) hours. Patient not taking: Reported on 02/13/2022 12/20/21   [provider]  gabapentin (NEURONTIN) 400 MG capsule Take by mouth. Patient not taking: Reported on 02/13/2022 11/12/21   [provider]  naloxone [facility]) nasal spray 4 mg/0.1 mL SMARTSIG:1 Spray(s) Both Nares PRN Patient not taking: Reported on 02/13/2022 10/12/21   [provider]  predniSONE (DELTASONE) 5 MG tablet Take by mouth. Patient not taking: Reported on 02/13/2022 09/16/21   [provider]     Critical care time:   This patient is critically ill with multiple organ system failure; which, requires frequent high complexity decision making, assessment, support, evaluation, and titration of  therapies. This was completed through the application of advanced monitoring technologies and extensive interpretation of multiple databases. During this encounter critical care time was devoted to patient care services described in this note for 80 minutes.

## 2022-02-14 NOTE — Progress Notes (Signed)
PCCM:  I updated family at bedside.  CVL and a-line placed Repeat pending ABG   Left hand present on admission worsening discoloration and mottling Injected with hyaluronidase by nursing  Discussed with Pharmacy  Will order right RUE arterial doppler    [name], DO [facility] 02/14/2022 4:09 PM

## 2022-02-14 NOTE — ED Notes (Signed)
Respiratory at bedside for tube advancement.

## 2022-02-15 ENCOUNTER — Inpatient Hospital Stay (HOSPITAL_COMMUNITY): Payer: Medicaid Other

## 2022-02-15 DIAGNOSIS — R0602 Shortness of breath: Secondary | ICD-10-CM

## 2022-02-15 DIAGNOSIS — J189 Pneumonia, unspecified organism: Secondary | ICD-10-CM | POA: Diagnosis not present

## 2022-02-15 LAB — BASIC METABOLIC PANEL
Anion gap: 17 — ABNORMAL HIGH (ref 5–15)
Anion gap: 15 (ref 5–15)
BUN: 40 mg/dL — ABNORMAL HIGH (ref 6–20)
BUN: 43 mg/dL — ABNORMAL HIGH (ref 6–20)
CO2: 15 mmol/L — ABNORMAL LOW (ref 22–32)
CO2: 16 mmol/L — ABNORMAL LOW (ref 22–32)
Calcium: 6.1 mg/dL — CL (ref 8.9–10.3)
Calcium: 6.5 mg/dL — ABNORMAL LOW (ref 8.9–10.3)
Chloride: 105 mmol/L (ref 98–111)
Chloride: 107 mmol/L (ref 98–111)
Creatinine, Ser: 2.91 mg/dL — ABNORMAL HIGH (ref 0.44–1.00)
Creatinine, Ser: 3.01 mg/dL — ABNORMAL HIGH (ref 0.44–1.00)
GFR, Estimated: 21 mL/min — ABNORMAL LOW (ref >60)
GFR, Estimated: 20 mL/min — ABNORMAL LOW (ref >60)
Glucose, Bld: 135 mg/dL — ABNORMAL HIGH (ref 70–99)
Glucose, Bld: 130 mg/dL — ABNORMAL HIGH (ref 70–99)
Potassium: 2.1 mmol/L — CL (ref 3.5–5.1)
Potassium: 2.2 mmol/L — CL (ref 3.5–5.1)
Sodium: 137 mmol/L (ref 135–145)
Sodium: 138 mmol/L (ref 135–145)

## 2022-02-15 LAB — BLOOD GAS, ARTERIAL
Acid-base deficit: 9.9 mmol/L — ABNORMAL HIGH (ref 0.0–2.0)
Acid-base deficit: 10.7 mmol/L — ABNORMAL HIGH (ref 0.0–2.0)
Acid-base deficit: 10.4 mmol/L — ABNORMAL HIGH (ref 0.0–2.0)
Bicarbonate: 15.3 mmol/L — ABNORMAL LOW (ref 20.0–28.0)
Bicarbonate: 15.9 mmol/L — ABNORMAL LOW (ref 20.0–28.0)
Bicarbonate: 16.3 mmol/L — ABNORMAL LOW (ref 20.0–28.0)
Drawn by: 29503
FIO2: 60 %
MECHVT: 460 mL
O2 Saturation: 100 %
O2 Saturation: 99.4 %
O2 Saturation: 98 %
PEEP: 8 cmH2O
Patient temperature: 37
Patient temperature: 37
Patient temperature: 37.2
RATE: 30 resp/min
pCO2 arterial: 31 mmHg — ABNORMAL LOW (ref 32–48)
pCO2 arterial: 37 mmHg (ref 32–48)
pCO2 arterial: 38 mmHg (ref 32–48)
pH, Arterial: 7.3 — ABNORMAL LOW (ref 7.35–7.45)
pH, Arterial: 7.24 — ABNORMAL LOW (ref 7.35–7.45)
pH, Arterial: 7.24 — ABNORMAL LOW (ref 7.35–7.45)
pO2, Arterial: 217 mmHg — ABNORMAL HIGH (ref 83–108)
pO2, Arterial: 142 mmHg — ABNORMAL HIGH (ref 83–108)
pO2, Arterial: 95 mmHg (ref 83–108)

## 2022-02-15 LAB — GLUCOSE, CAPILLARY
Glucose-Capillary: 108 mg/dL — ABNORMAL HIGH (ref 70–99)
Glucose-Capillary: 113 mg/dL — ABNORMAL HIGH (ref 70–99)
Glucose-Capillary: 126 mg/dL — ABNORMAL HIGH (ref 70–99)
Glucose-Capillary: 141 mg/dL — ABNORMAL HIGH (ref 70–99)
Glucose-Capillary: 118 mg/dL — ABNORMAL HIGH (ref 70–99)
Glucose-Capillary: 86 mg/dL (ref 70–99)
Glucose-Capillary: 106 mg/dL — ABNORMAL HIGH (ref 70–99)
Glucose-Capillary: 117 mg/dL — ABNORMAL HIGH (ref 70–99)
Glucose-Capillary: 125 mg/dL — ABNORMAL HIGH (ref 70–99)

## 2022-02-15 LAB — CBC
HCT: 29.4 % — ABNORMAL LOW (ref 36.0–46.0)
HCT: 27.1 % — ABNORMAL LOW (ref 36.0–46.0)
Hemoglobin: 10.1 g/dL — ABNORMAL LOW (ref 12.0–15.0)
Hemoglobin: 9.3 g/dL — ABNORMAL LOW (ref 12.0–15.0)
MCH: 30.3 pg (ref 26.0–34.0)
MCH: 30.5 pg (ref 26.0–34.0)
MCHC: 34.4 g/dL (ref 30.0–36.0)
MCHC: 34.3 g/dL (ref 30.0–36.0)
MCV: 88.3 fL (ref 80.0–100.0)
MCV: 88.9 fL (ref 80.0–100.0)
Platelets: 234 10*3/uL (ref 150–400)
Platelets: 200 10*3/uL (ref 150–400)
RBC: 3.33 MIL/uL — ABNORMAL LOW (ref 3.87–5.11)
RBC: 3.05 MIL/uL — ABNORMAL LOW (ref 3.87–5.11)
RDW: 15.8 % — ABNORMAL HIGH (ref 11.5–15.5)
RDW: 15.9 % — ABNORMAL HIGH (ref 11.5–15.5)
WBC: 18.2 10*3/uL — ABNORMAL HIGH (ref 4.0–10.5)
WBC: 15.7 10*3/uL — ABNORMAL HIGH (ref 4.0–10.5)
nRBC: 0.3 % — ABNORMAL HIGH (ref 0.0–0.2)
nRBC: 0.3 % — ABNORMAL HIGH (ref 0.0–0.2)

## 2022-02-15 LAB — ECHOCARDIOGRAM COMPLETE
Area-P 1/2: 4.85 cm2
Calc EF: 74.6 %
Height: 69 in
S' Lateral: 2.4 cm
Single Plane A2C EF: 66 %
Single Plane A4C EF: 79.1 %
Weight: 5904.8 oz

## 2022-02-15 LAB — PHOSPHORUS
Phosphorus: 3.6 mg/dL (ref 2.5–4.6)
Phosphorus: 3.4 mg/dL (ref 2.5–4.6)

## 2022-02-15 LAB — TRIGLYCERIDES: Triglycerides: 296 mg/dL — ABNORMAL HIGH (ref <150)

## 2022-02-15 LAB — MAGNESIUM
Magnesium: 2.1 mg/dL (ref 1.7–2.4)
Magnesium: 2.1 mg/dL (ref 1.7–2.4)

## 2022-02-15 LAB — OSMOLALITY: Osmolality: 293 mOsm/kg (ref 275–295)

## 2022-02-15 LAB — VANCOMYCIN, RANDOM: Vancomycin Rm: 23 ug/mL

## 2022-02-15 LAB — TROPONIN I (HIGH SENSITIVITY): Troponin I (High Sensitivity): 13 ng/L (ref <18)

## 2022-02-15 LAB — AMMONIA: Ammonia: 50 umol/L — ABNORMAL HIGH (ref 9–35)

## 2022-02-15 MED ORDER — POTASSIUM CHLORIDE 10 MEQ/50ML IV SOLN
10.0000 meq | INTRAVENOUS | Status: AC
  Administered 2022-02-15 – 2022-02-16 (×6): 10 meq via INTRAVENOUS
  Filled 2022-02-15 (×6): qty 50

## 2022-02-15 MED ORDER — CALCIUM GLUCONATE-NACL 2-0.675 GM/100ML-% IV SOLN
2.0000 g | Freq: Once | INTRAVENOUS | Status: AC
  Administered 2022-02-15: 2000 mg via INTRAVENOUS
  Filled 2022-02-15: qty 100

## 2022-02-15 MED ORDER — LACTATED RINGERS IV BOLUS
500.0000 mL | Freq: Once | INTRAVENOUS | Status: AC
  Administered 2022-02-15: 500 mL via INTRAVENOUS

## 2022-02-15 MED ORDER — ARTIFICIAL TEARS OPHTHALMIC OINT
TOPICAL_OINTMENT | Freq: Three times a day (TID) | OPHTHALMIC | Status: DC
  Administered 2022-02-21: 1 via OPHTHALMIC
  Filled 2022-02-15 (×2): qty 3.5

## 2022-02-15 MED ORDER — THIAMINE MONONITRATE 100 MG PO TABS
100.0000 mg | ORAL_TABLET | Freq: Every day | ORAL | Status: AC
  Administered 2022-02-15 – 2022-02-19 (×5): 100 mg
  Filled 2022-02-15 (×5): qty 1

## 2022-02-15 MED ORDER — ALBUMIN HUMAN 25 % IV SOLN
25.0000 g | Freq: Once | INTRAVENOUS | Status: AC
  Administered 2022-02-15: 25 g via INTRAVENOUS
  Filled 2022-02-15: qty 100

## 2022-02-15 MED ORDER — OSELTAMIVIR PHOSPHATE 6 MG/ML PO SUSR
30.0000 mg | Freq: Two times a day (BID) | ORAL | Status: DC
  Administered 2022-02-15 – 2022-02-18 (×7): 30 mg
  Filled 2022-02-15 (×8): qty 12.5

## 2022-02-15 MED ORDER — PANTOPRAZOLE SODIUM 40 MG IV SOLR
40.0000 mg | INTRAVENOUS | Status: DC
  Administered 2022-02-15 – 2022-03-03 (×17): 40 mg via INTRAVENOUS
  Filled 2022-02-15 (×17): qty 10

## 2022-02-15 MED ORDER — POTASSIUM CHLORIDE 20 MEQ PO PACK
40.0000 meq | PACK | ORAL | Status: AC
  Administered 2022-02-15 (×3): 40 meq
  Filled 2022-02-15 (×3): qty 2

## 2022-02-15 MED ORDER — MUPIROCIN 2 % EX OINT
1.0000 | TOPICAL_OINTMENT | Freq: Two times a day (BID) | CUTANEOUS | Status: AC
  Administered 2022-02-15 – 2022-02-19 (×10): 1 via NASAL
  Filled 2022-02-15: qty 22

## 2022-02-15 MED ORDER — OSELTAMIVIR PHOSPHATE 6 MG/ML PO SUSR
30.0000 mg | Freq: Two times a day (BID) | ORAL | Status: DC
  Filled 2022-02-15 (×2): qty 5

## 2022-02-15 MED ORDER — LINEZOLID 600 MG/300ML IV SOLN
600.0000 mg | Freq: Two times a day (BID) | INTRAVENOUS | Status: AC
  Administered 2022-02-15 – 2022-02-21 (×14): 600 mg via INTRAVENOUS
  Filled 2022-02-15 (×14): qty 300

## 2022-02-15 MED ORDER — GUAIFENESIN-DM 100-10 MG/5ML PO SYRP
15.0000 mL | ORAL_SOLUTION | Freq: Four times a day (QID) | ORAL | Status: DC
  Administered 2022-02-15 – 2022-02-22 (×29): 15 mL
  Filled 2022-02-15 (×29): qty 20

## 2022-02-15 MED ORDER — PROSOURCE TF20 ENFIT COMPATIBL EN LIQD
60.0000 mL | Freq: Two times a day (BID) | ENTERAL | Status: DC
  Administered 2022-02-15 – 2022-02-22 (×15): 60 mL
  Filled 2022-02-15 (×15): qty 60

## 2022-02-15 MED ORDER — SODIUM CHLORIDE 0.9 % IV SOLN: INTRAVENOUS | Status: DC | PRN

## 2022-02-15 MED ORDER — POTASSIUM CHLORIDE 10 MEQ/100ML IV SOLN
INTRAVENOUS | Status: AC
  Administered 2022-02-15: 10 meq via INTRAVENOUS
  Filled 2022-02-15: qty 100

## 2022-02-15 MED ORDER — OSELTAMIVIR PHOSPHATE 6 MG/ML PO SUSR
75.0000 mg | Freq: Once | ORAL | Status: AC
  Administered 2022-02-15: 75 mg
  Filled 2022-02-15: qty 12.5

## 2022-02-15 MED ORDER — CALCIUM GLUCONATE-NACL 1-0.675 GM/50ML-% IV SOLN
1.0000 g | Freq: Once | INTRAVENOUS | Status: AC
  Administered 2022-02-15: 1000 mg via INTRAVENOUS
  Filled 2022-02-15: qty 50

## 2022-02-15 MED ORDER — FUROSEMIDE 10 MG/ML IJ SOLN: 40.0000 mg | Freq: Once | INTRAMUSCULAR | Status: DC

## 2022-02-15 MED ORDER — NOREPINEPHRINE 4 MG/250ML-% IV SOLN
0.0000 ug/min | INTRAVENOUS | Status: DC
  Administered 2022-02-16: 12 ug/min via INTRAVENOUS
  Administered 2022-02-16: 11 ug/min via INTRAVENOUS
  Administered 2022-02-16: 10 ug/min via INTRAVENOUS
  Administered 2022-02-16: 11 ug/min via INTRAVENOUS
  Administered 2022-02-17: 5 ug/min via INTRAVENOUS
  Administered 2022-02-17: 8 ug/min via INTRAVENOUS
  Filled 2022-02-15 (×6): qty 250

## 2022-02-15 MED ORDER — ORAL CARE MOUTH RINSE: 15.0000 mL | OROMUCOSAL | Status: DC | PRN

## 2022-02-15 MED ORDER — VITAL 1.5 CAL PO LIQD: 1000.0000 mL | ORAL | Status: DC

## 2022-02-15 MED ORDER — PERFLUTREN LIPID MICROSPHERE
1.0000 mL | INTRAVENOUS | Status: AC | PRN
  Administered 2022-02-15: 2 mL via INTRAVENOUS

## 2022-02-15 MED ORDER — PIVOT 1.5 CAL PO LIQD
1000.0000 mL | ORAL | Status: DC
  Administered 2022-02-15 – 2022-02-16 (×2): 1000 mL
  Filled 2022-02-15 (×3): qty 1000

## 2022-02-15 MED ORDER — ORAL CARE MOUTH RINSE
15.0000 mL | OROMUCOSAL | Status: DC
  Administered 2022-02-15 – 2022-02-22 (×89): 15 mL via OROMUCOSAL

## 2022-02-15 NOTE — Progress Notes (Signed)
Assisted with placing PT in Supine position (from Prone) at approximately 1150- uneventful. Facial skin WNL at this time, ETT approx  24 cm lip (cloth tape in place for security). RT will secure ETT with [place] once determined she will remained Supine. RN aware.

## 2022-02-15 NOTE — Progress Notes (Signed)
Staff turned patients head per prone protocol @0000 . Patient tolerated well

## 2022-02-15 NOTE — Progress Notes (Signed)
eLink Physician-Brief Progress Note Patient Name: Alice [name] DOB: 06-Sep-1985 MRN: [id number]   Date of Service  02/15/2022  HPI/Events of Note  Request confirmation of OGT  eICU Interventions  Xray reviewed. Tip of feeding tube seen in the gastric cavity with side port at the GE junction. Push in feeding tube by 2 cm and should be ok to use Discussed with BSRN     Intervention Category Intermediate Interventions: Diagnostic test evaluation  Judd Lien 02/15/2022, 12:20 AM

## 2022-02-15 NOTE — Progress Notes (Signed)
Notified Lab that ABG being sent for analysis. 

## 2022-02-15 NOTE — Progress Notes (Signed)
BMP cancelled due to patient still getting potassium runs. Will place new order when runs are complete.

## 2022-02-15 NOTE — Progress Notes (Signed)
Arterial line placed by CCM 02/14/21 (RR). Line draws and flushes blood at this time, waveform good at this time, site WNL at this time. RN aware.

## 2022-02-15 NOTE — Progress Notes (Signed)
2D echo attempted, patient prone. Will try later

## 2022-02-15 NOTE — Progress Notes (Signed)
eLink Physician-Brief Progress Note Patient Name: LYSANDRA LOUGHMILLER DOB: 08-26-1985 MRN: [id number]   Date of Service  02/15/2022  HPI/Events of Note  Hypotension - Nursing request to change Norepinephrine IV infusion orders from PIV to via central line.   eICU Interventions  Will change Norepinephrine IV infusion orders to via central line.      Intervention Category Major Interventions: Hypotension - evaluation and management  Lysle Dingwall 02/15/2022, 8:41 PM

## 2022-02-15 NOTE — Progress Notes (Signed)
NAME:  Alice [name], MRN:  [id number], DOB:  [birth year], LOS: 2 ADMISSION DATE:  02/13/2022, CONSULTATION DATE: 02/14/2022 REFERRING MD: Dr. Arbutus Leas, CHIEF COMPLAINT: Respiratory failure  History of Present Illness:  This is a 37 year old female, morbidly obese, BMI 54, history of fatty liver, gastroesophageal reflux history of kidney stones, chronic left flank pain.  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 [name] at [facility].  Patient presented to the emergency department with nausea vomiting diarrhea and a home flu test that was positive.  She was recently admitted to [facility] on 1210 through 1216 for a nongap metabolic acidosis and ulcerative lesions on the inner thighs suspected to fungal intertrigo.  Dermatology that saw her there did not feel the patient had pyoderma gangrenosum.  She was treated with fluconazole and discharged with 7 more days of fluconazole.  Recommended wound care with Aquacel Ag.  She was admitted previously at [facility] on 12/11/2021 through 12/30/2021 with cellulitis and Candida intertrigo and pneumonia with sepsis treated with IV antibiotics discharged on a 10-day door course of doxycycline.  She was seen in the emergency department her core temperature dropped to 94 she remained tachycardic and tachypneic.  Sodium was 133 BUN of 20, creatinine of 2.25 most recent in our system was within normal range.  Pregnancy test negative.  Lactic acidosis of 3.2.  She is found to be influenza be positive, influenza A COVID and RSV negative.  Patient's arterial blood gas showed a pH of 7.05, pCO2 of 30, PaO2 of 81, bicarb of 8.  She has a potassium of 3.0, leukocytosis of 18,000.  The patient was intubated and placed on mechanical life support prior to transfer.  Chest x-ray with bilateral interstitial infiltrates.  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 [name] at [facility]   Morbid obesity [facility])    Preterm labor    Round ligament pain 09/10/2012   Torn ACL      Significant Hospital Events: Including procedures, antibiotic start and stop dates in addition to other pertinent events   1/1 admitted with severe ARDS, influenza pneumonia  Interim History / Subjective:  Critically ill, intubated on mechanical life support, proned.  FiO2 weaned overnight.  PF ratio markedly improved when prone compared to supine.  Objective   Blood pressure (!) 128/39, pulse (!) 107, temperature 98.8 F (37.1 C), resp. rate (!) 30, height 5\' 9"  (1.753 m), weight (!) 167.4 kg, SpO2 100 %.    Vent Mode: PRVC FiO2 (%):  [60 %-80 %] 60 % Set Rate:  [30 bmp] 30 bmp Vt Set:  [460 mL] 460 mL PEEP:  [8 cmH20] 8 cmH20 Plateau Pressure:  [21 cmH20-22 cmH20] 21 cmH20   Intake/Output Summary (Last 24 hours) at 02/15/2022 1018 Last data filed at 02/15/2022 0900 Gross per 24 hour  Intake 4748.24 ml  Output 3575 ml  Net 1173.24 ml    Filed Weights   02/13/22 1148  Weight: (!) 167.4 kg    Examination: General: Obese FM,  critically ill, intubated on life support  HENT: Endotracheal tube in place Lungs: Bilateral mechanically ventilated breath sounds Cardiovascular: Regular rhythm, S1-S2, tachycardic Abdomen: Obese, soft nontender nondistended, rash and wound within the pannicular fold Extremities: No significant edema, left hand purple mottled likely IV infiltration Neuro: Sedated on mechanical support GU: Deferred  Resolved Hospital Problem list     Assessment & Plan:   Acute hypoxemic respiratory failure requiring intubation mechanical ventilation secondary to  influenza B pneumonia Possible concomitant bacterial pneumonia Severe hypoxemia and acute respiratory distress syndrome Sepsis secondary to above, septic shock Plan: Adult mechanical vent protocol ARDS strategy, low tidal volume ventilation, currently limited to 7 cc per KG given acidemia As long as she has compliance with ventilator we can potentially just use as needed paralysis if needed. Continue Tamiflu x 5 days Broad-spectrum antimicrobial coverage with cefepime, linezolid 7 days planned Norepinephrine infusion to maintain mean arterial pressure greater than 65 mmHg Proned p.m. 1/1 with marked improvement in P: F ratio, supinate this morning, blood gas before and after Plan to continue diuresis pending lab results If PE: F ratio remains greater than 150 while supine no plans of further proning  Acute Renal Failure  Secondary to Sepsis  Lactic Acidosis  P: Foley in place Monitor UOP , robust output to Lasix 1/1, 1/2 is encouraging Repeat electrolytes, creatinine pending  Chronic Panniculitis  Open wound on abdomen  Chronic fungal intertrigo  P: WOC consult  Dressing changes PRN   Hyperammonemia  ?fatty liver disease  P: Lactulose 1/1 RUQ [place] with hepatic steatosis Limited evaluation due to habitus  Left hand, IV infiltration, Possibly infiltrated with calcium gluconate Plan: Discussed with pharmacy need for any injections, peripheral IV removed    Best Practice (right click and "Reselect all SmartList Selections" daily)   Diet/type: NPO DVT prophylaxis: prophylactic heparin  GI prophylaxis: PPI Lines: Central line Foley:  Yes, and it is still needed Code Status:  full code Last date of multidisciplinary goals of care discussion [updated patient's sister and brother-in-law at bedside.]  Labs   CBC: Recent Labs  Lab 02/13/22 1620 02/14/22 0841 02/14/22 1642 02/15/22 0433  WBC 30.0* 18.7* 19.2* 18.2*  NEUTROABS 26.1*  --   --   --   HGB 13.2 11.0* 10.9*  10.1*  HCT 41.2 33.7* 31.8* 29.4*  MCV 95.6 93.1 89.3 88.3  PLT 258 195 219 234     Basic Metabolic Panel: Recent Labs  Lab 02/13/22 1620 02/13/22 2131 02/14/22 0841 02/14/22 1642 02/14/22 2258 02/15/22 0433  NA 133* 132* 135 136 136  --   K 2.8* 3.7 3.0* 2.2* 2.3*  --   CL 110 110 111 109 108  --   CO2 <7* <7* 8* 9* 12*  --   GLUCOSE 90 65* 105* 137* 132*  --   BUN 20 22* 27* 32* 36*  --   CREATININE 2.25* 2.31* 2.46* 2.78* 2.88*  --   CALCIUM 7.1* 6.4* 6.0* 5.8* 6.4*  --   MG  --   --  1.9  --  1.9 2.1  PHOS  --   --  4.7*  --   --   --     GFR: Estimated Creatinine Clearance: 45.1 mL/min (A) (by C-G formula based on SCr of 2.88 mg/dL (H)). Recent Labs  Lab 02/13/22 1620 02/13/22 1924 02/13/22 2131 02/14/22 0841 02/14/22 1642 02/15/22 0433  PROCALCITON  --   --   --  1.47  --   --  WBC 30.0*  --   --  18.7* 19.2* 18.2*  LATICACIDVEN  --  3.2* 1.4  --  1.1  --      Liver Function Tests: Recent Labs  Lab 02/14/22 0841 02/14/22 1642  AST 46* 42*  ALT 13 14  ALKPHOS 131* 142*  BILITOT 0.7 0.4  PROT 5.0* 5.2*  ALBUMIN 2.0* 2.0*    No results for input(s): "LIPASE", "AMYLASE" in the last 168 hours. Recent Labs  Lab 02/14/22 0841 02/15/22 0419  AMMONIA 97* 50*     ABG    Component Value Date/Time   PHART 7.14 (LL) 02/14/2022 2042   PCO2ART 34 02/14/2022 2042   PO2ART 212 (H) 02/14/2022 2042   HCO3 11.6 (L) 02/14/2022 2042   TCO2 23 03/30/2017 1103   ACIDBASEDEF 16.3 (H) 02/14/2022 2042   O2SAT 99.9 02/14/2022 2042     Coagulation Profile: No results for input(s): "INR", "PROTIME" in the last 168 hours.  Cardiac Enzymes: No results for input(s): "CKTOTAL", "CKMB", "CKMBINDEX", "TROPONINI" in the last 168 hours.  HbA1C: No results found for: "HGBA1C"  CBG: Recent Labs  Lab 02/14/22 1607 02/14/22 2242 02/15/22 0035 02/15/22 0354 02/15/22 0735  GLUCAP 121* 126* 113* 141* 108*     Review of Systems:   Critically ill, unable to  obtain.  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   Surgical History:   Past Surgical History:  Procedure Laterality Date   addenoidectomy     CESAREAN SECTION N/A 02/22/2013   Procedure: CESAREAN SECTION/TWINS;  Surgeon: Florian Buff, MD;  Location: [place] [facility];  Service: Obstetrics;  Laterality: N/A;   CHOLECYSTECTOMY     GALLBLADDER SURGERY  2019   NASAL SINUS SURGERY     NEPHRECTOMY     right side   OTHER SURGICAL HISTORY     ulner nerve removal   TONSILLECTOMY       Social History:   reports that she has been smoking cigarettes. She has a 0.25 pack-year smoking history. She has never used smokeless tobacco. She reports that she does not drink alcohol and does not use drugs.   Family History:  Her family history includes Alcohol abuse in her brother and father; Diabetes in her maternal grandmother; Heart disease in her father and mother; Hyperlipidemia in her maternal grandmother and mother; Hypertension in her mother.   Allergies Allergies  Allergen Reactions   Cinnamon Anaphylaxis    Cinnamon flavoring    Other Swelling    Artificial cinnamon causes tongue to swell   Reglan [Metoclopramide] Other (See Comments)    Tactile disturbances REACTION: causes skin to "crawl"   Cinnamon Flavor Swelling    Artificial cinnamon causes tongue to swell   Codeine Nausea Only    Straight codeine only Percocet & vicodin are tolerated by pt.   Flomax [Tamsulosin] Other (See Comments)    Other reaction(s): Other (See Comments) Oral sores Oral sores   Ibuprofen Other (See  Comments)    Stomach pain Causes severe pain in stomach Pt can tolerate if used sparingly   Levofloxacin Hives   Tylenol [Acetaminophen] Other (See Comments)    Pt reports Tylenol causes her  kidney to bleed.     Home Medications  Prior to Admission medications   Medication Sig Start Date End Date Taking? Authorizing Provider  cholecalciferol (VITAMIN D3) 25 MCG (1000 UNIT) tablet Take by mouth. 12/20/21  Yes [provider]  clotrimazole-betamethasone (LOTRISONE) cream Apply topically 2 (two) times daily.   Yes [provider]  FLUoxetine (PROZAC) 40 MG capsule Take 1 capsule (40 mg total) by mouth daily. 11/02/18  Yes Rakes, Connye Burkitt, FNP  gabapentin (NEURONTIN) 300 MG capsule Take 600 mg by mouth 2 (two) times daily. 08/08/20  Yes [provider]  methocarbamol (ROBAXIN) 500 MG tablet Take by mouth. 01/29/22  Yes [provider]  montelukast (SINGULAIR) 10 MG tablet Take by mouth. 07/30/20  Yes [provider]  nystatin (MYCOSTATIN/NYSTOP) powder Apply 1 Application topically 2 (two) times daily. 01/29/22  Yes [provider]  OLANZapine (ZYPREXA) 10 MG tablet Take 1 tablet (10 mg total) by mouth at bedtime. 01/24/19  Yes Rakes, Connye Burkitt, FNP  omeprazole (PRILOSEC) 40 MG capsule Take 1 capsule (40 mg total) by mouth daily. 07/05/19  Yes Hendricks Limes F, FNP  phentermine (ADIPEX-P) 37.5 MG tablet Take 37.5 mg by mouth every morning. 09/08/21  Yes [provider]  pravastatin (PRAVACHOL) 80 MG tablet Take by mouth. 08/06/19  Yes [provider]  Sodium Hypochlorite 0.0125 % SOLN Apply topically. 01/29/22  Yes [provider]  SUBOXONE 8-2 MG FILM Place 1 Film under the tongue 2 (two) times daily as needed (pain). 11/16/21  Yes [provider]  SUMAtriptan (IMITREX) 25 MG tablet Take 25 mg by mouth as needed.   Yes [provider]  traZODone (DESYREL) 50 MG tablet Take 2 tablets (100 mg total) by mouth  at bedtime as needed. 03/01/19 02/13/22 Yes Rakes, Connye Burkitt, FNP  Diethylpropion HCl 25 MG TABS Take 1 tablet by mouth 3 (three) times daily. Patient not taking: Reported on 02/13/2022 11/02/21   [provider]  doxycycline (MONODOX) 100 MG capsule Take 100 mg by mouth every 12 (twelve) hours. Patient not taking: Reported on 02/13/2022 12/20/21   [provider]  gabapentin (NEURONTIN) 400 MG capsule Take by mouth. Patient not taking: Reported on 02/13/2022 11/12/21   [provider]  naloxone [facility]) nasal spray 4 mg/0.1 mL SMARTSIG:1 Spray(s) Both Nares PRN Patient not taking: Reported on 02/13/2022 10/12/21   [provider]  predniSONE (DELTASONE) 5 MG tablet Take by mouth. Patient not taking: Reported on 02/13/2022 09/16/21   [provider]     Critical care time:      CRITICAL CARE Performed by: Lanier Clam   Total critical care time: 40 minutes  Critical care time was exclusive of separately billable procedures and treating other patients.  Critical care was necessary to treat or prevent imminent or life-threatening deterioration.  Critical care was time spent personally by me on the following activities: development of treatment plan with patient and/or surrogate as well as nursing, discussions with consultants, evaluation of patient's response to treatment, examination of patient, obtaining history from patient or surrogate, ordering and performing treatments and interventions, ordering and review of laboratory studies, ordering and review of radiographic studies, pulse oximetry and re-evaluation of patient's condition.   Lanier Clam, MD [facility] 02/15/22 10:35 AM  See Amion for contact info

## 2022-02-15 NOTE — Progress Notes (Signed)
Initial Nutrition Assessment  DOCUMENTATION CODES:   Morbid obesity  INTERVENTION:  - Trickle TF only at this time per CCM. 46mL/hr due to persistent low K+   - Monitor magnesium, potassium, and phosphorus BID for at least 3 days, MD to replete as needed.  - 100mg  thiamine x5 days to aid in macronutrient metabolism.    - Once medically appropriate to advance past trickles, recommend below TF regimen: Tube feeding via OG: Pivot 1.5 at 50 ml/h (1200 ml per day) *Once able to advance, recommend 8mL Q8H advancements Prosource TF20 60 ml daily Provides 1880 kcal, 132 gm protein, 900 ml free water daily   NUTRITION DIAGNOSIS:   Inadequate oral intake related to inability to eat as evidenced by NPO status (on vent).  GOAL:   Patient will meet greater than or equal to 90% of their needs  MONITOR:   Vent status, Labs, TF tolerance  REASON FOR ASSESSMENT:   Consult Enteral/tube feeding initiation and management (trickle tube feed)  ASSESSMENT:   37 y.o. 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    12/31: Admit 1/1: Intubated  Pt intubated and sedated at time of visit. In prone position.   Sister at bedside. Sister reports she is unsure of her sister's UBW but states she has lost a lot of weight over the past year. Notes that the patient was put on a "diet pill" but her doctor and so she was initially losing intentional weight (6-12# a month per sister). However, over the past few weeks patient has been feeling sick and eating a lot less so was having increased weight loss. States the patient was having to force herself to eat at that time.  Discussed plan to start tube feeds today, addressed all questions.   Per discussion with CCM MD, plan to only start  trickles at this time. As patient has had consistently low K+ and has been losing weight/eating poorly, will start at only 44mL/hr and add 100mg  thiamine. Discussed plan with MD and RN.  OG tube tip in stomach per MD read of xray. Warren Lacy note by MD early this AM indicates tube needed to be pushed by 2cm and note indicates this was discussed with RN.   Medications reviewed and include: Colace, Lasix, Miralax Fentanyl Propofol @ 25.74mL/hr (663 kcals over 24 hours)   Labs reviewed:   Latest Reference Range & Units 02/14/22 16:42 02/14/22 22:58 02/15/22 04:33 02/15/22 12:36  Potassium 3.5 - 5.1 mmol/L 2.2 (LL) 2.3 (LL)  2.1 (LL)  Creatinine 0.44 - 1.00 mg/dL 2.78 (H) 2.88 (H)  2.91 (H)  Calcium 8.9 - 10.3 mg/dL 5.8 (LL) 6.4 (LL)  6.1 (LL)  Phosphorus 2.5 - 4.6 mg/dL    3.6  Magnesium 1.7 - 2.4 mg/dL  1.9 2.1   (LL): Data is critically low (L): Data is abnormally low (H): Data is abnormally high   NUTRITION - FOCUSED PHYSICAL EXAM:  Flowsheet Row Most Recent Value  Orbital Region No depletion  Upper Arm Region No depletion  Thoracic and Lumbar Region No depletion  Buccal Region No depletion  Temple Region No depletion  Clavicle Bone Region Unable to assess  [pt in prone position]  Clavicle and Acromion Bone Region No depletion  Scapular Bone Region No depletion  Dorsal Hand No depletion  Patellar Region Unable to assess  [pt in prone position]  Anterior Thigh Region Unable to assess  [pt in prone position]  Posterior Calf Region Unable to assess  [pt in prone position]  Edema (RD Assessment) Mild  Hair Reviewed  Eyes Unable to assess  Mouth Unable to assess  Skin Reviewed  Nails Reviewed       Diet Order:   Diet Order             Diet NPO time specified  Diet effective now                   EDUCATION NEEDS:  Not appropriate for education at this time  Skin:  Skin Assessment: Skin Integrity Issues: Skin Integrity Issues:: Other (Comment) Other: (ITD)  Intertriginous Dermatitis on Abdomen  Last BM:  PTA  Height:  Ht Readings from Last 1 Encounters:  02/15/22 5\' 9"  (1.753 m)   Weight:  Wt Readings from Last 1 Encounters:  02/13/22 (!) 167.4 kg   Ideal Body Weight:  65.91 kg  BMI:  Body mass index is 54.5 kg/m.  Estimated Nutritional Needs:  Kcal:  5631-4970 kcals while intubated Protein:  120-135 grams Fluid:  >/= 1.8L    Samson Frederic RD, LDN For contact information, refer to [facility].

## 2022-02-15 NOTE — Progress Notes (Signed)
eLink Physician-Brief Progress Note Patient Name: Alice [name] DOB: 05/24/1985 MRN: [id number]   Date of Service  02/15/2022  HPI/Events of Note  Received request for lacrilube  eICU Interventions  Lacrilube OU q 8 ordered     Intervention Category Intermediate Interventions: Diagnostic test evaluation Minor Interventions: Routine modifications to care plan (e.g. PRN medications for pain, fever)  Shona Needles Fianna Snowball 02/15/2022, 12:23 AM

## 2022-02-15 NOTE — Progress Notes (Signed)
eLink Physician-Brief Progress Note Patient Name: Alice [name] DOB: [birth year] MRN: [id number]   Date of Service  02/15/2022  HPI/Events of Note  Hypokalemia - K+ = 2.2 and Creatinine = 3.01.   eICU Interventions  Will replace K+.      Intervention Category Major Interventions: Electrolyte abnormality - evaluation and management  [name] 02/15/2022, 10:24 PM

## 2022-02-15 NOTE — Progress Notes (Signed)
Patient head turned per prone protocol. Patient tolerated well

## 2022-02-15 NOTE — Progress Notes (Signed)
Removed cloth tape from securing ETT and replaced with Holister ETT holder- uneventful. ETT resides at 24 cm lip. Skin on face is WNL at this time.

## 2022-02-15 NOTE — Progress Notes (Signed)
  Echocardiogram 2D Echocardiogram has been performed.  Alice [name] 02/15/2022, 3:15 PM

## 2022-02-16 DIAGNOSIS — J189 Pneumonia, unspecified organism: Secondary | ICD-10-CM | POA: Diagnosis not present

## 2022-02-16 LAB — CBC
HCT: 29 % — ABNORMAL LOW (ref 36.0–46.0)
Hemoglobin: 9.5 g/dL — ABNORMAL LOW (ref 12.0–15.0)
MCH: 30.3 pg (ref 26.0–34.0)
MCHC: 32.8 g/dL (ref 30.0–36.0)
MCV: 92.4 fL (ref 80.0–100.0)
Platelets: 192 10*3/uL (ref 150–400)
RBC: 3.14 MIL/uL — ABNORMAL LOW (ref 3.87–5.11)
RDW: 16.6 % — ABNORMAL HIGH (ref 11.5–15.5)
WBC: 13 10*3/uL — ABNORMAL HIGH (ref 4.0–10.5)
nRBC: 0 % (ref 0.0–0.2)

## 2022-02-16 LAB — URINALYSIS, COMPLETE (UACMP) WITH MICROSCOPIC
Bilirubin Urine: NEGATIVE
Glucose, UA: NEGATIVE mg/dL
Ketones, ur: NEGATIVE mg/dL
Nitrite: NEGATIVE
Protein, ur: 100 mg/dL — AB
Specific Gravity, Urine: 1.011 (ref 1.005–1.030)
WBC, UA: >50 WBC/hpf — ABNORMAL HIGH (ref 0–5)
pH: 6 (ref 5.0–8.0)

## 2022-02-16 LAB — GLUCOSE, CAPILLARY
Glucose-Capillary: 125 mg/dL — ABNORMAL HIGH (ref 70–99)
Glucose-Capillary: 102 mg/dL — ABNORMAL HIGH (ref 70–99)
Glucose-Capillary: 107 mg/dL — ABNORMAL HIGH (ref 70–99)
Glucose-Capillary: 134 mg/dL — ABNORMAL HIGH (ref 70–99)

## 2022-02-16 LAB — BASIC METABOLIC PANEL
Anion gap: 13 (ref 5–15)
Anion gap: 13 (ref 5–15)
BUN: 41 mg/dL — ABNORMAL HIGH (ref 6–20)
BUN: 39 mg/dL — ABNORMAL HIGH (ref 6–20)
CO2: 15 mmol/L — ABNORMAL LOW (ref 22–32)
CO2: 14 mmol/L — ABNORMAL LOW (ref 22–32)
Calcium: 6.5 mg/dL — ABNORMAL LOW (ref 8.9–10.3)
Calcium: 6.7 mg/dL — ABNORMAL LOW (ref 8.9–10.3)
Chloride: 111 mmol/L (ref 98–111)
Chloride: 112 mmol/L — ABNORMAL HIGH (ref 98–111)
Creatinine, Ser: 3.07 mg/dL — ABNORMAL HIGH (ref 0.44–1.00)
Creatinine, Ser: 2.94 mg/dL — ABNORMAL HIGH (ref 0.44–1.00)
GFR, Estimated: 19 mL/min — ABNORMAL LOW (ref >60)
GFR, Estimated: 20 mL/min — ABNORMAL LOW (ref >60)
Glucose, Bld: 108 mg/dL — ABNORMAL HIGH (ref 70–99)
Glucose, Bld: 102 mg/dL — ABNORMAL HIGH (ref 70–99)
Potassium: 2.9 mmol/L — ABNORMAL LOW (ref 3.5–5.1)
Potassium: 3.2 mmol/L — ABNORMAL LOW (ref 3.5–5.1)
Sodium: 139 mmol/L (ref 135–145)
Sodium: 139 mmol/L (ref 135–145)

## 2022-02-16 LAB — NA AND K (SODIUM & POTASSIUM), RAND UR
Potassium Urine: 36 mmol/L
Sodium, Ur: <10 mmol/L

## 2022-02-16 LAB — MAGNESIUM
Magnesium: 2.1 mg/dL (ref 1.7–2.4)
Magnesium: 2.1 mg/dL (ref 1.7–2.4)

## 2022-02-16 LAB — TSH: TSH: 2.384 u[IU]/mL (ref 0.350–4.500)

## 2022-02-16 LAB — PHOSPHORUS
Phosphorus: 2.5 mg/dL (ref 2.5–4.6)
Phosphorus: 1.9 mg/dL — ABNORMAL LOW (ref 2.5–4.6)

## 2022-02-16 LAB — CALCIUM, IONIZED: Calcium, Ionized, Serum: 3.2 mg/dL — ABNORMAL LOW (ref 4.5–5.6)

## 2022-02-16 MED ORDER — FUROSEMIDE 10 MG/ML IJ SOLN
80.0000 mg | Freq: Once | INTRAMUSCULAR | Status: AC
  Administered 2022-02-16: 80 mg via INTRAVENOUS
  Filled 2022-02-16: qty 8

## 2022-02-16 MED ORDER — CALCIUM GLUCONATE-NACL 2-0.675 GM/100ML-% IV SOLN
2.0000 g | Freq: Once | INTRAVENOUS | Status: AC
  Administered 2022-02-16: 2000 mg via INTRAVENOUS
  Filled 2022-02-16: qty 100

## 2022-02-16 MED ORDER — POTASSIUM CHLORIDE 20 MEQ PO PACK
40.0000 meq | PACK | ORAL | Status: AC
  Administered 2022-02-16 (×2): 40 meq
  Filled 2022-02-16 (×2): qty 2

## 2022-02-16 MED ORDER — POTASSIUM PHOSPHATES 15 MMOLE/5ML IV SOLN
30.0000 mmol | Freq: Once | INTRAVENOUS | Status: AC
  Administered 2022-02-16: 30 mmol via INTRAVENOUS
  Filled 2022-02-16: qty 10

## 2022-02-16 MED ORDER — POTASSIUM CHLORIDE 20 MEQ PO PACK
40.0000 meq | PACK | Freq: Once | ORAL | Status: AC
  Administered 2022-02-16: 40 meq
  Filled 2022-02-16: qty 2

## 2022-02-16 NOTE — Progress Notes (Signed)
Notified e-link of critical K+: 2.2

## 2022-02-16 NOTE — Progress Notes (Signed)
  Transition of Care (TOC) Screening Note   Patient Details  Name: Alice [name] Date of Birth: November 05, 1985   Transition of [facility]) CM/SW Contact:    Roseanne Kaufman, RN Phone Number: 02/16/2022, 1:30 PM    Transition of Care Department [facility]) has reviewed patient and no TOC needs have been identified at this time. We will continue to monitor patient advancement through interdisciplinary progression rounds. If new patient transition needs arise, please place a TOC consult.

## 2022-02-16 NOTE — Progress Notes (Signed)
NAME:  Alice [name], MRN:  [id number], DOB:  [birth year], LOS: 3 ADMISSION DATE:  02/13/2022, CONSULTATION DATE: 02/14/2022 REFERRING MD: Dr. Carles Collet, CHIEF COMPLAINT: Respiratory failure  History of Present Illness:  This is a 37 year old female, morbidly obese, BMI 54, history of fatty liver, gastroesophageal reflux history of kidney stones, chronic left flank pain.  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 Amalia Hailey at [facility].  Patient presented to the emergency department with nausea vomiting diarrhea and a home flu test that was positive.  She was recently admitted to [facility] on 1210 through 1216 for a nongap metabolic acidosis and ulcerative lesions on the inner thighs suspected to fungal intertrigo.  Dermatology that saw her there did not feel the patient had pyoderma gangrenosum.  She was treated with fluconazole and discharged with 7 more days of fluconazole.  Recommended wound care with Aquacel Ag.  She was admitted previously at [facility] on 12/11/2021 through 12/30/2021 with cellulitis and Candida intertrigo and pneumonia with sepsis treated with IV antibiotics discharged on a 10-day door course of doxycycline.  She was seen in the emergency department her core temperature dropped to 94 she remained tachycardic and tachypneic.  Sodium was 133 BUN of 20, creatinine of 2.25 most recent in our system was within normal range.  Pregnancy test negative.  Lactic acidosis of 3.2.  She is found to be influenza be positive, influenza A COVID and RSV negative.  Patient's arterial blood gas showed a pH of 7.05, pCO2 of 30, PaO2 of 81, bicarb of 8.  She has a potassium of 3.0, leukocytosis of 18,000.  The patient was intubated and placed on mechanical life support prior to transfer.  Chest x-ray with bilateral interstitial infiltrates.  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 Amalia Hailey at [place] [facility]    Preterm labor    Round ligament pain 09/10/2012   Torn ACL      Significant Hospital Events: Including procedures, antibiotic start and stop dates in addition to other pertinent events   1/1 admitted with severe ARDS, influenza pneumonia 1/2 supinated, O2 better, ventilation better, renal function stable but with AKI  Interim History / Subjective:  Critically ill, intubated on mechanical life support.  Objective   Blood pressure (!) 118/48, pulse (!) 110, temperature 99.1 F (37.3 C), resp. rate (!) 33, height 5\' 9"  (1.753 m), weight (!) 167.4 kg, SpO2 94 %.    Vent Mode: PRVC FiO2 (%):  [40 %-50 %] 40 % Set Rate:  [30 bmp] 30 bmp Vt Set:  [460 mL] 460 mL PEEP:  [8 cmH20] 8 cmH20 Plateau Pressure:  [21 cmH20-29 cmH20] 29 cmH20   Intake/Output Summary (Last 24 hours) at 02/16/2022 0830 Last data filed at 02/16/2022 0800 Gross per 24 hour  Intake 5648.11 ml  Output 3625 ml  Net 2023.11 ml    Filed Weights   02/13/22 1148  Weight: (!) 167.4 kg    Examination: General: Obese FM, critically ill, intubated  on life support  HENT: Endotracheal tube in place Lungs: Bilateral mechanically ventilated breath sounds Cardiovascular: Regular rhythm, S1-S2, tachycardic Abdomen: Obese, soft nontender nondistended, rash and wound within the pannicular fold Extremities: No significant edema, left hand purple mottled likely IV infiltration Neuro: Sedated on mechanical support GU: Deferred  Resolved Hospital Problem list     Assessment & Plan:   Acute hypoxemic respiratory failure requiring intubation mechanical ventilation secondary to influenza B  pneumonia Possible concomitant bacterial pneumonia Severe hypoxemia and acute respiratory distress syndrome Sepsis secondary to above, septic shock Plan: Adult mechanical vent protocol ARDS strategy, low tidal volume ventilation, currently limited to 7 cc per KG given acidemia, consider PS trials given improvement in FiO2 Continue Tamiflu x 5 days Broad-spectrum antimicrobial coverage with cefepime, linezolid 7 days planned Norepinephrine infusion to maintain mean arterial pressure greater than 65 mmHg Proned p.m. 1/1 with marked improvement in P: F ratio, supinated 1/2, blood gas before and after Resume diuresis  Acute Renal Failure  Secondary to Sepsis  Metabolic acidosis due to renal failure P: Foley in place Monitor UOP , robust output to Lasix 1/1, 1/2 is encouraging Lasix holiday afternoon 1/2 given mild hypotension, additional Lasix today given ongoing AKI and positive fluid balance  Chronic Panniculitis  Open wound on abdomen  Chronic fungal intertrigo  P: WOC consult  Dressing changes PRN   Hyperammonemia  ?fatty liver disease  P: Lactulose 1/1 RUQ [place] with hepatic steatosis Limited evaluation due to habitus  Hypokalemia, hypocalcemia: Persistent.  Despite significant replacement.  Suspect total body stores exceedingly low, likely nutritional deficiency.  Given severity, unusual causes considered as well.  Additional investigation as below. -- Urine potassium, chloride, calcium, urine pH  Best Practice (right click and "Reselect all SmartList Selections" daily)   Diet/type: tubefeeds DVT prophylaxis: prophylactic heparin  GI prophylaxis: PPI Lines: Central line Foley:  Yes, and it is still needed Code Status:  full code Last date of multidisciplinary goals of care discussion [updated patient's family at bedside.]  Labs   CBC: Recent Labs  Lab 02/13/22 1620 02/14/22 0841 02/14/22 1642 02/15/22 0433 02/15/22 1707  WBC 30.0* 18.7* 19.2* 18.2* 15.7*   NEUTROABS 26.1*  --   --   --   --   HGB 13.2 11.0* 10.9* 10.1* 9.3*  HCT 41.2 33.7* 31.8* 29.4* 27.1*  MCV 95.6 93.1 89.3 88.3 88.9  PLT 258 195 219 234 200     Basic Metabolic Panel: Recent Labs  Lab 02/14/22 0841 02/14/22 1642 02/14/22 2258 02/15/22 0433 02/15/22 1236 02/15/22 1646 02/15/22 2029 02/16/22 0448  NA 135 136 136  --  137  --  138  --   K 3.0* 2.2* 2.3*  --  2.1*  --  2.2*  --   CL 111 109 108  --  105  --  107  --   CO2 8* 9* 12*  --  15*  --  16*  --   GLUCOSE 105* 137* 132*  --  135*  --  130*  --   BUN 27* 32* 36*  --  40*  --  43*  --   CREATININE 2.46* 2.78* 2.88*  --  2.91*  --  3.01*  --   CALCIUM 6.0* 5.8* 6.4*  --  6.1*  --  6.5*  --   MG 1.9  --  1.9 2.1  --  2.1  --  2.1  PHOS 4.7*  --   --   --  3.6 3.4  --  2.5    GFR: Estimated Creatinine Clearance: 43.1 mL/min (A) (by C-G formula based on SCr of 3.01 mg/dL (H)). Recent Labs  Lab 02/13/22 1924 02/13/22 2131 02/14/22 0841 02/14/22 1642 02/15/22 0433 02/15/22 1707  PROCALCITON  --   --  1.47  --   --   --   WBC  --   --  18.7* 19.2* 18.2* 15.7*  LATICACIDVEN 3.2* 1.4  --  1.1  --   --      Liver Function Tests: Recent Labs  Lab 02/14/22 0841 02/14/22 1642  AST 46* 42*  ALT 13 14  ALKPHOS 131* 142*  BILITOT 0.7 0.4  PROT 5.0* 5.2*  ALBUMIN 2.0* 2.0*    No results for input(s): "LIPASE", "AMYLASE" in the last 168 hours. Recent Labs  Lab 02/14/22 0841 02/15/22 0419  AMMONIA 97* 50*     ABG    Component Value Date/Time   PHART 7.24 (L) 02/15/2022 1725   PCO2ART 38 02/15/2022 1725   PO2ART 95 02/15/2022 1725   HCO3 16.3 (L) 02/15/2022 1725   TCO2 23 03/30/2017 1103   ACIDBASEDEF 10.4 (H) 02/15/2022 1725   O2SAT 98 02/15/2022 1725     Coagulation Profile: No results for input(s): "INR", "PROTIME" in the last 168 hours.  Cardiac Enzymes: No results for input(s): "CKTOTAL", "CKMB", "CKMBINDEX", "TROPONINI" in the last 168 hours.  HbA1C: No results found for:  "HGBA1C"  CBG: Recent Labs  Lab 02/15/22 1139 02/15/22 1643 02/15/22 1941 02/15/22 2316 02/16/22 0328  GLUCAP 118* 106* 117* 125* 125*     Review of Systems:   Critically ill, unable to obtain.  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   Surgical History:   Past Surgical History:  Procedure Laterality Date   addenoidectomy     CESAREAN SECTION N/A 02/22/2013   Procedure: CESAREAN SECTION/TWINS;  Surgeon: Florian Buff, MD;  Location: [facility];  Service: Obstetrics;  Laterality: N/A;   CHOLECYSTECTOMY     GALLBLADDER SURGERY  2019   NASAL SINUS SURGERY     NEPHRECTOMY     right side   OTHER SURGICAL HISTORY     ulner nerve removal   TONSILLECTOMY       Social History:   reports that she has been smoking cigarettes. She has a 0.25 pack-year smoking history. She has never used smokeless tobacco. She reports that she does not drink alcohol and does not use drugs.   Family History:  Her family history includes Alcohol abuse in her brother and father; Diabetes in her maternal grandmother; Heart disease in her father and mother; Hyperlipidemia in her maternal grandmother and mother; Hypertension in her mother.   Allergies Allergies  Allergen Reactions   Cinnamon Anaphylaxis    Cinnamon flavoring    Other Swelling    Artificial cinnamon causes tongue to swell   Reglan [Metoclopramide] Other (See Comments)    Tactile disturbances REACTION: causes skin to "crawl"   Cinnamon Flavor Swelling    Artificial cinnamon causes tongue to swell   Codeine Nausea Only     Straight codeine only Percocet & vicodin are tolerated by pt.   Flomax [Tamsulosin] Other (  See Comments)    Other reaction(s): Other (See Comments) Oral sores Oral sores   Ibuprofen Other (See Comments)    Stomach pain Causes severe pain in stomach Pt can tolerate if used sparingly   Levofloxacin Hives   Tylenol [Acetaminophen] Other (See Comments)    Pt reports Tylenol causes her kidney to bleed.     Home Medications  Prior to Admission medications   Medication Sig Start Date End Date Taking? Authorizing Provider  cholecalciferol (VITAMIN D3) 25 MCG (1000 UNIT) tablet Take by mouth. 12/20/21  Yes [provider]  clotrimazole-betamethasone (LOTRISONE) cream Apply topically 2 (two) times daily.   Yes [provider]  FLUoxetine (PROZAC) 40 MG capsule Take 1 capsule (40 mg total) by mouth daily. 11/02/18  Yes Rakes, Connye Burkitt, FNP  gabapentin (NEURONTIN) 300 MG capsule Take 600 mg by mouth 2 (two) times daily. 08/08/20  Yes [provider]  methocarbamol (ROBAXIN) 500 MG tablet Take by mouth. 01/29/22  Yes [provider]  montelukast (SINGULAIR) 10 MG tablet Take by mouth. 07/30/20  Yes [provider]  nystatin (MYCOSTATIN/NYSTOP) powder Apply 1 Application topically 2 (two) times daily. 01/29/22  Yes [provider]  OLANZapine (ZYPREXA) 10 MG tablet Take 1 tablet (10 mg total) by mouth at bedtime. 01/24/19  Yes Rakes, Connye Burkitt, FNP  omeprazole (PRILOSEC) 40 MG capsule Take 1 capsule (40 mg total) by mouth daily. 07/05/19  Yes Hendricks Limes F, FNP  phentermine (ADIPEX-P) 37.5 MG tablet Take 37.5 mg by mouth every morning. 09/08/21  Yes [provider]  pravastatin (PRAVACHOL) 80 MG tablet Take by mouth. 08/06/19  Yes [provider]  Sodium Hypochlorite 0.0125 % SOLN Apply topically. 01/29/22  Yes [provider]  SUBOXONE 8-2 MG FILM Place 1 Film under the tongue 2 (two) times daily as needed (pain). 11/16/21  Yes  [provider]  SUMAtriptan (IMITREX) 25 MG tablet Take 25 mg by mouth as needed.   Yes [provider]  traZODone (DESYREL) 50 MG tablet Take 2 tablets (100 mg total) by mouth at bedtime as needed. 03/01/19 02/13/22 Yes Rakes, Connye Burkitt, FNP  Diethylpropion HCl 25 MG TABS Take 1 tablet by mouth 3 (three) times daily. Patient not taking: Reported on 02/13/2022 11/02/21   [provider]  doxycycline (MONODOX) 100 MG capsule Take 100 mg by mouth every 12 (twelve) hours. Patient not taking: Reported on 02/13/2022 12/20/21   [provider]  gabapentin (NEURONTIN) 400 MG capsule Take by mouth. Patient not taking: Reported on 02/13/2022 11/12/21   [provider]  naloxone [facility]) nasal spray 4 mg/0.1 mL SMARTSIG:1 Spray(s) Both Nares PRN Patient not taking: Reported on 02/13/2022 10/12/21   [provider]  predniSONE (DELTASONE) 5 MG tablet Take by mouth. Patient not taking: Reported on 02/13/2022 09/16/21   [provider]     Critical care time:      CRITICAL CARE Performed by: Lanier Clam   Total critical care time: 38 minutes  Critical care time was exclusive of separately billable procedures and treating other patients.  Critical care was necessary to treat or prevent imminent or life-threatening deterioration.  Critical care was time spent personally by me on the following activities: development of treatment plan with patient and/or surrogate as well as nursing, discussions with consultants, evaluation of patient's response to treatment, examination of patient, obtaining history from patient or surrogate, ordering and performing treatments and interventions, ordering and review of laboratory studies, ordering and review  of radiographic studies, pulse oximetry and re-evaluation of patient's condition.   Lanier Clam, MD [facility] 02/16/22 8:30 AM  See Amion for contact info

## 2022-02-17 ENCOUNTER — Other Ambulatory Visit: Payer: Self-pay

## 2022-02-17 DIAGNOSIS — J189 Pneumonia, unspecified organism: Secondary | ICD-10-CM | POA: Diagnosis not present

## 2022-02-17 LAB — GLUCOSE, CAPILLARY
Glucose-Capillary: 107 mg/dL — ABNORMAL HIGH (ref 70–99)
Glucose-Capillary: 117 mg/dL — ABNORMAL HIGH (ref 70–99)
Glucose-Capillary: 121 mg/dL — ABNORMAL HIGH (ref 70–99)
Glucose-Capillary: 122 mg/dL — ABNORMAL HIGH (ref 70–99)
Glucose-Capillary: 143 mg/dL — ABNORMAL HIGH (ref 70–99)
Glucose-Capillary: 146 mg/dL — ABNORMAL HIGH (ref 70–99)
Glucose-Capillary: 151 mg/dL — ABNORMAL HIGH (ref 70–99)

## 2022-02-17 LAB — CBC
HCT: 29.2 % — ABNORMAL LOW (ref 36.0–46.0)
Hemoglobin: 9.6 g/dL — ABNORMAL LOW (ref 12.0–15.0)
MCH: 30.3 pg (ref 26.0–34.0)
MCHC: 32.9 g/dL (ref 30.0–36.0)
MCV: 92.1 fL (ref 80.0–100.0)
Platelets: 205 10*3/uL (ref 150–400)
RBC: 3.17 MIL/uL — ABNORMAL LOW (ref 3.87–5.11)
RDW: 16.7 % — ABNORMAL HIGH (ref 11.5–15.5)
WBC: 14.3 10*3/uL — ABNORMAL HIGH (ref 4.0–10.5)
nRBC: 0 % (ref 0.0–0.2)

## 2022-02-17 LAB — MAGNESIUM: Magnesium: 2.1 mg/dL (ref 1.7–2.4)

## 2022-02-17 LAB — BASIC METABOLIC PANEL
Anion gap: 11 (ref 5–15)
BUN: 44 mg/dL — ABNORMAL HIGH (ref 6–20)
CO2: 15 mmol/L — ABNORMAL LOW (ref 22–32)
Calcium: 6.7 mg/dL — ABNORMAL LOW (ref 8.9–10.3)
Chloride: 113 mmol/L — ABNORMAL HIGH (ref 98–111)
Creatinine, Ser: 2.81 mg/dL — ABNORMAL HIGH (ref 0.44–1.00)
GFR, Estimated: 22 mL/min — ABNORMAL LOW (ref >60)
Glucose, Bld: 120 mg/dL — ABNORMAL HIGH (ref 70–99)
Potassium: 2.5 mmol/L — CL (ref 3.5–5.1)
Sodium: 139 mmol/L (ref 135–145)

## 2022-02-17 LAB — CALCIUM, URINE, RANDOM: Calcium, Ur: <0.8 mg/dL

## 2022-02-17 MED ORDER — INFLUENZA VAC SPLIT QUAD 0.5 ML IM SUSY
0.5000 mL | PREFILLED_SYRINGE | INTRAMUSCULAR | Status: DC
  Filled 2022-02-17: qty 0.5

## 2022-02-17 MED ORDER — VECURONIUM BROMIDE 10 MG IV SOLR
10.0000 mg | Freq: Once | INTRAVENOUS | Status: AC
  Administered 2022-02-17: 10 mg via INTRAVENOUS
  Filled 2022-02-17: qty 10

## 2022-02-17 MED ORDER — POTASSIUM CHLORIDE 10 MEQ/50ML IV SOLN
10.0000 meq | INTRAVENOUS | Status: AC
  Administered 2022-02-17 (×6): 10 meq via INTRAVENOUS
  Filled 2022-02-17 (×6): qty 50

## 2022-02-17 MED ORDER — SODIUM CHLORIDE 0.9 % IV SOLN: INTRAVENOUS | Status: DC | PRN

## 2022-02-17 MED ORDER — PNEUMOCOCCAL 20-VAL CONJ VACC 0.5 ML IM SUSY
0.5000 mL | PREFILLED_SYRINGE | INTRAMUSCULAR | Status: DC
  Filled 2022-02-17: qty 0.5

## 2022-02-17 MED ORDER — MIDAZOLAM HCL 2 MG/2ML IJ SOLN
2.0000 mg | INTRAMUSCULAR | Status: DC | PRN
  Administered 2022-02-17 – 2022-02-22 (×8): 2 mg via INTRAVENOUS
  Filled 2022-02-17 (×8): qty 2

## 2022-02-17 MED ORDER — FUROSEMIDE 10 MG/ML IJ SOLN
80.0000 mg | Freq: Once | INTRAMUSCULAR | Status: AC
  Administered 2022-02-17: 80 mg via INTRAVENOUS
  Filled 2022-02-17: qty 8

## 2022-02-17 MED ORDER — POTASSIUM CHLORIDE 20 MEQ PO PACK
40.0000 meq | PACK | Freq: Once | ORAL | Status: AC
  Administered 2022-02-17: 40 meq
  Filled 2022-02-17: qty 2

## 2022-02-17 MED ORDER — PIVOT 1.5 CAL PO LIQD
1000.0000 mL | ORAL | Status: DC
  Administered 2022-02-17 – 2022-02-18 (×2): 1000 mL
  Filled 2022-02-17 (×2): qty 1000

## 2022-02-17 NOTE — Progress Notes (Signed)
Notified e-link of critical potassium value: 2.5

## 2022-02-17 NOTE — Progress Notes (Signed)
eLink Physician-Brief Progress Note Patient Name: MARIELYS TRINIDAD DOB: 1985/08/28 MRN: [id number]   Date of Service  02/17/2022  HPI/Events of Note  Hypokalemia - K+ = 2.5 and Creatinine = 2.81.   eICU Interventions  Plan: Will replace K+.     Intervention Category Major Interventions: Electrolyte abnormality - evaluation and management  Cosimo Schertzer [place] 02/17/2022, 6:51 AM

## 2022-02-17 NOTE — Progress Notes (Signed)
NAME:  Alice [name], MRN:  [id number], DOB:  Nov 02, 1985, LOS: 4 ADMISSION DATE:  02/13/2022, CONSULTATION DATE: 02/14/2022 REFERRING MD: Dr. Carles Collet, CHIEF COMPLAINT: Respiratory failure  History of Present Illness:  This is a 37 year old female, morbidly obese, BMI 54, history of fatty liver, gastroesophageal reflux history of kidney stones, chronic left flank pain.  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 Amalia Hailey at [facility].  Patient presented to the emergency department with nausea vomiting diarrhea and a home flu test that was positive.  She was recently admitted to [facility] on 1210 through 1216 for a nongap metabolic acidosis and ulcerative lesions on the inner thighs suspected to fungal intertrigo.  Dermatology that saw her there did not feel the patient had pyoderma gangrenosum.  She was treated with fluconazole and discharged with 7 more days of fluconazole.  Recommended wound care with Aquacel Ag.  She was admitted previously at [facility] on 12/11/2021 through 12/30/2021 with cellulitis and Candida intertrigo and pneumonia with sepsis treated with IV antibiotics discharged on a 10-day door course of doxycycline.  She was seen in the emergency department her core temperature dropped to 94 she remained tachycardic and tachypneic.  Sodium was 133 BUN of 20, creatinine of 2.25 most recent in our system was within normal range.  Pregnancy test negative.  Lactic acidosis of 3.2.  She is found to be influenza be positive, influenza A COVID and RSV negative.  Patient's arterial blood gas showed a pH of 7.05, pCO2 of 30, PaO2 of 81, bicarb of 8.  She has a potassium of 3.0, leukocytosis of 18,000.  The patient was intubated and placed on mechanical life support prior to transfer.  Chest x-ray with bilateral interstitial infiltrates.  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 Amalia Hailey at [place] obesity [facility])    Preterm labor    Round ligament pain 09/10/2012   Torn ACL      Significant Hospital Events: Including procedures, antibiotic start and stop dates in addition to other pertinent events   1/1 admitted with severe ARDS, influenza pneumonia 1/2 supinated, O2 better, ventilation better, renal function stable but with AKI  Interim History / Subjective:  Critically ill, intubated on mechanical life support.  Trial of weaning sedation.  Good urine output with Lasix.  Creatinine slowly trending down.  Objective   Blood pressure (!) 130/56, pulse (!) 114, temperature 99.5 F (37.5 C), resp. rate (!) 30, height 5\' 9"  (1.753 m), weight (!) 167.4 kg, SpO2 94 %.    Vent Mode: PRVC FiO2 (%):  [30 %-40 %] 40 % Set Rate:  [30 bmp] 30 bmp Vt Set:  [460 mL] 460 mL PEEP:  [8 cmH20] 8 cmH20 Plateau Pressure:  [20 cmH20-32 cmH20] 27 cmH20   Intake/Output Summary (Last 24 hours) at 02/17/2022 0827 Last data filed at 02/17/2022 0801 Gross per 24 hour  Intake 3897.27 ml  Output 5535 ml  Net -1637.73 ml    Filed Weights   02/13/22  1148  Weight: (!) 167.4 kg    Examination: General: Obese FM, critically ill, intubated on life support  HENT: Endotracheal tube in place Lungs: Bilateral mechanically ventilated breath sounds Cardiovascular: Regular rhythm, S1-S2, tachycardic Abdomen: Obese, soft nontender nondistended, rash and wound within the pannicular fold Extremities: No significant edema, left hand purple mottled likely IV infiltration Neuro: Sedated on mechanical support GU: Deferred  Resolved Hospital Problem list     Assessment & Plan:   Acute  hypoxemic respiratory failure requiring intubation mechanical ventilation secondary to influenza B pneumonia Possible concomitant bacterial pneumonia Severe hypoxemia and acute respiratory distress syndrome Sepsis secondary to above, septic shock Plan: Adult mechanical vent protocol ARDS strategy, low tidal volume ventilation, currently limited to 7 cc per KG given acidemia, trial pressure support once able to spontaneously breathe, weaning sedation Continue Tamiflu x 5 days Broad-spectrum antimicrobial coverage with cefepime, linezolid 7 days planned Norepinephrine infusion to maintain mean arterial pressure greater than 65 mmHg Proned p.m. 1/1 with marked improvement in P: F ratio, supinated 1/2, sustained improvement in blood gas for the pronation Continue diuresis  Acute Renal Failure  Secondary to Sepsis  Metabolic acidosis due to renal failure P: Foley in place Monitor UOP , robust output to Lasix  is encouraging Continue diuresis, creatinine slowly trending down  Chronic Panniculitis  Open wound on abdomen  Chronic fungal intertrigo  P: WOC consult  Dressing changes PRN   Hyperammonemia  ?fatty liver disease  P: Lactulose 1/1 RUQ [place] with hepatic steatosis Limited evaluation due to habitus  Hypokalemia, hypocalcemia: Persistent.  Despite significant replacement.  Suspect total body stores exceedingly low, likely nutritional deficiency-abated by chronic diarrhea.  Given severity, unusual causes considered as well.  Additional investigation as below. -- Urine studies sent.  Best Practice (right click and "Reselect all SmartList Selections" daily)   Diet/type: tubefeeds DVT prophylaxis: prophylactic heparin  GI prophylaxis: PPI Lines: Central line Foley:  Yes, and it is still needed Code Status:  full code Last date of multidisciplinary goals of care discussion [updated patient's family at bedside.]  Labs   CBC: Recent Labs  Lab 02/13/22 1620 02/14/22 0841  02/14/22 1642 02/15/22 0433 02/15/22 1707 02/16/22 0825 02/17/22 0555  WBC 30.0*   < > 19.2* 18.2* 15.7* 13.0* 14.3*  NEUTROABS 26.1*  --   --   --   --   --   --   HGB 13.2   < > 10.9* 10.1* 9.3* 9.5* 9.6*  HCT 41.2   < > 31.8* 29.4* 27.1* 29.0* 29.2*  MCV 95.6   < > 89.3 88.3 88.9 92.4 92.1  PLT 258   < > 219 234 200 192 205   < > = values in this interval not displayed.     Basic Metabolic Panel: Recent Labs  Lab 02/14/22 0841 02/14/22 1642 02/15/22 0433 02/15/22 1236 02/15/22 1646 02/15/22 2029 02/16/22 0448 02/16/22 0825 02/16/22 1524 02/17/22 0518 02/17/22 0555  NA 135   < >  --  137  --  138  --  139 139  --  139  K 3.0*   < >  --  2.1*  --  2.2*  --  2.9* 3.2*  --  2.5*  CL 111   < >  --  105  --  107  --  111 112*  --  113*  CO2 8*   < >  --  15*  --  16*  --  15* 14*  --  15*  GLUCOSE 105*   < >  --  135*  --  130*  --  108* 102*  --  120*  BUN 27*   < >  --  40*  --  43*  --  41* 39*  --  44*  CREATININE 2.46*   < >  --  2.91*  --  3.01*  --  3.07* 2.94*  --  2.81*  CALCIUM 6.0*   < >  --  6.1*  --  6.5*  --  6.5* 6.7*  --  6.7*  MG 1.9   < > 2.1  --  2.1  --  2.1  --  2.1 2.1  --   PHOS 4.7*  --   --  3.6 3.4  --  2.5  --  1.9*  --   --    < > = values in this interval not displayed.    GFR: Estimated Creatinine Clearance: 46.2 mL/min (A) (by C-G formula based on SCr of 2.81 mg/dL (H)). Recent Labs  Lab 02/13/22 1924 02/13/22 2131 02/14/22 0841 02/14/22 1642 02/15/22 0433 02/15/22 1707 02/16/22 0825 02/17/22 0555  PROCALCITON  --   --  1.47  --   --   --   --   --   WBC  --   --  18.7* 19.2* 18.2* 15.7* 13.0* 14.3*  LATICACIDVEN 3.2* 1.4  --  1.1  --   --   --   --      Liver Function Tests: Recent Labs  Lab 02/14/22 0841 02/14/22 1642  AST 46* 42*  ALT 13 14  ALKPHOS 131* 142*  BILITOT 0.7 0.4  PROT 5.0* 5.2*  ALBUMIN 2.0* 2.0*    No results for input(s): "LIPASE", "AMYLASE" in the last 168 hours. Recent Labs  Lab 02/14/22 0841  02/15/22 0419  AMMONIA 97* 50*     ABG    Component Value Date/Time   PHART 7.24 (L) 02/15/2022 1725   PCO2ART 38 02/15/2022 1725   PO2ART 95 02/15/2022 1725   HCO3 16.3 (L) 02/15/2022 1725   TCO2 23 03/30/2017 1103   ACIDBASEDEF 10.4 (H) 02/15/2022 1725   O2SAT 98 02/15/2022 1725     Coagulation Profile: No results for input(s): "INR", "PROTIME" in the last 168 hours.  Cardiac Enzymes: No results for input(s): "CKTOTAL", "CKMB", "CKMBINDEX", "TROPONINI" in the last 168 hours.  HbA1C: No results found for: "HGBA1C"  CBG: Recent Labs  Lab 02/16/22 1321 02/16/22 2042 02/17/22 0015 02/17/22 0402 02/17/22 0737  GLUCAP 107* 134* 146* 121* 117*     Review of Systems:   Critically ill, unable to obtain.  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   Surgical History:   Past Surgical History:  Procedure Laterality Date   addenoidectomy     CESAREAN SECTION N/A 02/22/2013   Procedure: CESAREAN SECTION/TWINS;  Surgeon: [name], MD;  Location: [facility];  Service: Obstetrics;  Laterality: N/A;   CHOLECYSTECTOMY     GALLBLADDER SURGERY  2019   NASAL SINUS SURGERY     NEPHRECTOMY     right side   OTHER SURGICAL HISTORY     ulner  nerve removal   TONSILLECTOMY       Social History:   reports that she has been smoking cigarettes. She has a 0.25 pack-year smoking history. She has never used smokeless tobacco. She reports that she does not drink alcohol and does not use drugs.   Family History:  Her family history includes Alcohol abuse in her brother and  father; Diabetes in her maternal grandmother; Heart disease in her father and mother; Hyperlipidemia in her maternal grandmother and mother; Hypertension in her mother.   Allergies Allergies  Allergen Reactions   Cinnamon Anaphylaxis    Cinnamon flavoring    Other Swelling    Artificial cinnamon causes tongue to swell   Reglan [Metoclopramide] Other (See Comments)    Tactile disturbances REACTION: causes skin to "crawl"   Cinnamon Flavor Swelling    Artificial cinnamon causes tongue to swell   Codeine Nausea Only    Straight codeine only Percocet & vicodin are tolerated by pt.   Flomax [Tamsulosin] Other (See Comments)    Other reaction(s): Other (See Comments) Oral sores Oral sores   Ibuprofen Other (See Comments)    Stomach pain Causes severe pain in stomach Pt can tolerate if used sparingly   Levofloxacin Hives   Tylenol [Acetaminophen] Other (See Comments)    Pt reports Tylenol causes her kidney to bleed.     Home Medications  Prior to Admission medications   Medication Sig Start Date End Date Taking? Authorizing Provider  cholecalciferol (VITAMIN D3) 25 MCG (1000 UNIT) tablet Take by mouth. 12/20/21  Yes [provider]  clotrimazole-betamethasone (LOTRISONE) cream Apply topically 2 (two) times daily.   Yes [provider]  FLUoxetine (PROZAC) 40 MG capsule Take 1 capsule (40 mg total) by mouth daily. 11/02/18  Yes Rakes, Doralee Albino, FNP  gabapentin (NEURONTIN) 300 MG capsule Take 600 mg by mouth 2 (two) times daily. 08/08/20  Yes [provider]  methocarbamol (ROBAXIN) 500 MG tablet Take by mouth. 01/29/22  Yes [provider]  montelukast (SINGULAIR) 10 MG tablet Take by mouth. 07/30/20  Yes [provider]  nystatin (MYCOSTATIN/NYSTOP) powder Apply 1 Application topically 2 (two) times daily. 01/29/22  Yes [provider]  OLANZapine (ZYPREXA) 10 MG tablet Take 1 tablet (10 mg total) by mouth at bedtime. 01/24/19  Yes  Rakes, Doralee Albino, FNP  omeprazole (PRILOSEC) 40 MG capsule Take 1 capsule (40 mg total) by mouth daily. 07/05/19  Yes Deliah Boston F, FNP  phentermine (ADIPEX-P) 37.5 MG tablet Take 37.5 mg by mouth every morning. 09/08/21  Yes [provider]  pravastatin (PRAVACHOL) 80 MG tablet Take by mouth. 08/06/19  Yes [provider]  Sodium Hypochlorite 0.0125 % SOLN Apply topically. 01/29/22  Yes [provider]  SUBOXONE 8-2 MG FILM Place 1 Film under the tongue 2 (two) times daily as needed (pain). 11/16/21  Yes [provider]  SUMAtriptan (IMITREX) 25 MG tablet Take 25 mg by mouth as needed.   Yes [provider]  traZODone (DESYREL) 50 MG tablet Take 2 tablets (100 mg total) by mouth at bedtime as needed. 03/01/19 02/13/22 Yes Rakes, Doralee Albino, FNP  Diethylpropion HCl 25 MG TABS Take 1 tablet by mouth 3 (three) times daily. Patient not taking: Reported on 02/13/2022 11/02/21   [provider]  doxycycline (MONODOX) 100 MG capsule Take 100 mg by mouth every 12 (twelve) hours. Patient not taking: Reported on 02/13/2022 12/20/21   [provider]  gabapentin (NEURONTIN) 400 MG capsule Take  by mouth. Patient not taking: Reported on 02/13/2022 11/12/21   [provider]  naloxone [facility] nasal spray 4 mg/0.1 mL SMARTSIG:1 Spray(s) Both Nares PRN Patient not taking: Reported on 02/13/2022 10/12/21   [provider]  predniSONE (DELTASONE) 5 MG tablet Take by mouth. Patient not taking: Reported on 02/13/2022 09/16/21   [provider]     Critical care time:      CRITICAL CARE Performed by: Lanier Clam   Total critical care time: 35 minutes  Critical care time was exclusive of separately billable procedures and treating other patients.  Critical care was necessary to treat or prevent imminent or life-threatening deterioration.  Critical care was time spent personally by me on the following activities:  development of treatment plan with patient and/or surrogate as well as nursing, discussions with consultants, evaluation of patient's response to treatment, examination of patient, obtaining history from patient or surrogate, ordering and performing treatments and interventions, ordering and review of laboratory studies, ordering and review of radiographic studies, pulse oximetry and re-evaluation of patient's condition.   Lanier Clam, MD [facility] 02/17/22 8:27 AM  See Amion for contact info

## 2022-02-17 NOTE — Progress Notes (Signed)
eLink Physician-Brief Progress Note Patient Name: Alice [name] DOB: [birth year] MRN: [id number]   Date of Service  02/17/2022  HPI/Events of Note  Ventilator asynchrony - Current ventilator settings 30%/PRVC 30/TV 460/P 8. Sat = 95% Sedated with Propofol and Fentanyl IV infusions.   eICU Interventions  Plan: Nurse to obtain BIS reading.  Vercuronium 10 mg IV X 1 if BIS demonstrated adequate sedation.      Intervention Category Major Interventions: Other:;Respiratory failure - evaluation and management  Analaura Messler Eugene 02/17/2022, 6:06 AM

## 2022-02-17 NOTE — Progress Notes (Signed)
Brief Nutrition Support Update  Patient has remained on TF of Pivot 1.5 at 65mL/hr, tolerating well.   Per discussion with CCM MD, can advance past trickles towards goal today.  Potassium continues to be low. Will order to increase by only 34mL Q12H, plan discussed with RN. Will need close monitoring of electrolytes during advancements.    Tube feeding via OG: Pivot 1.5 at 50 ml/h (1200 ml per day) *Starting at 80mL/hr, advance by 22mL Q12H towards goal Prosource TF20 60 ml daily Provides 1880 kcal, 132 gm protein, 900 ml free water daily   - Monitor magnesium, potassium, and phosphorus BID, MD to replete as needed. - 100mg  thiamine x5 days to aid in macronutrient metabolism.    Samson Frederic RD, LDN For contact information, refer to [facility].

## 2022-02-17 NOTE — Plan of Care (Signed)

## 2022-02-17 NOTE — Progress Notes (Addendum)
Called to pt bedside to assess radial aline.  This writer was unable to flush or draw blood back from line.  Site found to be very red and inflammed in appearance.  Line discontinued, catheter removed and intact and saved in sterile specimen cup pending an rx for possible culture.  [facility] RN, Lysbeth Galas made aware.  Catheter/specimen cup given to RN.  Rx for abg held / not done per [facility] RN, Lysbeth Galas.

## 2022-02-18 DIAGNOSIS — J189 Pneumonia, unspecified organism: Secondary | ICD-10-CM | POA: Diagnosis not present

## 2022-02-18 LAB — GLUCOSE, CAPILLARY
Glucose-Capillary: 119 mg/dL — ABNORMAL HIGH (ref 70–99)
Glucose-Capillary: 125 mg/dL — ABNORMAL HIGH (ref 70–99)
Glucose-Capillary: 126 mg/dL — ABNORMAL HIGH (ref 70–99)
Glucose-Capillary: 127 mg/dL — ABNORMAL HIGH (ref 70–99)
Glucose-Capillary: 151 mg/dL — ABNORMAL HIGH (ref 70–99)
Glucose-Capillary: 152 mg/dL — ABNORMAL HIGH (ref 70–99)

## 2022-02-18 LAB — BASIC METABOLIC PANEL
Anion gap: 10 (ref 5–15)
Anion gap: 8 (ref 5–15)
BUN: 56 mg/dL — ABNORMAL HIGH (ref 6–20)
BUN: 52 mg/dL — ABNORMAL HIGH (ref 6–20)
CO2: 17 mmol/L — ABNORMAL LOW (ref 22–32)
CO2: 16 mmol/L — ABNORMAL LOW (ref 22–32)
Calcium: 6.8 mg/dL — ABNORMAL LOW (ref 8.9–10.3)
Calcium: 7 mg/dL — ABNORMAL LOW (ref 8.9–10.3)
Chloride: 117 mmol/L — ABNORMAL HIGH (ref 98–111)
Chloride: 122 mmol/L — ABNORMAL HIGH (ref 98–111)
Creatinine, Ser: 2.33 mg/dL — ABNORMAL HIGH (ref 0.44–1.00)
Creatinine, Ser: 2.13 mg/dL — ABNORMAL HIGH (ref 0.44–1.00)
GFR, Estimated: 27 mL/min — ABNORMAL LOW (ref >60)
GFR, Estimated: 30 mL/min — ABNORMAL LOW (ref >60)
Glucose, Bld: 122 mg/dL — ABNORMAL HIGH (ref 70–99)
Glucose, Bld: 153 mg/dL — ABNORMAL HIGH (ref 70–99)
Potassium: <2 mmol/L — CL (ref 3.5–5.1)
Potassium: 3.4 mmol/L — ABNORMAL LOW (ref 3.5–5.1)
Sodium: 144 mmol/L (ref 135–145)
Sodium: 146 mmol/L — ABNORMAL HIGH (ref 135–145)

## 2022-02-18 LAB — CULTURE, BLOOD (ROUTINE X 2)
Culture: NO GROWTH
Culture: NO GROWTH

## 2022-02-18 LAB — TRIGLYCERIDES: Triglycerides: 353 mg/dL — ABNORMAL HIGH (ref <150)

## 2022-02-18 LAB — MAGNESIUM: Magnesium: 2.1 mg/dL (ref 1.7–2.4)

## 2022-02-18 LAB — PHOSPHORUS: Phosphorus: 1.2 mg/dL — ABNORMAL LOW (ref 2.5–4.6)

## 2022-02-18 MED ORDER — POTASSIUM CHLORIDE 10 MEQ/100ML IV SOLN
10.0000 meq | INTRAVENOUS | Status: AC
  Administered 2022-02-18 (×4): 10 meq via INTRAVENOUS
  Filled 2022-02-18 (×4): qty 100

## 2022-02-18 MED ORDER — POTASSIUM CHLORIDE 20 MEQ PO PACK
40.0000 meq | PACK | ORAL | Status: AC
  Administered 2022-02-18 (×3): 40 meq
  Filled 2022-02-18 (×3): qty 2

## 2022-02-18 MED ORDER — POTASSIUM PHOSPHATES 15 MMOLE/5ML IV SOLN
30.0000 mmol | Freq: Once | INTRAVENOUS | Status: AC
  Administered 2022-02-18: 30 mmol via INTRAVENOUS
  Filled 2022-02-18: qty 10

## 2022-02-18 MED ORDER — POTASSIUM CHLORIDE 20 MEQ PO PACK: 40.0000 meq | PACK | Freq: Two times a day (BID) | ORAL | Status: DC

## 2022-02-18 MED ORDER — VITAL 1.5 CAL PO LIQD
1000.0000 mL | ORAL | Status: DC
  Administered 2022-02-18 – 2022-02-21 (×4): 1000 mL
  Filled 2022-02-18 (×5): qty 1000

## 2022-02-18 NOTE — Progress Notes (Addendum)
NAME:  Alice [name], MRN:  [id number], DOB:  [birth year], LOS: 5 ADMISSION DATE:  02/13/2022, CONSULTATION DATE: 02/14/2022 REFERRING MD: Dr. Carles Collet, CHIEF COMPLAINT: Respiratory failure  History of Present Illness:   This is a 37 year old female, morbidly obese, BMI 54, history of fatty liver, gastroesophageal reflux history of kidney stones, chronic left flank pain.  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 Amalia Hailey at [facility]  Patient presented to the emergency department with nausea vomiting diarrhea and a home flu test that was positive.  She was recently admitted to [facility] on 1210 through 1216 for a nongap metabolic acidosis and ulcerative lesions on the inner thighs suspected to fungal intertrigo.  Dermatology that saw her there did not feel the patient had pyoderma gangrenosum.  She was treated with fluconazole and discharged with 7 more days of fluconazole.  Recommended wound care with Aquacel Ag.  She was admitted previously at [facility] on 12/11/2021 through 12/30/2021 with cellulitis and Candida intertrigo and pneumonia with sepsis treated with IV antibiotics discharged on a 10-day door course of doxycycline.  She was seen in the emergency department her core temperature dropped to 94 she remained tachycardic and tachypneic.  Sodium was 133 BUN of 20, creatinine of 2.25 most recent in our system was within normal range.  Pregnancy test negative.  Lactic acidosis of 3.2.  She is found to be influenza be positive, influenza A COVID and RSV negative.  Patient's arterial blood gas showed a pH of 7.05, pCO2 of 30, PaO2 of 81, bicarb of 8.  She has a potassium of 3.0, leukocytosis of 18,000.  The patient was intubated and placed on mechanical life support prior to transfer.  Chest x-ray with bilateral interstitial infiltrates.  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 Amalia Hailey at [place] obesity [facility])    Preterm labor    Round ligament pain 09/10/2012   Torn ACL      Significant Hospital Events: Including procedures, antibiotic start and stop dates in addition to other pertinent events   1/1 admitted with severe ARDS, influenza pneumonia. Proned with marked improvement in P: F ratio 1/2 supinated, O2 better, ventilation better, renal function stable but with AKI  Interim History / Subjective:   Remains on the ventilator, potassium still low Off norepinephrine drip  Objective   Blood pressure 135/63, pulse (!) 111, temperature 99.3 F (37.4 C), temperature source Bladder, resp. rate 18, height 5\' 9"  (1.753 m), weight (!) 146.1 kg, SpO2 100 %.    Vent Mode: PSV;CPAP FiO2 (%):  [40 %] 40 % Set Rate:  [30 bmp] 30 bmp Vt Set:  [460 mL] 460 mL PEEP:  [8 cmH20] 8 cmH20 Pressure Support:  [8 cmH20-10 cmH20] 8 cmH20 Plateau Pressure:  [23 cmH20-25 cmH20] 23 cmH20   Intake/Output Summary (Last 24 hours) at 02/18/2022 0913 Last data filed at 02/18/2022 0852 Gross per 24 hour  Intake 2623.41 ml  Output 7195 ml  Net -4571.59 ml   Danley Danker  Weights   02/13/22 1148 02/18/22 0424  Weight: (!) 167.4 kg (!) 146.1 kg    Examination: Gen:      No acute distress, critically ill-appearing, obese HEENT:  EOMI, sclera anicteric Neck:     No masses; no thyromegaly ET tube Lungs:    Clear to auscultation bilaterally; normal respiratory effort CV:         Regular rate and rhythm; no murmurs Abd:      + bowel sounds; soft, non-tender; no palpable masses, no distension Ext:    No edema; adequate peripheral perfusion Skin:      Warm and dry; no rash Neuro:  Sedated  Labs/imaging reviewed Significant for potassium less than 2, BUN/creatinine 56/2.33 Glucose 122 WBC 14.3, hemoglobin 9.6, platelets 205 No new imaging  Resolved Hospital Problem list     Assessment & Plan:   Acute hypoxemic respiratory failure requiring intubation mechanical ventilation secondary to influenza B pneumonia Possible concomitant bacterial pneumonia Severe hypoxemia and acute respiratory distress syndrome Sepsis secondary to above, septic shock Continue pressure support weans as tolerated.  Will need to be more awake before extubation On Tamiflu Broad antibiotic coverage with cefepime, linezolid for 7 days Will hold diuresis for today as potassium is low  Acute Renal Failure  Secondary to Sepsis  Metabolic acidosis due to renal failure Foley in place Monitor UOP  Chronic Panniculitis  Open wound on abdomen  Chronic fungal intertrigo  WOC consult  Dressing changes PRN   Hyperammonemia  1/1 RUQ [place] with hepatic steatosis Limited evaluation due to habitus Recheck ammonia levels.  Hold lactulose as GI loss may be contributing to hypokalemia  Hypokalemia, hypocalcemia: Persistent.  Despite significant replacement.  Suspect total body stores exceedingly low, likely nutritional deficiency, refeeding syndrome-abated by chronic diarrhea.  Given severity, unusual causes considered as well.  Additional investigation as below. Urine studies sent.  Best Practice (right click and "Reselect all SmartList Selections" daily)   Diet/type: tubefeeds DVT prophylaxis: prophylactic heparin  GI prophylaxis: PPI Lines: Central line Foley:  Yes, and it is still needed Code Status:  full code Last date of multidisciplinary goals of care discussion [updated patient's family at bedside.]  Critical care time:    The patient is critically ill with multiple organ system failure and requires high complexity decision making for assessment and support, frequent evaluation and  titration of therapies, advanced monitoring, review of radiographic studies and interpretation of complex data.   Critical Care Time devoted to patient care services, exclusive of separately billable procedures, described in this note is 35 minutes.   Marshell Garfinkel MD [facility] See Amion for pager  If no response to pager , please call [phone] until 7pm After 7:00 pm call Elink  [phone] 02/18/2022, 9:13 AM

## 2022-02-18 NOTE — Progress Notes (Signed)
Nutrition Follow-up  DOCUMENTATION CODES:   Morbid obesity  INTERVENTION:  - Will adjust formula to Vital 1.5 as this formula contains more potassium than Pivot 1.5   - Per discussion with MD, plan to not advance TFs past 33mL/hr today.   - Once able to advance TF (pending stabilization of potassium), recommend below: Tube feeding via OG (tip in stomach per MD read of xray): Vital 1.5 at 60 ml/h (1440 ml per day) *Advance by 70mL Q12H to goal Prosource TF20 60 ml BID Provides 2320 kcal, 137 gm protein, 1100 ml free water daily  - Monitor magnesium, potassium, and phosphorus BID, MD to replete as needed. - Continue 100mg  thiamine x5 days.   - Monitor weight trends.    NUTRITION DIAGNOSIS:   Inadequate oral intake related to inability to eat as evidenced by NPO status (on vent). *ongoing  GOAL:   Patient will meet greater than or equal to 90% of their needs *progressing, on TF  MONITOR:   Vent status, Labs, TF tolerance  REASON FOR ASSESSMENT:   Consult Enteral/tube feeding initiation and management (trickle tube feed)  ASSESSMENT:   37 y.o. 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  12/31: Admit 1/1: Intubated 1/2 Started Pivot 1.5 at 26mL/hr 1/5 TF @ 70mL/hr, plan to maintain current rate and assess K+ trends  Patient remains intubated this AM, TF running at 15mL/hr.  Patient tolerating TF well however potassium continues to be consistently low. Would not suspect patient refeeding as TF has advanced very slowly however cannot rule out. Per CCM note urine studies sent out.  Per discussion with MD, will plan to maintain current rate of 48mL/hr through today and assess potassium trends.  Will change to Vital 1.5 which contains more potassium  than Pivot 1.5 Changes discussed with MD and RN.    Medications reviewed and include: Colace, Miralax, Thiamine, K+ replacement  Labs reviewed:   Latest Reference Range & Units 02/15/22 20:29 02/16/22 08:25 02/16/22 15:24 02/17/22 05:55 02/18/22 03:30  Potassium 3.5 - 5.1 mmol/L 2.2 (LL) 2.9 (L) 3.2 (L) 2.5 (LL) <2.0 (LL)  (LL): Data is critically low (L): Data is abnormally low Creatinine 2.33   Diet Order:   Diet Order             Diet NPO time specified  Diet effective now                   EDUCATION NEEDS:  Not appropriate for education at this time  Skin:  Skin Assessment: Reviewed RN Assessment Skin Integrity Issues:: Other (Comment) Other: (ITD) Intertriginous Dermatitis on Abdomen  Last BM:  1/4  Height:  Ht Readings from Last 1 Encounters:  02/17/22 5\' 9"  (1.753 m)   Weight:  Wt Readings from Last 1 Encounters:  02/18/22 (!) 146.1 kg   Ideal Body Weight:  65.91 kg  BMI:  Body mass index is 47.55 kg/m.  Estimated Nutritional Needs:  Kcal:  2100-2550 kcals Protein:  130-145 grams Fluid:  >/= 2.1L    Samson Frederic RD, LDN For contact information, refer to [facility].

## 2022-02-18 NOTE — Progress Notes (Signed)
eLink Physician-Brief Progress Note Patient Name: Alice [name] DOB: 1985-02-25 MRN: [id number]   Date of Service  02/18/2022  HPI/Events of Note  Hypokalemia - K+ < 2.0 and Creatinine = 2.33.   eICU Interventions  Plan: Klor-con 40 meq per tube Q 4 hours X 3 doses.  Repeat K+ at 5 PM.      Intervention Category Major Interventions: Electrolyte abnormality - evaluation and management  Danamarie Minami Eugene 02/18/2022, 5:27 AM

## 2022-02-18 NOTE — TOC Initial Note (Signed)
Transition of Care [facility]) - Initial/Assessment Note    Patient Details  Name: Alice [name] MRN: [id number] Date of Birth: 07/26/1985  Transition of Care [facility]) CM/SW Contact:    Roseanne Kaufman, RN Phone Number: 02/18/2022, 12:50 PM  Clinical Narrative:   Jackquline [place] consult for HH/SNF/DME: sister requesting proper DME for discharge plan. Patient is currently intubated. This RNCM spoke with patient's sister Alice [name] at phone number on file. Alice [name] requesting a shower chair, recliner to assist with standing and hospital bed. Alice [name] reports she has been a CNA for 27 years and will be the person taking care of her sister post discharge. This RNCM explained TOC department role and explained process during hospital stay.  Transportation at discharge: sister Alice [name] (if patient is medically stable to be transported via car)   TOC will continue to follow for needs.                  Barriers to Discharge: Continued Medical Work up   Patient Goals and CMS Choice Patient states their goals for this hospitalization and ongoing recovery are:: return home (patient currently on vent, spoke with sister Alice [name]) CMS Medicare.gov Compare Post Acute Care list provided to:: Patient Represenative (must comment) Alice [name] (sister)) Choice offered to / list presented to : Sibling      Expected Discharge Plan and Services In-house Referral: NA Discharge Planning Services: CM Consult Post Acute Care Choice: Durable Medical Equipment Living arrangements for the past 2 months: Apartment                                      Prior Living Arrangements/Services Living arrangements for the past 2 months: Apartment Lives with:: Significant Other, Relatives Patient language and need for interpreter reviewed:: Yes Do you feel safe going back to the place where you live?: Yes        Care giver support system in place?: Yes (comment) Current home services: DME (walker) Criminal Activity/Legal Involvement  Pertinent to Current Situation/Hospitalization: No - Comment as needed  Activities of Daily Living Home Assistive Devices/Equipment: Shower chair without back, Environmental consultant (specify type) (walker with seat; sister reports she can get better DME: LIFT CHAIR, Shower chair for home that fits) ADL Screening (condition at time of admission) Patient's cognitive ability adequate to safely complete daily activities?: Yes Is the patient deaf or have difficulty hearing?: No Does the patient have difficulty seeing, even when wearing glasses/contacts?: No Does the patient have difficulty concentrating, remembering, or making decisions?: No Patient able to express need for assistance with ADLs?: Yes Does the patient have difficulty dressing or bathing?: No Independently performs ADLs?: Yes (appropriate for developmental age) Does the patient have difficulty walking or climbing stairs?: Yes Weakness of Legs: Both Weakness of Arms/Hands: Both  Permission Sought/Granted Permission sought to share information with : Case Manager Permission granted to share information with : Yes, Verbal Permission Granted  Share Information with NAME: Case manager           Emotional Assessment Appearance:: Appears stated age Attitude/Demeanor/Rapport: Unable to Assess Affect (typically observed): Unable to Assess Orientation: :  (currently intubated/UTA) Alcohol / Substance Use: Not Applicable Psych Involvement: No (comment)  Admission diagnosis:  Metabolic acidosis [N82.95] Generalized abdominal pain [R10.84] Flu-like symptoms [R68.89] AKI (acute kidney injury) ([place]) [N17.9] Nausea vomiting and diarrhea [R11.2, R19.7] Open wound of anterior abdominal wall with complication, subsequent encounter [S31.109D] Multifocal pneumonia [  J18.9] Patient Active Problem List   Diagnosis Date Noted   Sepsis due to undetermined organism ([place]) 02/14/2022   Chronic abdominal wound infection 02/14/2022   Influenza B 02/14/2022    Acute respiratory failure with hypoxia (HCC) 02/14/2022   Hypothermia 02/14/2022   High anion gap metabolic acidosis 88/50/2774   Lactic acidosis 02/14/2022   Nausea & vomiting 02/14/2022   Diarrhea 02/14/2022   Hypokalemia 02/14/2022   Hyponatremia 02/14/2022   AKI (acute kidney injury) ([place]) 02/14/2022   Prolonged QT interval 02/14/2022   Hypoglycemia 12/87/8676   Metabolic acidosis 72/10/4707   Acute metabolic encephalopathy 62/83/6629   Multifocal pneumonia 02/13/2022   Migraine 12/12/2021   Panniculitis 12/12/2021   Bilateral pneumonia 12/12/2021   GAD (generalized anxiety disorder) 01/24/2019   GERD without esophagitis 07/13/2018   Intractable migraine without aura and without status migrainosus 07/13/2018   High risk medication use 07/13/2018   Calculus of gallbladder with acute and chronic cholecystitis with obstruction 06/19/2017   Ulnar neuropathy of left upper extremity 04/05/2017   Primary osteoarthritis of left knee 08/10/2015   Mood disorder ([place]) 12/30/2014   Smoker 05/13/2014   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   Single kidney 05/12/2014   Elevated liver function tests 09/24/2012   S/P left knee arthroscopy 07/23/2012   History of recurrent UTI (urinary tract infection) 07/23/2012   Obesity, Class III, BMI 40-49.9 (morbid obesity) ([place]) 07/23/2012   PCP:  Pcp, No Pharmacy:   [id number] [place], [place] [address] Phone: [phone] Fax: [phone]     Social Determinants of Health (SDOH) Social History: SDOH Screenings   Food Insecurity: No Food Insecurity (02/17/2022)  Housing: Low Risk  (02/17/2022)  Depression (PHQ2-9): Low Risk  (01/24/2019)  Recent Concern: Depression (PHQ2-9) - Medium Risk (12/09/2018)  Tobacco Use: High Risk (02/13/2022)   SDOH Interventions:     Readmission Risk Interventions     No data to display

## 2022-02-19 ENCOUNTER — Inpatient Hospital Stay (HOSPITAL_COMMUNITY): Payer: Medicaid Other

## 2022-02-19 DIAGNOSIS — J189 Pneumonia, unspecified organism: Secondary | ICD-10-CM | POA: Diagnosis not present

## 2022-02-19 LAB — COMPREHENSIVE METABOLIC PANEL
ALT: 14 U/L (ref 0–44)
AST: 19 U/L (ref 15–41)
Albumin: 1.7 g/dL — ABNORMAL LOW (ref 3.5–5.0)
Alkaline Phosphatase: 117 U/L (ref 38–126)
Anion gap: 7 (ref 5–15)
BUN: 47 mg/dL — ABNORMAL HIGH (ref 6–20)
CO2: 17 mmol/L — ABNORMAL LOW (ref 22–32)
Calcium: 7.6 mg/dL — ABNORMAL LOW (ref 8.9–10.3)
Chloride: 121 mmol/L — ABNORMAL HIGH (ref 98–111)
Creatinine, Ser: 1.68 mg/dL — ABNORMAL HIGH (ref 0.44–1.00)
GFR, Estimated: 40 mL/min — ABNORMAL LOW (ref >60)
Glucose, Bld: 120 mg/dL — ABNORMAL HIGH (ref 70–99)
Potassium: 2.7 mmol/L — CL (ref 3.5–5.1)
Sodium: 145 mmol/L (ref 135–145)
Total Bilirubin: 0.4 mg/dL (ref 0.3–1.2)
Total Protein: 5.1 g/dL — ABNORMAL LOW (ref 6.5–8.1)

## 2022-02-19 LAB — GLUCOSE, CAPILLARY
Glucose-Capillary: 117 mg/dL — ABNORMAL HIGH (ref 70–99)
Glucose-Capillary: 124 mg/dL — ABNORMAL HIGH (ref 70–99)
Glucose-Capillary: 157 mg/dL — ABNORMAL HIGH (ref 70–99)
Glucose-Capillary: 134 mg/dL — ABNORMAL HIGH (ref 70–99)
Glucose-Capillary: 137 mg/dL — ABNORMAL HIGH (ref 70–99)
Glucose-Capillary: 153 mg/dL — ABNORMAL HIGH (ref 70–99)

## 2022-02-19 LAB — CBC
HCT: 27.3 % — ABNORMAL LOW (ref 36.0–46.0)
Hemoglobin: 9.1 g/dL — ABNORMAL LOW (ref 12.0–15.0)
MCH: 30.2 pg (ref 26.0–34.0)
MCHC: 33.3 g/dL (ref 30.0–36.0)
MCV: 90.7 fL (ref 80.0–100.0)
Platelets: 197 10*3/uL (ref 150–400)
RBC: 3.01 MIL/uL — ABNORMAL LOW (ref 3.87–5.11)
RDW: 17.2 % — ABNORMAL HIGH (ref 11.5–15.5)
WBC: 19.7 10*3/uL — ABNORMAL HIGH (ref 4.0–10.5)
nRBC: 0.5 % — ABNORMAL HIGH (ref 0.0–0.2)

## 2022-02-19 LAB — AMMONIA: Ammonia: 42 umol/L — ABNORMAL HIGH (ref 9–35)

## 2022-02-19 LAB — PHOSPHORUS: Phosphorus: 2.1 mg/dL — ABNORMAL LOW (ref 2.5–4.6)

## 2022-02-19 LAB — CK: Total CK: 42 U/L (ref 38–234)

## 2022-02-19 LAB — MAGNESIUM: Magnesium: 2.3 mg/dL (ref 1.7–2.4)

## 2022-02-19 MED ORDER — OSELTAMIVIR PHOSPHATE 6 MG/ML PO SUSR
75.0000 mg | Freq: Two times a day (BID) | ORAL | Status: DC
  Filled 2022-02-19: qty 12.5

## 2022-02-19 MED ORDER — POTASSIUM CHLORIDE 10 MEQ/50ML IV SOLN
10.0000 meq | INTRAVENOUS | Status: AC
  Administered 2022-02-19 (×6): 10 meq via INTRAVENOUS
  Filled 2022-02-19 (×6): qty 50

## 2022-02-19 MED ORDER — POTASSIUM PHOSPHATES 15 MMOLE/5ML IV SOLN
30.0000 mmol | Freq: Once | INTRAVENOUS | Status: AC
  Administered 2022-02-19: 30 mmol via INTRAVENOUS
  Filled 2022-02-19: qty 10

## 2022-02-19 MED ORDER — SODIUM CHLORIDE 0.9 % IV SOLN
2.0000 g | Freq: Three times a day (TID) | INTRAVENOUS | Status: DC
  Administered 2022-02-19 – 2022-02-20 (×3): 2 g via INTRAVENOUS
  Filled 2022-02-19 (×3): qty 12.5

## 2022-02-19 MED ORDER — OSELTAMIVIR PHOSPHATE 6 MG/ML PO SUSR
75.0000 mg | Freq: Two times a day (BID) | ORAL | Status: AC
  Administered 2022-02-19 – 2022-02-20 (×2): 75 mg
  Filled 2022-02-19 (×2): qty 12.5

## 2022-02-19 NOTE — Progress Notes (Signed)
Patient's sister Pamala Hurry called requesting to update RN. Sister is at pt's home and states that she has found several empty 500 count bottles of ibuprofen. Sister anxious and concerned since ibuprofen on pt's allergy list. Dr. Vaughan Browner updated on conversation.

## 2022-02-19 NOTE — Progress Notes (Signed)
NAME:  Alice [name], MRN:  [id number], DOB:  1985/07/30, LOS: 6 ADMISSION DATE:  02/13/2022, CONSULTATION DATE: 02/14/2022 REFERRING MD: Dr. Carles Collet, CHIEF COMPLAINT: Respiratory failure  History of Present Illness:   This is a 37 year old female, morbidly obese, BMI 54, history of fatty liver, gastroesophageal reflux history of kidney stones, chronic left flank pain.  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 Amalia Hailey at [facility].  Patient presented to the emergency department with nausea vomiting diarrhea and a home flu test that was positive.  She was recently admitted to [facility] on 1210 through 1216 for a nongap metabolic acidosis and ulcerative lesions on the inner thighs suspected to fungal intertrigo.  Dermatology that saw her there did not feel the patient had pyoderma gangrenosum.  She was treated with fluconazole and discharged with 7 more days of fluconazole.  Recommended wound care with Aquacel Ag.  She was admitted previously at [facility] on 12/11/2021 through 12/30/2021 with cellulitis and Candida intertrigo and pneumonia with sepsis treated with IV antibiotics discharged on a 10-day door course of doxycycline.  She was seen in the emergency department her core temperature dropped to 94 she remained tachycardic and tachypneic.  Sodium was 133 BUN of 20, creatinine of 2.25 most recent in our system was within normal range.  Pregnancy test negative.  Lactic acidosis of 3.2.  She is found to be influenza be positive, influenza A COVID and RSV negative.  Patient's arterial blood gas showed a pH of 7.05, pCO2 of 30, PaO2 of 81, bicarb of 8.  She has a potassium of 3.0, leukocytosis of 18,000.  The patient was intubated and placed on mechanical life support prior to transfer.  Chest x-ray with bilateral interstitial infiltrates.  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 Amalia Hailey at [place] [facility])    Preterm labor    Round ligament pain 09/10/2012   Torn ACL      Significant Hospital Events: Including procedures, antibiotic start and stop dates in addition to other pertinent events   1/1 admitted with severe ARDS, influenza pneumonia. Proned with marked improvement in P: F ratio 1/2 supinated, O2 better, ventilation better, renal function stable but with AKI  Interim History / Subjective:   Remains on the ventilator, potassium still low Off norepinephrine drip  Objective   Blood pressure 107/60, pulse (!) 118, temperature (!) 100.7 F (38.2 C), temperature source Axillary, resp. rate (!) 22, height 5\' 9"  (1.753 m), weight (!) 148.8 kg, SpO2 100 %.    Vent Mode: PRVC FiO2 (%):  [40 %] 40 % Set Rate:  [30 bmp] 30 bmp Vt Set:  [460 mL] 460 mL PEEP:  [8 cmH20] 8 cmH20 Pressure Support:  [8 cmH20] 8 cmH20 Plateau Pressure:  [17 cmH20-19 cmH20] 18 cmH20   Intake/Output Summary (Last 24 hours) at 02/19/2022 0921 Last data filed at 02/19/2022 0700 Gross per 24 hour  Intake 2994.2 ml  Output 3165 ml  Net -170.8 ml  Filed Weights   02/13/22 1148 02/18/22 0424 02/19/22 0545  Weight: (!) 167.4 kg (!) 146.1 kg (!) 148.8 kg    Examination: Gen:      No acute distress, obese HEENT:  EOMI, sclera anicteric Neck:     No masses; no thyromegaly, ET tube Lungs:    Clear to auscultation bilaterally; normal respiratory effort CV:         Regular rate and rhythm; no murmurs Abd:      + bowel sounds; soft, non-tender; no palpable masses, no distension Ext:    No edema; adequate peripheral perfusion Skin:      Warm and dry; no rash Neuro:  Sedated  Labs/imaging reviewed Significant for potassium 2.7, creatinine 1.68, Phos 2.1 WBC 19.7, hemoglobin 9.1, platelets 197   Resolved Hospital Problem list     Assessment & Plan:   Acute hypoxemic respiratory failure requiring intubation mechanical ventilation secondary to influenza B pneumonia Possible concomitant bacterial pneumonia Severe hypoxemia and acute respiratory distress syndrome Sepsis secondary to above, septic shock Continue pressure support weans as tolerated.  Will need to be more awake before extubation On Tamiflu Broad antibiotic coverage with cefepime, linezolid for 7 days Holding diuresis for today as potassium is low  Acute Renal Failure > Improving Secondary to Sepsis  Metabolic acidosis due to renal failure Foley in place Monitor UOP  Chronic Panniculitis  Open wound on abdomen  Chronic fungal intertrigo  WOC consult  Dressing changes PRN   Hyperammonemia  1/1 RUQ [place] with hepatic steatosis Limited evaluation due to habitus Recheck ammonia levels.  Hold lactulose as GI loss may be contributing to hypokalemia  Hypokalemia, hypocalcemia: Persistent.  Despite significant replacement.  Suspect total body stores exceedingly low, likely nutritional deficiency, refeeding syndrome-abated by chronic diarrhea.  Given severity, unusual causes considered as well.  Additional investigation as below. Urine studies sent.  Malnutrition Likely initially deficient even though she has obesity Holding tube feeds at 30 due to concern for refeeding syndrome.  Best Practice (right click and "Reselect all SmartList Selections" daily)   Diet/type: tubefeeds DVT prophylaxis: prophylactic heparin  GI prophylaxis: PPI Lines: Central line Foley:  Yes, and it is still needed Code Status:  full code Last date of multidisciplinary goals of care discussion [updated patient's family at bedside.]  Critical care time:    The patient is critically ill with multiple organ  system failure and requires high complexity decision making for assessment and support, frequent evaluation and titration of therapies, advanced monitoring, review of radiographic studies and interpretation of complex data.   Critical Care Time devoted to patient care services, exclusive of separately billable procedures, described in this note is 35 minutes.   Chilton Greathouse MD [facility] See Amion for pager  If no response to pager , please call [phone] until 7pm After 7:00 pm call Elink  [phone] 02/19/2022, 9:21 AM

## 2022-02-19 NOTE — Progress Notes (Signed)
eLink Physician-Brief Progress Note Patient Name: SHATERRIA SAGER DOB: 07/07/1985 MRN: [id number]   Date of Service  02/19/2022  HPI/Events of Note  Hypokalemia - K+ = 2.7 and Creatinine = 1.68.  eICU Interventions  Will replace K+.      Intervention Category Major Interventions: Electrolyte abnormality - evaluation and management  Myan Locatelli Eugene 02/19/2022, 7:02 AM

## 2022-02-20 ENCOUNTER — Inpatient Hospital Stay (HOSPITAL_COMMUNITY): Payer: Medicaid Other

## 2022-02-20 DIAGNOSIS — J189 Pneumonia, unspecified organism: Secondary | ICD-10-CM | POA: Diagnosis not present

## 2022-02-20 LAB — URINALYSIS, ROUTINE W REFLEX MICROSCOPIC
Bilirubin Urine: NEGATIVE
Glucose, UA: NEGATIVE mg/dL
Ketones, ur: NEGATIVE mg/dL
Nitrite: NEGATIVE
Protein, ur: 100 mg/dL — AB
Specific Gravity, Urine: 1.017 (ref 1.005–1.030)
Trans Epithel, UA: 5
WBC, UA: >50 WBC/hpf — ABNORMAL HIGH (ref 0–5)
pH: 6 (ref 5.0–8.0)

## 2022-02-20 LAB — GLUCOSE, CAPILLARY
Glucose-Capillary: 125 mg/dL — ABNORMAL HIGH (ref 70–99)
Glucose-Capillary: 134 mg/dL — ABNORMAL HIGH (ref 70–99)
Glucose-Capillary: 136 mg/dL — ABNORMAL HIGH (ref 70–99)
Glucose-Capillary: 142 mg/dL — ABNORMAL HIGH (ref 70–99)
Glucose-Capillary: 144 mg/dL — ABNORMAL HIGH (ref 70–99)
Glucose-Capillary: 177 mg/dL — ABNORMAL HIGH (ref 70–99)

## 2022-02-20 LAB — BASIC METABOLIC PANEL
Anion gap: 5 (ref 5–15)
BUN: 46 mg/dL — ABNORMAL HIGH (ref 6–20)
CO2: 17 mmol/L — ABNORMAL LOW (ref 22–32)
Calcium: 8.2 mg/dL — ABNORMAL LOW (ref 8.9–10.3)
Chloride: 123 mmol/L — ABNORMAL HIGH (ref 98–111)
Creatinine, Ser: 1.29 mg/dL — ABNORMAL HIGH (ref 0.44–1.00)
GFR, Estimated: 55 mL/min — ABNORMAL LOW (ref >60)
Glucose, Bld: 131 mg/dL — ABNORMAL HIGH (ref 70–99)
Potassium: 3.2 mmol/L — ABNORMAL LOW (ref 3.5–5.1)
Sodium: 145 mmol/L (ref 135–145)

## 2022-02-20 LAB — PHOSPHORUS: Phosphorus: 3.3 mg/dL (ref 2.5–4.6)

## 2022-02-20 LAB — CBC
HCT: 26.6 % — ABNORMAL LOW (ref 36.0–46.0)
Hemoglobin: 8.6 g/dL — ABNORMAL LOW (ref 12.0–15.0)
MCH: 30.2 pg (ref 26.0–34.0)
MCHC: 32.3 g/dL (ref 30.0–36.0)
MCV: 93.3 fL (ref 80.0–100.0)
Platelets: 217 10*3/uL (ref 150–400)
RBC: 2.85 MIL/uL — ABNORMAL LOW (ref 3.87–5.11)
RDW: 17.1 % — ABNORMAL HIGH (ref 11.5–15.5)
WBC: 22.1 10*3/uL — ABNORMAL HIGH (ref 4.0–10.5)
nRBC: 0.9 % — ABNORMAL HIGH (ref 0.0–0.2)

## 2022-02-20 MED ORDER — OLANZAPINE 10 MG PO TABS
10.0000 mg | ORAL_TABLET | Freq: Every day | ORAL | Status: DC
  Administered 2022-02-20: 10 mg
  Filled 2022-02-20 (×2): qty 1

## 2022-02-20 MED ORDER — POTASSIUM CHLORIDE 20 MEQ PO PACK
40.0000 meq | PACK | Freq: Every day | ORAL | Status: DC
  Administered 2022-02-20: 40 meq via ORAL
  Filled 2022-02-20: qty 2

## 2022-02-20 MED ORDER — QUETIAPINE FUMARATE 50 MG PO TABS: 25.0000 mg | ORAL_TABLET | Freq: Two times a day (BID) | ORAL | Status: DC

## 2022-02-20 MED ORDER — PIPERACILLIN-TAZOBACTAM 3.375 G IVPB
3.3750 g | Freq: Three times a day (TID) | INTRAVENOUS | Status: DC
  Administered 2022-02-20 – 2022-02-23 (×9): 3.375 g via INTRAVENOUS
  Filled 2022-02-20 (×10): qty 50

## 2022-02-20 NOTE — Plan of Care (Signed)

## 2022-02-20 NOTE — Progress Notes (Addendum)
NAME:  Alice [name], MRN:  [id number], DOB:  1985-12-26, LOS: 7 ADMISSION DATE:  02/13/2022, CONSULTATION DATE: 02/14/2022 REFERRING MD: Dr. Carles Collet, CHIEF COMPLAINT: Respiratory failure  History of Present Illness:   This is a 37 year old female, morbidly obese, BMI 54, history of fatty liver, gastroesophageal reflux history of kidney stones, chronic left flank pain.  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 Amalia Hailey at [facility].  Patient presented to the emergency department with nausea vomiting diarrhea and a home flu test that was positive.  She was recently admitted to [facility] on 1210 through 1216 for a nongap metabolic acidosis and ulcerative lesions on the inner thighs suspected to fungal intertrigo.  Dermatology that saw her there did not feel the patient had pyoderma gangrenosum.  She was treated with fluconazole and discharged with 7 more days of fluconazole.  Recommended wound care with Aquacel Ag.  She was admitted previously at [facility] on 12/11/2021 through 12/30/2021 with cellulitis and Candida intertrigo and pneumonia with sepsis treated with IV antibiotics discharged on a 10-day door course of doxycycline.  She was seen in the emergency department her core temperature dropped to 94 she remained tachycardic and tachypneic.  Sodium was 133 BUN of 20, creatinine of 2.25 most recent in our system was within normal range.  Pregnancy test negative.  Lactic acidosis of 3.2.  She is found to be influenza be positive, influenza A COVID and RSV negative.  Patient's arterial blood gas showed a pH of 7.05, pCO2 of 30, PaO2 of 81, bicarb of 8.  She has a potassium of 3.0, leukocytosis of 18,000.  The patient was intubated and placed on mechanical life support prior to transfer.  Chest x-ray with bilateral interstitial infiltrates.  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 Amalia Hailey at [place] obesity [facility])    Preterm labor    Round ligament pain 09/10/2012   Torn ACL      Significant Hospital Events: Including procedures, antibiotic start and stop dates in addition to other pertinent events   1/1 admitted with severe ARDS, influenza pneumonia. Proned with marked improvement in P: F ratio 1/2 supinated, O2 better, ventilation better, renal function stable but with AKI 1/5 remains on the ventilator, sedated.  Repeating potassium.  Off pressors  Interim History / Subjective:   Remains on the ventilator, potassium still low Patient spiked to 102 today morning  Objective   Blood pressure 124/82, pulse (!) 119, temperature 98.4 F (36.9 C), temperature source Oral, resp. rate 16, height 5\' 9"  (1.753 m), weight (!) 150.1 kg, SpO2 100 %.    Vent Mode: PRVC FiO2 (%):  [30 %-40 %] 30 % Set Rate:  [30 bmp] 30 bmp Vt Set:  [460 mL] 460 mL PEEP:  [8 cmH20] 8 cmH20 Plateau Pressure:  [3 cmH20-25 cmH20] 3 cmH20   Intake/Output Summary (Last 24 hours) at 02/20/2022 0739 Last data filed at 02/20/2022 0646 Gross per 24 hour  Intake 3731.36 ml  Output 3325  ml  Net 406.36 ml   Filed Weights   02/18/22 0424 02/19/22 0545 02/20/22 0500  Weight: (!) 146.1 kg (!) 148.8 kg (!) 150.1 kg    Examination: Gen:      No acute distress, obese HEENT:  EOMI, sclera anicteric Neck:     No masses; no thyromegaly, ETT Lungs:    Clear to auscultation bilaterally; normal respiratory effort CV:         Regular rate and rhythm; no murmurs Abd:      + bowel sounds; soft, non-tender; no palpable masses, no distension Ext:    No edema; adequate peripheral perfusion Skin:       Shallow-based ulcers in the lower part of the abdomen, groin.  Open wound dressed with mild erythema.  No discharge noted. Neuro: Sedated  Labs/imaging reviewed Significant for potassium 3.2, creatinine 1.29 WBC count higher at 22.1, hemoglobin 8.6, platelets 217  Resolved Hospital Problem list     Assessment & Plan:   Acute hypoxemic respiratory failure requiring intubation mechanical ventilation secondary to influenza B pneumonia Possible concomitant bacterial pneumonia Severe hypoxemia and acute respiratory distress syndrome Sepsis secondary to above, septic shock Continue pressure support weans as tolerated.  Will need to be more awake before extubation On Tamiflu Holding diuresis for today as potassium is low  Leukocytosis, fevers today Unclear source of infection Check tracheal aspirate, UA, chest x-ray Change cefepime to Zosyn, continue linezolid Remove central line once peripherals are obtained Remove Foley  Acute Renal Failure > Improving Secondary to Sepsis  Metabolic acidosis due to renal failure Foley in place She has excellent urine output.  Chronic Panniculitis  Open wound on abdomen  Chronic fungal intertrigo  Wounds look stable.  We will continue to monitor. WOC consult  Dressing changes PRN   Hyperammonemia  1/1 RUQ [place] with hepatic steatosis Limited evaluation due to habitus Repeat ammonia levels is 42.  Hold lactulose as GI loss may be contributing to hypokalemia  Hypokalemia, hypophosphatemia: Persistent.  Despite significant replacement.  Suspect total body stores exceedingly low, likely nutritional deficiency, refeeding syndrome-worsen by chronic diarrhea.    Malnutrition Likely initially deficient even though she has obesity Holding tube feeds at 30 per hour due to concern for refeeding syndrome.  Possible ibuprofen toxicity.   Sister reports finding multiple empty bottles of ibuprofen at home. Continue supportive care  Delirium Unable to  wean sedation due to ongoing delirium She is on prozac, gaba, olanzapine, suboxone, trazodone  Resume olanzapine today  Best Practice (right click and "Reselect all SmartList Selections" daily)   Diet/type: tubefeeds DVT prophylaxis: LMWH GI prophylaxis: PPI Lines: No longer needed.  Order written to d/c  Foley:  Yes, and it is no longer needed Code Status:  full code Last date of multidisciplinary goals of care discussion [.]  Critical care time:    The patient is critically ill with multiple organ system failure and requires high complexity decision making for assessment and support, frequent evaluation and titration of therapies, advanced monitoring, review of radiographic studies and interpretation of complex data.   Critical Care Time devoted to patient care services, exclusive of separately billable procedures, described in this note is 35 minutes.   Chilton Greathouse MD [facility] See Amion for pager  If no response to pager , please call [phone] until 7pm After 7:00 pm call Elink  [phone] 02/20/2022, 7:48 AM

## 2022-02-20 NOTE — Progress Notes (Signed)
PHARMACY NOTE -  [facility] has been assisting with dosing of Zosyn for VAP. Dosage remains stable at 3.375 g IV q8 hr and further renal adjustments per institutional Pharmacy antibiotic protocol  Pharmacy will sign off, following peripherally for culture results, dose adjustments, and length of therapy. Please reconsult if a change in clinical status warrants re-evaluation of dosage.  Reuel Boom, PharmD, BCPS [phone] 02/20/2022, 9:10 AM

## 2022-02-21 ENCOUNTER — Inpatient Hospital Stay (HOSPITAL_COMMUNITY): Payer: Medicaid Other

## 2022-02-21 DIAGNOSIS — J189 Pneumonia, unspecified organism: Secondary | ICD-10-CM | POA: Diagnosis not present

## 2022-02-21 LAB — BASIC METABOLIC PANEL
Anion gap: 9 (ref 5–15)
BUN: 47 mg/dL — ABNORMAL HIGH (ref 6–20)
CO2: 17 mmol/L — ABNORMAL LOW (ref 22–32)
Calcium: 8.6 mg/dL — ABNORMAL LOW (ref 8.9–10.3)
Chloride: 123 mmol/L — ABNORMAL HIGH (ref 98–111)
Creatinine, Ser: 0.96 mg/dL (ref 0.44–1.00)
GFR, Estimated: >60 mL/min (ref >60)
Glucose, Bld: 160 mg/dL — ABNORMAL HIGH (ref 70–99)
Potassium: 3.4 mmol/L — ABNORMAL LOW (ref 3.5–5.1)
Sodium: 149 mmol/L — ABNORMAL HIGH (ref 135–145)

## 2022-02-21 LAB — COMPREHENSIVE METABOLIC PANEL
ALT: 15 U/L (ref 0–44)
AST: 20 U/L (ref 15–41)
Albumin: 1.8 g/dL — ABNORMAL LOW (ref 3.5–5.0)
Alkaline Phosphatase: 120 U/L (ref 38–126)
Anion gap: 7 (ref 5–15)
BUN: 49 mg/dL — ABNORMAL HIGH (ref 6–20)
CO2: 15 mmol/L — ABNORMAL LOW (ref 22–32)
Calcium: 8.6 mg/dL — ABNORMAL LOW (ref 8.9–10.3)
Chloride: 124 mmol/L — ABNORMAL HIGH (ref 98–111)
Creatinine, Ser: 1.18 mg/dL — ABNORMAL HIGH (ref 0.44–1.00)
GFR, Estimated: >60 mL/min (ref >60)
Glucose, Bld: 139 mg/dL — ABNORMAL HIGH (ref 70–99)
Potassium: 3.3 mmol/L — ABNORMAL LOW (ref 3.5–5.1)
Sodium: 146 mmol/L — ABNORMAL HIGH (ref 135–145)
Total Bilirubin: 0.5 mg/dL (ref 0.3–1.2)
Total Protein: 5.5 g/dL — ABNORMAL LOW (ref 6.5–8.1)

## 2022-02-21 LAB — GLUCOSE, CAPILLARY
Glucose-Capillary: 148 mg/dL — ABNORMAL HIGH (ref 70–99)
Glucose-Capillary: 158 mg/dL — ABNORMAL HIGH (ref 70–99)
Glucose-Capillary: 156 mg/dL — ABNORMAL HIGH (ref 70–99)
Glucose-Capillary: 156 mg/dL — ABNORMAL HIGH (ref 70–99)
Glucose-Capillary: 175 mg/dL — ABNORMAL HIGH (ref 70–99)
Glucose-Capillary: 172 mg/dL — ABNORMAL HIGH (ref 70–99)

## 2022-02-21 LAB — TRIGLYCERIDES: Triglycerides: 339 mg/dL — ABNORMAL HIGH (ref <150)

## 2022-02-21 LAB — CBC
HCT: 27.6 % — ABNORMAL LOW (ref 36.0–46.0)
Hemoglobin: 8.8 g/dL — ABNORMAL LOW (ref 12.0–15.0)
MCH: 30.9 pg (ref 26.0–34.0)
MCHC: 31.9 g/dL (ref 30.0–36.0)
MCV: 96.8 fL (ref 80.0–100.0)
Platelets: 230 10*3/uL (ref 150–400)
RBC: 2.85 MIL/uL — ABNORMAL LOW (ref 3.87–5.11)
RDW: 17.5 % — ABNORMAL HIGH (ref 11.5–15.5)
WBC: 22.4 10*3/uL — ABNORMAL HIGH (ref 4.0–10.5)
nRBC: 0.7 % — ABNORMAL HIGH (ref 0.0–0.2)

## 2022-02-21 LAB — PHOSPHORUS: Phosphorus: 4 mg/dL (ref 2.5–4.6)

## 2022-02-21 LAB — CK: Total CK: 39 U/L (ref 38–234)

## 2022-02-21 MED ORDER — POTASSIUM CHLORIDE 20 MEQ PO PACK
40.0000 meq | PACK | Freq: Two times a day (BID) | ORAL | Status: DC
  Administered 2022-02-21 (×2): 40 meq
  Filled 2022-02-21 (×2): qty 2

## 2022-02-21 MED ORDER — FREE WATER
200.0000 mL | Status: DC
  Administered 2022-02-21 – 2022-02-22 (×5): 200 mL

## 2022-02-21 MED ORDER — OLANZAPINE 5 MG PO TABS
5.0000 mg | ORAL_TABLET | Freq: Every day | ORAL | Status: DC
  Administered 2022-02-21: 5 mg
  Filled 2022-02-21 (×2): qty 1

## 2022-02-21 MED ORDER — POTASSIUM CHLORIDE 10 MEQ/100ML IV SOLN
10.0000 meq | INTRAVENOUS | Status: AC
  Administered 2022-02-21 (×4): 10 meq via INTRAVENOUS
  Filled 2022-02-21 (×4): qty 100

## 2022-02-21 MED ORDER — STERILE WATER FOR INJECTION IV SOLN
INTRAVENOUS | Status: DC
  Filled 2022-02-21 (×2): qty 150
  Filled 2022-02-21: qty 1000

## 2022-02-21 NOTE — Progress Notes (Signed)
NAME:  Alice [name], MRN:  [id number], DOB:  1985-11-12, LOS: 8 ADMISSION DATE:  02/13/2022, CONSULTATION DATE: 02/14/2022 REFERRING MD: Dr. Carles Collet, CHIEF COMPLAINT: Respiratory failure  History of Present Illness:   This is a 37 year old female, morbidly obese, BMI 54, history of fatty liver, gastroesophageal reflux history of kidney stones, chronic left flank pain.  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 Amalia Hailey at [facility].  Patient presented to the emergency department with nausea vomiting diarrhea and a home flu test that was positive.  She was recently admitted to [facility] on 1210 through 1216 for a nongap metabolic acidosis and ulcerative lesions on the inner thighs suspected to fungal intertrigo.  Dermatology that saw her there did not feel the patient had pyoderma gangrenosum.  She was treated with fluconazole and discharged with 7 more days of fluconazole.  Recommended wound care with Aquacel Ag.  She was admitted previously at [facility] on 12/11/2021 through 12/30/2021 with cellulitis and Candida intertrigo and pneumonia with sepsis treated with IV antibiotics discharged on a 10-day door course of doxycycline.  She was seen in the emergency department her core temperature dropped to 94 she remained tachycardic and tachypneic.  Sodium was 133 BUN of 20, creatinine of 2.25 most recent in our system was within normal range.  Pregnancy test negative.  Lactic acidosis of 3.2.  She is found to be influenza be positive, influenza A COVID and RSV negative.  Patient's arterial blood gas showed a pH of 7.05, pCO2 of 30, PaO2 of 81, bicarb of 8.  She has a potassium of 3.0, leukocytosis of 18,000.  The patient was intubated and placed on mechanical life support prior to transfer.  Chest x-ray with bilateral interstitial infiltrates.  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 Amalia Hailey at [facility])    Preterm labor    Round ligament pain 09/10/2012   Torn ACL      Significant Hospital Events: Including procedures, antibiotic start and stop dates in addition to other pertinent events   1/1 admitted with severe ARDS, influenza pneumonia. Proned with marked improvement in P: F ratio 1/2 supinated, O2 better, ventilation better, renal function stable but with AKI 1/5 remains on the ventilator, sedated.  Repeating potassium.  Off pressors 1/7 Fever/leukocytosis, started on empric abx with unclear source.   Interim History / Subjective:  Fever this morning Remains on vent More alert and interactive this morning per family and RN.   Objective   Blood pressure (!) 116/52, pulse (!) 109, temperature 100.3 F (37.9 C), temperature source Axillary, resp. rate 15, height 5\' 9"  (1.753 m), weight (!) 149.7 kg, SpO2 100 %.    Vent Mode: PRVC FiO2 (%):  [30 %-40 %] 40 % Set Rate:  [30 bmp] 30 bmp Vt Set:  [460 mL] 460 mL PEEP:  [8 cmH20] 8 cmH20 Pressure Support:  [8 cmH20] 8 cmH20 Plateau Pressure:  [19 cmH20-20 cmH20] 20 cmH20   Intake/Output Summary (Last 24  hours) at 02/21/2022 3710 Last data filed at 02/21/2022 0800 Gross per 24 hour  Intake 1936.31 ml  Output 2475 ml  Net -538.69 ml    Filed Weights   02/19/22 0545 02/20/22 0500 02/21/22 0500  Weight: (!) 148.8 kg (!) 150.1 kg (!) 149.7 kg    Examination:  Gen:      Morbidly obese female in NAD HEENT:  [place]/AT, PERRL Neck:     No mass, no JVD Lungs:    Clear bilateral breath sounds CV:         RRR, no MRG Abd:      Soft, NT, ND. TF at 30 Ext:   No edema or deformity Skin:      ulcerations in  the left lower abdomen and groin. Erythema as well.  Neuro:  Somnolent this morning.   Labs/imaging reviewed Significant for potassium 3.3, creatinine 1.18, Trig 339 WBC count higher at 22.4, hemoglobin 8.8, platelets 230  1675 UOP x 24 hours. +437mL total for admission.   Resolved Hospital Problem list   Septic shock  Assessment & Plan:   Acute hypoxemic respiratory failure secondary to influenza B pneumonia Possible concomitant bacterial pneumonia Severe hypoxemia and acute respiratory distress syndrome > improved Sepsis - Continue pressure support weans as tolerated.  Will need to be more awake before - extubation - On Tamiflu - Holding diuresis for today as potassium is low - Propofol DC in hopes of extubation. Will start Precedex if unable to extubate. Trig high and may be helpful weaning.   Leukocytosis, fever - Tracheal aspirate pending - Zosyn, Linezolid - Remove Foley - Check PCT  Acute Renal Failure > Improving Secondary to Sepsis  Metabolic acidosis due to renal failure + nongap due to diarrhea.  She has excellent urine output. - monitor - Trend BMP - Start Bicarb infusion low rate with concern for hypokalemia.   Chronic Panniculitis  Open wound on abdomen  Chronic fungal intertrigo  Wounds look stable.  We will continue to monitor. - WOC consult  - Dressing changes PRN   Hyperammonemia  1/1 RUQ [place] with hepatic steatosis Limited evaluation due to habitus Repeat ammonia levels is 42.   - Hold lactulose as GI loss may be contributing to hypokalemia - repeat ammonia in AM  Hypokalemia, hypophosphatemia: Persistent.  Despite significant replacement.  Suspect total body stores exceedingly low, likely nutritional deficiency, refeeding syndrome-worsen by chronic diarrhea.   - Lactulose held - potassium BID x 4 doses.   Malnutrition Likely initially deficient even though she has obesity - Holding tube feeds at 30 per hour due to concern for refeeding  syndrome.  Possible ibuprofen toxicity.   - Sister reports finding multiple empty bottles of ibuprofen at home. - Continue supportive care  Delirium She is on prozac, gaba, olanzapine, suboxone, trazodone chronically - Continue olanzapine - Somnolent this morning, will not escalate further today.    Best Practice (right click and "Reselect all SmartList Selections" daily)   Diet/type: tubefeeds DVT prophylaxis: LMWH GI prophylaxis: PPI Lines: No longer needed.  Order written to d/c  Foley:  Yes, and it is no longer needed Code Status:  full code Last date of multidisciplinary goals of care discussion [.]  Critical care time: 38 minutes    Joneen Roach, AGACNP-BC [facility]  See Amion for personal pager PCCM on call pager [phone] until 7pm. Please call Elink 7p-7a. [phone]  02/21/2022 8:39 AM

## 2022-02-21 NOTE — TOC Progression Note (Addendum)
Transition of Care [facility]) - Progression Note    Patient Details  Name: Alice [name] MRN: [id number] Date of Birth: [birth year]  Transition [facility] CM/SW White, RN Phone Number: 02/21/2022, 2:12 PM  Clinical Narrative:   Pre chart review, patient remains on full ventilation support. TOC will continue to follow for discharge needs.       Barriers to Discharge: Continued Medical Work up  Expected Discharge Plan and Services In-house Referral: NA Discharge Planning Services: CM Consult Post Acute Care Choice: Durable Medical Equipment Living arrangements for the past 2 months: Apartment                                       Social Determinants of Health (SDOH) Interventions SDOH Screenings   Food Insecurity: No Food Insecurity (02/17/2022)  Housing: Low Risk  (02/17/2022)  Depression (PHQ2-9): Low Risk  (01/24/2019)  Recent Concern: Depression (PHQ2-9) - Medium Risk (12/09/2018)  Tobacco Use: High Risk (02/13/2022)    Readmission Risk Interventions     No data to display

## 2022-02-22 DIAGNOSIS — J189 Pneumonia, unspecified organism: Secondary | ICD-10-CM | POA: Diagnosis not present

## 2022-02-22 LAB — BASIC METABOLIC PANEL
Anion gap: 6 (ref 5–15)
BUN: 44 mg/dL — ABNORMAL HIGH (ref 6–20)
CO2: 16 mmol/L — ABNORMAL LOW (ref 22–32)
Calcium: 8.2 mg/dL — ABNORMAL LOW (ref 8.9–10.3)
Chloride: 125 mmol/L — ABNORMAL HIGH (ref 98–111)
Creatinine, Ser: 0.93 mg/dL (ref 0.44–1.00)
GFR, Estimated: >60 mL/min (ref >60)
Glucose, Bld: 135 mg/dL — ABNORMAL HIGH (ref 70–99)
Potassium: 3.6 mmol/L (ref 3.5–5.1)
Sodium: 147 mmol/L — ABNORMAL HIGH (ref 135–145)

## 2022-02-22 LAB — BLOOD GAS, ARTERIAL
Acid-base deficit: 6.8 mmol/L — ABNORMAL HIGH (ref 0.0–2.0)
Bicarbonate: 17.7 mmol/L — ABNORMAL LOW (ref 20.0–28.0)
Drawn by: 422461
FIO2: 40 %
O2 Saturation: 97.7 %
PEEP: 5 cmH2O
Patient temperature: 37.6
pCO2 arterial: 33 mmHg (ref 32–48)
pH, Arterial: 7.34 — ABNORMAL LOW (ref 7.35–7.45)
pO2, Arterial: 101 mmHg (ref 83–108)

## 2022-02-22 LAB — GLUCOSE, CAPILLARY
Glucose-Capillary: 125 mg/dL — ABNORMAL HIGH (ref 70–99)
Glucose-Capillary: 146 mg/dL — ABNORMAL HIGH (ref 70–99)
Glucose-Capillary: 138 mg/dL — ABNORMAL HIGH (ref 70–99)
Glucose-Capillary: 122 mg/dL — ABNORMAL HIGH (ref 70–99)
Glucose-Capillary: 121 mg/dL — ABNORMAL HIGH (ref 70–99)

## 2022-02-22 LAB — AMMONIA: Ammonia: 38 umol/L — ABNORMAL HIGH (ref 9–35)

## 2022-02-22 LAB — DIFFERENTIAL
Abs Immature Granulocytes: 2.72 10*3/uL — ABNORMAL HIGH (ref 0.00–0.07)
Band Neutrophils: 2 %
Basophils Absolute: 0 10*3/uL (ref 0.0–0.1)
Basophils Relative: 0 %
Blasts: 0 %
Eosinophils Absolute: 0 10*3/uL (ref 0.0–0.5)
Eosinophils Relative: 0 %
Lymphocytes Relative: 12 %
Lymphs Abs: 2.7 10*3/uL (ref 0.7–4.0)
Metamyelocytes Relative: 5 %
Monocytes Absolute: 2 10*3/uL — ABNORMAL HIGH (ref 0.1–1.0)
Monocytes Relative: 9 %
Myelocytes: 7 %
Neutro Abs: 15.3 10*3/uL — ABNORMAL HIGH (ref 1.7–7.7)
Neutrophils Relative %: 65 %
Other: 0 %
Promyelocytes Relative: 0 %
nRBC: 0 /100 WBC

## 2022-02-22 LAB — CBC
HCT: 26.5 % — ABNORMAL LOW (ref 36.0–46.0)
Hemoglobin: 8.1 g/dL — ABNORMAL LOW (ref 12.0–15.0)
MCH: 30 pg (ref 26.0–34.0)
MCHC: 30.6 g/dL (ref 30.0–36.0)
MCV: 98.1 fL (ref 80.0–100.0)
Platelets: 245 10*3/uL (ref 150–400)
RBC: 2.7 MIL/uL — ABNORMAL LOW (ref 3.87–5.11)
RDW: 17.7 % — ABNORMAL HIGH (ref 11.5–15.5)
WBC: 22.7 10*3/uL — ABNORMAL HIGH (ref 4.0–10.5)
nRBC: 0.8 % — ABNORMAL HIGH (ref 0.0–0.2)

## 2022-02-22 LAB — SEDIMENTATION RATE: Sed Rate: 85 mm/hr — ABNORMAL HIGH (ref 0–22)

## 2022-02-22 LAB — PHOSPHORUS: Phosphorus: 2.9 mg/dL (ref 2.5–4.6)

## 2022-02-22 LAB — MAGNESIUM: Magnesium: 2.2 mg/dL (ref 1.7–2.4)

## 2022-02-22 LAB — PROCALCITONIN: Procalcitonin: 0.2 ng/mL

## 2022-02-22 LAB — CULTURE, RESPIRATORY W GRAM STAIN

## 2022-02-22 LAB — C-REACTIVE PROTEIN: CRP: 0.7 mg/dL (ref <1.0)

## 2022-02-22 MED ORDER — VITAL 1.5 CAL PO LIQD
1000.0000 mL | ORAL | Status: DC
  Administered 2022-02-22: 1000 mL
  Filled 2022-02-22: qty 1000

## 2022-02-22 MED ORDER — DOCUSATE SODIUM 100 MG PO CAPS
100.0000 mg | ORAL_CAPSULE | Freq: Two times a day (BID) | ORAL | Status: DC
  Administered 2022-02-26 (×2): 100 mg via ORAL
  Filled 2022-02-22 (×8): qty 1

## 2022-02-22 MED ORDER — ORAL CARE MOUTH RINSE
15.0000 mL | OROMUCOSAL | Status: DC
  Administered 2022-02-22 – 2022-02-28 (×23): 15 mL via OROMUCOSAL

## 2022-02-22 MED ORDER — GUAIFENESIN-DM 100-10 MG/5ML PO SYRP
15.0000 mL | ORAL_SOLUTION | Freq: Four times a day (QID) | ORAL | Status: DC
  Administered 2022-02-23 – 2022-02-27 (×9): 15 mL via ORAL
  Filled 2022-02-22 (×12): qty 20

## 2022-02-22 MED ORDER — POTASSIUM CHLORIDE 10 MEQ/100ML IV SOLN
10.0000 meq | INTRAVENOUS | Status: AC
  Administered 2022-02-22 (×4): 10 meq via INTRAVENOUS
  Filled 2022-02-22 (×4): qty 100

## 2022-02-22 MED ORDER — POTASSIUM CHLORIDE 20 MEQ PO PACK: 40.0000 meq | PACK | Freq: Three times a day (TID) | ORAL | Status: DC

## 2022-02-22 MED ORDER — OLANZAPINE 5 MG PO TABS
5.0000 mg | ORAL_TABLET | Freq: Every day | ORAL | Status: DC
  Filled 2022-02-22: qty 1

## 2022-02-22 MED ORDER — POTASSIUM CHLORIDE 20 MEQ PO PACK
40.0000 meq | PACK | Freq: Three times a day (TID) | ORAL | Status: DC
  Administered 2022-02-22: 40 meq
  Filled 2022-02-22: qty 2

## 2022-02-22 NOTE — Progress Notes (Signed)
eLink Physician-Brief Progress Note Patient Name: Alice [name] DOB: 1985/05/19 MRN: [id number]   Date of Service  02/22/2022  HPI/Events of Note  Notified of vent desynchrony On PRVC 30/460/30%/5 PEEP, peak pressure ranges from 13 -23 Receiving prn Versed and Fentanyl Acidosis improving  eICU Interventions  Switched to CPAP 5/5 with improvement in work of breathing. TV generated in the 500s. Will keep on this setting with ensured back up vent in the event she has apneic episodes. ABG in an hour. Discussed with RT and BSRN     Intervention Category Intermediate Interventions: Other:  Judd Lien 02/22/2022, 2:03 AM

## 2022-02-22 NOTE — Progress Notes (Signed)
NAME:  Alice [name], MRN:  [id number], DOB:  1985/10/14, LOS: 9 ADMISSION DATE:  02/13/2022, CONSULTATION DATE: 02/14/2022 REFERRING MD: Dr. Arbutus Leas, CHIEF COMPLAINT: Respiratory failure  History of Present Illness:   This is a 37 year old female, morbidly obese, BMI 54, history of fatty liver, gastroesophageal reflux history of kidney stones, chronic left flank pain.  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 [name] at [facility].  Patient presented to the emergency department with nausea vomiting diarrhea and a home flu test that was positive.  She was recently admitted to [facility] on 1210 through 1216 for a nongap metabolic acidosis and ulcerative lesions on the inner thighs suspected to fungal intertrigo.  Dermatology that saw her there did not feel the patient had pyoderma gangrenosum.  She was treated with fluconazole and discharged with 7 more days of fluconazole.  Recommended wound care with Aquacel Ag.  She was admitted previously at [facility] on 12/11/2021 through 12/30/2021 with cellulitis and Candida intertrigo and pneumonia with sepsis treated with IV antibiotics discharged on a 10-day door course of doxycycline.  She was seen in the emergency department her core temperature dropped to 94 she remained tachycardic and tachypneic.  Sodium was 133 BUN of 20, creatinine of 2.25 most recent in our system was within normal range.  Pregnancy test negative.  Lactic acidosis of 3.2.  She is found to be influenza be positive, influenza A COVID and RSV negative.  Patient's arterial blood gas showed a pH of 7.05, pCO2 of 30, PaO2 of 81, bicarb of 8.  She has a potassium of 3.0, leukocytosis of 18,000.  The patient was intubated and placed on mechanical life support prior to transfer.  Chest x-ray with bilateral interstitial infiltrates.  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 [name] at [facility]   Morbid obesity [facility])    Preterm labor    Round ligament pain 09/10/2012   Torn ACL      Significant Hospital Events: Including procedures, antibiotic start and stop dates in addition to other pertinent events   1/1 admitted with severe ARDS, influenza pneumonia. Proned with marked improvement in P: F ratio 1/2 supinated, O2 better, ventilation better, renal function stable but with AKI 1/5 remains on the ventilator, sedated.  Repeating potassium.  Off pressors 1/7 Fever/leukocytosis, started on empric abx with unclear source.   Interim History / Subjective:  Slowly waking up. Following more commands this morning. Seems to be regaining some strength. Some agitation overnight related to vent dyssynchrony improved with change to pressure support, which she has been tolerating well.    Objective   Blood pressure (!) 143/72, pulse (!) 110, temperature 99.5 F (37.5 C), temperature source Axillary, resp. rate 17, height 5\' 9"  (1.753 m), weight (!) 147.4 kg, SpO2 98 %.    Vent Mode: PSV;CPAP FiO2 (%):  [40 %] 40 % Set Rate:  [30 bmp] 30 bmp Vt Set:  [460 mL] 460 mL PEEP:  [5 cmH20] 5 cmH20 Pressure Support:  [5  cmH20] 5 cmH20 Plateau Pressure:  [24 cmH20] 24 cmH20   Intake/Output Summary (Last 24 hours) at 02/22/2022 0739 Last data filed at 02/22/2022 0655 Gross per 24 hour  Intake 3958.86 ml  Output 2625 ml  Net 1333.86 ml    Filed Weights   02/20/22 0500 02/21/22 0500 02/22/22 0352  Weight: (!) 150.1 kg (!) 149.7 kg (!) 147.4 kg    Examination:  Gen:      Morbidly obese adult female in NAD on vent.  HEENT:  [place]/AT, PERRL, no JVD Lungs:    Coarse bilaterally, clears  with suctioning.  CV:         RRR, no MRG Abd:      Soft, NT, ND Ext:   L hand edema. Pulse intact.  Skin:      Left lower abdomen and groin ulcerations.  Neuro:  Somnolent, but will arouse to verbal and follow simple commands.    Labs/imaging reviewed Significant for potassium 3.6, creatinine 0.93,  WBC count higher at 22.7, hemoglobin 8.1, platelets 245  1675 UOP x 24 hours. +415mL total for admission.   Resolved Hospital Problem list   Septic shock  Assessment & Plan:   Acute hypoxemic respiratory failure secondary to influenza B pneumonia Possible concomitant bacterial pneumonia Severe hypoxemia and acute respiratory distress syndrome > improved Sepsis - Continue pressure support weans as tolerated.  Will need to be more awake before - extubation - Tamiflu course completed - Propofol DC in hopes of extubation. Will start Precedex if unable to extubate. Trig high and may be helpful weaning.   Leukocytosis, fever Continues to be febrile. WBC increasing to 22.7 1/9 - Tracheal aspirate pending - Zosyn day 3, Linezolid day 8 (today is day 10 of total ABX) - Remove Foley - Trend PCT (0.2 1/9)  Acute Renal Failure > Improving Secondary to Sepsis  Metabolic acidosis due to renal failure + nongap due to diarrhea.  She has excellent urine output. - monitor - Trend BMP - Continue Bicarb - Continue oral potassium supplementation TID through 1/10 and reassess.  Chronic Panniculitis  Open wound on abdomen  Chronic fungal intertrigo  Wounds look stable.  We will continue to monitor. - WOC consult  - Dressing changes PRN  - CT didn't show any soft tissue infection on admission.   Hyperammonemia  1/1 RUQ [place] with hepatic steatosis Limited evaluation due to habitus Repeat ammonia levels is 42.   - Hold lactulose as GI loss may be contributing to hypokalemia - ammonia 38 with improved mentation. DC lactulose.   Hypokalemia, hypophosphatemia: Persistent.  Despite significant  replacement.  Suspect total body stores exceedingly low, likely nutritional deficiency, refeeding syndrome-worsen by chronic diarrhea.   - Lactulose DC - potassium supp as above  Malnutrition Likely initially deficient even though she has obesity - Holding tube feeds at 30 per hour due to concern for refeeding syndrome.  Possible ibuprofen toxicity.   - Sister reports finding multiple empty bottles of ibuprofen at home. - Continue supportive care  Delirium She is on prozac, gaba, olanzapine, suboxone, trazodone chronically - Continue olanzapine, reduced dose.  - Somnolent this morning, will not escalate further today.    Best Practice (right click and "Reselect all SmartList Selections" daily)   Diet/type: tubefeeds DVT prophylaxis: LMWH GI prophylaxis: PPI Lines: No longer needed.  Order written to d/c  Foley:  Yes, and it is no longer needed Code Status:  full code Last date of multidisciplinary goals of care discussion [.]  Critical  care time: 39 minutes    Joneen Roach, AGACNP-BC [facility]  See Amion for personal pager PCCM on call pager [phone] until 7pm. Please call Elink 7p-7a. [phone]  02/22/2022 7:39 AM

## 2022-02-22 NOTE — Evaluation (Signed)
Physical Therapy Evaluation Patient Details Name: Alice [name] MRN: [id number] DOB: 01-17-1986 Today's Date: 02/22/2022  History of Present Illness  Patient is a 37 y.o. female presented to the emergency department with nausea vomiting diarrhea and a home flu test that was positive. Pt with multiple recent hospitalizations at outside hospitals. Pt at admitted to [facility] 12/11/21-12/30/21 with cellulitis and Candida intertrigo and pneumonia with sepsis treated with IV antibiotics discharged on a 10-day door course of doxycycline.Then pt admitted to [facility] from 72/53-66/44 for metabolic acidosis and ulcerative lesions on the inner thighs suspected to fungal intertrigo. Pt's sister reports pt struggling to mobilize once home and had a fall at home and brought to [facility], the patient was intubated and placed on mechanical life support prior to transfer. 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    Clinical Impression  Alice [name] is 37 y.o. female admitted with above HPI and diagnosis. Patient is currently limited by functional impairments below (see PT problem list). Patient lives with her 3 children and is independent at baseline. She has been limited recently due to declining health and has had assistance from family to care for her home and children. ~1 day PTA on 02/13/22 pt's sister reports she was mobilizing in home however limited due to weakness. Currently pt remains intubated with goal of weaning today therefore EOB not attempted to conserve pt's energy. Bil UE/LE assessed and pt noted to have significant weakness throughout , edema in Lt UE and Lt UE weaker than Rt with Rt LE weaker than Lt. Patient will benefit from continued skilled PT interventions to address impairments and progress independence with mobility.  Anticipate patient will have significant rehab needs when medically ready for discharge, will update recommendations as able/appropriate, recommend SNF rehab at this time. Acute PT will follow and progress as able.       Vent Mode: BIPAP FiO2 (%):  [40 %] 40 % Set Rate:  [15 bmp-30 bmp] 15 bmp Vt Set:  [460 mL] 460 mL PEEP:  [5 cmH20] 5 cmH20 Pressure Support:  [5 cmH20] 5 cmH20 Plateau Pressure:  [24 cmH20] 24 cmH20    Recommendations for follow up therapy are one component of a multi-disciplinary discharge planning process, led by the attending physician.  Recommendations may be updated based on patient status, additional functional criteria and insurance authorization.  Follow Up Recommendations Skilled nursing-short term rehab (<3 hours/day) (TBD pending progress may attempt for AIR if pt progresses tolerance) Can patient physically be transported by private vehicle: No    Assistance Recommended at Discharge Frequent or constant Supervision/Assistance  Patient can return home with the following  Two people to help with walking and/or transfers;Two people to help with bathing/dressing/bathroom;Assistance with cooking/housework;Assistance with feeding;Direct supervision/assist for medications management;Assist for transportation;Help with stairs or ramp for entrance    Equipment Recommendations  (TBD)  Recommendations for Other Services  OT consult    Functional Status Assessment Patient has had a recent decline in their functional status and/or demonstrates limited ability to make significant improvements in function in a reasonable and predictable amount of time     Precautions / Restrictions Precautions Precautions: Fall Precaution Comments: Vent, flexiseal, OG feeding tube, catheter, wounds at panus and Lt UE edema Restrictions Weight Bearing Restrictions: No      Mobility  Bed Mobility  General bed mobility comments: pt Total Assist for bed  repositioning with turn assist from air mattress. Anticipate 3+ assist for EOB activity. plan to attempt to progress to chair position if pt can tolerate activity.    Transfers                        Ambulation/Gait                  Stairs            Wheelchair Mobility    Modified Rankin (Stroke Patients Only)          Pertinent Vitals/Pain Pain Assessment Pain Assessment: CPOT Facial Expression: Relaxed, neutral Body Movements: Absence of movements Muscle Tension: Relaxed Compliance with ventilator (intubated pts.): Tolerating ventilator or movement Vocalization (extubated pts.): N/A CPOT Total: 0 Pain Intervention(s): Limited activity within patient's tolerance, Monitored during session, Repositioned    Home Living Family/patient expects to be discharged to:: Private residence Living Arrangements: Children Available Help at Discharge: Family;Available PRN/intermittently Type of Home: Mobile home (double wide) Home Access: Stairs to enter Entrance Stairs-Rails: None Entrance Stairs-Number of Steps: 3   Home Layout: One level Home Equipment: None Additional Comments: Patient lives with her 3 children, 101 y.o. and 71 y.o. twins. Father of the children assists as well. Pt has required assist from family for home making and gets groceries delivered. was driving. Pt's sister reports pt was ambulating in home PTA on 02/13/22.    Prior Function Prior Level of Function : Independent/Modified Independent (history obtained from pt's sister)                     Hand Dominance   Dominant Hand: Right    Extremity/Trunk Assessment        02/22/22 1130  Upper Extremity Assessment  Upper Extremity Assessment RUE deficits/detail;LUE deficits/detail;Defer to OT evaluation  RUE Deficits / Details Grip:2 Bicep:2- Tricep:2  RUE Sensation WNL  LUE Deficits / Details Grip: 1 Bicep:1 Tricep:2  LUE Sensation WNL  Lower Extremity Assessment  Lower  Extremity Assessment RLE deficits/detail;LLE deficits/detail  RLE Deficits / Details Hip flex:1 Hip ext: 2- Quad:1 DF:1 PF:1. PROM full for limitations of body habitus at knee.  RLE Sensation WNL  LLE Deficits / Details Hip flex: 1 Hip ext: 2- Quad:1 DF:2- PF:2. PROM limited and stiff at knee.  LLE Sensation WNL     Communication   Communication:  (Intubated)  Cognition Arousal/Alertness: Awake/alert Behavior During Therapy: Flat affect Overall Cognitive Status: Difficult to assess                                          General Comments      Exercises     Assessment/Plan    PT Assessment Patient needs continued PT services  PT Problem List         PT Treatment Interventions DME instruction;Gait training;Stair training;Functional mobility training;Therapeutic activities;Therapeutic exercise;Balance training;Neuromuscular re-education;Patient/family education    PT Goals (Current goals can be found in the Care Plan section)  Acute Rehab PT Goals PT Goal Formulation: With family Time For Goal Achievement: 03/08/22 Potential to Achieve Goals: Fair    Frequency Min 3X/week     Co-evaluation               AM-PAC PT "6 Clicks" Mobility  Outcome Measure Help needed  turning from your back to your side while in a flat bed without using bedrails?: Total Help needed moving from lying on your back to sitting on the side of a flat bed without using bedrails?: Total Help needed moving to and from a bed to a chair (including a wheelchair)?: Total Help needed standing up from a chair using your arms (e.g., wheelchair or bedside chair)?: Total Help needed to walk in hospital room?: Total Help needed climbing 3-5 steps with a railing? : Total 6 Click Score: 6    End of Session Equipment Utilized During Treatment: Oxygen (Vent) Activity Tolerance: Patient tolerated treatment well;Treatment limited secondary to medical complications (Comment) Patient left:  in bed;with call bell/phone within reach;with nursing/sitter in room Nurse Communication: Mobility status      Time: 5427-0623 PT Time Calculation (min) (ACUTE ONLY): 15 min   Charges:   PT Evaluation $PT Eval High Complexity: 1 High          Wynn Maudlin, DPT Acute Rehabilitation Services Office [phone]  02/22/22 12:32 PM

## 2022-02-22 NOTE — Progress Notes (Signed)
PCCM INTERVAL PROGRESS NOTE  Failed bedside swallow. Will ask SLP to evaluate tomorrow. Will need to hold zyprexa tonight, but she has responded well to it, so if she should get agitated tonight we could consider an IM dose. If she can't swallow well enough tomorrow to take medications and meet nutritional needs she will need small bore feeding tube placed.    Georgann Housekeeper, AGACNP-BC [facility]  See Amion for personal pager PCCM on call pager [phone] until 7pm. Please call Elink 7p-7a. [phone]  02/22/2022 6:52 PM

## 2022-02-22 NOTE — Progress Notes (Signed)
Arterial blood gas sample obtained and sent to lab.  Patient vent settings for ABG are PS=5 cm H2O, PEEP=5 cm H2O, FiO2=40%.

## 2022-02-22 NOTE — Progress Notes (Signed)
eLink Physician-Brief Progress Note Patient Name: Alice [name] DOB: January 19, 1986 MRN: [id number]   Date of Service  02/22/2022  HPI/Events of Note  Fever to 102.1 F - AST and ALT both normal. Nursing states that patient can only take IV medications d/t NPO and no feeding tube and persistent diarrhea. Patient is already on Zyvox and Zosyn. Reluctant to order Tylenol IV due to obscene cost.   eICU Interventions  Plan: Ice packs and cooling blanket PRN.      Intervention Category Major Interventions: Other:  Lysle Dingwall 02/22/2022, 7:52 PM

## 2022-02-22 NOTE — Plan of Care (Signed)
Patient more alert than few days ago when this RN last provided care for patient, but she continues to need sedation support to tolerate ventilator.   Problem: Education: Goal: Knowledge of General Education information will improve Description: Including pain rating scale, medication(s)/side effects and non-pharmacologic comfort measures Outcome: Progressing   Problem: Health Behavior/Discharge Planning: Goal: Ability to manage health-related needs will improve Outcome: Progressing   Problem: Clinical Measurements: Goal: Ability to maintain clinical measurements within normal limits will improve Outcome: Progressing Goal: Will remain free from infection Outcome: Progressing Goal: Diagnostic test results will improve Outcome: Progressing Goal: Respiratory complications will improve Outcome: Progressing Goal: Cardiovascular complication will be avoided Outcome: Progressing   Problem: Activity: Goal: Risk for activity intolerance will decrease Outcome: Progressing   Problem: Nutrition: Goal: Adequate nutrition will be maintained Outcome: Progressing   Problem: Coping: Goal: Level of anxiety will decrease Outcome: Progressing   Problem: Elimination: Goal: Will not experience complications related to bowel motility Outcome: Progressing Goal: Will not experience complications related to urinary retention Outcome: Progressing   Problem: Pain Managment: Goal: General experience of comfort will improve Outcome: Progressing   Problem: Safety: Goal: Ability to remain free from injury will improve Outcome: Progressing   Problem: Skin Integrity: Goal: Risk for impaired skin integrity will decrease Outcome: Progressing   Problem: Activity: Goal: Ability to tolerate increased activity will improve Outcome: Progressing   Problem: Respiratory: Goal: Ability to maintain a clear airway and adequate ventilation will improve Outcome: Progressing   Problem: Role  Relationship: Goal: Method of communication will improve Outcome: Progressing

## 2022-02-22 NOTE — Procedures (Signed)
Extubation Procedure Note  Patient Details:   Name: Alice [name] DOB: [birth year] MRN: [id number]   Airway Documentation:    Vent end date: 02/22/22 Vent end time: 1400   Evaluation  O2 sats: stable throughout Complications: No apparent complications Patient did tolerate procedure well. Bilateral Breath Sounds: Clear, Diminished   Yes, placed pt on BIPAP per MD order  Gonzella Lex 02/22/2022, 2:11 PM

## 2022-02-22 NOTE — Progress Notes (Signed)
Nutrition Follow-up  DOCUMENTATION CODES:   Morbid obesity  INTERVENTION:  - Increase tube feeds via OGT to goal as below: Vital 1.5 at 60 ml/h (1440 ml per day) *Start at 59mL and advance by 76mL Q6H to goal Prosource TF20 60 ml BID Provides 2320 kcal, 137 gm protein, 1100 ml free water daily  - Free water flushes 217mL Q4H + 111mL free water from TF = 2344mL/day   - Monitor magnesium, potassium, and phosphorus, MD to replete as needed.   - Monitor weight trends.    NUTRITION DIAGNOSIS:   Inadequate oral intake related to inability to eat as evidenced by NPO status (on vent). *ongoing  GOAL:   Patient will meet greater than or equal to 90% of their needs *progressing, increasing tube feeds today  MONITOR:   Vent status, Labs, TF tolerance  REASON FOR ASSESSMENT:   Consult Enteral/tube feeding initiation and management (trickle tube feed)  ASSESSMENT:   37 y.o. 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  12/31: Admit 1/1: Intubated 1/2 Started Pivot 1.5 at 12mL/hr 1/5 TF @ 2mL/hr, plan to maintain current rate and assess K+ trends 1/9 Increase towards goal of 39mL/hr  Patient remains intubated, hopeful extubation over the next few days.  Patient has remained at 57mL/hr, tolerating tube feeds well.  Patient on FWF of 2108mL Q4H due to hypernatremia.   Per discussion with CCM NP, can increase TF towards goal today.    Medications reviewed and include: Colace, Free water flushes 231mL Q4H, Miralax, K+ replacements daily  Labs reviewed:  Na 147    Diet Order:   Diet Order             Diet NPO time specified  Diet effective now                   EDUCATION NEEDS:  Not appropriate for education at this time  Skin:  Skin  Assessment: Reviewed RN Assessment Skin Integrity Issues:: Other (Comment) Other: (ITD) Intertriginous Dermatitis on Abdomen  Last BM:  1/8 - rectal tube  Height:  Ht Readings from Last 1 Encounters:  02/17/22 5\' 9"  (1.753 m)   Weight:  Wt Readings from Last 1 Encounters:  02/22/22 (!) 147.4 kg   Ideal Body Weight:  65.91 kg  BMI:  Body mass index is 47.99 kg/m.  Estimated Nutritional Needs:  Kcal:  2100-2550 kcals Protein:  130-145 grams Fluid:  >/= 2.1L    Samson Frederic RD, LDN For contact information, refer to [facility].

## 2022-02-23 DIAGNOSIS — J189 Pneumonia, unspecified organism: Secondary | ICD-10-CM | POA: Diagnosis not present

## 2022-02-23 LAB — CBC
HCT: 25.2 % — ABNORMAL LOW (ref 36.0–46.0)
Hemoglobin: 8 g/dL — ABNORMAL LOW (ref 12.0–15.0)
MCH: 30.8 pg (ref 26.0–34.0)
MCHC: 31.7 g/dL (ref 30.0–36.0)
MCV: 96.9 fL (ref 80.0–100.0)
Platelets: 326 10*3/uL (ref 150–400)
RBC: 2.6 MIL/uL — ABNORMAL LOW (ref 3.87–5.11)
RDW: 17.1 % — ABNORMAL HIGH (ref 11.5–15.5)
WBC: 18.7 10*3/uL — ABNORMAL HIGH (ref 4.0–10.5)
nRBC: 0.4 % — ABNORMAL HIGH (ref 0.0–0.2)

## 2022-02-23 LAB — GLUCOSE, CAPILLARY
Glucose-Capillary: 110 mg/dL — ABNORMAL HIGH (ref 70–99)
Glucose-Capillary: 105 mg/dL — ABNORMAL HIGH (ref 70–99)

## 2022-02-23 LAB — PHOSPHORUS: Phosphorus: 3.7 mg/dL (ref 2.5–4.6)

## 2022-02-23 LAB — BASIC METABOLIC PANEL
Anion gap: 7 (ref 5–15)
BUN: 30 mg/dL — ABNORMAL HIGH (ref 6–20)
CO2: 19 mmol/L — ABNORMAL LOW (ref 22–32)
Calcium: 8.2 mg/dL — ABNORMAL LOW (ref 8.9–10.3)
Chloride: 125 mmol/L — ABNORMAL HIGH (ref 98–111)
Creatinine, Ser: 0.72 mg/dL (ref 0.44–1.00)
GFR, Estimated: >60 mL/min (ref >60)
Glucose, Bld: 108 mg/dL — ABNORMAL HIGH (ref 70–99)
Potassium: 4.2 mmol/L (ref 3.5–5.1)
Sodium: 151 mmol/L — ABNORMAL HIGH (ref 135–145)

## 2022-02-23 LAB — MAGNESIUM: Magnesium: 1.5 mg/dL — ABNORMAL LOW (ref 1.7–2.4)

## 2022-02-23 LAB — PROCALCITONIN: Procalcitonin: <0.1 ng/mL

## 2022-02-23 MED ORDER — SODIUM CHLORIDE 0.9 % IV SOLN
1.0000 g | Freq: Three times a day (TID) | INTRAVENOUS | Status: AC
  Administered 2022-02-23 – 2022-02-28 (×15): 1 g via INTRAVENOUS
  Filled 2022-02-23 (×16): qty 20

## 2022-02-23 MED ORDER — DEXTROSE 5 % IV SOLN: INTRAVENOUS | Status: DC

## 2022-02-23 MED ORDER — HYDRALAZINE HCL 20 MG/ML IJ SOLN
10.0000 mg | INTRAMUSCULAR | Status: DC | PRN
  Filled 2022-02-23 (×2): qty 1

## 2022-02-23 MED ORDER — MAGNESIUM SULFATE 4 GM/100ML IV SOLN
4.0000 g | Freq: Once | INTRAVENOUS | Status: AC
  Administered 2022-02-23: 4 g via INTRAVENOUS
  Filled 2022-02-23: qty 100

## 2022-02-23 MED ORDER — FREE WATER: 200.0000 mL | Status: DC

## 2022-02-23 NOTE — Progress Notes (Signed)
Pharmacy Antibiotic Note  Alice [name] is a 37 y.o. female admitted on 02/13/2022 with n/v/d and influenza. She was intubated from 1/1 to 1/9 and on various abx for PNA.  With persistent fever while on zosyn and d/t concern for ESBL, pharmacy has been consulted on 1/10 to start meropenem for sepsis/UTI.  Today, 02/23/2022: - day #10 abx - Tmax 102.1, wbc 18.7 - scr 0.72 - PCT <0.10   Plan: - meropenem 1gm IV q8h  __________________________________  Height: 5\' 9"  (175.3 cm) Weight: (!) 147.7 kg (325 lb 9.9 oz) IBW/kg (Calculated) : 66.2  Temp (24hrs), Avg:100.1 F (37.8 C), Min:98.4 F (36.9 C), Max:102.1 F (38.9 C)  Recent Labs  Lab 02/19/22 0555 02/20/22 0500 02/21/22 0348 02/21/22 1534 02/22/22 0252 02/23/22 0319 02/23/22 0830  WBC 19.7* 22.1* 22.4*  --  22.7*  --  18.7*  CREATININE 1.68* 1.29* 1.18* 0.96 0.93 0.72  --     Estimated Creatinine Clearance: 150.2 mL/min (by C-G formula based on SCr of 0.72 mg/dL).    Allergies  Allergen Reactions   Cinnamon Anaphylaxis    Cinnamon flavoring    Other Swelling    Artificial cinnamon causes tongue to swell   Reglan [Metoclopramide] Other (See Comments)    Tactile disturbances REACTION: causes skin to "crawl"   Cinnamon Flavor Swelling    Artificial cinnamon causes tongue to swell   Codeine Nausea Only    Straight codeine only Percocet & vicodin are tolerated by pt.   Flomax [Tamsulosin] Other (See Comments)    Other reaction(s): Other (See Comments) Oral sores Oral sores   Ibuprofen Other (See Comments)    Stomach pain Causes severe pain in stomach Pt can tolerate if used sparingly   Levofloxacin Hives   Tylenol [Acetaminophen] Other (See Comments)    Pt reports Tylenol causes her kidney to bleed.    1/2 tamiflu >> 1/7 12/31 cefepime>> 1/7 12/31 flagyl>> 1/2 12/31 Vanc x1 1/2 zyvox>>1/8 1/7 zosyn >>1/10 1/10 meropenem>>   12/31 BCx: NGF 1/1 MRSA PCR + 1/7 TA: few yeast , rare candida  albicans FINAL  1/10 ucx:   Thank you for allowing pharmacy to be a part of this patient's care.  Lynelle Doctor 02/23/2022 9:58 AM

## 2022-02-23 NOTE — Progress Notes (Addendum)
eLink Physician-Brief Progress Note Patient Name: Alice [name] DOB: 02-09-1986 MRN: [id number]   Date of Service  02/23/2022  HPI/Events of Note  Na+ = 151 and CO2 = 19. Elevated Na+ likely d/t insensible water loss with fever and +/- NaHCO3 IV infusion. Nursing reports that patient is not able to take free water PO. Enteric tube D/Ced when extubated yesterday.   eICU Interventions  Plan: Decrease NaHCO3 IV infusion to 25 mL/hour.     Intervention Category Major Interventions: Electrolyte abnormality - evaluation and management  Stephnie Parlier Eugene 02/23/2022, 4:59 AM

## 2022-02-23 NOTE — Progress Notes (Signed)
NAME:  Alice [name], MRN:  [id number], DOB:  1985-02-29, LOS: 10 ADMISSION DATE:  02/13/2022, CONSULTATION DATE: 02/14/2022 REFERRING MD: Dr. Arbutus Leas, CHIEF COMPLAINT: Respiratory failure  History of Present Illness:   This is a 37 year old female, morbidly obese, BMI 54, history of fatty liver, gastroesophageal reflux history of kidney stones, chronic left flank pain.  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 [name] at [facility].  Patient presented to the emergency department with nausea vomiting diarrhea and a home flu test that was positive.  She was recently admitted to [facility] on 12/10 through 12/16 for a nongap metabolic acidosis and ulcerative lesions on the inner thighs suspected to fungal intertrigo.  Dermatology that saw her there did not feel the patient had pyoderma gangrenosum.  She was treated with fluconazole and discharged with 7 more days of fluconazole.  Recommended wound care with Aquacel Ag.  She was admitted previously at [facility] on 12/11/2021 through 12/30/2021 with cellulitis and Candida intertrigo and pneumonia with sepsis treated with IV antibiotics discharged on a 10-day door course of doxycycline.  She was seen in the emergency department her core temperature dropped to 94 she remained tachycardic and tachypneic.  Sodium was 133 BUN of 20, creatinine of 2.25 most recent in our system was within normal range.  Pregnancy test negative.  Lactic acidosis of 3.2.  She is found to be influenza B positive, influenza A, COVID and RSV negative.  Patient's arterial blood gas showed a pH of 7.05, pCO2 of 30, PaO2 of 81, bicarb of 8.  She has a potassium of 3.0, leukocytosis of 18,000.  The patient was intubated and placed on mechanical life support prior to transfer.  Chest x-ray with bilateral interstitial infiltrates.  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 [name] at [facility]   Morbid obesity [facility])    Preterm labor    Round ligament pain 09/10/2012   Torn ACL      Significant Hospital Events: Including procedures, antibiotic start and stop dates in addition to other pertinent events   1/1 admitted with severe ARDS, influenza pneumonia. Proned with marked improvement in P: F ratio 1/2 supinated, O2 better, ventilation better, renal function stable but with AKI 1/5 remains on the ventilator, sedated.  Repeating potassium.  Off pressors 1/7 Fever/leukocytosis, started on empric abx with unclear source.  1/9 following more commands, intermittent agitation, extubated, failed SLP, intermittent BiPAP  Interim History / Subjective:  Tmax 102.1 Didn't sleep much over night.  Wore BiPAP but did not like it. Failed bedside swallow  Objective   Blood pressure 130/66, pulse 84, temperature 100.3 F (37.9 C), temperature source Axillary, resp. rate 18, height 5\' 9"  (1.753 m), weight (!) 147.7 kg, SpO2 95 %.    Vent Mode: BIPAP;PCV FiO2 (%):  [30 %-40 %] 30 % Set Rate:  [14 bmp-15 bmp] 14 bmp PEEP:  [5 cmH20-6 cmH20] 6 cmH20 Pressure Support:  [5 cmH20] 5 cmH20   Intake/Output Summary (Last 24 hours) at 02/23/2022  0838 Last data filed at 02/23/2022 1017 Gross per 24 hour  Intake 1377.99 ml  Output 2650 ml  Net -1272.01 ml   Filed Weights   02/21/22 0500 02/22/22 0352 02/23/22 0500  Weight: (!) 149.7 kg (!) 147.4 kg (!) 147.7 kg    Examination:  General:  Morbidly obese adult female sitting upright in bed in NAD HEENT: MM pink/moist, pupils 4/reactive, anicteric  Neuro: Awake, oriented x 3, soft phonation, MAE weakly CV: rr, NSR, no murmur PULM:   non labored, RA, few scattered rhonchi, diminished GI: obese, +bs, soft, NT, foley, several dressings to lower pannus/ inner thighs, no significant erythema, foley Extremities: warm/dry, no LE edema, left hand with mild edema and dressing in place cdi  UOP 2.4L -1.2L/ 24hrs Net +541 ml  Stool 234ml/ 24hrs  CBG 105- 146 Labs> Na 151, K 4.2, Cl 125, bicarb 19, sCr 0.72, mag 1.5, PCT < 0.1, no CBC today, ESR 85 yest, neg CRP  1/7 trach asp> rare candida albicans   Resolved Hospital Problem list   Septic shock ARDS AKI  Assessment & Plan:   Acute hypoxemic respiratory failure secondary to influenza B pneumonia Possible concomitant bacterial pneumonia - tamiflu completed 1/7 - cont aggressive pulmonary hygiene> IS, flutter, mobilize with PT - SLP today, if fails, will need dobhoff placed till able to take POs - currently on RA, supplemental O2 prn for sat goal > 92% - BiPAP not needed, d/c - abx as below   Sepsis, suspected UTI - continues to have Leukocytosis (improving) and fever up to 102 despite neg PCT - trach aspirate with rare candida albicans - send UC, UA since admit with large leukocytes and WBC> 50, possible ESBL?Marland Kitchen  Previous e.coli on prior UC.  Will stop zosyn and start meropenum per pharmacy for  now.   - cont foley for now for wound care - cont to trend WBC/ fever curve   Acute Renal Injury, resolved  Metabolic acidosis due to renal failure + nongap due to diarrhea, improving Hypernatremia  - stop bicarb - if fails SLP, will need dobhoff and start free water vs D5W - stool output much improved  - Trend BMP / urinary output - Replace electrolytes as indicated - Avoid nephrotoxic agents, ensure adequate renal perfusion  Chronic Panniculitis  Open wound on abdomen  Chronic fungal intertrigo  Wounds look stable.  We will continue to monitor. - WOC, appreciate recs  - Dressing changes PRN  - CT didn't show any soft tissue infection on admission.    Hyperammonemia  1/1 RUQ [place] with hepatic steatosis Limited evaluation due to habitus Repeat ammonia levels is 42.   - cont to hold lactulose given significant diarrhea/ GI loss - repeat ammonia in am   Hypokalemia, hypophosphatemia, hypomag  - K and phos wnl today - replete Mag for goal > 2 - replete electrolytes prn   Malnutrition Likely initially deficient even though she has obesity - NPO pending SLP.  If fails will place dobhoff and RD consult  Possible ibuprofen toxicity.   - Sister reports finding multiple empty bottles of ibuprofen at home.> will need to clarify this with sister/ patient> consider IP psych eval - supportive care  Delirium, improving She is on prozac, gaba, olanzapine, suboxone, trazodone chronically - holding all while NPO.  At risk for w/d.     HypoMag - s/p Mag replete.  Recheck in am   Best Practice (right click and "Reselect all SmartList Selections" daily)   Diet/type: NPO  DVT prophylaxis: LMWH GI prophylaxis: PPI Lines: N/A Foley:  Yes, and it is still needed Code Status:  full code Last date of multidisciplinary goals of care discussion [.]  Pending 1/10.  Patient updated on plan of care  Critical care time: n/a    Amanda Cockayne  Pulmonary & Critical Care 02/23/2022, 8:38 AM  See Amion for pager If no response to pager, please call PCCM consult pager After 7:00 pm call Elink

## 2022-02-23 NOTE — Progress Notes (Signed)
Rt gave pt flutter valve. Pt knows and understands how to use. Pt has poor effort at doing.

## 2022-02-23 NOTE — Progress Notes (Signed)
[facility] ADULT ICU REPLACEMENT PROTOCOL   The patient does apply for the [facility] Adult ICU Electrolyte Replacment Protocol based on the criteria listed below:   1.Exclusion criteria: TCTS, ECMO, Dialysis, and Myasthenia Gravis patients 2. Is GFR >/= 30 ml/min? Yes.    Patient's GFR today is >60 3. Is SCr </= 2? Yes.   Patient's SCr is 0.72 mg/dL 4. Did SCr increase >/= 0.5 in 24 hours? No. 5.Pt's weight >40kg  Yes.   6. Abnormal electrolyte(s): mag 1.5  7. Electrolytes replaced per protocol 8.  Call MD STAT for K+ </= 2.5, Phos </= 1, or Mag </= 1 Physician:    Ronda Fairly A 02/23/2022 4:38 AM

## 2022-02-23 NOTE — Evaluation (Signed)
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 Amalia Hailey at [place] [facility])    Preterm labor    Round ligament pain 09/10/2012   Torn ACL    Past Surgical History:  Past Surgical History:  Procedure Laterality Date   addenoidectomy     CESAREAN SECTION N/A 02/22/2013   Procedure: CESAREAN SECTION/TWINS;  Surgeon: Florian Buff, MD;  Location: [facility];  Service: Obstetrics;  Laterality: N/A;   CHOLECYSTECTOMY     GALLBLADDER SURGERY  2019   NASAL SINUS SURGERY     NEPHRECTOMY     right side   OTHER SURGICAL HISTORY     ulner nerve removal   TONSILLECTOMY     HPI: 37 year old female, morbidly obese, BMI 54, history of fatty liver, gastroesophageal reflux history of kidney stones, chronic left flank pain. 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 Amalia Hailey at [place] admited with ARDS - required intubation 1/1-02/22/2022. She failed swallow screen - has no feeding tube at this time. Pt has had several hospital admits over the last few months. Pt reports she does not want a feeding tube placed through her nose.       Assessment / Plan / Recommendation  Clinical Impression  Pt presents with clinical indications concerning for pharyngeal dysphagia c/b mulitple swallows and weak cough across all pos.   These clinical  indications were much more pronounced with thin water.  Pt appears with significant edema of uvula and ? Abrasion on right upper hard palate = suspect due to intubation.  Congested breathing heard at larynx worsened with intake and inhalation prompted overt cough.  Despite max cues, pt unable to expel secretions into  with neither cough nor "hock".  At this time, recommend single ice chips and necessary medication with nectar crushed.  Will follow up next date for readiness for po.  Pt admits to h/o coughing with soda, etc PTA - suspect acute dysphagia due to prolonged intubation that will abate with time.  Educated pt and her boyfriend to recommendations, spoke to RN and CCM who all agree with plan.  Left signs with RN for room.  Advised pt and her boyfriend to recommendations to strengthen her cough, voice and expectoration ability.  Will follow up next date for po readiness and further diagnostic/treatment. SLP Visit Diagnosis: Dysphagia, pharyngeal phase (R13.13);Dysphagia, pharyngoesophageal phase (R13.14)    Aspiration Risk  Severe aspiration risk;Risk for inadequate nutrition/hydration    Diet Recommendation NPO;Ice chips PRN after oral care (needed medications with nectar)   Medication Administration: Other (Comment) (with nectar) Supervision: Full supervision/cueing for compensatory strategies Compensations: Slow rate;Small sips/bites Postural Changes: Seated upright at 90 degrees;Remain upright for at least 30 minutes after po intake    Other  Recommendations Recommended  Consults: Consider esophageal assessment Oral Care Recommendations: Oral care prior to ice chip/H20    Recommendations for follow up therapy are one component of a multi-disciplinary discharge planning process, led by the attending physician.  Recommendations may be updated based on patient status, additional functional criteria and insurance authorization.  Follow up Recommendations Skilled nursing-short term rehab (<3  hours/day)      Assistance Recommended at Discharge    Functional Status Assessment Patient has had a recent decline in their functional status and/or demonstrates limited ability to make significant improvements in function in a reasonable and predictable amount of time  Frequency and Duration min 1 x/week  1 week       Prognosis Prognosis for Safe Diet Advancement: Guarded      Swallow Study   General   37 year old female, morbidly obese, BMI 54, history of fatty liver, gastroesophageal reflux history of kidney stones, chronic left flank pain. 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 Amalia Hailey at [place] admited with ARDS - required intubation 1/1-02/22/2022. She failed swallow screen - has no feeding tube at this time. Pt has had several hospital admits over the last few months. Pt reports she does not want a feeding tube placed through her nose.    Oral/Motor/Sensory Function Overall Oral Motor/Sensory Function: Generalized oral weakness (edematous uvula, generalized weakness, aphonia)   Ice Chips Ice chips: Impaired Presentation: Spoon Pharyngeal Phase Impairments: Suspected delayed Swallow;Multiple swallows;Cough - Delayed   Thin Liquid Thin Liquid: Impaired Presentation: Spoon Pharyngeal  Phase Impairments: Suspected delayed Swallow;Multiple swallows;Cough - Delayed    Nectar Thick Nectar Thick Liquid: Impaired Presentation: Cup;Spoon Pharyngeal Phase Impairments: Suspected delayed Swallow;Multiple swallows;Cough - Delayed   Honey Thick Honey Thick Liquid: Not tested   Puree Puree: Impaired Presentation: Spoon Pharyngeal Phase Impairments: Multiple swallows;Cough - Delayed   Solid     Solid: Not tested      Macario Golds 02/23/2022,3:42 PM Kathleen Lime, MS [facility] SLP Acute Rehab Services Office [phone] Pager [phone]

## 2022-02-23 NOTE — Progress Notes (Signed)
BSE completed,  full report to follow.  Pt presents with clinical indications concerning for pharyngeal dysphagia c/b mulitple swallows and weak cough across all pos.   These clinical indications were much more pronounced with thin water.  Pt appears with significant edema of uvula and ? Abrasion on right upper hard palate = suspect due to intubation.  Congested breathing heard at larynx worsened with intake and inhalation prompted overt cough.  Despite max cues, pt unable to expel secretions into  with neither cough nor "hock".  At this time, recommend single ice chips and necessary medication with nectar crushed.  Will follow up next date for readiness for po.  Pt admits to h/o coughing with soda, etc PTA - suspect acute dysphagia due to prolonged intubation that will abate with time.  Educated pt and her boyfriend to recommendations, spoke to RN and CCM who all agree with plan.  Left signs with RN for room.    Kathleen Lime, MS [facility] SLP Acute Rehab Services Office [phone] Pager [phone]

## 2022-02-24 ENCOUNTER — Inpatient Hospital Stay (HOSPITAL_COMMUNITY): Payer: Medicaid Other

## 2022-02-24 DIAGNOSIS — S31109D Unspecified open wound of abdominal wall, unspecified quadrant without penetration into peritoneal cavity, subsequent encounter: Secondary | ICD-10-CM

## 2022-02-24 DIAGNOSIS — J189 Pneumonia, unspecified organism: Secondary | ICD-10-CM | POA: Diagnosis not present

## 2022-02-24 DIAGNOSIS — R6889 Other general symptoms and signs: Secondary | ICD-10-CM | POA: Diagnosis not present

## 2022-02-24 DIAGNOSIS — R112 Nausea with vomiting, unspecified: Secondary | ICD-10-CM | POA: Diagnosis not present

## 2022-02-24 DIAGNOSIS — R1084 Generalized abdominal pain: Secondary | ICD-10-CM

## 2022-02-24 DIAGNOSIS — N179 Acute kidney failure, unspecified: Secondary | ICD-10-CM | POA: Diagnosis not present

## 2022-02-24 LAB — BASIC METABOLIC PANEL
Anion gap: 4 — ABNORMAL LOW (ref 5–15)
BUN: 21 mg/dL — ABNORMAL HIGH (ref 6–20)
CO2: 19 mmol/L — ABNORMAL LOW (ref 22–32)
Calcium: 8.2 mg/dL — ABNORMAL LOW (ref 8.9–10.3)
Chloride: 127 mmol/L — ABNORMAL HIGH (ref 98–111)
Creatinine, Ser: 0.71 mg/dL (ref 0.44–1.00)
GFR, Estimated: >60 mL/min (ref >60)
Glucose, Bld: 117 mg/dL — ABNORMAL HIGH (ref 70–99)
Potassium: 3.4 mmol/L — ABNORMAL LOW (ref 3.5–5.1)
Sodium: 150 mmol/L — ABNORMAL HIGH (ref 135–145)

## 2022-02-24 LAB — CBC
HCT: 25.5 % — ABNORMAL LOW (ref 36.0–46.0)
Hemoglobin: 7.9 g/dL — ABNORMAL LOW (ref 12.0–15.0)
MCH: 30.4 pg (ref 26.0–34.0)
MCHC: 31 g/dL (ref 30.0–36.0)
MCV: 98.1 fL (ref 80.0–100.0)
Platelets: 357 10*3/uL (ref 150–400)
RBC: 2.6 MIL/uL — ABNORMAL LOW (ref 3.87–5.11)
RDW: 17.5 % — ABNORMAL HIGH (ref 11.5–15.5)
WBC: 15.2 10*3/uL — ABNORMAL HIGH (ref 4.0–10.5)
nRBC: 0.3 % — ABNORMAL HIGH (ref 0.0–0.2)

## 2022-02-24 LAB — MAGNESIUM: Magnesium: 2.1 mg/dL (ref 1.7–2.4)

## 2022-02-24 LAB — URINE CULTURE: Culture: NO GROWTH

## 2022-02-24 LAB — AMMONIA: Ammonia: 19 umol/L (ref 9–35)

## 2022-02-24 MED ORDER — POTASSIUM CHLORIDE 10 MEQ/100ML IV SOLN
INTRAVENOUS | Status: AC
  Filled 2022-02-24: qty 100

## 2022-02-24 MED ORDER — POTASSIUM CHLORIDE 10 MEQ/100ML IV SOLN
10.0000 meq | INTRAVENOUS | Status: AC
  Administered 2022-02-24 (×3): 10 meq via INTRAVENOUS
  Filled 2022-02-24 (×2): qty 100

## 2022-02-24 NOTE — Progress Notes (Addendum)
Physical Therapy Treatment Patient Details Name: Alice [name] MRN: [id number] DOB: 06-26-1985 Today's Date: 02/24/2022   History of Present Illness Patient is a 37 y.o. female presented to the emergency department with nausea vomiting diarrhea and a home flu test that was positive. Pt with multiple recent hospitalizations at outside hospitals. Pt at admitted to [facility] 12/11/21-12/30/21 with cellulitis and Candida intertrigo and pneumonia with sepsis treated with IV antibiotics discharged on a 10-day door course of doxycycline.Then pt admitted to [facility] from 89/37-34/28 for metabolic acidosis and ulcerative lesions on the inner thighs suspected to fungal intertrigo. Pt's sister reports pt struggling to mobilize once home and had a fall at home and brought to [facility], the patient was intubated and placed on mechanical life support prior to transfer. 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    PT Comments    Pt AxO x 2 following directions and able to express her needs.  Pt present with a soft/weak voice.  Significant other present during session.  Helping her with ICE chips.  Pt scheduled for a MBBS later. Pt currently in a [place] specialty bed which impedes her ability to move.  The air mattress is sunken in the middle under her weight thus causes hyper inflation on both lateral sides similar to a "hammock".  Pt is "wedged" making it VERY difficult to perform any bed mobility (side to side rolling).  Plus pt is profoundly weak and present with EDEMA THROUGHOUT.  Pt unable to raise her UE past a few inches and totally unable to move either B LE.  Those are "wedged" in the air mattress as well.  She required Total Assist to roll pt to get MAXI MOVE pad under her.  Assisted OOB was difficult and required MAXI MOVE lift. Assisted to [facility].  Attempted sit to stand however pt was too weak and too EDEMATOUS to even partially complete.  Pt was only able to perform upper body forward weight shift x 5 reps.  Performed a few B LE TE's but pt unable to lift her legs against gravity.  AAROM 5 reps LAQ's and knee ups.  Positioned pt in recliner to comfort. Discussed with RN, D/C specialty bed for a regular bed to progress her mobility  Pt will need ST Rehab at SNF to address mobility and functional decline prior to safely returning home.   Recommendations for follow up therapy are one component of a multi-disciplinary discharge planning process, led by the attending physician.  Recommendations may be updated based on patient status, additional functional criteria and insurance authorization.  Follow Up Recommendations  Skilled nursing-short term rehab (<3 hours/day) Can patient physically be transported by private vehicle: No   Assistance Recommended at Discharge Frequent or constant Supervision/Assistance  Patient can return home with the following Two people to help with walking and/or transfers;Two people to help with bathing/dressing/bathroom;Assistance with cooking/housework;Assistance with feeding;Direct supervision/assist for medications management;Assist for transportation;Help with stairs or ramp for entrance   Equipment Recommendations       Recommendations for Other Services OT consult     Precautions / Restrictions Precautions Precautions: Fall Precaution Comments: flexiseal, catheter, wounds at panus and EDEMA throughout Restrictions Weight Bearing Restrictions: No     Mobility  Bed Mobility Overal bed mobility: Needs Assistance Bed Mobility: Rolling Rolling: Total assist, +2 for physical assistance, +2  for safety/equipment         General bed mobility comments: Total Assist + 2 (pt<5%) rolling side to side to place Maxi Move Pad under pt    Transfers Overall transfer level: Needs assistance    Transfers: Bed to chair/wheelchair/BSC             General transfer comment: used Maxi Move LIFT to transfer pt to recliner.  Once in recliner, attempted sit to stand however pt unable only achieving upper body forward leaning.  Great difficulty moving B UE's to attempt "push off" due to profound weakness and EDEMA throughout.  L UE/hand such that she was unable to grip. Transfer via Lift Equipment: Maximove  Ambulation/Gait               General Gait Details: non amb at this time   Social research officer, government Rankin (Stroke Patients Only)       Balance                                            Cognition Arousal/Alertness: Awake/alert Behavior During Therapy: Flat affect Overall Cognitive Status: Within Functional Limits for tasks assessed                                 General Comments: Pt AxO x 2 following directions and able to express needs.  Pt tends to "wisper" weak voice.        Exercises      General Comments        Pertinent Vitals/Pain Pain Assessment Pain Assessment: No/denies pain    Home Living Family/patient expects to be discharged to:: Private residence Living Arrangements: Children Available Help at Discharge: Family;Available PRN/intermittently Type of Home: Mobile home (double wide) Home Access: Stairs to enter Entrance Stairs-Rails: None Entrance Stairs-Number of Steps: 3   Home Layout: One level Home Equipment: None Additional Comments: Patient lives with her 3 children, 49 y.o. and 25 y.o. twins. Father of the children assists as well. Pt has required assist from family for home making and gets groceries delivered. was driving. Pt's sister reports pt was ambulating in home PTA on 02/13/22.    Prior Function            PT Goals (current goals can now be found in the care plan section) Progress towards PT goals: Progressing toward goals    Frequency    Min  3X/week      PT Plan Current plan remains appropriate    Co-evaluation              AM-PAC PT "6 Clicks" Mobility   Outcome Measure  Help needed turning from your back to your side while in a flat bed without using bedrails?: Total Help needed moving from lying on your back to sitting on the side of a flat bed without using bedrails?: Total Help needed moving to and from a bed to a chair (including a wheelchair)?: Total Help needed standing up from a chair using your arms (e.g., wheelchair or bedside chair)?: Total Help needed to walk in hospital room?: Total Help needed climbing 3-5 steps with a railing? : Total 6 Click Score: 6    End of Session   Activity  Tolerance: Patient limited by fatigue;Treatment limited secondary to medical complications (Comment) Patient left: in chair;with family/visitor present;with call bell/phone within reach   PT Visit Diagnosis: Unsteadiness on feet (R26.81);Other abnormalities of gait and mobility (R26.89);Muscle weakness (generalized) (M62.81)     Time: 4270-6237 PT Time Calculation (min) (ACUTE ONLY): 28 min  Charges:  $Therapeutic Activity: 23-37 mins                     Rica Koyanagi  PTA Acute  Rehabilitation Services Office M-F          [phone] Weekend pager [phone]

## 2022-02-24 NOTE — Progress Notes (Addendum)
MBS completed, full report to follow.  Pt presents with mild oral and moderate pharyngeal dysphagia likely due to edema from intubation impacting pharyngeal motility and laryngeal closure allowing aspiration of thin and nectar consistencies during and after swallow. Mild amount of aspiration with thin and nectar liquid as well as secretions were not sensed and cued cough was NOT effective to clear.    Pt did reflexively cough *without clearance of airway* with large amounts of aspiration deep into trachea.  Chin tuck posture did not improve airway protection.  Mild pharyngeal retention noted across all boluses - likely due to edema.    Honey thick, puree nor solids were aspirated but retention is present without pt sensation.  She is able to decrease retention with cued and reflexive dry swallows.    Hopeful for dietary advancement as pt's swallow function improves.  She will need repeat MBS given silent nature of her dysphagia.   Will follow up for definitive dietary recommendations - at this time, until SLP can review MBS, Recommend single ice chips after oral care with multiple swallow and cough - re-swallow.  Advised pt to goal for continued strength of cough, voice and expectoration for airway protection.     Kathleen Lime, MS [facility] SLP Acute Rehab Services Office [phone] Pager [phone]

## 2022-02-24 NOTE — Progress Notes (Addendum)
PHARMACY NOTE -  Meropenem  Pharmacy has been assisting with dosing of meropenem for pneumonia. Dosage remains stable at 1g IV q8 hr and further renal adjustments per institutional Pharmacy antibiotic protocol  Pharmacy will sign off, following peripherally for culture results, dose adjustments, and length of therapy. Please reconsult if a change in clinical status warrants re-evaluation of dosage.  Reuel Boom, PharmD, BCPS [phone] 02/24/2022, 1:45 PM

## 2022-02-24 NOTE — Evaluation (Signed)
Occupational Therapy Evaluation Patient Details Name: Alice [name] MRN: [id number] DOB: Mar 18, 1985 Today's Date: 02/24/2022   History of Present Illness Patient is a 37 y.o. female presented to the emergency department with nausea vomiting diarrhea and a home flu test that was positive. Pt with multiple recent hospitalizations at outside hospitals. Pt at admitted to [facility] 12/11/21-12/30/21 with cellulitis and Candida intertrigo and pneumonia with sepsis treated with IV antibiotics discharged on a 10-day door course of doxycycline.Then pt admitted to [facility] from 67/89-38/10 for metabolic acidosis and ulcerative lesions on the inner thighs suspected to fungal intertrigo. Pt's sister reports pt struggling to mobilize once home and had a fall at home and brought to [facility], the patient was intubated and placed on mechanical life support prior to transfer. 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   Clinical Impression   Alice [name] is a 37 year old woman who prior to Thanksgiving was completely independent. She has four children age ranging from 47-8. Patient presents now with profound weakness,and decreased activity tolerance s/p 10 days intubated. On evaluation she requires max-total assist for almost all ADLs at bed level and total assist for bed mobility. She is able to bring right hand to mouth though she is currently NPO. She needs active assist to raise arms, exhibits minimal LE movement and demonstrated decreased dorsiflexion on the right. Patient will benefit from skilled OT services while in hospital to improve deficits and learn compensatory strategies as needed in order to return to PLOF.  Current recommendation is SNF with hope to advance recommendation to AIR as patient progresses.      Recommendations for  follow up therapy are one component of a multi-disciplinary discharge planning process, led by the attending physician.  Recommendations may be updated based on patient status, additional functional criteria and insurance authorization.   Follow Up Recommendations  Skilled nursing-short term rehab (<3 hours/day) (vs AIR pending progress)     Assistance Recommended at Discharge Frequent or constant Supervision/Assistance  Patient can return home with the following Two people to help with walking and/or transfers;Two people to help with bathing/dressing/bathroom;Assistance with cooking/housework;Direct supervision/assist for financial management;Direct supervision/assist for medications management;Help with stairs or ramp for entrance;Assistance with feeding    Functional Status Assessment  Patient has had a recent decline in their functional status and demonstrates the ability to make significant improvements in function in a reasonable and predictable amount of time.  Equipment Recommendations  Other (comment) (TBD)    Recommendations for Other Services       Precautions / Restrictions Precautions Precautions: Fall Precaution Comments: flexiseal, catheter, wounds at panus and Lt UE edema Restrictions Weight Bearing Restrictions: No      Mobility Bed Mobility               General bed mobility comments: Pt Total Assist for bed repositioning with turn assist from air mattress.    Transfers                          Balance                                           ADL either performed or assessed with  clinical judgement   ADL Overall ADL's : Needs assistance/impaired Eating/Feeding: NPO   Grooming: Wash/dry face;Set up;Bed level Grooming Details (indicate cue type and reason): with right hand Upper Body Bathing: Maximal assistance;Bed level   Lower Body Bathing: Total assistance;Bed level   Upper Body Dressing : Maximal assistance;Bed  level   Lower Body Dressing: Total assistance;Bed level   Toilet Transfer: Total assistance;+2 for physical assistance   Toileting- Clothing Manipulation and Hygiene: Total assistance;Bed level   Tub/ Shower Transfer: Total assistance   Functional mobility during ADLs: Total assistance;+2 for physical assistance       Vision Baseline Vision/History: 1 Wears glasses Vision Assessment?: No apparent visual deficits     Perception     Praxis      Pertinent Vitals/Pain Pain Assessment Pain Assessment: No/denies pain     Hand Dominance Right   Extremity/Trunk Assessment Upper Extremity Assessment Upper Extremity Assessment: RUE deficits/detail;LUE deficits/detail RUE Deficits / Details: Fingers, wrist and elbow demonstrate functional ROM and grossly 3+/5 strength.. Requires active assist for shoulder flexion/abduction and grossly 3-/5 strength. RUE Sensation: WNL RUE Coordination: decreased fine motor;decreased gross motor LUE Deficits / Details: LUE presents weaker than right. Unable to create full composite fist secondary to edema in fingers and palm. Grossly functional wrist ROM and strength ~ 3+/5, elbow 3-/5, 2+/5 shoulder LUE Coordination: decreased fine motor;decreased gross motor   Lower Extremity Assessment Lower Extremity Assessment: Defer to PT evaluation       Communication Communication Communication: Other (comment) (low voice)   Cognition Arousal/Alertness: Awake/alert Behavior During Therapy: Flat affect Overall Cognitive Status: Within Functional Limits for tasks assessed                                       General Comments       Exercises     Shoulder Instructions      Home Living Family/patient expects to be discharged to:: Private residence Living Arrangements: Children Available Help at Discharge: Family;Available PRN/intermittently Type of Home: Mobile home (double wide) Home Access: Stairs to enter Entrance  Stairs-Number of Steps: 3 Entrance Stairs-Rails: None Home Layout: One level     Bathroom Shower/Tub: Chief Strategy Officer: Standard     Home Equipment: None   Additional Comments: Patient lives with her 3 children, 21 y.o. and 51 y.o. twins. Father of the children assists as well. Pt has required assist from family for home making and gets groceries delivered. was driving. Pt's sister reports pt was ambulating in home PTA on 02/13/22.      Prior Functioning/Environment Prior Level of Function : Independent/Modified Independent (history obtained from pt's sister)             Mobility Comments: pt has been gradually declining in last few months, was mobilizing around home PTA on 02/13/22.          OT Problem List: Decreased strength;Decreased range of motion;Decreased activity tolerance;Impaired balance (sitting and/or standing);Decreased knowledge of use of DME or AE;Obesity;Impaired UE functional use      OT Treatment/Interventions: Self-care/ADL training;Therapeutic exercise;DME and/or AE instruction;Therapeutic activities;Balance training;Neuromuscular education;Patient/family education;Visual/perceptual remediation/compensation;Modalities    OT Goals(Current goals can be found in the care plan section) Acute Rehab OT Goals Patient Stated Goal: get stronger OT Goal Formulation: With patient Time For Goal Achievement: 03/10/22 Potential to Achieve Goals: Good  OT Frequency: Min 2X/week    Co-evaluation  AM-PAC OT "6 Clicks" Daily Activity     Outcome Measure Help from another person eating meals?: Total (NPO) Help from another person taking care of personal grooming?: A Little (face washing only) Help from another person toileting, which includes using toliet, bedpan, or urinal?: Total Help from another person bathing (including washing, rinsing, drying)?: A Lot Help from another person to put on and taking off regular upper body  clothing?: A Lot Help from another person to put on and taking off regular lower body clothing?: Total 6 Click Score: 10   End of Session Nurse Communication:  (okay to see)  Activity Tolerance: Patient tolerated treatment well Patient left: in bed;with call bell/phone within reach  OT Visit Diagnosis: Muscle weakness (generalized) (M62.81)                Time: 2119-4174 OT Time Calculation (min): 18 min Charges:  OT General Charges $OT Visit: 1 Visit OT Evaluation $OT Eval Low Complexity: 1 Low  Gustavo Lah, OTR/L [place]  Office [phone]   Lenward Chancellor 02/24/2022, 12:04 PM

## 2022-02-24 NOTE — Progress Notes (Signed)
Speech Language Pathology Treatment: Dysphagia  Patient Details Name: Alice [name] MRN: [id number] DOB: 1986-01-19 Today's Date: 02/24/2022 Time: 6283-6629 SLP Time Calculation (min) (ACUTE ONLY): 18 min  Assessment / Plan / Recommendation Clinical Impression  Pt seen to educate her to educate her to results of MBS and importance of swallow precautions. Pt now is in a normal bed and sitting fully upright, which will help maximize her airway protection.  Voice is even stronger currently than during MBS at 1230.  Using teach back imparted to [place] to importance of compliance with swallow precautions as she can NOT clear aspirates. She recalls MBS but needs encouragement to comprehend gravity of weakness ramifications.  SLP ordered house tray and messaged RN relaying that pt must be fed. Will follow closely for tolerance of po - and strengthening.  Marland Kitchen   HPI HPI: 37 year old female, morbidly obese, BMI 54, history of fatty liver, gastroesophageal reflux history of kidney stones, chronic left flank pain.  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 Amalia Hailey at [place] admited with [facility] - required intubation 1/1-02/22/2022. She failed swallow screen - has no feeding tube at this time.  Pt has had several hospital admits over the last few months.  Pt reports she does not want a feeding tube placed through her nose.  Today pt seen to assess indication for MBS, readiness for po intake.      SLP Plan  Continue with current plan of care      Recommendations for follow up therapy are one component of a multi-disciplinary discharge planning process, led by the attending physician.  Recommendations may be updated based on patient status, additional functional criteria and insurance authorization.    Recommendations  Diet recommendations: Dysphagia 3 (mechanical soft);Honey-thick liquid;Other(comment) (ice chips) Medication Administration: Other (Comment) (crushed  with applesauce) Supervision: Full supervision/cueing for compensatory strategies Compensations: Slow rate;Small sips/bites;Clear throat intermittently Postural Changes and/or Swallow Maneuvers: Seated upright 90 degrees;Upright 30-60 min after meal                Oral Care Recommendations: Oral care prior to ice chip/H20 Follow Up Recommendations: Skilled nursing-short term rehab (<3 hours/day) Assistance recommended at discharge: Frequent or constant Supervision/Assistance SLP Visit Diagnosis: Dysphagia, pharyngoesophageal phase (R13.14);Dysphagia, oral phase (R13.11);Dysphagia, pharyngeal phase (R13.13) Plan: Continue with current plan of care          Kathleen Lime, MS [place] Office [phone] Pager [phone]  Macario Golds  02/24/2022, 5:57 PM

## 2022-02-24 NOTE — Progress Notes (Signed)
Modified Barium Swallow Progress Note  Patient Details  Name: Alice [name] MRN: [id number] Date of Birth: 1985/05/09  Today's Date: 02/24/2022  Modified Barium Swallow completed.  Full report located under Chart Review in the Imaging Section.  Brief recommendations include the following:  Clinical Impression  Pt presents with mild oral and moderate pharyngeal dysphagia presumed due to edema from intubation impacting laryngeal closure.  Impaired laryngeal closure allows aspiration of thin and nectar consistencies during and after swallow. Mild amount of aspiration with thin and nectar liquid as well as secretions were not sensed and cued cough was NOT effective to clear.    Pt did reflexively cough *without clearance of airway* with large amounts of aspiration deep into trachea.  Chin tuck posture did not improve airway protection - worsened it.  Inconsistent delay in pharyngeal swallow reflex due to sensory deficits.  Honey thick, puree nor solids were aspirated -single episode of laryngeal penetration to cords with honey cleared with cued cough.  She is able to decrease retention with cued and reflexive dry swallows.  Pt is weak and must follow strict aspiration precautions - including sitting UPRIGHT for po intake.  She will likely need repeat MBS given silent nature of her dysphagia and impaired airway protection unless her symptoms significantly resolve.    Swallow Evaluation Recommendations       SLP Diet Recommendations: Dysphagia 3 (Mech soft) solids;Honey thick liquids ICE chips ok    Liquid Administration via: Cup, Tsp    Medication Administration: Crushed with puree       Compensations: Slow rate;Small sips/bites;Clear throat intermittently Stop po if coughing as overt aspirating - silent aspiration   Postural Changes: Remain semi-upright after after feeds/meals (Comment);Seated upright at 90 degrees   Oral Care Recommendations: Oral care BID      Kathleen Lime, MS [facility]  SLP Acute Rehab Services Office [phone] Pager [phone]   Macario Golds 02/24/2022,3:37 PM

## 2022-02-24 NOTE — Progress Notes (Signed)
Speech Language Pathology Treatment: Dysphagia  Patient Details Name: Alice [name] MRN: [id number] DOB: [birth year] Today's Date: 02/24/2022 Time: 5643-3295 SLP Time Calculation (min) (ACUTE ONLY): 10 min  Assessment / Plan / Recommendation Clinical Impression  Pt seen to determine indication for po diet and/or MBS.   Pt awake in bed with HOB reclined significantly, empty cup lying on her chest - - she stated she consumed ice chips.   She reports ongoing coughing with ice chips, SLP advised against positioning for consumption however.  Vocal strength is marginally better today than last night - but she continues with significant weakness.    PO trials including ice chips and nectar thick liquids provided. Elevated HOB as much as capable and had pt flex head forward for optimal position.  Swallow clinically judged to be timely but unfortunately pt continues to present with congested cough.  With verbal cues, for cough and expectoration -  she is unable to complete tasks - stating "I'm trying". She does have flutter valve in room with her and reports she is using it to help airway strength.    At this time, pt will benefit from [facility] to allow instrumental evaluation of swallow given her lengthy intubation.  Informed xray, RN, and PT assistant.  Called pt to inform her of planned time for MBS - noonish.       HPI HPI: 37 year old female, morbidly obese, BMI 54, history of fatty liver, gastroesophageal reflux history of kidney stones, chronic left flank pain.  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 Amalia Hailey at [facility] admited with ARDS - required intubation 1/1-02/22/2022. She failed swallow screen - has no feeding tube at this time.  Pt has had several hospital admits over the last few months.  Pt reports she does not want a feeding tube placed through her nose.  Today pt seen to assess indication for MBS, readiness for po intake.      SLP Plan  MBS       Recommendations for follow up therapy are one component of a multi-disciplinary discharge planning process, led by the attending physician.  Recommendations may be updated based on patient status, additional functional criteria and insurance authorization.    Recommendations  Diet recommendations: NPO Medication Administration: Other (Comment) (with nectar) Compensations: Slow rate;Small sips/bites Postural Changes and/or Swallow Maneuvers: Seated upright 90 degrees;Upright 30-60 min after meal                Oral Care Recommendations: Oral care prior to ice chip/H20 Follow Up Recommendations: Skilled nursing-short term rehab (<3 hours/day) Assistance recommended at discharge: Frequent or constant Supervision/Assistance SLP Visit Diagnosis: Dysphagia, pharyngeal phase (R13.13);Dysphagia, pharyngoesophageal phase (R13.14) Plan: MBS         Kathleen Lime, MS [facility] SLP Acute Rehab Services Office [phone] Pager [phone]   Macario Golds  02/24/2022, 8:15 AM

## 2022-02-24 NOTE — Progress Notes (Signed)
[facility]                                                                              Alice [name], is a 37 y.o. 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 [name] at [facility].  She presented to ED with nausea vomiting diarrhea and home flu test which was positive.  Patient was recently admitted to [facility] 12/10-12/16 for non-anion gap metabolic acidosis, ulcerative lesions on the inner thighs suspected due to fungal intertrigo.  Seen by dermatology and did not feel the patient had pyoderma gangrenosum.  She was treated with fluconazole and discharged with 7 more days of fluconazole.  She was recommended wound care with Aquacel Ag.  Previously was admitted at [facility] on 12/11/2021-12/30/2021 with cellulitis and candidal intertrigo and pneumonia, was treated with IV antibiotics and discharged on a 10-day course of doxycycline.  In ED, was noted to be hypothermic with temp 94 F, tachycardiac, tachypneic.  Sodium 133 creatinine 2.25.  Lactic acid 3.2. Influenza B+.  COVID RSV and influenza A negative. ABG showed pH of 7.05, pCO2 30, pO2 81, bicarb 8.  WBC count 18,000.  Patient was intubated admitted to ICU.  Chest x-ray showed bilateral interstitial infiltrates  1/1 admitted with severe ARDS, influenza pneumonia. Proned with marked improvement in P: F ratio 1/2 supinated, O2 better, ventilation better, renal function stable but with AKI 1/5 remains on the ventilator, sedated.  Repeating potassium.  Off pressors 1/7 Fever/leukocytosis, started on empric abx with unclear source.  1/9 following more commands,  intermittent agitation, extubated, failed SLP, intermittent BiPAP 1/11: Transfer to the floor, TRH assumed care.  Failed SLP, NPO, plan for MBS today   Assessment & Plan    Principal Problem:   Multifocal pneumonia acute respiratory failure with hypoxia, ARDS Influenza B positive pneumonia with superimposed bacterial pneumonia -Patient was not added on admission, extubated on 1/9, currently O2 sats 91 to 93% on room air -Completed Tamiflu on 1/17 -Not needed BiPAP, continue IV meropenem -Continue incentive spirited, flutter valve, mobilize with PT  Active problems Sepsis, suspected UTI  - Tmax 101.2 F on 1/10, afebrile this a.m. -Tracheal aspirate with rare Candida albicans -UA showed large leukocytes, > 50 WBCs, rare bacteria, urine culture showed no growth -Patient started on IV meropenem. -If spiking fevers again, will obtain CT chest, abdomen and pelvis, ?  Drug fever   Acute kidney injury, non-anion gap metabolic acidosis Hypernatremia -Likely due to diarrhea, poor p.o. intake -Bicarb was discontinued and patient was started on D5 infusion by CCM on 1/10 -Sodium 151 on 1/10, mildly improved to 150 today -Plan for MBS today, if patient fails MBS, will place core track and start free water -Increase D5 to 100 cc/  hour    Chronic Panniculitis  Open wound on abdomen  Chronic fungal intertrigo  -Wound care following, continue dressing changes as needed -CT abdomen showed no soft tissue infection or abscess -Continue meropenem  Hyperammonemia -RUQ [place] with hepatic steatosis, -Ammonia level improved to 19.  Ammonia level 42 on 1/6 -  cont to hold lactulose given significant diarrhea/ GI loss    Hypokalemia -Replaced  Hypomagnesemia, hypophosphatemia -Resolved  Protein calorie malnutrition -Likely due to n.p.o. status, pending MBS    Acute metabolic encephalopathy  -Possibly due to polypharmacy, multiple psych medications and PNA -Patient's sister had reported to  PCCM about finding multiple empty bottles of ibuprofen at home. -LFTs currently stable -Now alert and oriented, continue to monitor closely  Delirium, improving She is on prozac, gabapentin, olanzapine, suboxone, trazodone chronically -Continue to hold while n.p.o.     Obesity, Class III, BMI 40-49.9 (morbid obesity) (HCC) Estimated body mass index is 48.09 kg/m as calculated from the following:   Height as of this encounter: 5\' 9"  (1.753 m).   Weight as of this encounter: 147.7 kg.  Code Status: Full code DVT Prophylaxis:  SCDs Start: 02/14/22 0002   Level of Care: Level of care: Progressive Family Communication: Updated patient's boyfriend at the bedside   Disposition Plan:      Remains inpatient appropriate: N.p.o., failed SLP   Procedures:  Intubation, extubation  Consultants:   Admitted by CCM  Antimicrobials:   Anti-infectives (From admission, onward)    Start     Dose/Rate Route Frequency Ordered Stop   02/23/22 1030  meropenem (MERREM) 1 g in sodium chloride 0.9 % 100 mL IVPB        1 g 200 mL/hr over 30 Minutes Intravenous Every 8 hours 02/23/22 1005     02/20/22 1000  piperacillin-tazobactam (ZOSYN) IVPB 3.375 g  Status:  Discontinued        3.375 g 12.5 mL/hr over 240 Minutes Intravenous Every 8 hours 02/20/22 0910 02/23/22 0938   02/19/22 2200  oseltamivir (TAMIFLU) 6 MG/ML suspension 75 mg        75 mg Per Tube 2 times daily 02/19/22 0951 02/20/22 0928   02/19/22 1000  oseltamivir (TAMIFLU) 6 MG/ML suspension 75 mg  Status:  Discontinued        75 mg Per Tube 2 times daily 02/19/22 0835 02/19/22 0951   02/19/22 1000  ceFEPIme (MAXIPIME) 2 g in sodium chloride 0.9 % 100 mL IVPB  Status:  Discontinued        2 g 200 mL/hr over 30 Minutes Intravenous Every 8 hours 02/19/22 0856 02/20/22 0856   02/15/22 2200  oseltamivir (TAMIFLU) 6 MG/ML suspension 30 mg  Status:  Discontinued       See Hyperspace for full Linked Orders Report.   30 mg Per Tube 2 times  daily 02/15/22 1008 02/15/22 1714   02/15/22 2200  oseltamivir (TAMIFLU) 6 MG/ML suspension 30 mg  Status:  Discontinued        30 mg Per Tube 2 times daily 02/15/22 1714 02/19/22 0835   02/15/22 1200  oseltamivir (TAMIFLU) 6 MG/ML suspension 75 mg       See Hyperspace for full Linked Orders Report.   75 mg Per Tube  Once 02/15/22 1008 02/15/22 1224   02/15/22 1200  linezolid (ZYVOX) IVPB 600 mg        600 mg 300 mL/hr over 60 Minutes Intravenous Every 12 hours 02/15/22 1016 02/21/22 2311   02/14/22 1000  oseltamivir (  TAMIFLU) capsule 30 mg  Status:  Discontinued        30 mg Oral 2 times daily 02/13/22 2159 02/15/22 1008   02/14/22 0800  ceFEPIme (MAXIPIME) 2 g in sodium chloride 0.9 % 100 mL IVPB  Status:  Discontinued        2 g 200 mL/hr over 30 Minutes Intravenous Every 12 hours 02/13/22 1946 02/19/22 0856   02/13/22 2000  metroNIDAZOLE (FLAGYL) IVPB 500 mg  Status:  Discontinued        500 mg 100 mL/hr over 60 Minutes Intravenous Every 12 hours 02/13/22 1908 02/15/22 1019   02/13/22 1953  vancomycin variable dose per unstable renal function (pharmacist dosing)  Status:  Discontinued         Does not apply See admin instructions 02/13/22 1953 02/15/22 1016   02/13/22 1945  oseltamivir (TAMIFLU) capsule 75 mg  Status:  Discontinued        75 mg Oral  Once 02/13/22 1943 02/15/22 1008   02/13/22 1915  ceFEPIme (MAXIPIME) 2 g in sodium chloride 0.9 % 100 mL IVPB        2 g 200 mL/hr over 30 Minutes Intravenous  Once 02/13/22 1908 02/13/22 2008   02/13/22 1845  vancomycin (VANCOCIN) 2,500 mg in sodium chloride 0.9 % 500 mL IVPB        2,500 mg 262.5 mL/hr over 120 Minutes Intravenous  Once 02/13/22 1835 02/13/22 2137   02/13/22 1845  piperacillin-tazobactam (ZOSYN) IVPB 3.375 g  Status:  Discontinued        3.375 g 100 mL/hr over 30 Minutes Intravenous  Once 02/13/22 1836 02/13/22 1937   02/13/22 1630  amoxicillin (AMOXIL) capsule 1,000 mg        1,000 mg Oral STAT 02/13/22 1625  02/13/22 1640          Medications  Chlorhexidine Gluconate Cloth  6 each Topical Daily   docusate sodium  100 mg Oral BID   enoxaparin (LOVENOX) injection  80 mg Subcutaneous Q24H   guaiFENesin-dextromethorphan  15 mL Oral Q6H   influenza vac split quadrivalent PF  0.5 mL Intramuscular Tomorrow-1000   ipratropium-albuterol  3 mL Nebulization TID   mouth rinse  15 mL Mouth Rinse 4 times per day   pantoprazole (PROTONIX) IV  40 mg Intravenous Q24H   pneumococcal 20-valent conjugate vaccine  0.5 mL Intramuscular Tomorrow-1000      Subjective:   [place] was seen and examined today.  Fairly alert and oriented, boyfriend at the bedside.  Following commands.  Failed bedside swallow evaluation.  Overnight did not spike any fevers.  No acute chest pain, shortness of breath, nausea or vomiting.   Objective:   Vitals:   02/24/22 0011 02/24/22 0426 02/24/22 0820 02/24/22 0856  BP: (!) 158/80 (!) 159/92  (!) 163/89  Pulse: 89 90  98  Resp: (!) 22 (!) 22  16  Temp: 99 F (37.2 C) (!) 97.5 F (36.4 C)  98.3 F (36.8 C)  TempSrc: Oral Oral  Oral  SpO2: 94% 93% 91% 91%  Weight:      Height:        Intake/Output Summary (Last 24 hours) at 02/24/2022 1025 Last data filed at 02/24/2022 0050 Gross per 24 hour  Intake 369.79 ml  Output 1725 ml  Net -1355.21 ml     Wt Readings from Last 3 Encounters:  02/23/22 (!) 147.7 kg  01/24/19 (!) 167.4 kg  11/02/18 (!) 164.7 kg     Exam General:  Alert and oriented x 3, NAD Cardiovascular: S1 S2 auscultated,  RRR Respiratory: Fairly CTAB Gastrointestinal: Morbidly obese, soft, nontender, nondistended, + bowel sounds Ext: no pedal edema bilaterally Neuro: no new FND's Psych: Normal affect    Data Reviewed:  I have personally reviewed following labs    CBC Lab Results  Component Value Date   WBC 15.2 (H) 02/24/2022   RBC 2.60 (L) 02/24/2022   HGB 7.9 (L) 02/24/2022   HCT 25.5 (L) 02/24/2022   MCV 98.1 02/24/2022    MCH 30.4 02/24/2022   PLT 357 02/24/2022   MCHC 31.0 02/24/2022   RDW 17.5 (H) 02/24/2022   LYMPHSABS 2.7 02/22/2022   MONOABS 2.0 (H) 02/22/2022   EOSABS 0.0 02/22/2022   BASOSABS 0.0 41/74/0814     Last metabolic panel Lab Results  Component Value Date   NA 150 (H) 02/24/2022   K 3.4 (L) 02/24/2022   CL 127 (H) 02/24/2022   CO2 19 (L) 02/24/2022   BUN 21 (H) 02/24/2022   CREATININE 0.71 02/24/2022   GLUCOSE 117 (H) 02/24/2022   GFRNONAA >60 02/24/2022   GFRAA 93 11/02/2018   CALCIUM 8.2 (L) 02/24/2022   PHOS 3.7 02/23/2022   PROT 5.5 (L) 02/21/2022   ALBUMIN 1.8 (L) 02/21/2022   LABGLOB 2.7 11/02/2018   AGRATIO 1.6 11/02/2018   BILITOT 0.5 02/21/2022   ALKPHOS 120 02/21/2022   AST 20 02/21/2022   ALT 15 02/21/2022   ANIONGAP 4 (L) 02/24/2022    CBG (last 3)  Recent Labs    02/22/22 2321 02/23/22 0356 02/23/22 0748  GLUCAP 121* 110* 105*      Coagulation Profile: No results for input(s): "INR", "PROTIME" in the last 168 hours.   Radiology Studies: I have personally reviewed the imaging studies  No results found.     Estill Cotta M.D. [facility] 02/24/2022, 10:25 AM  Available via Epic secure chat 7am-7pm After 7 pm, please refer to night coverage provider listed on amion.

## 2022-02-25 DIAGNOSIS — R112 Nausea with vomiting, unspecified: Secondary | ICD-10-CM | POA: Diagnosis not present

## 2022-02-25 DIAGNOSIS — N179 Acute kidney failure, unspecified: Secondary | ICD-10-CM | POA: Diagnosis not present

## 2022-02-25 DIAGNOSIS — J189 Pneumonia, unspecified organism: Secondary | ICD-10-CM | POA: Diagnosis not present

## 2022-02-25 DIAGNOSIS — R1084 Generalized abdominal pain: Secondary | ICD-10-CM | POA: Diagnosis not present

## 2022-02-25 LAB — CBC
HCT: 28.3 % — ABNORMAL LOW (ref 36.0–46.0)
Hemoglobin: 8.6 g/dL — ABNORMAL LOW (ref 12.0–15.0)
MCH: 30.7 pg (ref 26.0–34.0)
MCHC: 30.4 g/dL (ref 30.0–36.0)
MCV: 101.1 fL — ABNORMAL HIGH (ref 80.0–100.0)
Platelets: 412 10*3/uL — ABNORMAL HIGH (ref 150–400)
RBC: 2.8 MIL/uL — ABNORMAL LOW (ref 3.87–5.11)
RDW: 17.7 % — ABNORMAL HIGH (ref 11.5–15.5)
WBC: 12.3 10*3/uL — ABNORMAL HIGH (ref 4.0–10.5)
nRBC: 0 % (ref 0.0–0.2)

## 2022-02-25 LAB — BASIC METABOLIC PANEL
Anion gap: 8 (ref 5–15)
BUN: 18 mg/dL (ref 6–20)
CO2: 18 mmol/L — ABNORMAL LOW (ref 22–32)
Calcium: 8.4 mg/dL — ABNORMAL LOW (ref 8.9–10.3)
Chloride: 114 mmol/L — ABNORMAL HIGH (ref 98–111)
Creatinine, Ser: 0.63 mg/dL (ref 0.44–1.00)
GFR, Estimated: >60 mL/min (ref >60)
Glucose, Bld: 113 mg/dL — ABNORMAL HIGH (ref 70–99)
Potassium: 3.1 mmol/L — ABNORMAL LOW (ref 3.5–5.1)
Sodium: 140 mmol/L (ref 135–145)

## 2022-02-25 MED ORDER — ADULT MULTIVITAMIN W/MINERALS CH
1.0000 | ORAL_TABLET | Freq: Every day | ORAL | Status: DC
  Administered 2022-02-25 – 2022-03-03 (×7): 1 via ORAL
  Filled 2022-02-25 (×7): qty 1

## 2022-02-25 MED ORDER — POTASSIUM CHLORIDE CRYS ER 20 MEQ PO TBCR
40.0000 meq | EXTENDED_RELEASE_TABLET | Freq: Once | ORAL | Status: AC
  Administered 2022-02-25: 40 meq via ORAL
  Filled 2022-02-25: qty 2

## 2022-02-25 MED ORDER — ENSURE ENLIVE PO LIQD
237.0000 mL | Freq: Three times a day (TID) | ORAL | Status: DC
  Administered 2022-02-26 – 2022-03-03 (×10): 237 mL via ORAL

## 2022-02-25 NOTE — Progress Notes (Signed)
Speech Language Pathology Treatment: Dysphagia  Patient Details Name: Alice [name] MRN: [id number] DOB: 08-26-1985 Today's Date: 02/25/2022 Time: 8657-8469 SLP Time Calculation (min) (ACUTE ONLY): 24 min  Assessment / Plan / Recommendation Clinical Impression  Patient seen to assure po tolerance s/p MBS. She reports her voice strength today to be a 6/10 = Subjectively to this SLP -voice is marginally improved compared to yesterday= she is still hoarse.   Session also focused on strengthenig her tongue base and expectoration ability with demonstration.  Unable to perform adequately with efforts x3 - however after delayed reflexive cough - pt able to expectorate clear secretions.   Reeducated pt to findings of her MBS, silent aspiration indicating need to use significant caution with po - to which she reported understanding using teach back. Pt observed consuming gingerale honey thick, graham cracker, macaroni and cheese and [place] Ice.  Swallow was timely without reflexive coughing nor requirement of multiple swallows.  Recommend continue diet with precautions and SLP will follow up Monday.  Pt's cough was productive today, indicative of improved conditioning!   Pt agreeable to plan and reports using her "fish" - the flutter valve.    HPI HPI: 37 year old female, morbidly obese, BMI 54, history of fatty liver, gastroesophageal reflux history of kidney stones, chronic left flank pain.  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 Amalia Hailey at [place] admited with ARDS - required intubation 1/1-02/22/2022. She failed swallow screen - has no feeding tube at this time.  Pt has had several hospital admits over the last few months.  Pt reports she does not want a feeding tube placed through her nose.  Today pt seen to assure po tolerance s/p MBS.      SLP Plan  Continue with current plan of care      Recommendations for follow up therapy are one component of a  multi-disciplinary discharge planning process, led by the attending physician.  Recommendations may be updated based on patient status, additional functional criteria and insurance authorization.    Recommendations  Diet recommendations: Dysphagia 3 (mechanical soft);Honey-thick liquid;Other(comment) (dys3/HTL) Liquids provided via: Cup;Straw Medication Administration: Other (Comment) (whole if small - crushed if large and not contraindicated) Supervision: Full supervision/cueing for compensatory strategies Compensations: Slow rate;Small sips/bites;Clear throat intermittently Postural Changes and/or Swallow Maneuvers: Seated upright 90 degrees;Upright 30-60 min after meal                Oral Care Recommendations: Oral care prior to ice chip/H20 Follow Up Recommendations: Skilled nursing-short term rehab (<3 hours/day) Assistance recommended at discharge: Frequent or constant Supervision/Assistance SLP Visit Diagnosis: Dysphagia, pharyngoesophageal phase (R13.14);Dysphagia, oral phase (R13.11);Dysphagia, pharyngeal phase (R13.13) Plan: Continue with current plan of care          Kathleen Lime, MS [place] Office [phone] Pager [phone]  Macario Golds  02/25/2022, 2:48 PM

## 2022-02-25 NOTE — Progress Notes (Signed)
[facility]                                                                              Alice [name], is a 37 y.o. 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 Amalia Hailey at [facility].  She presented to ED with nausea vomiting diarrhea and home flu test which was positive.  Patient was recently admitted to [facility] 12/10-12/16 for non-anion gap metabolic acidosis, ulcerative lesions on the inner thighs suspected due to fungal intertrigo.  Seen by dermatology and did not feel the patient had pyoderma gangrenosum.  She was treated with fluconazole and discharged with 7 more days of fluconazole.  She was recommended wound care with Aquacel Ag.  Previously was admitted at [facility] on 12/11/2021-12/30/2021 with cellulitis and candidal intertrigo and pneumonia, was treated with IV antibiotics and discharged on a 10-day course of doxycycline.  In ED, was noted to be hypothermic with temp 94 F, tachycardiac, tachypneic.  Sodium 133 creatinine 2.25.  Lactic acid 3.2. Influenza B+.  COVID RSV and influenza A negative. ABG showed pH of 7.05, pCO2 30, pO2 81, bicarb 8.  WBC count 18,000.  Patient was intubated admitted to ICU.  Chest x-ray showed bilateral interstitial infiltrates  1/1 admitted with severe ARDS, influenza pneumonia. Proned with marked improvement in P: F ratio 1/2 supinated, O2 better, ventilation better, renal function stable but with AKI 1/5 remains on the ventilator, sedated.  Repeating potassium.  Off pressors 1/7 Fever/leukocytosis, started on empric abx with unclear source.  1/9 following more commands,  intermittent agitation, extubated, failed SLP, intermittent BiPAP 1/11: Transfer to the floor, TRH assumed care.  Failed SLP, NPO, plan for MBS -> passed, started on diet   Assessment & Plan    Principal Problem:   Multifocal pneumonia acute respiratory failure with hypoxia, ARDS Influenza B positive pneumonia with superimposed bacterial pneumonia -Patient was not added on admission, extubated on 1/9, currently O2 sats 91 to 93% on room air -Completed Tamiflu on 1/17 -Continue incentive spirited, flutter valve, mobilize with PT -Currently on IV meropenem, will continue -O2 sats 95% on room air  Active problems Sepsis, suspected UTI  - tracheal aspirate with rare Candida albicans -UA showed large leukocytes, > 50 WBCs, rare bacteria, urine culture showed no growth -Continue IV meropenem   Acute kidney injury, non-anion gap metabolic acidosis Hypernatremia -Likely due to diarrhea, poor p.o. intake -Bicarb was discontinued and patient was started on D5 infusion by CCM on 1/10 -Sodium improving, 140 -Passed MBS, started on dysphagia 3 diet    Chronic Panniculitis  Open wound on abdomen  Chronic fungal intertrigo  -Wound care following, continue dressing changes as needed -CT abdomen showed no soft tissue infection or abscess -Continue meropenem  Hyperammonemia -RUQ [place] with hepatic steatosis, -Ammonia level improved to 19.  Ammonia level 42 on 1/6 -Lactulose on hold due to diarrhea -Alert and oriented    Hypokalemia -Replaced p.o. 40 mEq x 1  Hypomagnesemia, hypophosphatemia -Resolved  Protein calorie malnutrition -Likely due to n.p.o. status, pending MBS    Acute metabolic encephalopathy  -Possibly due to polypharmacy, multiple psych medications and PNA -Patient's sister had reported to PCCM about finding multiple empty bottles of ibuprofen at home. -LFTs currently stable -Fairly alert.  Delirium, improving She is on prozac, gabapentin, olanzapine, suboxone,  trazodone chronically -Continue to hold while n.p.o.     Obesity, Class III, BMI 40-49.9 (morbid obesity) (HCC) Estimated body mass index is 48.09 kg/m as calculated from the following:   Height as of this encounter: 5\' 9"  (1.753 m).   Weight as of this encounter: 147.7 kg.  Code Status: Full code DVT Prophylaxis:  SCDs Start: 02/14/22 0002   Level of Care: Level of care: Progressive Family Communication: Updated patient's boyfriend at the bedside on 1/11   Disposition Plan:      Remains inpatient appropriate: Passed MBS, now started on diet.  PT recommending SNF.   Procedures:  Intubation, extubation MBS  Consultants:   Admitted by CCM  Antimicrobials:   Anti-infectives (From admission, onward)    Start     Dose/Rate Route Frequency Ordered Stop   02/23/22 1030  meropenem (MERREM) 1 g in sodium chloride 0.9 % 100 mL IVPB        1 g 200 mL/hr over 30 Minutes Intravenous Every 8 hours 02/23/22 1005     02/20/22 1000  piperacillin-tazobactam (ZOSYN) IVPB 3.375 g  Status:  Discontinued        3.375 g 12.5 mL/hr over 240 Minutes Intravenous Every 8 hours 02/20/22 0910 02/23/22 0938   02/19/22 2200  oseltamivir (TAMIFLU) 6 MG/ML suspension 75 mg        75 mg Per Tube 2 times daily 02/19/22 0951 02/20/22 0928   02/19/22 1000  oseltamivir (TAMIFLU) 6 MG/ML suspension 75 mg  Status:  Discontinued        75 mg Per Tube 2 times daily 02/19/22 0835 02/19/22 0951   02/19/22 1000  ceFEPIme (MAXIPIME) 2 g in sodium chloride 0.9 % 100 mL IVPB  Status:  Discontinued        2 g 200 mL/hr over 30 Minutes Intravenous Every 8 hours 02/19/22 0856 02/20/22 0856   02/15/22 2200  oseltamivir (TAMIFLU) 6 MG/ML suspension 30 mg  Status:  Discontinued       See Hyperspace for full Linked Orders Report.   30 mg Per Tube 2 times daily 02/15/22 1008 02/15/22 1714   02/15/22 2200  oseltamivir (TAMIFLU) 6 MG/ML suspension 30 mg  Status:  Discontinued        30 mg Per Tube 2 times daily 02/15/22  1714 02/19/22 0835   02/15/22 1200  oseltamivir (TAMIFLU) 6 MG/ML suspension 75 mg       See Hyperspace for full Linked Orders Report.   75 mg Per Tube  Once 02/15/22 1008 02/15/22 1224   02/15/22 1200  linezolid (ZYVOX) IVPB 600 mg        600 mg 300 mL/hr over 60 Minutes Intravenous Every 12 hours 02/15/22 1016 02/21/22 2311   02/14/22 1000  oseltamivir (TAMIFLU) capsule 30 mg  Status:  Discontinued        30 mg Oral 2 times daily 02/13/22 2159 02/15/22 1008   02/14/22 0800  ceFEPIme (MAXIPIME) 2 g in sodium chloride 0.9 % 100 mL IVPB  Status:  Discontinued        2 g 200 mL/hr over 30 Minutes Intravenous Every 12 hours 02/13/22 1946 02/19/22 0856   02/13/22 2000  metroNIDAZOLE (FLAGYL) IVPB 500 mg  Status:  Discontinued        500 mg 100 mL/hr over 60 Minutes Intravenous Every 12 hours 02/13/22 1908 02/15/22 1019   02/13/22 1953  vancomycin variable dose per unstable renal function (pharmacist dosing)  Status:  Discontinued         Does not apply See admin instructions 02/13/22 1953 02/15/22 1016   02/13/22 1945  oseltamivir (TAMIFLU) capsule 75 mg  Status:  Discontinued        75 mg Oral  Once 02/13/22 1943 02/15/22 1008   02/13/22 1915  ceFEPIme (MAXIPIME) 2 g in sodium chloride 0.9 % 100 mL IVPB        2 g 200 mL/hr over 30 Minutes Intravenous  Once 02/13/22 1908 02/13/22 2008   02/13/22 1845  vancomycin (VANCOCIN) 2,500 mg in sodium chloride 0.9 % 500 mL IVPB        2,500 mg 262.5 mL/hr over 120 Minutes Intravenous  Once 02/13/22 1835 02/13/22 2137   02/13/22 1845  piperacillin-tazobactam (ZOSYN) IVPB 3.375 g  Status:  Discontinued        3.375 g 100 mL/hr over 30 Minutes Intravenous  Once 02/13/22 1836 02/13/22 1937   02/13/22 1630  amoxicillin (AMOXIL) capsule 1,000 mg        1,000 mg Oral STAT 02/13/22 1625 02/13/22 1640          Medications  Chlorhexidine Gluconate Cloth  6 each Topical Daily   docusate sodium  100 mg Oral BID   enoxaparin (LOVENOX) injection  80 mg  Subcutaneous Q24H   guaiFENesin-dextromethorphan  15 mL Oral Q6H   influenza vac split quadrivalent PF  0.5 mL Intramuscular Tomorrow-1000   ipratropium-albuterol  3 mL Nebulization TID   mouth rinse  15 mL Mouth Rinse 4 times per day   pantoprazole (PROTONIX) IV  40 mg Intravenous Q24H   pneumococcal 20-valent conjugate vaccine  0.5 mL Intramuscular Tomorrow-1000      Subjective:   [place] was seen and examined today.  Fairly alert, awake, no acute complaints.  Following commands.  Afebrile, no nausea vomiting or diarrhea.  Foley catheter, Flexi-Seal removed.   Objective:   Vitals:   02/24/22 1952 02/24/22 2123 02/25/22 0429 02/25/22 0735  BP:  (!) 143/94 (!) 152/99   Pulse:  97 95   Resp:  (!) 26 (!) 22   Temp:   97.7 F (36.5 C)   TempSrc:   Oral   SpO2: 93%  95% 95%  Weight:      Height:        Intake/Output Summary (Last 24 hours) at 02/25/2022 1159 Last data filed at 02/25/2022 0500 Gross per 24 hour  Intake 900 ml  Output 551 ml  Net 349 ml     Wt Readings from Last 3 Encounters:  02/23/22 (!) 147.7 kg  01/24/19 (!) 167.4 kg  11/02/18 (!) 164.7 kg    Physical Exam General: Alert and oriented x 3, NAD Cardiovascular: S1 S2 clear, RRR.  Respiratory: CTAB, no wheezing, rales or rhonchi Gastrointestinal: Morbidly obese, soft, nontender,  NBS Ext: no pedal edema bilaterally Neuro: no new deficits Skin: Abdominal wound (pictures taken today Psych: Normal affect and demeanor, alert and oriented x3  Data Reviewed:  I have personally reviewed following labs    CBC Lab Results  Component Value Date   WBC 12.3 (H) 02/25/2022   RBC 2.80 (L) 02/25/2022   HGB 8.6 (L) 02/25/2022   HCT 28.3 (L) 02/25/2022   MCV 101.1 (H) 02/25/2022   MCH 30.7 02/25/2022   PLT 412 (H) 02/25/2022   MCHC 30.4 02/25/2022   RDW 17.7 (H) 02/25/2022   LYMPHSABS 2.7 02/22/2022   MONOABS 2.0 (H) 02/22/2022   EOSABS 0.0 02/22/2022   BASOSABS 0.0 02/22/2022      Last metabolic panel Lab Results  Component Value Date   NA 140 02/25/2022   K 3.1 (L) 02/25/2022   CL 114 (H) 02/25/2022   CO2 18 (L) 02/25/2022   BUN 18 02/25/2022   CREATININE 0.63 02/25/2022   GLUCOSE 113 (H) 02/25/2022   GFRNONAA >60 02/25/2022   GFRAA 93 11/02/2018   CALCIUM 8.4 (L) 02/25/2022   PHOS 3.7 02/23/2022   PROT 5.5 (L) 02/21/2022   ALBUMIN 1.8 (L) 02/21/2022   LABGLOB 2.7 11/02/2018   AGRATIO 1.6 11/02/2018   BILITOT 0.5 02/21/2022   ALKPHOS 120 02/21/2022   AST 20 02/21/2022   ALT 15 02/21/2022   ANIONGAP 8 02/25/2022    CBG (last 3)  Recent Labs    02/22/22 2321 02/23/22 0356 02/23/22 0748  GLUCAP 121* 110* 105*      Coagulation Profile: No results for input(s): "INR", "PROTIME" in the last 168 hours.   Radiology Studies: I have personally reviewed the imaging studies  DG Swallowing Func-Speech Pathology  Result Date: 02/24/2022 Table formatting from the original result was not included. 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 [name] at [facility]  Morbid obesity [facility])   Preterm labor   Round ligament pain 09/10/2012  Torn ACL  Past Surgical History: Past Surgical History: Procedure Laterality Date  addenoidectomy    CESAREAN SECTION N/A 02/22/2013  Procedure: CESAREAN SECTION/TWINS;  Surgeon: [name], MD;  Location: [facility];  Service: Obstetrics;  Laterality: N/A;  CHOLECYSTECTOMY    GALLBLADDER SURGERY  2019  NASAL SINUS SURGERY    NEPHRECTOMY    right side  OTHER SURGICAL HISTORY    ulner nerve removal  TONSILLECTOMY   HPI: 37 year old female, morbidly obese, BMI 54, history of fatty liver, gastroesophageal reflux history of kidney stones, chronic left flank pain.  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 [name] at [facility].  Pt admited with ARDS - required intubation 1/1-02/22/2022. She failed swallow screen - has no feeding tube at this time.  Pt has had several hospital admits over the last few months.  Pt reports she does not want a feeding tube placed through her nose.  Today pt seen to assess indication for MBS, readiness for po intake.  Subjective: pt awake  in bariatric recliner in xray  Recommendations for follow up therapy are one component of a multi-disciplinary discharge planning process, led by the attending physician.  Recommendations may be updated based on patient status, additional functional criteria and insurance authorization. Assessment / Plan / Recommendation   02/24/2022   3:30 PM Clinical Impressions Clinical Impression Pt presents with mild oral and moderate pharyngeal dysphagia presumed due to edema from intubation impacting laryngeal closure.  Impaired laryngeal closure allows aspiration of thin and nectar consistencies during and after swallow. Mild amount of aspiration with thin and nectar liquid as well as secretions were not sensed and cued cough was NOT effective to clear.    Pt did reflexively cough *without clearance of airway* with large amounts of aspiration deep into trachea.  Chin tuck posture did not improve airway protection - worsened it.  Inconsistent delay in pharyngeal swallow reflex due to sensory deficits.  Honey thick, puree nor solids were aspirated -single episode of laryngeal penetration to cords with honey cleared with cued  cough.  She is able to decrease retention with cued and reflexive dry swallows.  Pt is weak and must follow strict aspiration precautions - including sitting UPRIGHT for po intake.  She will need repeat MBS given silent nature of her dysphagia. SLP Visit Diagnosis Dysphagia, pharyngoesophageal phase (R13.14);Dysphagia, oral phase (R13.11);Dysphagia, pharyngeal phase (R13.13) Impact on safety and function Mild aspiration risk;Moderate aspiration risk     02/24/2022   3:30 PM Treatment Recommendations Treatment Recommendations Therapy as outlined in treatment plan below     02/24/2022   3:36 PM Prognosis Prognosis for Safe Diet Advancement Fair Barriers to Reach Goals Other (Comment)   02/24/2022   3:30 PM Diet Recommendations SLP Diet Recommendations Dysphagia 3 (Mech soft) solids;Honey thick liquids Liquid Administration via Cup Medication Administration Crushed with puree Compensations Slow rate;Small sips/bites;Clear throat intermittently Postural Changes Remain semi-upright after after feeds/meals (Comment);Seated upright at 90 degrees     02/24/2022   3:30 PM Other Recommendations Oral Care Recommendations Oral care BID Follow Up Recommendations Skilled nursing-short term rehab (<3 hours/day) Functional Status Assessment Patient has had a recent decline in their functional status and/or demonstrates limited ability to make significant improvements in function in a reasonable and predictable amount of time   02/24/2022   3:30 PM Frequency and Duration  Speech Therapy Frequency (ACUTE ONLY) min 2x/week Treatment Duration 2 weeks     02/24/2022   3:14 PM Oral Phase Oral Phase Impaired Oral - Honey Teaspoon WFL;Piecemeal swallowing Oral - Honey Cup [facility];Piecemeal swallowing Oral - Nectar Teaspoon WFL Oral - Nectar Cup Piecemeal swallowing Oral - Thin Teaspoon WFL Oral - Thin Cup WFL;Piecemeal swallowing Oral - Thin Straw WFL;Piecemeal swallowing Oral - Puree Delayed oral transit;Piecemeal swallowing Oral - Mech Soft  Piecemeal swallowing;WFL    02/24/2022   3:15 PM Pharyngeal Phase Pharyngeal Phase Impaired Pharyngeal- Honey Teaspoon Penetration/Aspiration during swallow;Reduced airway/laryngeal closure Pharyngeal Material enters airway, CONTACTS cords and then ejected out Pharyngeal- Honey Cup WFL;Reduced airway/laryngeal closure Pharyngeal Material does not enter airway Pharyngeal- Nectar Teaspoon Penetration/Aspiration during swallow;Trace aspiration;Reduced airway/laryngeal closure;Inter-arytenoid space residue Pharyngeal Material enters airway, passes BELOW cords without attempt by patient to eject out (silent aspiration);Material enters airway, passes BELOW cords and not ejected out despite cough attempt by patient Pharyngeal- Nectar Cup Reduced airway/laryngeal closure;Penetration/Aspiration during swallow;Inter-arytenoid space residue;Delayed swallow initiation-pyriform sinuses Pharyngeal Material enters airway, passes BELOW cords without attempt by patient to eject out (silent aspiration) Pharyngeal- Thin Teaspoon Reduced airway/laryngeal closure;Inter-arytenoid space residue Pharyngeal Material  enters airway, passes BELOW cords without attempt by patient to eject out (silent aspiration) Pharyngeal- Thin Cup Reduced airway/laryngeal closure;Penetration/Aspiration during swallow;Moderate aspiration;Inter-arytenoid space residue Pharyngeal Material enters airway, passes BELOW cords without attempt by patient to eject out (silent aspiration) Pharyngeal- Thin Straw Inter-arytenoid space residue;Reduced airway/laryngeal closure;Moderate aspiration Pharyngeal Material enters airway, passes BELOW cords without attempt by patient to eject out (silent aspiration) Pharyngeal- Puree Delayed swallow initiation-vallecula Pharyngeal Material does not enter airway Pharyngeal- Mechanical Soft Pharyngeal residue - valleculae Pharyngeal Material does not enter airway Pharyngeal Comment Chin Tuck did not improve airway protection worsened  airway protection as it spilled boluses into posterior trachea, Pt currently has poor laryngeal closure allowing penetration and aspiration of liquids consistently in posterior trachea.  Edema is likely source and hopeful for improvement as pt continues to medically improve with time for intubation related edema to abate.    02/24/2022   3:27 PM Cervical Esophageal Phase  Cervical Esophageal Phase Impaired Cervical Esophageal Comment Mild retention of barium and secretions pooled at UES -  Solids/pudding transit through cervical esophagus better than liquids - presumed due to improved traction from bolus amount allowing improved muscular contraction. Chales Abrahams 02/24/2022, 3:49 PM  Rolena Infante, MS [facility] SLP Acute Rehab Services Office [phone] Pager [phone]                        Thad Ranger M.D. [facility] 02/25/2022, 11:59 AM  Available via Epic secure chat 7am-7pm After 7 pm, please refer to night coverage provider listed on amion.

## 2022-02-25 NOTE — Progress Notes (Addendum)
Nutrition Follow-up  DOCUMENTATION CODES:   Morbid obesity  INTERVENTION:   -Ensure Plus High Protein po TID, each supplement provides 350 kcal and 20 grams of protein.   -Magic cup TID with meals, each supplement provides 290 kcal and 9 grams of protein   -Multivitamin with minerals daily -crushed  NUTRITION DIAGNOSIS:   Inadequate oral intake related to inability to eat as evidenced by NPO status (on vent).  Now on dysphagia 3 diet.  GOAL:   Patient will meet greater than or equal to 90% of their needs  Progressing.  MONITOR:   PO intake, Supplement acceptance, Labs, Weight trends, I & O's  ASSESSMENT:   37 y.o. 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  12/31: Admit 1/1: Intubated 1/2: Started Pivot 1.5 at 81mL/hr 1/5: TF @ 30mL/hr, plan to maintain current rate and assess K+ trends 1/9: extubated, OGT removed 1/10: remained NPO 1/11: s/p MBS, diet advanced to dysphagia 3 with honey thick liquids  Patient declined NGT placement. SLP evaluated 1/11 and recommended dysphagia 3 diet with honey thick liquids. Pt has taken in liquids today. Will order Ensure supplements given poor PO and limited nutrition since OGT and extubation on 1/9. Noted K labs are trending down.   Admission weight: 369 lbs Current weight: 325 lbs  Medications: Colace, KLOR-CON, D5 infusion  Labs reviewed: CBGs: 105-110 Low K  Diet Order:   Diet Order             DIET DYS 3 Room service appropriate? Yes; Fluid consistency: Honey Thick  Diet effective now                   EDUCATION NEEDS:   Not appropriate for education at this time  Skin:  Skin Assessment: Reviewed RN Assessment Skin Integrity Issues:: Other (Comment) Other: (ITD) Intertriginous  Dermatitis on Abdomen  Last BM:  1/11 -type 7  Height:   Ht Readings from Last 1 Encounters:  02/17/22 5\' 9"  (1.753 m)    Weight:   Wt Readings from Last 1 Encounters:  02/23/22 (!) 147.7 kg    BMI:  Body mass index is 48.09 kg/m.  Estimated Nutritional Needs:   Kcal:  2100-2550 kcals  Protein:  130-145 grams  Fluid:  >/= 2.1L   Clayton Bibles, MS, RD, LDN Inpatient Clinical Dietitian Contact information available via Amion

## 2022-02-25 NOTE — Progress Notes (Signed)
Physical Therapy Treatment Patient Details Name: Alice [name] MRN: [id number] DOB: 1985-05-06 Today's Date: 02/25/2022   History of Present Illness Patient is a 37 y.o. female presented to the emergency department with nausea vomiting diarrhea and a home flu test that was positive. Pt with multiple recent hospitalizations at outside hospitals. Pt at admitted to [facility] 12/11/21-12/30/21 with cellulitis and Candida intertrigo and pneumonia with sepsis treated with IV antibiotics discharged on a 10-day door course of doxycycline.Then pt admitted to [facility] from 12/10-12/16 for metabolic acidosis and ulcerative lesions on the inner thighs suspected to fungal intertrigo. Pt's sister reports pt struggling to mobilize once home and had a fall at home and brought to [facility], the patient was intubated and placed on mechanical life support prior to transfer. 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    PT Comments    General Comments: AxO x 3 c/o "poor sleep last night" and "was hoping to sleep all day".  Pt cooperative.  Voice is still "weak". Pt appears sleepy. Assisted OOB showing some improvement.  General bed mobility comments: MUCH improved bed mobility with pt ona regular bed vs Air.  Pt able to long sit at [employer].  Required Max Asisst for scooting using bed pad and Mod Assist to guide B LE off bed.  General transfer comment: Much improved ability to stand from regular bed vs air bed.  Pt stood three times at Mod/Min Assist + 2 for safety.  First time, pt performed static standing approx 30 seconds at EOB.  Second time pt was able to rise at [facility] then attempted to perform "marching in place" pt buckled back to seated EOB.  Third attempt pt was able to take a few pivot steps from bed to recliner with walker and  instructions to "keep knee locked". Positioned in recliner then performed some B LE TE's LAQ's, marching as well as trunk rotation and lateral side to side leaning. Left pt in recliner and reported to RN to "still use MAXI MOVE LIFT" to assist back to bed.     Recommendations for follow up therapy are one component of a multi-disciplinary discharge planning process, led by the attending physician.  Recommendations may be updated based on patient status, additional functional criteria and insurance authorization.  Follow Up Recommendations  Skilled nursing-short term rehab (<3 hours/day) Can patient physically be transported by private vehicle: No   Assistance Recommended at Discharge Frequent or constant Supervision/Assistance  Patient can return home with the following Two people to help with walking and/or transfers;Two people to help with bathing/dressing/bathroom;Assistance with cooking/housework;Assistance with feeding;Direct supervision/assist for medications management;Assist for transportation;Help with stairs or ramp for entrance   Equipment Recommendations       Recommendations for Other Services       Precautions / Restrictions Precautions Precautions: Fall Precaution Comments: wounds at panus and EDEMA throughout Restrictions Weight Bearing Restrictions: No     Mobility  Bed Mobility Overal bed mobility: Needs Assistance             General bed mobility comments: MUCH improved bed mobility with pt ona regular bed vs Air.  Pt able to long sit at [employer].  Required Max Asisst for scooting using bed pad and Mod Assist to guide B LE off bed.    Transfers Overall transfer level: Needs assistance Equipment  used: Conservation officer, nature (2 wheels) Angelia Mould) Transfers: Sit to/from Stand, [facility] to chair/wheelchair/BSC     Step pivot transfers: Min assist, Mod assist, +2 safety/equipment, From elevated surface       General transfer comment: Much improved ability  to stand from regular bed vs air bed.  Pt stood three times at Mod/Min Assist + 2 for safety.  First time, pt performed static standing approx 30 seconds at EOB.  Second time pt was able to rise at [facility] then attempted to perform "marching in place" pt buckled back to seated EOB.  Third attempt pt was able to take a few pivot steps from bed to recliner with walker and instructions to "keep knee locked".    Ambulation/Gait               General Gait Details: pre gait transfers performed today   Stairs             Wheelchair Mobility    Modified Rankin (Stroke Patients Only)       Balance                                            Cognition Arousal/Alertness: Awake/alert Behavior During Therapy: WFL for tasks assessed/performed Overall Cognitive Status: Within Functional Limits for tasks assessed                                 General Comments: AxO x 3 c/o "poor sleep last night" and "was hoping to sleep all day".  Pt cooperative.  Voice is still "weak".        Exercises      General Comments        Pertinent Vitals/Pain Pain Assessment Pain Assessment: No/denies pain    Home Living                          Prior Function            PT Goals (current goals can now be found in the care plan section) Progress towards PT goals: Progressing toward goals    Frequency    Min 3X/week      PT Plan Current plan remains appropriate    Co-evaluation              AM-PAC PT "6 Clicks" Mobility   Outcome Measure  Help needed turning from your back to your side while in a flat bed without using bedrails?: A Lot Help needed moving from lying on your back to sitting on the side of a flat bed without using bedrails?: A Lot Help needed moving to and from a bed to a chair (including a wheelchair)?: A Lot Help needed standing up from a chair using your arms (e.g., wheelchair or bedside chair)?: A  Lot Help needed to walk in hospital room?: Total Help needed climbing 3-5 steps with a railing? : Total 6 Click Score: 10    End of Session Equipment Utilized During Treatment: Gait belt Activity Tolerance: Patient limited by fatigue;Treatment limited secondary to medical complications (Comment) (s/p VDRF) Patient left: in chair;with call bell/phone within reach Nurse Communication: Mobility status PT Visit Diagnosis: Unsteadiness on feet (R26.81);Other abnormalities of gait and mobility (R26.89);Muscle weakness (generalized) (M62.81)     Time: 8119-1478 PT Time Calculation (  min) (ACUTE ONLY): 27 min  Charges:  $Therapeutic Exercise: 8-22 mins $Therapeutic Activity: 8-22 mins                     Rica Koyanagi  PTA Acute  Rehabilitation Services Office M-F          [phone] Weekend pager [phone]

## 2022-02-26 DIAGNOSIS — J189 Pneumonia, unspecified organism: Secondary | ICD-10-CM | POA: Diagnosis not present

## 2022-02-26 DIAGNOSIS — N179 Acute kidney failure, unspecified: Secondary | ICD-10-CM | POA: Diagnosis not present

## 2022-02-26 DIAGNOSIS — R1084 Generalized abdominal pain: Secondary | ICD-10-CM | POA: Diagnosis not present

## 2022-02-26 DIAGNOSIS — R112 Nausea with vomiting, unspecified: Secondary | ICD-10-CM | POA: Diagnosis not present

## 2022-02-26 LAB — CBC
HCT: 30 % — ABNORMAL LOW (ref 36.0–46.0)
Hemoglobin: 9.2 g/dL — ABNORMAL LOW (ref 12.0–15.0)
MCH: 30.5 pg (ref 26.0–34.0)
MCHC: 30.7 g/dL (ref 30.0–36.0)
MCV: 99.3 fL (ref 80.0–100.0)
Platelets: 451 10*3/uL — ABNORMAL HIGH (ref 150–400)
RBC: 3.02 MIL/uL — ABNORMAL LOW (ref 3.87–5.11)
RDW: 18 % — ABNORMAL HIGH (ref 11.5–15.5)
WBC: 9.3 10*3/uL (ref 4.0–10.5)
nRBC: 0 % (ref 0.0–0.2)

## 2022-02-26 LAB — BASIC METABOLIC PANEL
Anion gap: 8 (ref 5–15)
BUN: 11 mg/dL (ref 6–20)
CO2: 15 mmol/L — ABNORMAL LOW (ref 22–32)
Calcium: 8.1 mg/dL — ABNORMAL LOW (ref 8.9–10.3)
Chloride: 112 mmol/L — ABNORMAL HIGH (ref 98–111)
Creatinine, Ser: 0.55 mg/dL (ref 0.44–1.00)
GFR, Estimated: >60 mL/min (ref >60)
Glucose, Bld: 98 mg/dL (ref 70–99)
Potassium: 3.5 mmol/L (ref 3.5–5.1)
Sodium: 135 mmol/L (ref 135–145)

## 2022-02-26 MED ORDER — LEVALBUTEROL HCL 0.63 MG/3ML IN NEBU
0.6300 mg | INHALATION_SOLUTION | Freq: Three times a day (TID) | RESPIRATORY_TRACT | Status: DC
  Administered 2022-02-26 – 2022-02-28 (×8): 0.63 mg via RESPIRATORY_TRACT
  Filled 2022-02-26 (×8): qty 3

## 2022-02-26 MED ORDER — METOPROLOL TARTRATE 25 MG PO TABS
25.0000 mg | ORAL_TABLET | Freq: Two times a day (BID) | ORAL | Status: DC
  Administered 2022-02-26 – 2022-03-03 (×11): 25 mg via ORAL
  Filled 2022-02-26 (×11): qty 1

## 2022-02-26 NOTE — Progress Notes (Signed)
[facility]                                                                              Alice [name], is a 37 y.o. 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 Amalia Hailey at [facility].  She presented to ED with nausea vomiting diarrhea and home flu test which was positive.  Patient was recently admitted to [facility] 12/10-12/16 for non-anion gap metabolic acidosis, ulcerative lesions on the inner thighs suspected due to fungal intertrigo.  Seen by dermatology and did not feel the patient had pyoderma gangrenosum.  She was treated with fluconazole and discharged with 7 more days of fluconazole.  She was recommended wound care with Aquacel Ag.  Previously was admitted at [facility] on 12/11/2021-12/30/2021 with cellulitis and candidal intertrigo and pneumonia, was treated with IV antibiotics and discharged on a 10-day course of doxycycline.  In ED, was noted to be hypothermic with temp 94 F, tachycardiac, tachypneic.  Sodium 133 creatinine 2.25.  Lactic acid 3.2. Influenza B+.  COVID RSV and influenza A negative. ABG showed pH of 7.05, pCO2 30, pO2 81, bicarb 8.  WBC count 18,000.  Patient was intubated admitted to ICU.  Chest x-ray showed bilateral interstitial infiltrates  1/1 admitted with severe ARDS, influenza pneumonia. Proned with marked improvement in P: F ratio 1/2 supinated, O2 better, ventilation better, renal function stable but with AKI 1/5 remains on the ventilator, sedated.  Repeating potassium.  Off pressors 1/7 Fever/leukocytosis, started on empric abx with unclear source.  1/9 following more commands,  intermittent agitation, extubated, failed SLP, intermittent BiPAP 1/11: Transfer to the floor, TRH assumed care.  Failed SLP, NPO, plan for MBS -> passed, started on diet   Assessment & Plan    Principal Problem:   Multifocal pneumonia acute respiratory failure with hypoxia, ARDS Influenza B positive pneumonia with superimposed bacterial pneumonia -Patient was not added on admission, extubated on 1/9, currently O2 sats 91 to 93% on room air -Completed Tamiflu on 1/17 -Currently on IV meropenem, will continue -Doing well, O2 sats 99% on room air, tolerating diet overall improving -Continue flutter valve, incentive spirometry, mobilize with PT   Active problems Sepsis, suspected UTI  - tracheal aspirate with rare Candida albicans -UA showed large leukocytes, > 50 WBCs, rare bacteria, urine culture showed no growth -Continue IV meropenem   Acute kidney injury, non-anion gap metabolic acidosis Hypernatremia -Likely due to diarrhea, poor p.o. intake -Bicarb was discontinued and patient was started on D5 infusion by CCM on 1/10 -Passed MBS, started on dysphagia 3 diet, tolerating diet -Sodium improving, 135, creatinine improved to 0.5    Chronic Panniculitis  Open wound on abdomen  Chronic fungal intertrigo  -CT abdomen showed no soft tissue infection  or abscess -Continue meropenem -Per patient, wound has been improving, this has been a chronic issue going on for at least a year -Continue wound care and dressing changes.  Wound care is following.  Hyperammonemia -RUQ [place] with hepatic steatosis, -Ammonia level improved to 19.  Ammonia level 42 on 1/6 -Lactulose on hold due to diarrhea -Alert and oriented    Hypokalemia -Resolved  Hypomagnesemia, hypophosphatemia -Resolved  Protein calorie malnutrition -Improving, tolerating dysphagia 3 diet without any difficulty    Acute metabolic encephalopathy, delirium -Possibly due to polypharmacy, multiple psych medications and  PNA -Patient's sister had reported to PCCM about finding multiple empty bottles of ibuprofen at home.  Discussed in detail with the patient, she reported that she may have had empty old bottles of ibuprofen at home however she did not take them, no suicidal ideation. -Alert and oriented x 3, appears now at baseline - She is on prozac, gabapentin, olanzapine, suboxone, trazodone chronically, currently alert and oriented.  Delirium resolved  Hypertension -BP readings have been assessed at rehab, will start on metoprolol 25 mg twice daily -Nebs changed to Xopenex due to tachycardia     Obesity, Class III, BMI 40-49.9 (morbid obesity) (Turner) Estimated body mass index is 47.96 kg/m as calculated from the following:   Height as of this encounter: 5\' 9"  (1.753 m).   Weight as of this encounter: 147.3 kg.  Code Status: Full code DVT Prophylaxis:  SCDs Start: 02/14/22 0002   Level of Care: Level of care: Progressive Family Communication: Updated patient's boyfriend at the bedside on 1/11.  Currently patient is alert and oriented x 3  Disposition Plan:      Remains inpatient appropriate: Pending SNF, possible dc on on 1/15 if bed available  Procedures:  Intubation, extubation MBS  Consultants:   Admitted by CCM  Antimicrobials:   Anti-infectives (From admission, onward)    Start     Dose/Rate Route Frequency Ordered Stop   02/23/22 1030  meropenem (MERREM) 1 g in sodium chloride 0.9 % 100 mL IVPB        1 g 200 mL/hr over 30 Minutes Intravenous Every 8 hours 02/23/22 1005 02/28/22 1029   02/20/22 1000  piperacillin-tazobactam (ZOSYN) IVPB 3.375 g  Status:  Discontinued        3.375 g 12.5 mL/hr over 240 Minutes Intravenous Every 8 hours 02/20/22 0910 02/23/22 0938   02/19/22 2200  oseltamivir (TAMIFLU) 6 MG/ML suspension 75 mg        75 mg Per Tube 2 times daily 02/19/22 0951 02/20/22 0928   02/19/22 1000  oseltamivir (TAMIFLU) 6 MG/ML suspension 75 mg  Status:  Discontinued         75 mg Per Tube 2 times daily 02/19/22 0835 02/19/22 0951   02/19/22 1000  ceFEPIme (MAXIPIME) 2 g in sodium chloride 0.9 % 100 mL IVPB  Status:  Discontinued        2 g 200 mL/hr over 30 Minutes Intravenous Every 8 hours 02/19/22 0856 02/20/22 0856   02/15/22 2200  oseltamivir (TAMIFLU) 6 MG/ML suspension 30 mg  Status:  Discontinued       See Hyperspace for full Linked Orders Report.   30 mg Per Tube 2 times daily 02/15/22 1008 02/15/22 1714   02/15/22 2200  oseltamivir (TAMIFLU) 6 MG/ML suspension 30 mg  Status:  Discontinued        30 mg Per Tube 2 times daily 02/15/22 1714 02/19/22 0835   02/15/22 1200  oseltamivir (TAMIFLU) 6  MG/ML suspension 75 mg       See Hyperspace for full Linked Orders Report.   75 mg Per Tube  Once 02/15/22 1008 02/15/22 1224   02/15/22 1200  linezolid (ZYVOX) IVPB 600 mg        600 mg 300 mL/hr over 60 Minutes Intravenous Every 12 hours 02/15/22 1016 02/21/22 2311   02/14/22 1000  oseltamivir (TAMIFLU) capsule 30 mg  Status:  Discontinued        30 mg Oral 2 times daily 02/13/22 2159 02/15/22 1008   02/14/22 0800  ceFEPIme (MAXIPIME) 2 g in sodium chloride 0.9 % 100 mL IVPB  Status:  Discontinued        2 g 200 mL/hr over 30 Minutes Intravenous Every 12 hours 02/13/22 1946 02/19/22 0856   02/13/22 2000  metroNIDAZOLE (FLAGYL) IVPB 500 mg  Status:  Discontinued        500 mg 100 mL/hr over 60 Minutes Intravenous Every 12 hours 02/13/22 1908 02/15/22 1019   02/13/22 1953  vancomycin variable dose per unstable renal function (pharmacist dosing)  Status:  Discontinued         Does not apply See admin instructions 02/13/22 1953 02/15/22 1016   02/13/22 1945  oseltamivir (TAMIFLU) capsule 75 mg  Status:  Discontinued        75 mg Oral  Once 02/13/22 1943 02/15/22 1008   02/13/22 1915  ceFEPIme (MAXIPIME) 2 g in sodium chloride 0.9 % 100 mL IVPB        2 g 200 mL/hr over 30 Minutes Intravenous  Once 02/13/22 1908 02/13/22 2008   02/13/22 1845  vancomycin  (VANCOCIN) 2,500 mg in sodium chloride 0.9 % 500 mL IVPB        2,500 mg 262.5 mL/hr over 120 Minutes Intravenous  Once 02/13/22 1835 02/13/22 2137   02/13/22 1845  piperacillin-tazobactam (ZOSYN) IVPB 3.375 g  Status:  Discontinued        3.375 g 100 mL/hr over 30 Minutes Intravenous  Once 02/13/22 1836 02/13/22 1937   02/13/22 1630  amoxicillin (AMOXIL) capsule 1,000 mg        1,000 mg Oral STAT 02/13/22 1625 02/13/22 1640          Medications  Chlorhexidine Gluconate Cloth  6 each Topical Daily   docusate sodium  100 mg Oral BID   enoxaparin (LOVENOX) injection  80 mg Subcutaneous Q24H   feeding supplement  237 mL Oral TID BM   guaiFENesin-dextromethorphan  15 mL Oral Q6H   influenza vac split quadrivalent PF  0.5 mL Intramuscular Tomorrow-1000   ipratropium-albuterol  3 mL Nebulization TID   multivitamin with minerals  1 tablet Oral Daily   mouth rinse  15 mL Mouth Rinse 4 times per day   pantoprazole (PROTONIX) IV  40 mg Intravenous Q24H   pneumococcal 20-valent conjugate vaccine  0.5 mL Intramuscular Tomorrow-1000      Subjective:   [place] was seen and examined today.  Alert and oriented, sitting up in the bed, eating breakfast.  Feels good today.  No nausea vomiting, chest pain, shortness of breath or abdominal pain.  Objective:   Vitals:   02/26/22 0500 02/26/22 0521 02/26/22 0807 02/26/22 1040  BP:  (!) 146/96  (!) 141/106  Pulse:  (!) 106  (!) 126  Resp:  20  20  Temp:  98.4 F (36.9 C)  97.6 F (36.4 C)  TempSrc:  Oral  Oral  SpO2:  98% 97% 99%  Weight: Marland Kitchen)  147.3 kg     Height:        Intake/Output Summary (Last 24 hours) at 02/26/2022 1319 Last data filed at 02/26/2022 0524 Gross per 24 hour  Intake 3316.22 ml  Output 500 ml  Net 2816.22 ml     Wt Readings from Last 3 Encounters:  02/26/22 (!) 147.3 kg  01/24/19 (!) 167.4 kg  11/02/18 (!) 164.7 kg   Physical Exam General: Alert and oriented x 3, NAD Cardiovascular: S1 S2 clear,  RRR.  Respiratory: Fairly CTA B, no wheezing Gastrointestinal: Soft, nontender, nondistended, NBS Ext: no pedal edema bilaterally Neuro: no new deficits Skin: Open abdominal wound with pancolitis, improving Psych: Normal affect and demeanor, alert and oriented x3      Data Reviewed:  I have personally reviewed following labs    CBC Lab Results  Component Value Date   WBC 9.3 02/26/2022   RBC 3.02 (L) 02/26/2022   HGB 9.2 (L) 02/26/2022   HCT 30.0 (L) 02/26/2022   MCV 99.3 02/26/2022   MCH 30.5 02/26/2022   PLT 451 (H) 02/26/2022   MCHC 30.7 02/26/2022   RDW 18.0 (H) 02/26/2022   LYMPHSABS 2.7 02/22/2022   MONOABS 2.0 (H) 02/22/2022   EOSABS 0.0 02/22/2022   BASOSABS 0.0 02/22/2022     Last metabolic panel Lab Results  Component Value Date   NA 135 02/26/2022   K 3.5 02/26/2022   CL 112 (H) 02/26/2022   CO2 15 (L) 02/26/2022   BUN 11 02/26/2022   CREATININE 0.55 02/26/2022   GLUCOSE 98 02/26/2022   GFRNONAA >60 02/26/2022   GFRAA 93 11/02/2018   CALCIUM 8.1 (L) 02/26/2022   PHOS 3.7 02/23/2022   PROT 5.5 (L) 02/21/2022   ALBUMIN 1.8 (L) 02/21/2022   LABGLOB 2.7 11/02/2018   AGRATIO 1.6 11/02/2018   BILITOT 0.5 02/21/2022   ALKPHOS 120 02/21/2022   AST 20 02/21/2022   ALT 15 02/21/2022   ANIONGAP 8 02/26/2022    CBG (last 3)  No results for input(s): "GLUCAP" in the last 72 hours.     Coagulation Profile: No results for input(s): "INR", "PROTIME" in the last 168 hours.   Radiology Studies: I have personally reviewed the imaging studies  No results found.     Thad Ranger M.D. [facility] 02/26/2022, 1:19 PM  Available via Epic secure chat 7am-7pm After 7 pm, please refer to night coverage provider listed on amion.

## 2022-02-27 DIAGNOSIS — R112 Nausea with vomiting, unspecified: Secondary | ICD-10-CM | POA: Diagnosis not present

## 2022-02-27 DIAGNOSIS — J189 Pneumonia, unspecified organism: Secondary | ICD-10-CM | POA: Diagnosis not present

## 2022-02-27 DIAGNOSIS — N179 Acute kidney failure, unspecified: Secondary | ICD-10-CM | POA: Diagnosis not present

## 2022-02-27 DIAGNOSIS — R1084 Generalized abdominal pain: Secondary | ICD-10-CM | POA: Diagnosis not present

## 2022-02-27 LAB — BASIC METABOLIC PANEL
Anion gap: 7 (ref 5–15)
BUN: 14 mg/dL (ref 6–20)
CO2: 17 mmol/L — ABNORMAL LOW (ref 22–32)
Calcium: 8 mg/dL — ABNORMAL LOW (ref 8.9–10.3)
Chloride: 111 mmol/L (ref 98–111)
Creatinine, Ser: 0.6 mg/dL (ref 0.44–1.00)
GFR, Estimated: >60 mL/min (ref >60)
Glucose, Bld: 90 mg/dL (ref 70–99)
Potassium: 2.8 mmol/L — ABNORMAL LOW (ref 3.5–5.1)
Sodium: 135 mmol/L (ref 135–145)

## 2022-02-27 LAB — MAGNESIUM: Magnesium: 1.6 mg/dL — ABNORMAL LOW (ref 1.7–2.4)

## 2022-02-27 MED ORDER — POTASSIUM CHLORIDE CRYS ER 20 MEQ PO TBCR
40.0000 meq | EXTENDED_RELEASE_TABLET | ORAL | Status: AC
  Administered 2022-02-27 (×2): 40 meq via ORAL
  Filled 2022-02-27 (×2): qty 2

## 2022-02-27 NOTE — Progress Notes (Addendum)
[facility]                                                                              Alice [name], is a 37 y.o. 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 [name] at [facility].  She presented to ED with nausea vomiting diarrhea and home flu test which was positive.  Patient was recently admitted to [facility] 12/10-12/16 for non-anion gap metabolic acidosis, ulcerative lesions on the inner thighs suspected due to fungal intertrigo.  Seen by dermatology and did not feel the patient had pyoderma gangrenosum.  She was treated with fluconazole and discharged with 7 more days of fluconazole.  She was recommended wound care with Aquacel Ag.  Previously was admitted at [facility] on 12/11/2021-12/30/2021 with cellulitis and candidal intertrigo and pneumonia, was treated with IV antibiotics and discharged on a 10-day course of doxycycline.  In ED, was noted to be hypothermic with temp 94 F, tachycardiac, tachypneic.  Sodium 133 creatinine 2.25.  Lactic acid 3.2. Influenza B+.  COVID RSV and influenza A negative. ABG showed pH of 7.05, pCO2 30, pO2 81, bicarb 8.  WBC count 18,000.  Patient was intubated admitted to ICU.  Chest x-ray showed bilateral interstitial infiltrates  1/1 admitted with severe ARDS, influenza pneumonia. Proned with marked improvement in P: F ratio 1/2 supinated, O2 better, ventilation better, renal function stable but with AKI 1/5 remains on the ventilator, sedated.  Repeating potassium.  Off pressors 1/7 Fever/leukocytosis, started on empric abx with unclear source.  1/9 following more commands,  intermittent agitation, extubated, failed SLP, intermittent BiPAP 1/11: Transfer to the floor, TRH assumed care.  Failed SLP, NPO, plan for MBS -> passed, started on diet   Assessment & Plan    Principal Problem:   Multifocal pneumonia acute respiratory failure with hypoxia, ARDS Influenza B positive pneumonia with superimposed bacterial pneumonia -Intubated on admission, extubated on 1/9 -Completed Tamiflu on 1/17 -Currently on IV meropenem, will continue -Continue flutter valve, incentive spirometry, mobilize with PT -Improving, O2 sats 96% on room air  Active problems Sepsis, suspected UTI  - tracheal aspirate with rare Candida albicans -UA showed large leukocytes, > 50 WBCs, rare bacteria, urine culture showed no growth -Continue IV meropenem   Acute kidney injury, non-anion gap metabolic acidosis Hypernatremia -Likely due to diarrhea, poor p.o. intake -Bicarb was discontinued and patient was started on D5 infusion by CCM on 1/10 -Passed MBS, started on dysphagia 3 diet, tolerating diet -Sodium improving, 135, creatinine improved to 0.5  Hypokalemia -Potassium 2.8, replaced p.o.   Chronic Panniculitis  Open wound on abdomen  Chronic fungal intertrigo  -CT abdomen showed no soft tissue infection or abscess -Continue meropenem -Per patient, wound has been improving, this has been  a chronic issue going on for at least a year -Continue wound care and dressing changes.  Wound care is following.  Hyperammonemia -RUQ [place] with hepatic steatosis, -Ammonia level improved to 19.  Ammonia level 42 on 1/6 -Lactulose on hold due to diarrhea     Hypomagnesemia, hypophosphatemia -Will obtain magnesium level.  Protein calorie malnutrition -Tolerating diet doubt any difficulty, improving    Acute metabolic encephalopathy, delirium -Possibly due to polypharmacy, multiple psych medications and PNA -Patient's sister had reported to PCCM about finding multiple empty bottles of  ibuprofen at home.  Discussed in detail with the patient 1/13, she reported that she may have had empty old bottles of ibuprofen at home however she did not take them, no suicidal ideation. - She is on prozac, gabapentin, olanzapine, suboxone, trazodone chronically, continue to hold off.  Delirium has resolved.    Hypertension -Nebs changed to Xopenex due to tachycardia -BP improving, continue Lopressor    Obesity, Class III, BMI 40-49.9 (morbid obesity) ([place]) Estimated body mass index is 48.35 kg/m as calculated from the following:   Height as of this encounter: 5\' 9"  (1.753 m).   Weight as of this encounter: 148.5 kg.  Code Status: Full code DVT Prophylaxis:  SCDs Start: 02/14/22 0002   Level of Care: Level of care: Progressive Family Communication:   Updated patient's sister, Letha Cape on phone today  Disposition Plan:      Remains inpatient appropriate: Pending SNF, possible dc on on 1/15 if bed available  Procedures:  Intubation, extubation MBS  Consultants:   Admitted by CCM  Antimicrobials:   Anti-infectives (From admission, onward)    Start     Dose/Rate Route Frequency Ordered Stop   02/23/22 1030  meropenem (MERREM) 1 g in sodium chloride 0.9 % 100 mL IVPB        1 g 200 mL/hr over 30 Minutes Intravenous Every 8 hours 02/23/22 1005 02/28/22 1029   02/20/22 1000  piperacillin-tazobactam (ZOSYN) IVPB 3.375 g  Status:  Discontinued        3.375 g 12.5 mL/hr over 240 Minutes Intravenous Every 8 hours 02/20/22 0910 02/23/22 0938   02/19/22 2200  oseltamivir (TAMIFLU) 6 MG/ML suspension 75 mg        75 mg Per Tube 2 times daily 02/19/22 0951 02/20/22 0928   02/19/22 1000  oseltamivir (TAMIFLU) 6 MG/ML suspension 75 mg  Status:  Discontinued        75 mg Per Tube 2 times daily 02/19/22 0835 02/19/22 0951   02/19/22 1000  ceFEPIme (MAXIPIME) 2 g in sodium chloride 0.9 % 100 mL IVPB  Status:  Discontinued        2 g 200 mL/hr over 30 Minutes Intravenous Every 8 hours  02/19/22 0856 02/20/22 0856   02/15/22 2200  oseltamivir (TAMIFLU) 6 MG/ML suspension 30 mg  Status:  Discontinued       See Hyperspace for full Linked Orders Report.   30 mg Per Tube 2 times daily 02/15/22 1008 02/15/22 1714   02/15/22 2200  oseltamivir (TAMIFLU) 6 MG/ML suspension 30 mg  Status:  Discontinued        30 mg Per Tube 2 times daily 02/15/22 1714 02/19/22 0835   02/15/22 1200  oseltamivir (TAMIFLU) 6 MG/ML suspension 75 mg       See Hyperspace for full Linked Orders Report.   75 mg Per Tube  Once 02/15/22 1008 02/15/22 1224   02/15/22 1200  linezolid (ZYVOX) IVPB 600 mg  600 mg 300 mL/hr over 60 Minutes Intravenous Every 12 hours 02/15/22 1016 02/21/22 2311   02/14/22 1000  oseltamivir (TAMIFLU) capsule 30 mg  Status:  Discontinued        30 mg Oral 2 times daily 02/13/22 2159 02/15/22 1008   02/14/22 0800  ceFEPIme (MAXIPIME) 2 g in sodium chloride 0.9 % 100 mL IVPB  Status:  Discontinued        2 g 200 mL/hr over 30 Minutes Intravenous Every 12 hours 02/13/22 1946 02/19/22 0856   02/13/22 2000  metroNIDAZOLE (FLAGYL) IVPB 500 mg  Status:  Discontinued        500 mg 100 mL/hr over 60 Minutes Intravenous Every 12 hours 02/13/22 1908 02/15/22 1019   02/13/22 1953  vancomycin variable dose per unstable renal function (pharmacist dosing)  Status:  Discontinued         Does not apply See admin instructions 02/13/22 1953 02/15/22 1016   02/13/22 1945  oseltamivir (TAMIFLU) capsule 75 mg  Status:  Discontinued        75 mg Oral  Once 02/13/22 1943 02/15/22 1008   02/13/22 1915  ceFEPIme (MAXIPIME) 2 g in sodium chloride 0.9 % 100 mL IVPB        2 g 200 mL/hr over 30 Minutes Intravenous  Once 02/13/22 1908 02/13/22 2008   02/13/22 1845  vancomycin (VANCOCIN) 2,500 mg in sodium chloride 0.9 % 500 mL IVPB        2,500 mg 262.5 mL/hr over 120 Minutes Intravenous  Once 02/13/22 1835 02/13/22 2137   02/13/22 1845  piperacillin-tazobactam (ZOSYN) IVPB 3.375 g  Status:   Discontinued        3.375 g 100 mL/hr over 30 Minutes Intravenous  Once 02/13/22 1836 02/13/22 1937   02/13/22 1630  amoxicillin (AMOXIL) capsule 1,000 mg        1,000 mg Oral STAT 02/13/22 1625 02/13/22 1640          Medications  Chlorhexidine Gluconate Cloth  6 each Topical Daily   docusate sodium  100 mg Oral BID   enoxaparin (LOVENOX) injection  80 mg Subcutaneous Q24H   feeding supplement  237 mL Oral TID BM   guaiFENesin-dextromethorphan  15 mL Oral Q6H   influenza vac split quadrivalent PF  0.5 mL Intramuscular Tomorrow-1000   levalbuterol  0.63 mg Nebulization TID   metoprolol tartrate  25 mg Oral BID   multivitamin with minerals  1 tablet Oral Daily   mouth rinse  15 mL Mouth Rinse 4 times per day   pantoprazole (PROTONIX) IV  40 mg Intravenous Q24H   pneumococcal 20-valent conjugate vaccine  0.5 mL Intramuscular Tomorrow-1000      Subjective:   Alice [name] was seen and examined today.  Sleepy but no acute complaints.  Easily arousable.  No nausea vomiting, abdominal pain.  Tolerating diet.  No acute issues overnight.  BP better controlled  Objective:   Vitals:   02/27/22 0543 02/27/22 0737 02/27/22 1008 02/27/22 1214  BP: 133/81  135/83 (!) 143/86  Pulse: 79  93 86  Resp: 18   20  Temp: 98.5 F (36.9 C)   98.1 F (36.7 C)  TempSrc: Oral   Oral  SpO2: 98% 95%  96%  Weight: (!) 148.5 kg     Height:        Intake/Output Summary (Last 24 hours) at 02/27/2022 1252 Last data filed at 02/27/2022 1012 Gross per 24 hour  Intake 120 ml  Output 1900 ml  Net -1780 ml     Wt Readings from Last 3 Encounters:  02/27/22 (!) 148.5 kg  01/24/19 (!) 167.4 kg  11/02/18 (!) 164.7 kg   Physical Exam General: Alert and oriented x 3, NAD Cardiovascular: S1 S2 clear, RRR.  Respiratory: CTAB Gastrointestinal: Soft, nontender, nondistended, NBS Ext: no pedal edema bilaterally Neuro: no new deficits Skin: Abdominal wound with panniculitis  Psych: Normal affect       Data Reviewed:  I have personally reviewed following labs    CBC Lab Results  Component Value Date   WBC 9.3 02/26/2022   RBC 3.02 (L) 02/26/2022   HGB 9.2 (L) 02/26/2022   HCT 30.0 (L) 02/26/2022   MCV 99.3 02/26/2022   MCH 30.5 02/26/2022   PLT 451 (H) 02/26/2022   MCHC 30.7 02/26/2022   RDW 18.0 (H) 02/26/2022   LYMPHSABS 2.7 02/22/2022   MONOABS 2.0 (H) 02/22/2022   EOSABS 0.0 02/22/2022   BASOSABS 0.0 93/71/6967     Last metabolic panel Lab Results  Component Value Date   NA 135 02/27/2022   K 2.8 (L) 02/27/2022   CL 111 02/27/2022   CO2 17 (L) 02/27/2022   BUN 14 02/27/2022   CREATININE 0.60 02/27/2022   GLUCOSE 90 02/27/2022   GFRNONAA >60 02/27/2022   GFRAA 93 11/02/2018   CALCIUM 8.0 (L) 02/27/2022   PHOS 3.7 02/23/2022   PROT 5.5 (L) 02/21/2022   ALBUMIN 1.8 (L) 02/21/2022   LABGLOB 2.7 11/02/2018   AGRATIO 1.6 11/02/2018   BILITOT 0.5 02/21/2022   ALKPHOS 120 02/21/2022   AST 20 02/21/2022   ALT 15 02/21/2022   ANIONGAP 7 02/27/2022    CBG (last 3)  No results for input(s): "GLUCAP" in the last 72 hours.     Coagulation Profile: No results for input(s): "INR", "PROTIME" in the last 168 hours.   Radiology Studies: I have personally reviewed the imaging studies  No results found.     Estill Cotta M.D. [facility] 02/27/2022, 12:52 PM  Available via Epic secure chat 7am-7pm After 7 pm, please refer to night coverage provider listed on amion.

## 2022-02-27 NOTE — Progress Notes (Signed)
Have been encouraging patient to sit up in the chair. Patient stated she does not want to get up yet.  Patient noted drinking thin liquids, reminded patient she is on honey thick liquid, family at the bedside also informed.  Family stated they were told she is on regular liquid, reinforced that per written order she is on thickened liq. Water/ice post oral care. They continue going out and getting ice and adding in all of patient's drink after educating

## 2022-02-28 DIAGNOSIS — J189 Pneumonia, unspecified organism: Secondary | ICD-10-CM | POA: Diagnosis not present

## 2022-02-28 DIAGNOSIS — R112 Nausea with vomiting, unspecified: Secondary | ICD-10-CM | POA: Diagnosis not present

## 2022-02-28 DIAGNOSIS — N179 Acute kidney failure, unspecified: Secondary | ICD-10-CM | POA: Diagnosis not present

## 2022-02-28 DIAGNOSIS — R1084 Generalized abdominal pain: Secondary | ICD-10-CM | POA: Diagnosis not present

## 2022-02-28 LAB — MAGNESIUM: Magnesium: 2.1 mg/dL (ref 1.7–2.4)

## 2022-02-28 LAB — BASIC METABOLIC PANEL
Anion gap: 6 (ref 5–15)
BUN: 13 mg/dL (ref 6–20)
CO2: 18 mmol/L — ABNORMAL LOW (ref 22–32)
Calcium: 8.3 mg/dL — ABNORMAL LOW (ref 8.9–10.3)
Chloride: 113 mmol/L — ABNORMAL HIGH (ref 98–111)
Creatinine, Ser: 0.69 mg/dL (ref 0.44–1.00)
GFR, Estimated: >60 mL/min (ref >60)
Glucose, Bld: 94 mg/dL (ref 70–99)
Potassium: 3.5 mmol/L (ref 3.5–5.1)
Sodium: 137 mmol/L (ref 135–145)

## 2022-02-28 LAB — CBC
HCT: 31.2 % — ABNORMAL LOW (ref 36.0–46.0)
Hemoglobin: 9.5 g/dL — ABNORMAL LOW (ref 12.0–15.0)
MCH: 30.6 pg (ref 26.0–34.0)
MCHC: 30.4 g/dL (ref 30.0–36.0)
MCV: 100.6 fL — ABNORMAL HIGH (ref 80.0–100.0)
Platelets: 564 10*3/uL — ABNORMAL HIGH (ref 150–400)
RBC: 3.1 MIL/uL — ABNORMAL LOW (ref 3.87–5.11)
RDW: 18.5 % — ABNORMAL HIGH (ref 11.5–15.5)
WBC: 9.2 10*3/uL (ref 4.0–10.5)
nRBC: 0 % (ref 0.0–0.2)

## 2022-02-28 MED ORDER — OXYCODONE HCL 5 MG PO TABS
5.0000 mg | ORAL_TABLET | Freq: Four times a day (QID) | ORAL | Status: DC | PRN
  Administered 2022-02-28 – 2022-03-03 (×5): 5 mg via ORAL
  Filled 2022-02-28 (×5): qty 1

## 2022-02-28 MED ORDER — MAGNESIUM SULFATE IN D5W 1-5 GM/100ML-% IV SOLN
1.0000 g | Freq: Once | INTRAVENOUS | Status: AC
  Administered 2022-02-28: 1 g via INTRAVENOUS
  Filled 2022-02-28: qty 100

## 2022-02-28 MED ORDER — METHOCARBAMOL 500 MG PO TABS: 500.0000 mg | ORAL_TABLET | Freq: Three times a day (TID) | ORAL | Status: DC | PRN

## 2022-02-28 MED ORDER — LEVALBUTEROL HCL 0.63 MG/3ML IN NEBU: 0.6300 mg | INHALATION_SOLUTION | Freq: Four times a day (QID) | RESPIRATORY_TRACT | Status: DC | PRN

## 2022-02-28 MED ORDER — GABAPENTIN 100 MG PO CAPS
100.0000 mg | ORAL_CAPSULE | Freq: Two times a day (BID) | ORAL | Status: DC
  Administered 2022-02-28 – 2022-03-03 (×7): 100 mg via ORAL
  Filled 2022-02-28 (×7): qty 1

## 2022-02-28 NOTE — TOC Progression Note (Signed)
Transition of Care [facility]) - Progression Note    Patient Details  Name: BRECKYN TROYER MRN: [id number] Date of Birth: June 12, 1985  Transition of Care [facility]) CM/SW Contact  Purcell Mouton, RN Phone Number: 02/28/2022, 9:57 AM  Clinical Narrative:     Phoebe Perch, PASRR completed. Pt was faxed to SNF. Waiting bed offers.     Barriers to Discharge: Continued Medical Work up  Expected Discharge Plan and Services In-house Referral: NA Discharge Planning Services: CM Consult Post Acute Care Choice: Durable Medical Equipment Living arrangements for the past 2 months: Apartment                                       Social Determinants of Health (SDOH) Interventions SDOH Screenings   Food Insecurity: No Food Insecurity (02/17/2022)  Housing: Low Risk  (02/17/2022)  Depression (PHQ2-9): Low Risk  (01/24/2019)  Recent Concern: Depression (PHQ2-9) - Medium Risk (12/09/2018)  Tobacco Use: High Risk (02/13/2022)    Readmission Risk Interventions     No data to display

## 2022-02-28 NOTE — TOC Progression Note (Signed)
Transition of Care [facility]) - Progression Note    Patient Details  Name: Alice [name] MRN: [id number] Date of Birth: 09/01/1985  Transition of Care [facility]) CM/SW Contact  Purcell Mouton, RN Phone Number: 02/28/2022, 12:35 PM  Clinical Narrative:     Spoke with pt concerning insurance not covering for SNF or HH. Pt asked that her sister Lattie Haw be called. Spoke with pt's sister Lattie Haw who states that she is a CNA, however, can not be there all the time to assist pt related to work. Lattie Haw continued to state that pt has no one to assist her at this time and will need assistance . Lattie Haw asked if hospital could expedite disability for pt. Explained to Lattie Haw, that the hospital do not do disability. Encouraged Lattie Haw to check with pt's SW with SS concerning pt's Medicaid. [employer] was emailed for assistance.     Barriers to Discharge: Continued Medical Work up  Expected Discharge Plan and Services In-house Referral: NA Discharge Planning Services: CM Consult Post Acute Care Choice: Durable Medical Equipment Living arrangements for the past 2 months: Apartment                                       Social Determinants of Health (SDOH) Interventions SDOH Screenings   Food Insecurity: No Food Insecurity (02/17/2022)  Housing: Low Risk  (02/17/2022)  Depression (PHQ2-9): Low Risk  (01/24/2019)  Recent Concern: Depression (PHQ2-9) - Medium Risk (12/09/2018)  Tobacco Use: High Risk (02/13/2022)    Readmission Risk Interventions     No data to display

## 2022-02-28 NOTE — Progress Notes (Signed)
SLP Cancellation Note  Patient Details Name: TENZIN PAVON MRN: [id number] DOB: 1985/04/17   Cancelled treatment:       Reason Eval/Treat Not Completed: Patient's level of consciousness. New order received for repeat swallow evaluation to determine if patient able to advance with solids and liquids. As patient had silent aspiration with thin and nectar thick liquids on initial MBS, 02/25/22, SLP recommending repeat MBS prior to any diet advancement. SLP checked on patient for readiness but upon entering her room, she was asleep in chair and unable to keep eyes open. Voice was very low in intensity. Patient would likely not maintain adequate alertness for repeat MBS today. SLP will f/u next date to determine readiness for MBS.  Sonia Baller, MA, CCC-SLP Speech Therapy

## 2022-02-28 NOTE — Progress Notes (Signed)
Physical Therapy Treatment Patient Details Name: Alice [name] MRN: [id number] DOB: [birth year] Today's Date: 02/28/2022   History of Present Illness Patient is a 37 y.o. female presented to the emergency department with nausea vomiting diarrhea and a home flu test that was positive. Pt with multiple recent hospitalizations at outside hospitals. Pt at admitted to [facility] 12/11/21-12/30/21 with cellulitis and Candida intertrigo and pneumonia with sepsis treated with IV antibiotics discharged on a 10-day door course of doxycycline.Then pt admitted to [facility] from 96/29-52/84 for metabolic acidosis and ulcerative lesions on the inner thighs suspected to fungal intertrigo. Pt's sister reports pt struggling to mobilize once home and had a fall at home and brought to [facility], the patient was intubated and placed on mechanical life support prior to transfer. 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    PT Comments    General Comments: AxO x 3 cooperative OOB in [place] sleepy/groggy/slow.  Assisted with amb in hallway.  General transfer comment: pt able to rise from [place] + 2 for safety.  General Gait Details: pt was able to amb 20 feet x 2 at [place] + 2 such that recliner was following.  Required one seated rest break due to weakness/fatigue. Pt getting OOB as well as using BSC with nursing.  Pt progressing but will need post Acute Rehab to address her weakness and decline in functional indep.   Consulted LPT if a more aggressive Rehab such as CIR would be appropriate.   Recommendations for follow up therapy are one component of a multi-disciplinary discharge planning process, led by the attending physician.  Recommendations may be updated based on patient status, additional functional criteria and insurance  authorization.  Follow Up Recommendations  Acute inpatient rehab (3hours/day) Can patient physically be transported by private vehicle: Yes   Assistance Recommended at Discharge Frequent or constant Supervision/Assistance  Patient can return home with the following Two people to help with walking and/or transfers;Two people to help with bathing/dressing/bathroom;Assistance with cooking/housework;Assistance with feeding;Direct supervision/assist for medications management;Assist for transportation;Help with stairs or ramp for entrance   Equipment Recommendations       Recommendations for Other Services Rehab consult     Precautions / Restrictions Precautions Precautions: Fall Precaution Comments: wounds at panus and EDEMA throughout Restrictions Weight Bearing Restrictions: No     Mobility  Bed Mobility               General bed mobility comments: OOB in recliner    Transfers Overall transfer level: Needs assistance Equipment used: Rolling walker (2 wheels) Transfers: Sit to/from Stand Sit to Stand: Min assist, +2 safety/equipment           General transfer comment: pt able to rise from recliner Min [place] + 2 for safety    Ambulation/Gait Ambulation/Gait assistance: Min assist Gait Distance (Feet): 40 Feet (20 feet x 2) Assistive device: Rolling walker (2 wheels) Gait Pattern/deviations: Step-to pattern, Decreased step length - left, Decreased step length - right Gait velocity: decreased     General Gait Details: pt was able to amb 20 feet x 2 at [employer] + 2 such that recliner was following.  Required one seated rest break due to weakness/fatigue.   Stairs             Wheelchair Mobility    Modified Rankin (  Stroke Patients Only)       Balance                                            Cognition Arousal/Alertness: Awake/alert Behavior During Therapy:  (kinda sleepy/groggy)                                    General Comments: AxO x 3 cooperative OOB in recliner kinda sleepy/groggy/slow        Exercises      General Comments        Pertinent Vitals/Pain Pain Assessment Pain Assessment: No/denies pain    Home Living                          Prior Function            PT Goals (current goals can now be found in the care plan section) Progress towards PT goals: Progressing toward goals    Frequency    Min 3X/week      PT Plan Current plan remains appropriate    Co-evaluation              AM-PAC PT "6 Clicks" Mobility   Outcome Measure  Help needed turning from your back to your side while in a flat bed without using bedrails?: A Lot Help needed moving from lying on your back to sitting on the side of a flat bed without using bedrails?: A Lot Help needed moving to and from a bed to a chair (including a wheelchair)?: A Lot Help needed standing up from a chair using your arms (e.g., wheelchair or bedside chair)?: A Lot Help needed to walk in hospital room?: A Lot Help needed climbing 3-5 steps with a railing? : A Lot 6 Click Score: 12    End of Session Equipment Utilized During Treatment: Gait belt Activity Tolerance: Patient limited by fatigue Patient left: in chair;with call bell/phone within reach Nurse Communication: Mobility status PT Visit Diagnosis: Unsteadiness on feet (R26.81);Other abnormalities of gait and mobility (R26.89);Muscle weakness (generalized) (M62.81)     Time: 4010-2725 PT Time Calculation (min) (ACUTE ONLY): 28 min  Charges:  $Gait Training: 8-22 mins $Therapeutic Activity: 8-22 mins                     Rica Koyanagi  PTA [place] Office M-F          [phone] Weekend pager [phone]

## 2022-02-28 NOTE — [place] FL2 (Signed)
[place] MEDICAID FL2 LEVEL OF CARE FORM     IDENTIFICATION  Patient Name: Alice [name] Birthdate: [birth year] Sex: female Admission Date (Current Location): 02/13/2022  [facility] [place] Number:  Herbalist and Address:  [facility],  [address] [place], [place]      Provider Number: [id number]  Attending Physician Name and Address:  Mendel Corning, MD  Relative Name and Phone Number:  Wess Botts [phone],  [id number] Sister [phone], [place] Significant other   [phone]    Current Level of Care: Hospital Recommended Level of Care: [place] Prior Approval Number:    Date Approved/Denied:   PASRR Number: [id number]  Discharge Plan: SNF    Current Diagnoses: Patient Active Problem List   Diagnosis Date Noted   Sepsis due to undetermined organism ([place]) 02/14/2022   Chronic abdominal wound infection 02/14/2022   Influenza B 02/14/2022   Acute respiratory failure with hypoxia ([place]) 02/14/2022   Hypothermia 02/14/2022   High anion gap metabolic acidosis 40/34/7425   Lactic acidosis 02/14/2022   Nausea & vomiting 02/14/2022   Diarrhea 02/14/2022   Hypokalemia 02/14/2022   Hyponatremia 02/14/2022   AKI (acute kidney injury) ([place]) 02/14/2022   Prolonged QT interval 02/14/2022   Hypoglycemia 95/63/8756   Metabolic acidosis 43/32/9518   Acute metabolic encephalopathy 84/16/6063   Multifocal pneumonia 02/13/2022   Migraine 12/12/2021   Panniculitis 12/12/2021   Bilateral pneumonia 12/12/2021   GAD (generalized anxiety disorder) 01/24/2019   GERD without esophagitis 07/13/2018   Intractable migraine without aura and without status migrainosus 07/13/2018   High risk medication use 07/13/2018   Calculus of gallbladder with acute and chronic cholecystitis with obstruction 06/19/2017   Ulnar neuropathy of left upper extremity 04/05/2017   Primary osteoarthritis of left knee 08/10/2015    Mood disorder ([place]) 12/30/2014   Smoker 05/13/2014   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   Single kidney 05/12/2014   Elevated liver function tests 09/24/2012   S/P left knee arthroscopy 07/23/2012   History of recurrent UTI (urinary tract infection) 07/23/2012   Obesity, Class III, BMI 40-49.9 (morbid obesity) ([place]) 07/23/2012    Orientation RESPIRATION BLADDER Height & Weight     Self, Time, Situation, Place  Normal External catheter, Incontinent Weight: (!) 151 kg Height:  5\' 9"  (175.3 cm)  BEHAVIORAL SYMPTOMS/MOOD NEUROLOGICAL BOWEL NUTRITION STATUS      Incontinent Diet (Soft diet)  AMBULATORY STATUS COMMUNICATION OF NEEDS Skin   Extensive Assist Verbally Other (Comment), Skin abrasions (Abdomen Open Wound with foam dressing, Abrasion bilateral abdomen, arms and hands.)                       Personal Care Assistance Level of Assistance  Bathing, Feeding, Dressing Bathing Assistance: Limited assistance Feeding assistance: Independent Dressing Assistance: Limited assistance     Functional Limitations Info  Sight, Hearing, Speech Sight Info: Impaired Hearing Info: Adequate Speech Info: Adequate    SPECIAL CARE FACTORS FREQUENCY  PT (By licensed PT), OT (By licensed OT)     PT Frequency: Eval and treat OT Frequency: Eval and treat            Contractures Contractures Info: Not present    Additional Factors Info  Code Status, Allergies Code Status Info: FULL Allergies Info: Cinnamon, Other, Reglan (Metoclopramide), Cinnamon Flavor, Codeine, Flomax (Tamsulosin), Ibuprofen, Levofloxacin, Tylenol (Acetaminophen)           Current Medications (02/28/2022):  This is the current hospital  active medication list Current Facility-Administered Medications  Medication Dose Route Frequency Provider Last Rate Last Admin   0.9 %  sodium chloride infusion   Intravenous PRN Hunsucker, Bonna Gains, MD   Stopped at 02/19/22 0415   Chlorhexidine Gluconate Cloth 2 % PADS  6 each  6 each Topical Daily Icard, Bradley L, DO   6 each at 02/27/22 1015   docusate sodium (COLACE) capsule 100 mg  100 mg Oral BID Maryjane Hurter, MD   100 mg at 02/26/22 2229   enoxaparin (LOVENOX) injection 80 mg  80 mg Subcutaneous Q24H Erenest Blank, RPH   80 mg at 02/27/22 1013   feeding supplement (ENSURE ENLIVE / ENSURE PLUS) liquid 237 mL  237 mL Oral TID BM Rai, Ripudeep K, MD   237 mL at 02/27/22 2147   guaiFENesin-dextromethorphan (ROBITUSSIN DM) 100-10 MG/5ML syrup 15 mL  15 mL Oral Q6H Maryjane Hurter, MD   15 mL at 02/27/22 0618   hydrALAZINE (APRESOLINE) injection 10 mg  10 mg Intravenous Q4H PRN Jennelle Human B, NP       influenza vac split quadrivalent PF (FLUARIX) injection 0.5 mL  0.5 mL Intramuscular Tomorrow-1000 Hunsucker, Bonna Gains, MD       levalbuterol Penne Lash) nebulizer solution 0.63 mg  0.63 mg Nebulization TID Rai, Ripudeep K, MD   0.63 mg at 02/28/22 0814   metoprolol tartrate (LOPRESSOR) tablet 25 mg  25 mg Oral BID Rai, Ripudeep K, MD   25 mg at 02/27/22 2146   multivitamin with minerals tablet 1 tablet  1 tablet Oral Daily Rai, Ripudeep K, MD   1 tablet at 02/27/22 1008   ondansetron (ZOFRAN) injection 4 mg  4 mg Intravenous Q6H PRN Adefeso, Oladapo, DO   4 mg at 02/28/22 0802   Oral care mouth rinse  15 mL Mouth Rinse PRN Hunsucker, Bonna Gains, MD       Oral care mouth rinse  15 mL Mouth Rinse 4 times per day Maryjane Hurter, MD   15 mL at 02/28/22 0756   pantoprazole (PROTONIX) injection 40 mg  40 mg Intravenous Q24H Hunsucker, Bonna Gains, MD   40 mg at 02/27/22 1008   pneumococcal 20-valent conjugate vaccine (PREVNAR 20) injection 0.5 mL  0.5 mL Intramuscular Tomorrow-1000 Hunsucker, Bonna Gains, MD       prochlorperazine (COMPAZINE) injection 10 mg  10 mg Intravenous Q6H PRN Adefeso, Oladapo, DO   10 mg at 02/27/22 3267     Discharge Medications: Please see discharge summary for a list of discharge medications.  Relevant Imaging  Results:  Relevant Lab Results:   Additional Information [id number]  Purcell Mouton, RN

## 2022-02-28 NOTE — Progress Notes (Signed)
[facility]                                                                              Izzy Doubek, is a 37 y.o. 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 Amalia Hailey at [facility].  She presented to ED with nausea vomiting diarrhea and home flu test which was positive.  Patient was recently admitted to [facility] 12/10-12/16 for non-anion gap metabolic acidosis, ulcerative lesions on the inner thighs suspected due to fungal intertrigo.  Seen by dermatology and did not feel the patient had pyoderma gangrenosum.  She was treated with fluconazole and discharged with 7 more days of fluconazole.  She was recommended wound care with Aquacel Ag.  Previously was admitted at [facility] on 12/11/2021-12/30/2021 with cellulitis and candidal intertrigo and pneumonia, was treated with IV antibiotics and discharged on a 10-day course of doxycycline.  In ED, was noted to be hypothermic with temp 94 F, tachycardiac, tachypneic.  Sodium 133 creatinine 2.25.  Lactic acid 3.2. Influenza B+.  COVID RSV and influenza A negative. ABG showed pH of 7.05, pCO2 30, pO2 81, bicarb 8.  WBC count 18,000.  Patient was intubated admitted to ICU.  Chest x-ray showed bilateral interstitial infiltrates  1/1 admitted with severe ARDS, influenza pneumonia. Proned with marked improvement in P: F ratio 1/2 supinated, O2 better, ventilation better, renal function stable but with AKI 1/5 remains on the ventilator, sedated.  Repeating potassium.  Off pressors 1/7 Fever/leukocytosis, started on empric abx with unclear source.  1/9 following more commands,  intermittent agitation, extubated, failed SLP, intermittent BiPAP 1/11: Transfer to the floor, TRH assumed care.  Failed SLP, NPO, plan for MBS -> passed, started on diet   Assessment & Plan    Principal Problem:   Multifocal pneumonia acute respiratory failure with hypoxia, ARDS Influenza B positive pneumonia with superimposed bacterial pneumonia -Intubated on admission, extubated on 1/9 -Completed Tamiflu on 1/17 -Currently on IV meropenem, day #6, stop tomorrow, repeat CBC -Continue flutter valve, incentive spirometry, mobilize with PT -Improving, O2 sats 97% on room air  Active problems Sepsis - tracheal aspirate with rare Candida albicans -UA showed large leukocytes, > 50 WBCs, rare bacteria, urine culture showed no growth, UTI ruled out -Continue IV meropenem, day #6, stop date 1/16   Acute kidney injury, non-anion gap metabolic acidosis Hypernatremia -Likely due to diarrhea, poor p.o. intake, received IV fluid hydration -Sodium improved to 137, creatinine at baseline, 0.69 today   Dysphagia -Passed MBS, started on dysphagia 3, honey thickened liquids -Will repeat SLP evaluation, if she can be on regular diet  Hypokalemia -Resolved, K3.5   Chronic Panniculitis  Open wound on abdomen  Chronic fungal intertrigo  -CT abdomen showed no soft tissue infection  or abscess -Continue meropenem, day #6 today. -Per patient, wound has been improving, this has been a chronic issue going on for at least a year -Continue wound care and dressing changes  Hyperammonemia -RUQ [place] with hepatic steatosis, -Ammonia level improved to 19.  Ammonia level 42 on 1/6 -Lactulose on hold due to diarrhea  Hypomagnesemia, hypophosphatemia -Magnesium 2.1  Protein calorie malnutrition -Tolerating diet   Acute metabolic encephalopathy, delirium -Possibly due to polypharmacy, multiple psych medications and PNA -Patient's sister had reported to PCCM about finding multiple empty bottles of  ibuprofen at home.  Discussed in detail with the patient 1/13, she reported that she may have had empty old bottles of ibuprofen at home however she did not take them, no suicidal ideation. - She is on prozac, gabapentin, olanzapine, suboxone, trazodone chronically, continue to hold off.  -Currently alert and oriented, delirium resolved  Hypertension -Nebs changed to Xopenex due to tachycardia -Continue Lopressor    Obesity, Class III, BMI 40-49.9 (morbid obesity) (HCC) Estimated body mass index is 49.16 kg/m as calculated from the following:   Height as of this encounter: 5\' 9"  (1.753 m).   Weight as of this encounter: 151 kg.  Code Status: Full code DVT Prophylaxis:  SCDs Start: 02/14/22 0002   Level of Care: Level of care: Progressive Family Communication:   Updated patient's sister, 04/15/22 on phone on 1/14  Disposition Plan:      Remains inpatient appropriate: Pending SNF bed availability, medically stable, repeat PT evaluation, SLP evaluation  Procedures:  Intubation, extubation MBS  Consultants:   Admitted by CCM  Antimicrobials:   Anti-infectives (From admission, onward)    Start     Dose/Rate Route Frequency Ordered Stop   02/23/22 1030  meropenem (MERREM) 1 g in sodium chloride 0.9 % 100 mL IVPB        1 g 200 mL/hr over 30 Minutes Intravenous Every 8 hours 02/23/22 1005 02/28/22 0250   02/20/22 1000  piperacillin-tazobactam (ZOSYN) IVPB 3.375 g  Status:  Discontinued        3.375 g 12.5 mL/hr over 240 Minutes Intravenous Every 8 hours 02/20/22 0910 02/23/22 0938   02/19/22 2200  oseltamivir (TAMIFLU) 6 MG/ML suspension 75 mg        75 mg Per Tube 2 times daily 02/19/22 0951 02/20/22 0928   02/19/22 1000  oseltamivir (TAMIFLU) 6 MG/ML suspension 75 mg  Status:  Discontinued        75 mg Per Tube 2 times daily 02/19/22 0835 02/19/22 0951   02/19/22 1000  ceFEPIme (MAXIPIME) 2 g in sodium chloride 0.9 % 100 mL IVPB  Status:  Discontinued        2 g 200 mL/hr  over 30 Minutes Intravenous Every 8 hours 02/19/22 0856 02/20/22 0856   02/15/22 2200  oseltamivir (TAMIFLU) 6 MG/ML suspension 30 mg  Status:  Discontinued       See Hyperspace for full Linked Orders Report.   30 mg Per Tube 2 times daily 02/15/22 1008 02/15/22 1714   02/15/22 2200  oseltamivir (TAMIFLU) 6 MG/ML suspension 30 mg  Status:  Discontinued        30 mg Per Tube 2 times daily 02/15/22 1714 02/19/22 0835   02/15/22 1200  oseltamivir (TAMIFLU) 6 MG/ML suspension 75 mg       See Hyperspace for full Linked Orders Report.   75 mg Per Tube  Once 02/15/22 1008 02/15/22 1224   02/15/22 1200  linezolid (ZYVOX) IVPB 600 mg  600 mg 300 mL/hr over 60 Minutes Intravenous Every 12 hours 02/15/22 1016 02/21/22 2311   02/14/22 1000  oseltamivir (TAMIFLU) capsule 30 mg  Status:  Discontinued        30 mg Oral 2 times daily 02/13/22 2159 02/15/22 1008   02/14/22 0800  ceFEPIme (MAXIPIME) 2 g in sodium chloride 0.9 % 100 mL IVPB  Status:  Discontinued        2 g 200 mL/hr over 30 Minutes Intravenous Every 12 hours 02/13/22 1946 02/19/22 0856   02/13/22 2000  metroNIDAZOLE (FLAGYL) IVPB 500 mg  Status:  Discontinued        500 mg 100 mL/hr over 60 Minutes Intravenous Every 12 hours 02/13/22 1908 02/15/22 1019   02/13/22 1953  vancomycin variable dose per unstable renal function (pharmacist dosing)  Status:  Discontinued         Does not apply See admin instructions 02/13/22 1953 02/15/22 1016   02/13/22 1945  oseltamivir (TAMIFLU) capsule 75 mg  Status:  Discontinued        75 mg Oral  Once 02/13/22 1943 02/15/22 1008   02/13/22 1915  ceFEPIme (MAXIPIME) 2 g in sodium chloride 0.9 % 100 mL IVPB        2 g 200 mL/hr over 30 Minutes Intravenous  Once 02/13/22 1908 02/13/22 2008   02/13/22 1845  vancomycin (VANCOCIN) 2,500 mg in sodium chloride 0.9 % 500 mL IVPB        2,500 mg 262.5 mL/hr over 120 Minutes Intravenous  Once 02/13/22 1835 02/13/22 2137   02/13/22 1845  piperacillin-tazobactam  (ZOSYN) IVPB 3.375 g  Status:  Discontinued        3.375 g 100 mL/hr over 30 Minutes Intravenous  Once 02/13/22 1836 02/13/22 1937   02/13/22 1630  amoxicillin (AMOXIL) capsule 1,000 mg        1,000 mg Oral STAT 02/13/22 1625 02/13/22 1640          Medications  Chlorhexidine Gluconate Cloth  6 each Topical Daily   docusate sodium  100 mg Oral BID   enoxaparin (LOVENOX) injection  80 mg Subcutaneous Q24H   feeding supplement  237 mL Oral TID BM   guaiFENesin-dextromethorphan  15 mL Oral Q6H   influenza vac split quadrivalent PF  0.5 mL Intramuscular Tomorrow-1000   levalbuterol  0.63 mg Nebulization TID   metoprolol tartrate  25 mg Oral BID   multivitamin with minerals  1 tablet Oral Daily   mouth rinse  15 mL Mouth Rinse 4 times per day   pantoprazole (PROTONIX) IV  40 mg Intravenous Q24H   pneumococcal 20-valent conjugate vaccine  0.5 mL Intramuscular Tomorrow-1000      Subjective:   [place] was seen and examined today.  Sitting upright on the bed, denies any specific complaints.  No acute nausea vomiting, abdominal pain.  Tolerating dysphagia 3 diet.  BP improving.  Objective:   Vitals:   02/27/22 2017 02/28/22 0443 02/28/22 0500 02/28/22 1022  BP: 137/80 117/70  (!) 137/91  Pulse: 86 90  87  Resp: (!) 22 (!) 22    Temp: 99 F (37.2 C) 97.8 F (36.6 C)    TempSrc: Oral Oral    SpO2: 94% 97%    Weight:   (!) 151 kg   Height:        Intake/Output Summary (Last 24 hours) at 02/28/2022 1233 Last data filed at 02/28/2022 0650 Gross per 24 hour  Intake 600 ml  Output 2201 ml  Net -1601 ml     Wt Readings from Last 3 Encounters:  02/28/22 (!) 151 kg  01/24/19 (!) 167.4 kg  11/02/18 (!) 164.7 kg    Physical Exam General: Alert and oriented x 3, NAD Cardiovascular: S1 S2 clear, RRR.  Respiratory: CTAB, no wheezing Gastrointestinal: Obese, soft, nontender, nondistended, NBS Ext: no pedal edema bilaterally Neuro: no new deficits Skin: Abdominal  wound as below Psych: Normal affect, pleasant      Data Reviewed:  I have personally reviewed following labs    CBC Lab Results  Component Value Date   WBC 9.3 02/26/2022   RBC 3.02 (L) 02/26/2022   HGB 9.2 (L) 02/26/2022   HCT 30.0 (L) 02/26/2022   MCV 99.3 02/26/2022   MCH 30.5 02/26/2022   PLT 451 (H) 02/26/2022   MCHC 30.7 02/26/2022   RDW 18.0 (H) 02/26/2022   LYMPHSABS 2.7 02/22/2022   MONOABS 2.0 (H) 02/22/2022   EOSABS 0.0 02/22/2022   BASOSABS 0.0 0000000     Last metabolic panel Lab Results  Component Value Date   NA 137 02/28/2022   K 3.5 02/28/2022   CL 113 (H) 02/28/2022   CO2 18 (L) 02/28/2022   BUN 13 02/28/2022   CREATININE 0.69 02/28/2022   GLUCOSE 94 02/28/2022   GFRNONAA >60 02/28/2022   GFRAA 93 11/02/2018   CALCIUM 8.3 (L) 02/28/2022   PHOS 3.7 02/23/2022   PROT 5.5 (L) 02/21/2022   ALBUMIN 1.8 (L) 02/21/2022   LABGLOB 2.7 11/02/2018   AGRATIO 1.6 11/02/2018   BILITOT 0.5 02/21/2022   ALKPHOS 120 02/21/2022   AST 20 02/21/2022   ALT 15 02/21/2022   ANIONGAP 6 02/28/2022    CBG (last 3)  No results for input(s): "GLUCAP" in the last 72 hours.     Coagulation Profile: No results for input(s): "INR", "PROTIME" in the last 168 hours.   Radiology Studies: I have personally reviewed the imaging studies  No results found.     Estill Cotta M.D. [facility] 02/28/2022, 12:33 PM  Available via Epic secure chat 7am-7pm After 7 pm, please refer to night coverage provider listed on amion.

## 2022-02-28 NOTE — Progress Notes (Signed)
Inpatient Rehab Admissions Coordinator:  ? ?Per therapy recommendations,  patient was screened for CIR candidacy by Lejon Afzal, MS, CCC-SLP. At this time, Pt. Appears to be a a potential candidate for CIR. I will place   order for rehab consult per protocol for full assessment. Please contact me any with questions. ? ?Ceferino Lang, MS, CCC-SLP ?Rehab Admissions Coordinator  ?[phone] (celll) ?[phone] (office) ? ?

## 2022-03-01 ENCOUNTER — Inpatient Hospital Stay (HOSPITAL_COMMUNITY): Payer: Medicaid Other

## 2022-03-01 DIAGNOSIS — N179 Acute kidney failure, unspecified: Secondary | ICD-10-CM | POA: Diagnosis not present

## 2022-03-01 DIAGNOSIS — R1084 Generalized abdominal pain: Secondary | ICD-10-CM | POA: Diagnosis not present

## 2022-03-01 DIAGNOSIS — R112 Nausea with vomiting, unspecified: Secondary | ICD-10-CM | POA: Diagnosis not present

## 2022-03-01 DIAGNOSIS — J189 Pneumonia, unspecified organism: Secondary | ICD-10-CM | POA: Diagnosis not present

## 2022-03-01 NOTE — Progress Notes (Signed)
Physical Therapy Treatment Patient Details Name: Alice [name] MRN: [id number] DOB: [birth year] Today's Date: 03/01/2022   History of Present Illness Patient is a 37 y.o. female presented to the emergency department with nausea vomiting diarrhea and a home flu test that was positive. Pt with multiple recent hospitalizations at outside hospitals. Pt at admitted to [facility] 12/11/21-12/30/21 with cellulitis and Candida intertrigo and pneumonia with sepsis treated with IV antibiotics discharged on a 10-day door course of doxycycline.Then pt admitted to [facility] from 40/98-11/91 for metabolic acidosis and ulcerative lesions on the inner thighs suspected to fungal intertrigo. Pt's sister reports pt struggling to mobilize once home and had a fall at home and brought to [facility], the patient was intubated and placed on mechanical life support prior to transfer. 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    PT Comments    General Comments: AxO x 3 cooperative, stronger voice.  Progressing with her mobility. Sig other in room and very helpful.  Assisted OOb to amb in hallway went well.  Pt self able to transfer to EOB with increased time and use of rails.  General transfer comment: pt able to rise from [place] using forward momentum.  General Gait Details: pt was able to amb 40 feet x 2 one seated rest break needed for fatigue and c/o B LE weakness. "My legs give way". Positioned in recliner to comfort.   Pt would benefit from aggressive Rehab such as AIR before returning home.   Recommendations for follow up therapy are one component of a multi-disciplinary discharge planning process, led by the attending physician.  Recommendations may be updated based on patient status, additional functional criteria and insurance  authorization.  Follow Up Recommendations  Acute inpatient rehab (3hours/day) Can patient physically be transported by private vehicle: Yes   Assistance Recommended at Discharge Frequent or constant Supervision/Assistance  Patient can return home with the following Two people to help with walking and/or transfers;Two people to help with bathing/dressing/bathroom;Assistance with cooking/housework;Assistance with feeding;Direct supervision/assist for medications management;Assist for transportation;Help with stairs or ramp for entrance   Equipment Recommendations       Recommendations for Other Services Rehab consult     Precautions / Restrictions Precautions Precautions: Fall Precaution Comments: wounds at panus and EDEMA throughout Restrictions Weight Bearing Restrictions: No     Mobility  Bed Mobility Overal bed mobility: Needs Assistance Bed Mobility: Supine to Sit     Supine to sit: Supervision, Min guard     General bed mobility comments: self able using increased time and rails.    Transfers Overall transfer level: Needs assistance Equipment used: Rolling walker (2 wheels) Transfers: Sit to/from Stand Sit to Stand: Min assist           General transfer comment: pt able to rise from recliner Min Asisst using forward momentum    Ambulation/Gait Ambulation/Gait assistance: Min assist Gait Distance (Feet): 80 Feet (40 feet x 2) Assistive device: Rolling walker (2 wheels) Gait Pattern/deviations: Step-to pattern, Decreased step length - left, Decreased step length - right Gait velocity: decreased     General Gait Details: pt was able to amb 40 feet x 2 one seated rest break needed for fatigue and c/o B LE weakness.   Stairs             Emergency planning/management officer  Modified Rankin (Stroke Patients Only)       Balance                                            Cognition Arousal/Alertness: Awake/alert Behavior During Therapy:  WFL for tasks assessed/performed Overall Cognitive Status: Within Functional Limits for tasks assessed                                 General Comments: AxO x 3 cooperative, stronger voice.  Progressing with her mobility.        Exercises      General Comments        Pertinent Vitals/Pain Pain Assessment Pain Assessment: No/denies pain    Home Living                          Prior Function            PT Goals (current goals can now be found in the care plan section) Progress towards PT goals: Progressing toward goals    Frequency    Min 3X/week      PT Plan Current plan remains appropriate    Co-evaluation              AM-PAC PT "6 Clicks" Mobility   Outcome Measure  Help needed turning from your back to your side while in a flat bed without using bedrails?: A Lot Help needed moving from lying on your back to sitting on the side of a flat bed without using bedrails?: A Lot Help needed moving to and from a bed to a chair (including a wheelchair)?: A Lot Help needed standing up from a chair using your arms (e.g., wheelchair or bedside chair)?: A Lot Help needed to walk in hospital room?: A Lot Help needed climbing 3-5 steps with a railing? : A Lot 6 Click Score: 12    End of Session Equipment Utilized During Treatment: Gait belt Activity Tolerance: Patient limited by fatigue Patient left: in chair;with call bell/phone within reach Nurse Communication: Mobility status PT Visit Diagnosis: Unsteadiness on feet (R26.81);Other abnormalities of gait and mobility (R26.89);Muscle weakness (generalized) (M62.81)     Time: 2595-6387 PT Time Calculation (min) (ACUTE ONLY): 24 min  Charges:  $Gait Training: 8-22 mins $Therapeutic Activity: 8-22 mins                     Rica Koyanagi  PTA [place] Office M-F          [phone] Weekend pager [phone]

## 2022-03-01 NOTE — Progress Notes (Signed)
  Inpatient Rehabilitation Admissions Coordinator   I spoke with patient by phone for rehab assessment. We discussed goals and expectations of a possible CIR admit. She prefers hospital rehab rather than SNF and did well with therapy today. She states her boyfriend and other relatives could assist when her sister works. Currently CIR beds very tight this week. I recommend other AIR level rehab beds to be pursued and I discussed with patient and she is in agreement. I will alert acute team and TOC. I will follow from a distance due to limited bed availability.Please call me with any questions.   Danne Baxter, RN, MSN Rehab Admissions Coordinator [phone]

## 2022-03-01 NOTE — Progress Notes (Signed)
Speech Language Pathology Treatment: Dysphagia  Patient Details Name: Alice [name] MRN: [id number] DOB: 10-30-1985 Today's Date: 03/01/2022 Time: 1035-1050 SLP Time Calculation (min) (ACUTE ONLY): 15 min  Assessment / Plan / Recommendation Clinical Impression  Skilled SLP to determine readiness for repeat MBS or clinical advancement indicated. Pt continues with dysphonia (she states consistent with when she has seasonal allergies, has not received her Singulair (sp?) and reports hospital is making her sneeze frequently) - Reports vocal strength to be nearly "normal" for allergy season and strength overall to be level 5/10. Significant other in the room reports that she nearly "gags" when even thinking about "hocking" - SLP reviewed clinical reasoning for this and encouraged her to continue efforts. PO trials including thin, nectar and honey observed - NO indication of aspiration with honey thick milk - immediate cough with thin concerning for aspiration and delayed cough with nectar. Advised against consuming thin water - but single ice chips are ok. Of note, pt silently aspirated her secretions on the MBS as well. Cough today is non productive.Pt admits to some coughing with water but denies any with solids. Given her ongoing dysphonia - which this SlP suspects is more related to her intubation event, concern for intubation related aspiration continues due to inadequate airway closure. Will plan MBS at 1500 if xray is able - RN informed and it was requested that pt be put in her recliner for testing. Advised against consuming thin water - but single ice chips are ok. Of note, pt silently aspirated her secretions on the MBS as well. SLP Inquired if pt had videos or voice mails of her dysphonic voice due to allergies -but none were reported present.     HPI HPI: 37 year old female, morbidly obese, BMI 54, history of fatty liver, gastroesophageal reflux history of kidney stones, chronic left flank  pain.  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 Amalia Hailey at [facility] admited with ARDS - required intubation 1/1-02/22/2022. She failed swallow screen - has no feeding tube at this time.  Pt has had several hospital admits over the last few months.  Pt reports she does not want a feeding tube placed through her nose.  Today has been on a dys3/HTL diet - and allowance of ice chips.  She is reportedly drinking thin water - she reports from melted ice.  Today follow up to assess tolerance of po and readiness for repeat MBS as Dr Tana Coast wishes.      SLP Plan   MBS!       Recommendations for follow up therapy are one component of a multi-disciplinary discharge planning process, led by the attending physician.  Recommendations may be updated based on patient status, additional functional criteria and insurance authorization.    Recommendations  Diet recommendations: Dysphagia 3 (mechanical soft);Honey-thick liquid;Other(comment) (ice) Liquids provided via: Cup;Straw Medication Administration: Other (Comment) Supervision: Intermittent supervision to cue for compensatory strategies Compensations: Slow rate;Small sips/bites;Clear throat intermittently Postural Changes and/or Swallow Maneuvers: Seated upright 90 degrees;Upright 30-60 min after meal                Oral Care Recommendations: Oral care prior to ice chip/H20 Follow Up Recommendations: Acute inpatient rehab (3hours/day) Assistance recommended at discharge: Frequent or constant Supervision/Assistance SLP Visit Diagnosis: Dysphagia, pharyngoesophageal phase (R13.14);Dysphagia, oral phase (R13.11);Dysphagia, pharyngeal phase (R13.13)           Macario Golds  03/01/2022, 11:00 AM

## 2022-03-01 NOTE — Progress Notes (Signed)
[facility]                                                                              Alice [name], is a 37 y.o. 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 [name] at [facility].  She presented to ED with nausea vomiting diarrhea and home flu test which was positive.  Patient was recently admitted to [facility] 12/10-12/16 for non-anion gap metabolic acidosis, ulcerative lesions on the inner thighs suspected due to fungal intertrigo.  Seen by dermatology and did not feel the patient had pyoderma gangrenosum.  She was treated with fluconazole and discharged with 7 more days of fluconazole.  She was recommended wound care with Aquacel Ag.  Previously was admitted at [facility] on 12/11/2021-12/30/2021 with cellulitis and candidal intertrigo and pneumonia, was treated with IV antibiotics and discharged on a 10-day course of doxycycline.  In ED, was noted to be hypothermic with temp 94 F, tachycardiac, tachypneic.  Sodium 133 creatinine 2.25.  Lactic acid 3.2. Influenza B+.  COVID RSV and influenza A negative. ABG showed pH of 7.05, pCO2 30, pO2 81, bicarb 8.  WBC count 18,000.  Patient was intubated admitted to ICU.  Chest x-ray showed bilateral interstitial infiltrates  1/1 admitted with severe ARDS, influenza pneumonia. Proned with marked improvement in P: F ratio 1/2 supinated, O2 better, ventilation better, renal function stable but with AKI 1/5 remains on the ventilator, sedated.  Repeating potassium.  Off pressors 1/7 Fever/leukocytosis, started on empric abx with unclear source.  1/9 following more commands,  intermittent agitation, extubated, failed SLP, intermittent BiPAP 1/11: Transfer to the floor, TRH assumed care.  Failed SLP, NPO, plan for MBS -> passed, started on diet.  Dysphagia 3, nectar thick diet 1/16: Repeat MBS today, pending CIR   Assessment & Plan    Principal Problem:   Multifocal pneumonia acute respiratory failure with hypoxia, ARDS Influenza B positive pneumonia with superimposed bacterial pneumonia -Intubated on admission, extubated on 1/9 -Completed Tamiflu on 1/17 -Completed IV meropenem today (7days) -Continue flutter valve, incentive spirometry, mobilize with PT -Stable, O2 sats 98% on room air  Active problems Sepsis - tracheal aspirate with rare Candida albicans -UA showed large leukocytes, > 50 WBCs, rare bacteria, urine culture showed no growth, UTI ruled out -Completed IV meropenem   Acute kidney injury, non-anion gap metabolic acidosis Hypernatremia -Likely due to diarrhea, poor p.o. intake, received IV fluid hydration -Na improved 137, creatinine 0.69   Dysphagia -Passed MBS, 1/11, started on dysphagia 3, honey thickened liquids -Repeating MBS today if diet can be advanced to regular  Hypokalemia -Resolved   Chronic Panniculitis  Open wound on abdomen  Chronic fungal intertrigo  -CT abdomen showed no soft tissue infection or abscess -Completed  meropenem, continue wound care and dressing changes -Per patient, wound has been improving, this has been a chronic issue going on for at least a year  Hyperammonemia -RUQ [place] with hepatic steatosis, -Ammonia level improved to 19.  Ammonia level 42 on 1/6 -Lactulose on hold due to diarrhea  Hypomagnesemia, hypophosphatemia -Magnesium 2.1  Protein calorie malnutrition -Tolerating diet   Acute metabolic encephalopathy, delirium -Possibly due to polypharmacy, multiple psych medications and PNA -Patient's sister had reported to PCCM about finding multiple empty bottles of ibuprofen at home.   Discussed in detail with the patient 1/13, she reported that she may have had empty old bottles of ibuprofen at home however she did not take them, no suicidal ideation. - She is on prozac, gabapentin, olanzapine, suboxone, trazodone chronically, continue to hold off.  -Delirium has resolved, currently alert and oriented.   Hypertension -Nebs changed to Xopenex due to tachycardia -Continue Lopressor    Obesity, Class III, BMI 40-49.9 (morbid obesity) (HCC) Estimated body mass index is 47.92 kg/m as calculated from the following:   Height as of this encounter: 5\' 9"  (1.753 m).   Weight as of this encounter: 147.2 kg.  Code Status: Full code DVT Prophylaxis:  SCDs Start: 02/14/22 0002   Level of Care: Level of care: Progressive Family Communication:   Updated patient's sister, Letha Cape on phone on 1/14, updated patient's significant other at the bedside today  Disposition Plan:      Remains inpatient appropriate: Pending CIR Procedures:  Intubation, extubation MBS  Consultants:   Admitted by CCM  Antimicrobials:   Anti-infectives (From admission, onward)    Start     Dose/Rate Route Frequency Ordered Stop   02/23/22 1030  meropenem (MERREM) 1 g in sodium chloride 0.9 % 100 mL IVPB        1 g 200 mL/hr over 30 Minutes Intravenous Every 8 hours 02/23/22 1005 02/28/22 0250   02/20/22 1000  piperacillin-tazobactam (ZOSYN) IVPB 3.375 g  Status:  Discontinued        3.375 g 12.5 mL/hr over 240 Minutes Intravenous Every 8 hours 02/20/22 0910 02/23/22 0938   02/19/22 2200  oseltamivir (TAMIFLU) 6 MG/ML suspension 75 mg        75 mg Per Tube 2 times daily 02/19/22 0951 02/20/22 0928   02/19/22 1000  oseltamivir (TAMIFLU) 6 MG/ML suspension 75 mg  Status:  Discontinued        75 mg Per Tube 2 times daily 02/19/22 0835 02/19/22 0951   02/19/22 1000  ceFEPIme (MAXIPIME) 2 g in sodium chloride 0.9 % 100 mL IVPB  Status:  Discontinued        2 g 200 mL/hr over 30 Minutes Intravenous  Every 8 hours 02/19/22 0856 02/20/22 0856   02/15/22 2200  oseltamivir (TAMIFLU) 6 MG/ML suspension 30 mg  Status:  Discontinued       See Hyperspace for full Linked Orders Report.   30 mg Per Tube 2 times daily 02/15/22 1008 02/15/22 1714   02/15/22 2200  oseltamivir (TAMIFLU) 6 MG/ML suspension 30 mg  Status:  Discontinued        30 mg Per Tube 2 times daily 02/15/22 1714 02/19/22 0835   02/15/22 1200  oseltamivir (TAMIFLU) 6 MG/ML suspension 75 mg       See Hyperspace for full Linked Orders Report.   75 mg Per Tube  Once 02/15/22 1008 02/15/22 1224   02/15/22 1200  linezolid (ZYVOX) IVPB 600 mg  600 mg 300 mL/hr over 60 Minutes Intravenous Every 12 hours 02/15/22 1016 02/21/22 2311   02/14/22 1000  oseltamivir (TAMIFLU) capsule 30 mg  Status:  Discontinued        30 mg Oral 2 times daily 02/13/22 2159 02/15/22 1008   02/14/22 0800  ceFEPIme (MAXIPIME) 2 g in sodium chloride 0.9 % 100 mL IVPB  Status:  Discontinued        2 g 200 mL/hr over 30 Minutes Intravenous Every 12 hours 02/13/22 1946 02/19/22 0856   02/13/22 2000  metroNIDAZOLE (FLAGYL) IVPB 500 mg  Status:  Discontinued        500 mg 100 mL/hr over 60 Minutes Intravenous Every 12 hours 02/13/22 1908 02/15/22 1019   02/13/22 1953  vancomycin variable dose per unstable renal function (pharmacist dosing)  Status:  Discontinued         Does not apply See admin instructions 02/13/22 1953 02/15/22 1016   02/13/22 1945  oseltamivir (TAMIFLU) capsule 75 mg  Status:  Discontinued        75 mg Oral  Once 02/13/22 1943 02/15/22 1008   02/13/22 1915  ceFEPIme (MAXIPIME) 2 g in sodium chloride 0.9 % 100 mL IVPB        2 g 200 mL/hr over 30 Minutes Intravenous  Once 02/13/22 1908 02/13/22 2008   02/13/22 1845  vancomycin (VANCOCIN) 2,500 mg in sodium chloride 0.9 % 500 mL IVPB        2,500 mg 262.5 mL/hr over 120 Minutes Intravenous  Once 02/13/22 1835 02/13/22 2137   02/13/22 1845  piperacillin-tazobactam (ZOSYN) IVPB 3.375 g   Status:  Discontinued        3.375 g 100 mL/hr over 30 Minutes Intravenous  Once 02/13/22 1836 02/13/22 1937   02/13/22 1630  amoxicillin (AMOXIL) capsule 1,000 mg        1,000 mg Oral STAT 02/13/22 1625 02/13/22 1640          Medications  Chlorhexidine Gluconate Cloth  6 each Topical Daily   docusate sodium  100 mg Oral BID   enoxaparin (LOVENOX) injection  80 mg Subcutaneous Q24H   feeding supplement  237 mL Oral TID BM   gabapentin  100 mg Oral BID   guaiFENesin-dextromethorphan  15 mL Oral Q6H   influenza vac split quadrivalent PF  0.5 mL Intramuscular Tomorrow-1000   metoprolol tartrate  25 mg Oral BID   multivitamin with minerals  1 tablet Oral Daily   mouth rinse  15 mL Mouth Rinse 4 times per day   pantoprazole (PROTONIX) IV  40 mg Intravenous Q24H   pneumococcal 20-valent conjugate vaccine  0.5 mL Intramuscular Tomorrow-1000      Subjective:   [place] was seen and examined today.  Sitting up in the bed, no acute complaints.  Had loose watery BM this morning.  No nausea vomiting, fevers or chills.  Tolerating dysphagia 3 diet.   Objective:   Vitals:   03/01/22 0804 03/01/22 0825 03/01/22 1003 03/01/22 1200  BP:  136/88 124/82   Pulse: 89     Resp: 16     Temp: 98 F (36.7 C)     TempSrc: Oral     SpO2: 98%     Weight:    (!) 147.2 kg  Height:        Intake/Output Summary (Last 24 hours) at 03/01/2022 1423 Last data filed at 03/01/2022 1343 Gross per 24 hour  Intake 600 ml  Output 1900 ml  Net -  1300 ml     Wt Readings from Last 3 Encounters:  03/01/22 (!) 147.2 kg  01/24/19 (!) 167.4 kg  11/02/18 (!) 164.7 kg   Physical Exam General: Alert and oriented x 3, NAD Cardiovascular: S1 S2 clear, RRR.  Respiratory: CTAB Gastrointestinal: Soft, nontender, nondistended, NBS Ext: no pedal edema bilaterally Neuro: no new deficits Skin: lower abdominal wound as below Psych: Normal affect, pleasant      Data Reviewed:  I have personally  reviewed following labs    CBC Lab Results  Component Value Date   WBC 9.2 02/28/2022   RBC 3.10 (L) 02/28/2022   HGB 9.5 (L) 02/28/2022   HCT 31.2 (L) 02/28/2022   MCV 100.6 (H) 02/28/2022   MCH 30.6 02/28/2022   PLT 564 (H) 02/28/2022   MCHC 30.4 02/28/2022   RDW 18.5 (H) 02/28/2022   LYMPHSABS 2.7 02/22/2022   MONOABS 2.0 (H) 02/22/2022   EOSABS 0.0 02/22/2022   BASOSABS 0.0 74/01/8785     Last metabolic panel Lab Results  Component Value Date   NA 137 02/28/2022   K 3.5 02/28/2022   CL 113 (H) 02/28/2022   CO2 18 (L) 02/28/2022   BUN 13 02/28/2022   CREATININE 0.69 02/28/2022   GLUCOSE 94 02/28/2022   GFRNONAA >60 02/28/2022   GFRAA 93 11/02/2018   CALCIUM 8.3 (L) 02/28/2022   PHOS 3.7 02/23/2022   PROT 5.5 (L) 02/21/2022   ALBUMIN 1.8 (L) 02/21/2022   LABGLOB 2.7 11/02/2018   AGRATIO 1.6 11/02/2018   BILITOT 0.5 02/21/2022   ALKPHOS 120 02/21/2022   AST 20 02/21/2022   ALT 15 02/21/2022   ANIONGAP 6 02/28/2022    CBG (last 3)  No results for input(s): "GLUCAP" in the last 72 hours.     Coagulation Profile: No results for input(s): "INR", "PROTIME" in the last 168 hours.   Radiology Studies: I have personally reviewed the imaging studies  No results found.     Estill Cotta M.D. [facility] 03/01/2022, 2:23 PM  Available via Epic secure chat 7am-7pm After 7 pm, please refer to night coverage provider listed on amion.

## 2022-03-01 NOTE — Progress Notes (Addendum)
Modified Barium Swallow Progress Note  Patient Details  Name: Alice [name] MRN: [id number] Date of Birth: [birth year]  Today's Date: 03/01/2022  Modified Barium Swallow completed.  Full report located under Chart Review in the Imaging Section.  Brief recommendations include the following:  Clinical Impression  Patient demonstrates improvement with her swallowing - functional oral swallow with piecemealing noted.    However, she continues with mild pharyngeal dysphagia with ongoing silent aspiration of thin and nectar liquids (mixed with secretions) during the swallow. Impaired laryngeal closure results in spillage of liquid barium into anterior and posterior trach during the swallow.  Chin tuck posture effective to prevent aspiration of nectar but not thin liquid even with tsp amount.   Patient needed encouragement- repositioning in recliner to be fully upright - pillow placed behind her for optimal position.  Patient is stronger overall fortunately however she continues to aspirate without clearance despite cued cough.    Recommend advance to dys3/nectar and allow tsps. Thin water between meals after mouth care for QOL and to continue to tax system to swallow thinner consistencies.    Pt may benefit from ENT consult due to her ongoing dysphonia - and silent aspiration.  Using video monitor live and teach back post-test - pt educated to recommendations. Pt was made aware to suspicion of advancing to nectar only prior to exam due to ongoing overt coughing with thin consistencies - concerning for overt aspiration. She is agreeable to plan.    Swallow Evaluation Recommendations       SLP Diet Recommendations: Dysphagia 3 (Mech soft) solids;Nectar thick liquid;Other (Comment) (ice chips ok, tsps of thin water ok between meals with chin tuck)   Liquid Administration via: Straw (thin water via tsp) TUCK CHIN WITH ALL LIQUID SWALLOWS   Medication Administration: Crushed with puree            Postural Changes: Seated upright at 90 degrees;Remain semi-upright after after feeds/meals (Comment)   Oral Care Recommendations: Oral care BID      Kathleen Lime, MS [facility] SLP Acute Rehab Services Office [phone] Pager [phone]   Macario Golds 03/01/2022,5:09 PM

## 2022-03-02 DIAGNOSIS — J189 Pneumonia, unspecified organism: Secondary | ICD-10-CM | POA: Diagnosis not present

## 2022-03-02 MED ORDER — ORAL CARE MOUTH RINSE: 15.0000 mL | OROMUCOSAL | Status: DC | PRN

## 2022-03-02 MED ORDER — ORAL CARE MOUTH RINSE
15.0000 mL | OROMUCOSAL | Status: DC
  Administered 2022-03-02 – 2022-03-03 (×2): 15 mL via OROMUCOSAL

## 2022-03-02 NOTE — Progress Notes (Signed)
Speech Language Pathology Treatment: Dysphagia  Patient Details Name: Alice [name] MRN: [id number] DOB: 10-24-1985 Today's Date: 03/02/2022 Time: 1720-1810 SLP Time Calculation (min) (ACUTE ONLY): 50 min  Assessment / Plan / Recommendation Clinical Impression  Skilled SLP indicated to assure tolerance of dietar advancement and reinforce effective compenstion strategies. Today pt reports they are still sending her honey thick liquids despite order being advanced to nectar- and while SLP stepped out of room, pt called to order meal and was informed that she is to have honey. SLP phoned dietary and advised that pt's diet was advanced to dys3/nectar on 1/16 at 1735 and thus she only needs nectar. The person spoke to kitchen staff and assured pt's liquids would be nectar thick. Today pt was taught to thicken liquids using her demonstration. She reports being comfortable with her dysphagia management plan for discharge.   Reviewed MBS flouro loops as pt is not consistenly recalling her chin tuck posture - showing her video loops of her aspirating nectar/thin mixed with secretions without cough response and without clearance with cued cough. Use of chin tuck effective to protect airway with nectar.   Using teach back, information reviewed in detail to assure confirmation of understanding - and written instructions provided. Pt then demonstrated chin tuck posture - and advised she felt that it "went the right way" (she does silently aspirate - but with overt aspiration - reflexive cough noted.   Voice perceptually is better today to this SLP - but pt reports it's the same. Thus had pt record herself speaking and advised she record herself daily as able *(or weekly) to assess for improvement.   If her dysphonia does not resolve, would recommend ENT follow up - however given today she is showing improvement, recommend she be given time for improvement. She also contributes this to allergies - although  voice was clear before hospital coarse. SLP recommends follow up with OP for dysphagia management including strengthening her cough and expectoration. Alice [name] reports she has been coughing and expectorating viscous brown tinged secretions today - which she summizes is her "pna" clearing from her lungs. Encouraged ongoiong use of her IS and flutter valve.   Reviewed s/s of aspiration pneumonia -if she plans to advance liquids without repeat instrumental evaluation. Given her age, improved phonation strength and with improved conditioning, she may tolerate diet advancement without having to undergo repeat MBS- defer to her and OP SLP.   Encouraged her to continue to consume thin water for ongoing acclimation of swallow system to thin. Thanks for allowing this SLP to assit with this pt's care plan - She has made excellent progress.   HPI HPI: 37 year old female, morbidly obese, BMI 54, history of fatty liver, gastroesophageal reflux history of kidney stones, chronic left flank pain.  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 Amalia Hailey at [place] admited with ARDS - required intubation 1/1-02/22/2022. She failed swallow screen - has no feeding tube at this time.  Pt has had several hospital admits over the last few months.  Pt reports she does not want a feeding tube placed through her nose.  Today has been on a dys3/HTL diet - and allowance of ice chips.  She is reportedly drinking thin wate from melted ice.  Today follow up to assess tolerance of po and readiness for repeat MBS as Dr Tana Coast wishes.  Repeat MBS conducted revealing ongoing recurrent aspiration of thin and nectar.  Chin tuck with nectar prevented aspiration.  SLP Plan  Continue with current plan of care      Recommendations for follow up therapy are one component of a multi-disciplinary discharge planning process, led by the attending physician.  Recommendations may be updated based on patient status,  additional functional criteria and insurance authorization.    Recommendations  Diet recommendations: Dysphagia 3 (mechanical soft);Nectar-thick liquid (ice and thin water between meals) Liquids provided via: Cup;Straw Medication Administration: Other (Comment) (with puree) Supervision: Intermittent supervision to cue for compensatory strategies Compensations: Slow rate;Small sips/bites;Clear throat intermittently;Other (Comment);Chin tuck Postural Changes and/or Swallow Maneuvers: Seated upright 90 degrees;Upright 30-60 min after meal                Oral Care Recommendations: Oral care prior to ice chip/H20 Follow Up Recommendations: Outpatient SLP Assistance recommended at discharge: Frequent or constant Supervision/Assistance SLP Visit Diagnosis: Dysphagia, pharyngeal phase (R13.13);Dysphagia, pharyngoesophageal phase (R13.14) Plan: Continue with current plan of care          Kathleen Lime, MS [place] Office [phone] Pager [phone]  Macario Golds  03/02/2022, 7:13 PM

## 2022-03-02 NOTE — Progress Notes (Signed)
Occupational Therapy Treatment Patient Details Name: Alice [name] MRN: [id number] DOB: [birth year] Today's Date: 03/02/2022   History of present illness Patient is a 37 y.o. female presented to the emergency department with nausea vomiting diarrhea and a home flu test that was positive. Pt with multiple recent hospitalizations at outside hospitals. Pt at admitted to [facility] 12/11/21-12/30/21 with cellulitis and Candida intertrigo and pneumonia with sepsis treated with IV antibiotics discharged on a 10-day door course of doxycycline.Then pt admitted to [facility] from 35/00-93/81 for metabolic acidosis and ulcerative lesions on the inner thighs suspected to fungal intertrigo. Pt's sister reports pt struggling to mobilize once home and had a fall at home and brought to [facility], the patient was intubated and placed on mechanical life support prior to transfer. 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   OT comments  Patient able to stand and ambulate short distance in room exhibiting the ability to perform toilet transfers. Patient reports hand weakness limiting her ability to perform ADLs. Therapist provided patient with squeeze ball and putty to work on hand strengthening. Also educated on shoulder ROM exercises to perform. Recommend AIR level rehab at discharge. If unable to find an AIR bed - recommend OP OT/PT services instead of home health -- as patient needs more aggressive rehab to improve strength and functional abilities.    Recommendations for follow up therapy are one component of a multi-disciplinary discharge planning process, led by the attending physician.  Recommendations may be updated based on patient status, additional functional criteria and insurance authorization.    Follow Up Recommendations  Acute inpatient rehab  (3hours/day)     Assistance Recommended at Discharge Intermittent Supervision/Assistance  Patient can return home with the following  A little help with walking and/or transfers;A lot of help with walking and/or transfers;Assistance with cooking/housework;Help with stairs or ramp for entrance;Assist for transportation   Equipment Recommendations  None recommended by OT    Recommendations for Other Services      Precautions / Restrictions Precautions Precautions: Fall Precaution Comments: wounds at panus and EDEMA throughout Restrictions Weight Bearing Restrictions: No          Balance Overall balance assessment: Needs assistance Sitting-balance support: No upper extremity supported, Feet supported Sitting balance-Leahy Scale: Fair     Standing balance support: Reliant on assistive device for balance Standing balance-Leahy Scale: Poor                             ADL either performed or assessed with clinical judgement   ADL Overall ADL's : Needs assistance/impaired                         Toilet Transfer: Ambulation;Rolling walker (2 wheels);Min guard Toilet Transfer Details (indicate cue type and reason): Demonstrated ability to ambulate to batroom. Unable to fit walker in front of BSC over toilet so turned around and returned to recliner. With recilner transfer demonstrated ability to get on and off toilet         Functional mobility during ADLs: Rolling walker (2 wheels) General ADL Comments: Patient reports needing assistance to don pants due to weak grip strength. Has walker, BSC and shower chair at home. Has lined up 24/7 family assist and getting ramp built.    Extremity/Trunk  Assessment Upper Extremity Assessment Upper Extremity Assessment: Generalized weakness RUE Deficits / Details: WFL ROM, grossly 3+/5 strength now RUE Sensation: WNL RUE Coordination: WNL LUE Deficits / Details: WFL ROM grossly 3+/5 strength, left grip strength  weaker than right LUE Sensation: WNL LUE Coordination: WNL   Lower Extremity Assessment Lower Extremity Assessment: Defer to PT evaluation   Cervical / Trunk Assessment Cervical / Trunk Assessment: Normal    Vision   Vision Assessment?: No apparent visual deficits   Perception     Praxis      Cognition Arousal/Alertness: Awake/alert Behavior During Therapy: WFL for tasks assessed/performed Overall Cognitive Status: Within Functional Limits for tasks assessed                                          Exercises Other Exercises Other Exercises: Provided with squeeze ball and pink putty for hand strengthening. Other Exercises: Instructed on AROM shoulder exercises.    Shoulder Instructions       General Comments      Pertinent Vitals/ Pain       Pain Assessment Pain Assessment: No/denies pain   Frequency  Min 2X/week        Progress Toward Goals  OT Goals(current goals can now be found in the care plan section)  Progress towards OT goals: Progressing toward goals  Acute Rehab OT Goals Patient Stated Goal: improve grip strength OT Goal Formulation: With patient Time For Goal Achievement: 03/10/22 Potential to Achieve Goals: Good  Plan Discharge plan remains appropriate    Co-evaluation                 AM-PAC OT "6 Clicks" Daily Activity     Outcome Measure   Help from another person eating meals?: A Little Help from another person taking care of personal grooming?: A Little Help from another person toileting, which includes using toliet, bedpan, or urinal?: A Little Help from another person bathing (including washing, rinsing, drying)?: A Little Help from another person to put on and taking off regular upper body clothing?: A Little Help from another person to put on and taking off regular lower body clothing?: A Lot 6 Click Score: 17    End of Session Equipment Utilized During Treatment: Rolling walker (2 wheels)  OT  Visit Diagnosis: Muscle weakness (generalized) (M62.81)   Activity Tolerance Patient tolerated treatment well   Patient Left in chair;with call bell/phone within reach   Nurse Communication Mobility status        Time: 1128-1150 OT Time Calculation (min): 22 min  Charges: OT General Charges $OT Visit: 1 Visit OT Treatments $Self Care/Home Management : 8-22 mins  Gustavo Lah, OTR/L [place]  Office [phone]   Lenward Chancellor 03/02/2022, 12:49 PM

## 2022-03-02 NOTE — TOC Progression Note (Signed)
Transition of Care [facility]) - Progression Note    Patient Details  Name: Alice [name] MRN: [id number] Date of Birth: 09-29-1985  Transition of Care [facility]) CM/SW Contact  Purcell Mouton, RN Phone Number: 03/02/2022, 3:14 PM  Clinical Narrative:     Pt faxed to AIR.    Barriers to Discharge: Continued Medical Work up  Expected Discharge Plan and Services In-house Referral: NA Discharge Planning Services: CM Consult Post Acute Care Choice: Durable Medical Equipment Living arrangements for the past 2 months: Apartment                                       Social Determinants of Health (SDOH) Interventions SDOH Screenings   Food Insecurity: No Food Insecurity (02/17/2022)  Housing: Low Risk  (02/17/2022)  Depression (PHQ2-9): Low Risk  (01/24/2019)  Recent Concern: Depression (PHQ2-9) - Medium Risk (12/09/2018)  Tobacco Use: High Risk (02/13/2022)    Readmission Risk Interventions     No data to display

## 2022-03-02 NOTE — Progress Notes (Addendum)
[facility]                                                                              Alleyne Lac, is a 37 y.o. 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 [name] at [facility].  She presented to ED with nausea vomiting diarrhea and home flu test which was positive.  Patient was recently admitted to [facility] 12/10-12/16 for non-anion gap metabolic acidosis, ulcerative lesions on the inner thighs suspected due to fungal intertrigo.  Seen by dermatology and did not feel the patient had pyoderma gangrenosum.  She was treated with fluconazole and discharged with 7 more days of fluconazole.  She was recommended wound care with Aquacel Ag.  Previously was admitted at [facility] on 12/11/2021-12/30/2021 with cellulitis and candidal intertrigo and pneumonia, was treated with IV antibiotics and discharged on a 10-day course of doxycycline.  In ED, was noted to be hypothermic with temp 94 F, tachycardiac, tachypneic.  Sodium 133 creatinine 2.25.  Lactic acid 3.2. Influenza B+.  COVID RSV and influenza A negative. ABG showed pH of 7.05, pCO2 30, pO2 81, bicarb 8.  WBC count 18,000.  Patient was intubated admitted to ICU.  Chest x-ray showed bilateral interstitial infiltrates  1/1 admitted with severe ARDS, influenza pneumonia. Proned with marked improvement in P: F ratio 1/2 supinated, O2 better, ventilation better, renal function stable but with AKI 1/5 remains on the ventilator, sedated.  Repeating potassium.  Off pressors 1/7 Fever/leukocytosis, started on empric abx with unclear source.  1/9 following more commands,  intermittent agitation, extubated, failed SLP, intermittent BiPAP 1/11: Transfer to the floor, TRH assumed care.  Failed SLP, NPO, plan for MBS -> passed, started on diet.  Dysphagia 3, nectar thick diet 1/16: Repeat MBS today, pending CIR   Assessment & Plan    Principal Problem:   Multifocal pneumonia acute respiratory failure with hypoxia, ARDS Influenza B positive pneumonia with superimposed bacterial pneumonia -Intubated on admission, extubated on 1/9 -Completed Tamiflu on 1/17 -Completed IV meropenem today (7 days) -Continue flutter valve, incentive spirometry, mobilize with PT -Stable, O2 sats 98% on room air  Active problems Sepsis - tracheal aspirate with rare Candida albicans -UA showed large leukocytes, > 50 WBCs, rare bacteria, urine culture showed no growth, UTI ruled out -Completed IV meropenem   Acute kidney injury, non-anion gap metabolic acidosis Hypernatremia -Likely due to diarrhea, poor p.o. intake, received IV fluid hydration -Na improved 137, creatinine 0.69   Dysphagia -Passed MBS, 1/11, started on dysphagia 3, honey thickened liquids -now on Dys 3  nectar diet  Hypokalemia -Resolved   Chronic Panniculitis  Open wound on abdomen  Chronic fungal intertrigo  -CT abdomen showed no soft tissue infection or abscess -Completed meropenem, continue  wound care and dressing changes -Per patient, wound has been improving, this has been a chronic issue since October  Hyperammonemia -RUQ [place] with hepatic steatosis, -Ammonia level improved to 19.  Ammonia level 42 on 1/6 -Lactulose on hold due to diarrhea  Hypomagnesemia, hypophosphatemia -Magnesium 2.1  Protein calorie malnutrition -Tolerating diet   Acute metabolic encephalopathy, delirium -Possibly due to polypharmacy, multiple psych medications and PNA -Patient's sister had reported to PCCM about finding multiple empty bottles of ibuprofen at home.  Discussed in detail with the patient 1/13, she  reported that she may have had empty old bottles of ibuprofen at home however she did not take them, no suicidal ideation. - She is on prozac, gabapentin, olanzapine, suboxone, trazodone chronically, continue to hold off.  -Delirium has resolved, currently alert and oriented.   Hypertension -Nebs changed to Xopenex due to tachycardia -Continue Lopressor    Obesity, Class III, BMI 40-49.9 (morbid obesity) (HCC) Estimated body mass index is 48.41 kg/m as calculated from the following:   Height as of this encounter: 5\' 9"  (1.753 m).   Weight as of this encounter: 148.7 kg.  Code Status: Full code DVT Prophylaxis:  SCDs Start: 02/14/22 0002   Level of Care: Level of care: Progressive Family Communication:   no updated today  Disposition Plan:      Remains inpatient appropriate: home am Procedures:  Intubation, extubation MBS  Consultants:   Admitted by CCM  Antimicrobials:    Medications  docusate sodium  100 mg Oral BID   enoxaparin (LOVENOX) injection  80 mg Subcutaneous Q24H   feeding supplement  237 mL Oral TID BM   gabapentin  100 mg Oral BID   influenza vac split quadrivalent PF  0.5 mL Intramuscular Tomorrow-1000   metoprolol tartrate  25 mg Oral BID   multivitamin with minerals  1 tablet Oral Daily   mouth rinse  15 mL Mouth Rinse 4 times per day   pantoprazole (PROTONIX) IV  40 mg Intravenous Q24H   pneumococcal 20-valent conjugate vaccine  0.5 mL Intramuscular Tomorrow-1000      Subjective:   Coherent awake in nad no focal defciti Walked some earlier.  Mobilizes fair  Discussed with PT and they feel she is close to basleine and it is more assistacne with dressings etc   Objective:   Vitals:   03/01/22 2101 03/02/22 0324 03/02/22 0816 03/02/22 1334  BP: 135/71  128/77 132/76  Pulse: 84  76 91  Resp:   18 18  Temp:   (!) 97.4 F (36.3 C) 97.8 F (36.6 C)  TempSrc:   Oral Oral  SpO2:   99% 100%  Weight:  (!) 148.4 kg (!) 148.7 kg   Height:         Intake/Output Summary (Last 24 hours) at 03/02/2022 1638 Last data filed at 03/02/2022 1630 Gross per 24 hour  Intake 240 ml  Output 2050 ml  Net -1810 ml      Wt Readings from Last 3 Encounters:  03/02/22 (!) 148.7 kg  01/24/19 (!) 167.4 kg  11/02/18 (!) 164.7 kg   Physical Exam  Well awake alert in nad Chest clear no added sound nop rales no rhonchi Abd soft--pannus slioghtly damp Wound clean based No LE edema  Data Reviewed:  I have personally reviewed following labs    CBC Lab Results  Component Value Date   WBC 9.2 02/28/2022   RBC 3.10 (L) 02/28/2022   HGB 9.5 (L) 02/28/2022   HCT 31.2 (L)  02/28/2022   MCV 100.6 (H) 02/28/2022   MCH 30.6 02/28/2022   PLT 564 (H) 02/28/2022   MCHC 30.4 02/28/2022   RDW 18.5 (H) 02/28/2022   LYMPHSABS 2.7 02/22/2022   MONOABS 2.0 (H) 02/22/2022   EOSABS 0.0 02/22/2022   BASOSABS 0.0 02/22/2022     Last metabolic panel Lab Results  Component Value Date   NA 137 02/28/2022   K 3.5 02/28/2022   CL 113 (H) 02/28/2022   CO2 18 (L) 02/28/2022   BUN 13 02/28/2022   CREATININE 0.69 02/28/2022   GLUCOSE 94 02/28/2022   GFRNONAA >60 02/28/2022   GFRAA 93 11/02/2018   CALCIUM 8.3 (L) 02/28/2022   PHOS 3.7 02/23/2022   PROT 5.5 (L) 02/21/2022   ALBUMIN 1.8 (L) 02/21/2022   LABGLOB 2.7 11/02/2018   AGRATIO 1.6 11/02/2018   BILITOT 0.5 02/21/2022   ALKPHOS 120 02/21/2022   AST 20 02/21/2022   ALT 15 02/21/2022   ANIONGAP 6 02/28/2022    CBG (last 3)  No results for input(s): "GLUCAP" in the last 72 hours.     Coagulation Profile: No results for input(s): "INR", "PROTIME" in the last 168 hours.   Radiology Studies: I have personally reviewed the imaging studies  DG Swallowing Func-Speech Pathology  Result Date: 03/01/2022 Table formatting from the original result was not included. 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 [name] at [facility]  Morbid obesity [facility])   Preterm labor   Round ligament pain 09/10/2012  Torn ACL  Past Surgical History: Past Surgical History: Procedure Laterality Date  addenoidectomy    CESAREAN SECTION N/A 02/22/2013  Procedure: CESAREAN SECTION/TWINS;  Surgeon: [name], MD;  Location: [facility] ORS;  Service: Obstetrics;  Laterality: N/A;  CHOLECYSTECTOMY    GALLBLADDER SURGERY  2019  NASAL SINUS SURGERY    NEPHRECTOMY    right side  OTHER SURGICAL HISTORY    ulner nerve removal  TONSILLECTOMY   HPI: 37 year old female, morbidly obese, BMI 54, history of fatty liver, gastroesophageal reflux history of kidney stones, chronic left flank pain.  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 [name] at [facility].  Pt admited with ARDS - required intubation 1/1-02/22/2022. She failed swallow screen - has no feeding tube at this time.  Pt has had several hospital admits over the last few months.  Pt reports she does not want a feeding tube placed through her nose.  Today has been on a dys3/HTL diet - and allowance of ice chips.  She is reportedly drinking thin water - she reports from melted ice.  Today follow up to assess tolerance of po and readiness for repeat MBS as Dr Isidoro Donning wishes.  Subjective: pt awake in chair  Recommendations for follow up therapy are one component of a multi-disciplinary discharge planning process, led by  the attending physician.  Recommendations may be updated based on patient status, additional functional criteria and insurance authorization. Assessment / Plan / Recommendation   03/01/2022   4:13 PM Clinical Impressions Clinical Impression Patient demonstrates improvement with her swallowing - functional oral swallow with piecemealing noted.  However, she continues with mild pharyngeal dysphagia with ongoing silent aspiration of thin and nectar liquids (mixed with secretions) during the swallow. Impaired laryngeal closure results in spillage of liquid barium into anterior and posterior trach during the swallow.  Chin tuck posture effective to prevent aspiration of nectar but not thin liquid even with tsp amount. Patient needed encouragement- repositioning in recliner to be fully upright - pillow placed behind her for optimal position.  Patient is stronger overall fortunately however she continues to aspirate without clearance despite cued cough.  Recommend advance to dys3/nectar and allow tsps. Thin water between meals after mouth care for QOL and to continue to tax system to swallow thinner consistencies.  Pt may benefit from ENT consult due to her ongoing dysphonia - and silent aspiration.  Using video monitor live and teach back post-test - pt educated to recommendations. Pt was made aware to suspicion of advancing to nectar only prior to exam due to ongoing overt coughing with thin consistencies - concerning for overt aspiration. She is agreeable to plan.  Tubed swallow precaution signs to floor for RN.   SLP Visit Diagnosis Dysphagia, pharyngeal phase (R13.13);Dysphagia, pharyngoesophageal phase (R13.14) Impact on safety and function Mild aspiration risk     03/01/2022   4:13 PM Treatment Recommendations Treatment Recommendations Therapy as outlined in treatment plan below     03/01/2022   5:08 PM Prognosis Prognosis for Safe Diet Advancement Good Barriers to Reach Goals Other (Comment)   03/01/2022   4:13 PM Diet  Recommendations SLP Diet Recommendations Dysphagia 3 (Mech soft) solids;Nectar thick liquid;Other (Comment) Liquid Administration via Straw Medication Administration Crushed with puree Postural Changes Seated upright at 90 degrees;Remain semi-upright after after feeds/meals (Comment)     03/01/2022   4:13 PM Other Recommendations Oral Care Recommendations Oral care BID Follow Up Recommendations Acute inpatient rehab (3hours/day) Functional Status Assessment Patient has had a recent decline in their functional status and/or demonstrates limited ability to make significant improvements in function in a reasonable and predictable amount of time   03/01/2022   4:13 PM Frequency and Duration  Speech Therapy Frequency (ACUTE ONLY) min 2x/week Treatment Duration 2 weeks     03/01/2022   3:54 PM Oral Phase Oral Phase WFL Oral - Honey Teaspoon NT Oral - Honey Cup [facility];Piecemeal swallowing Oral - Nectar Teaspoon WFL;Piecemeal swallowing Oral - Nectar Cup [facility];Piecemeal swallowing Oral - Thin Teaspoon WFL;Piecemeal swallowing Oral - Thin Cup [facility];Piecemeal swallowing Oral - Thin Straw WFL;Piecemeal swallowing Oral - Puree WFL;Piecemeal swallowing Oral - Mech Soft WFL;Piecemeal swallowing    03/01/2022   3:58 PM Pharyngeal Phase Pharyngeal Phase Impaired Pharyngeal- Honey Teaspoon NT Pharyngeal- Honey Cup [facility] Pharyngeal Material does not enter airway Pharyngeal- Nectar Teaspoon Penetration/Aspiration during swallow;Trace aspiration;Reduced airway/laryngeal closure;Inter-arytenoid space residue Pharyngeal Material enters airway, passes BELOW cords without attempt by patient to eject out (silent aspiration) Pharyngeal- Nectar Cup Penetration/Aspiration during swallow;Reduced airway/laryngeal closure Pharyngeal Material enters airway, passes BELOW cords without attempt by patient to eject out (silent aspiration) Pharyngeal- Nectar Straw Penetration/Aspiration during swallow;Reduced airway/laryngeal closure Pharyngeal Material enters  airway, passes BELOW cords without attempt by patient to eject out (silent aspiration) Pharyngeal- Thin Teaspoon Reduced airway/laryngeal closure;Penetration/Aspiration during swallow Pharyngeal Material enters airway, passes  BELOW cords without attempt by patient to eject out (silent aspiration) Pharyngeal- Thin Cup Reduced airway/laryngeal closure Pharyngeal Material enters airway, passes BELOW cords without attempt by patient to eject out (silent aspiration) Pharyngeal- Thin Straw Penetration/Aspiration during swallow;Moderate aspiration Pharyngeal Material enters airway, passes BELOW cords without attempt by patient to eject out (silent aspiration) Pharyngeal- Puree WFL Pharyngeal Material does not enter airway Pharyngeal- Mechanical Soft WFL Pharyngeal Material does not enter airway Pharyngeal Comment Chin tuck tested today with improved airway closure/protection with nectar thick liquids.  Pt continues with silent aspiration of thin and nectar consistenies- both anterior and posterior trachea.   Head turn right and left  did not improve airway protection. Pt continues to aspirate secretions as well.  Pt attributes her dysphonia to "allergies" however admits that voice was normal prior to admission.  SLP recommends to consider an ENT consult given pt's ongoing dypshonia and silent, weak nonproductive cough *at times productive and ongoing aspiration given pt is 36 years young.    03/01/2022   4:09 PM Cervical Esophageal Phase  Cervical Esophageal Phase [facility] Cervical Esophageal Comment Pt continues with some barium retention mixed with secretions above UES - continued suspected impaired traction of resulting in liquid retention but not solids Rolena Infante, MS [facility] SLP Acute Rehab Services Office [phone] Pager [phone] Chales Abrahams 03/01/2022, 5:10 PM                         Rhetta Mura M.D. [facility] 03/02/2022, 4:39 PM  Available via Epic secure chat 7am-7pm After 7 pm, please  refer to night coverage provider listed on amion.

## 2022-03-03 ENCOUNTER — Other Ambulatory Visit (HOSPITAL_COMMUNITY): Payer: Self-pay

## 2022-03-03 DIAGNOSIS — J189 Pneumonia, unspecified organism: Secondary | ICD-10-CM | POA: Diagnosis not present

## 2022-03-03 LAB — CBC WITH DIFFERENTIAL/PLATELET
Abs Immature Granulocytes: 0.04 10*3/uL (ref 0.00–0.07)
Basophils Absolute: 0.1 10*3/uL (ref 0.0–0.1)
Basophils Relative: 1 %
Eosinophils Absolute: 0.3 10*3/uL (ref 0.0–0.5)
Eosinophils Relative: 4 %
HCT: 28.8 % — ABNORMAL LOW (ref 36.0–46.0)
Hemoglobin: 9 g/dL — ABNORMAL LOW (ref 12.0–15.0)
Immature Granulocytes: 1 %
Lymphocytes Relative: 46 %
Lymphs Abs: 3.5 10*3/uL (ref 0.7–4.0)
MCH: 31.3 pg (ref 26.0–34.0)
MCHC: 31.3 g/dL (ref 30.0–36.0)
MCV: 100 fL (ref 80.0–100.0)
Monocytes Absolute: 0.7 10*3/uL (ref 0.1–1.0)
Monocytes Relative: 9 %
Neutro Abs: 3.1 10*3/uL (ref 1.7–7.7)
Neutrophils Relative %: 39 %
Platelets: 524 10*3/uL — ABNORMAL HIGH (ref 150–400)
RBC: 2.88 MIL/uL — ABNORMAL LOW (ref 3.87–5.11)
RDW: 18 % — ABNORMAL HIGH (ref 11.5–15.5)
WBC: 7.8 10*3/uL (ref 4.0–10.5)
nRBC: 0.3 % — ABNORMAL HIGH (ref 0.0–0.2)

## 2022-03-03 LAB — BASIC METABOLIC PANEL
Anion gap: 6 (ref 5–15)
BUN: 16 mg/dL (ref 6–20)
CO2: 21 mmol/L — ABNORMAL LOW (ref 22–32)
Calcium: 8.6 mg/dL — ABNORMAL LOW (ref 8.9–10.3)
Chloride: 111 mmol/L (ref 98–111)
Creatinine, Ser: 0.73 mg/dL (ref 0.44–1.00)
GFR, Estimated: >60 mL/min (ref >60)
Glucose, Bld: 80 mg/dL (ref 70–99)
Potassium: 3.5 mmol/L (ref 3.5–5.1)
Sodium: 138 mmol/L (ref 135–145)

## 2022-03-03 MED ORDER — METOPROLOL TARTRATE 25 MG PO TABS
25.0000 mg | ORAL_TABLET | Freq: Two times a day (BID) | ORAL | 3 refills | Status: DC
  Filled 2022-03-03: qty 60, 30d supply, fill #0

## 2022-03-03 MED ORDER — GABAPENTIN 100 MG PO CAPS
100.0000 mg | ORAL_CAPSULE | Freq: Two times a day (BID) | ORAL | 0 refills | Status: AC
  Filled 2022-03-03: qty 60, 30d supply, fill #0

## 2022-03-03 MED ORDER — NYSTATIN 100000 UNIT/GM EX POWD
1.0000 | Freq: Three times a day (TID) | CUTANEOUS | 0 refills | Status: DC
  Filled 2022-03-03: qty 15, 5d supply, fill #0

## 2022-03-03 MED ORDER — OXYCODONE HCL 5 MG PO TABS
5.0000 mg | ORAL_TABLET | Freq: Four times a day (QID) | ORAL | 0 refills | Status: AC | PRN
  Filled 2022-03-03: qty 30, 8d supply, fill #0

## 2022-03-03 MED ORDER — "AQUACEL AG ADVANTAGE 4""X5"" EX PADS"
30.0000 | MEDICATED_PAD | CUTANEOUS | 0 refills | Status: DC
  Filled 2022-03-03: qty 30, fill #0

## 2022-03-03 MED ORDER — "INTERDRY 10""X144"" EX SHEE"
30.0000 | MEDICATED_PATCH | CUTANEOUS | 0 refills | Status: DC
  Filled 2022-03-03: qty 30, fill #0

## 2022-03-03 NOTE — Progress Notes (Signed)
Physical Therapy Treatment Patient Details Name: Alice [name] MRN: [id number] DOB: 07-Apr-1985 Today's Date: 03/03/2022   History of Present Illness Patient is a 37 y.o. female presented to the emergency department with nausea vomiting diarrhea and a home flu test that was positive. Pt with multiple recent hospitalizations at outside hospitals. Pt at admitted to [facility] 12/11/21-12/30/21 with cellulitis and Candida intertrigo and pneumonia with sepsis treated with IV antibiotics discharged on a 10-day door course of doxycycline.Then pt admitted to [facility] from 17/61-60/73 for metabolic acidosis and ulcerative lesions on the inner thighs suspected to fungal intertrigo. Pt's sister reports pt struggling to mobilize once home and had a fall at home and brought to [facility], the patient was intubated and placed on mechanical life support prior to transfer. 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    PT Comments    Pt plans to D/C to home vs Inpt Rehab.  So practiced stairs.  General stair comments: 50% VC's on proper tech.  Pt was able to navigate 2 steps but no more due to weakness/fatigue.  Pt did get a RAMP built in prep for D/C to home. Pt has all equipment.   Recommendations for follow up therapy are one component of a multi-disciplinary discharge planning process, led by the attending physician.  Recommendations may be updated based on patient status, additional functional criteria and insurance authorization.  Follow Up Recommendations    Can patient physically be transported by private vehicle: Yes   Assistance Recommended at Discharge Frequent or constant Supervision/Assistance  Patient can return home with the following Two people to help with walking and/or transfers;Two people to help with  bathing/dressing/bathroom;Assistance with cooking/housework;Assistance with feeding;Direct supervision/assist for medications management;Assist for transportation;Help with stairs or ramp for entrance   Equipment Recommendations       Recommendations for Other Services       Precautions / Restrictions Precautions Precautions: Fall Precaution Comments: wounds at panus and EDEMA throughout Restrictions Weight Bearing Restrictions: No     Mobility  Bed Mobility               General bed mobility comments: sitting EOB on arrival    Transfers Overall transfer level: Needs assistance Equipment used: Rolling walker (2 wheels), None Transfers: Sit to/from Stand Sit to Stand: Supervision, Min guard           General transfer comment: pt able to rise from bed Supervision Min Asisst using forward momentum    Ambulation/Gait Ambulation/Gait assistance: Supervision, Min guard Gait Distance (Feet): 32 Feet Assistive device: Rolling walker (2 wheels) Gait Pattern/deviations: Step-to pattern, Decreased step length - left, Decreased step length - right Gait velocity: decreased     General Gait Details: pt was able to amb a functional distance with walker and even was able to take a few steps without   Stairs Stairs: Yes Stairs assistance: Min guard, Supervision Stair Management: Two rails, Step to pattern, Forwards Number of Stairs: 2 General stair comments: 50% VC's on proper tech.  Pt was able to navigate 2 steps but no more due to weakness/fatigue.  Pt did get a RAMP built in prep for D/C to home.   Wheelchair Mobility    Modified Rankin (Stroke Patients Only)       Balance  Cognition Arousal/Alertness: Awake/alert Behavior During Therapy: WFL for tasks assessed/performed Overall Cognitive Status: Within Functional Limits for tasks assessed                                 General  Comments: AxO x 3 cooperative, stronger voice.  Progressing with her mobility.        Exercises      General Comments        Pertinent Vitals/Pain Pain Assessment Pain Assessment: 0-10 Faces Pain Scale: Hurts a little bit Pain Location: back/general Pain Descriptors / Indicators: Aching Pain Intervention(s): Monitored during session    Home Living                          Prior Function            PT Goals (current goals can now be found in the care plan section) Progress towards PT goals: Progressing toward goals    Frequency    Min 3X/week      PT Plan Current plan remains appropriate    Co-evaluation              AM-PAC PT "6 Clicks" Mobility   Outcome Measure  Help needed turning from your back to your side while in a flat bed without using bedrails?: A Little Help needed moving from lying on your back to sitting on the side of a flat bed without using bedrails?: A Little Help needed moving to and from a bed to a chair (including a wheelchair)?: A Little Help needed standing up from a chair using your arms (e.g., wheelchair or bedside chair)?: A Little Help needed to walk in hospital room?: A Little Help needed climbing 3-5 steps with a railing? : A Little 6 Click Score: 18    End of Session Equipment Utilized During Treatment: Gait belt Activity Tolerance: Patient limited by fatigue Patient left: in bed;with call bell/phone within reach Nurse Communication: Mobility status PT Visit Diagnosis: Unsteadiness on feet (R26.81);Other abnormalities of gait and mobility (R26.89);Muscle weakness (generalized) (M62.81)     Time: 5790-3833 PT Time Calculation (min) (ACUTE ONLY): 33 min  Charges:  $Gait Training: 8-22 mins $Therapeutic Activity: 8-22 mins                     Rica Koyanagi  PTA [facility] M-F          [phone] Weekend pager [phone]

## 2022-03-03 NOTE — Progress Notes (Signed)
The Patient is to be discharged to home today. Discharge instructions reviewed with the Patient including all discharge Medications and schedules for these Medications. Wound Care reviewed with Patient regarding care to abd wound. Understanding verbalized and discharge AVS with the Patient at discharge

## 2022-03-03 NOTE — TOC Progression Note (Addendum)
Transition of Care [facility]) - Progression Note    Patient Details  Name: Alice [name] MRN: [id number] Date of Birth: 1986/02/03  [facility] CM/SW Contact  Leeroy Cha, RN Phone Number: 03/03/2022, 8:39 AM  Clinical Narrative:    Md has requested hhc for this patient.  Lives in [place], [place]. Difficult in place due to location and medicaid coverage.  Family is involved with care also.   [facility]hc texted to [facility]-unable to staff. [facility]hc orders faxed to [facility] with note to call back with decision. Internet search for hhc providers in the [place] area done.  These are the only two providers found.  [facility]-unable to staff and intern unable to staff per Kathrin at interim.  MD notified.. Barriers to Discharge: Continued Medical Work up  Expected Discharge Plan and Services In-house Referral: NA Discharge Planning Services: CM Consult Post Acute Care Choice: Durable Medical Equipment Living arrangements for the past 2 months: Apartment Expected Discharge Date: 03/03/22                                     Social Determinants of Health (SDOH) Interventions SDOH Screenings   Food Insecurity: No Food Insecurity (02/17/2022)  Housing: Low Risk  (02/17/2022)  Depression (PHQ2-9): Low Risk  (01/24/2019)  Recent Concern: Depression (PHQ2-9) - Medium Risk (12/09/2018)  Tobacco Use: High Risk (02/13/2022)    Readmission Risk Interventions   No data to display

## 2022-03-03 NOTE — Discharge Summary (Signed)
Physician Discharge Summary  LYSANDRA LOUGHMILLER JXB:[id number] DOB: [birth year] DOA: 02/13/2022  PCP: Pcp, No  Admit date: 02/13/2022 Discharge date: 03/03/2022  Time spent: 36 minutes  Recommendations for Outpatient Follow-up:  Needs dressing changes as per orders De-escalated majority of meds and will need outpatient follow-up of wound Needs labs in about a week Home health and services ordered  Discharge Diagnoses:  MAIN problem for hospitalization   Sepsis secondary to abdominal wound  Please see below for itemized issues addressed in HOpsital- refer to other progress notes for clarity if needed  Discharge Condition: Improved  Diet recommendation: Heart healthy  Filed Weights   03/02/22 0324 03/02/22 0816 03/03/22 0500  Weight: (!) 148.4 kg (!) 148.7 kg (!) 150.4 kg    History of present illness:  37 year old superobese female BMI 48 Fatty liver History of kidney stones 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 Proned with marked improvement in P: F ratio 1/2 supinated, O2 better, ventilation better, renal function stable but with AKI 1/5 remains on the ventilator, sedated.  Repeating potassium.  Off pressors 1/7 Fever/leukocytosis, started on empric abx with unclear source.  1/9 following more commands, intermittent agitation, extubated, failed SLP, intermittent BiPAP 1/11: Transfer to the floor, TRH assumed care.  Failed SLP, NPO, plan for MBS -> passed, started on diet.  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 - Treated with meropenem earlier in hospital stay, CT abdomen showed no abscess - Patient will continue Interdry as well as Aquacel dressings  Tachycardia, high blood pressure - Started on metoprolol 25 twice daily this admission and will continue  - Acute metabolic encephalopathy secondary to polypharmacy - Patient's sister reported finding multiple empty bottles at home - Discontinued Prozac olanzapine Suboxone trazodone - Lower dose of gabapentin given Very limited quantity of opiates given on discharge    Discharge Exam: Vitals:   03/02/22 2014 03/03/22 0534  BP: 110/60 115/64  Pulse: 91 83  Resp: 19 17  Temp: 97.7 F (36.5 C) 98 F (36.7 C)  SpO2: 98% 99%    Subj on day of d/c   Patient is well has no distress feels somewhat comfortable  General Exam on discharge  EOMI NCAT no focal deficit no icterus no pallor No fever no chills Looks comfortable Abdomen is soft I did not examine wounds today  Discharge Instructions   Discharge Instructions     Diet - low sodium heart healthy   Complete by: As directed    Discharge instructions   Complete by: As directed    Make sure that you get the dressings performed as  you have been doing here we will call in some powder to put under the area of your stomach and ensure that you keep the area dry and keep dry towels in between the falls to ensure that this heals  up We will call in a limited prescription of medication for the pain in your legs you will have to follow-up with your primary physician for refills Do not take any of your psychotropic or other medications for neuropathy or depression as this caused you to be quite sleepy and cause you to be ill Get labs in a week Best of luck   Discharge wound care:   Complete by: As directed    03/01/22 1625    Wound care  Daily      Comments: Cleanse abdominal and perineal skin folds with soap and water and pat dry. Apply Inter Dry to skin folds: Measure and cut length of InterDry to fit in skin folds that have skin breakdown Tuck InterDry fabric into skin folds in a single layer, allow for 2 inches of overhang from skin edges to allow for wicking to occur May remove to bathe; dry area thoroughly and then tuck into affected areas again Do not apply any creams or ointments when using InterDry DO NOT THROW AWAY FOR 5 DAYS unless soiled with stool DO NOT [facility] product, this will inactivate the silver in the material  New sheet of Interdry should be applied after 5 days of use if patient continues to have skin breakdown    For abdominal wound:   Apply aquacel Ag (LAWSON # [id number]) to open wound. Cut dressing into skinny strip and pack into dead space .  Cover with gauze and secure with ABD pad or mesh underwear to hold in place if this will fit body habitus comfortably.   Increase activity slowly   Complete by: As directed       Allergies as of 03/03/2022       Reactions   Cinnamon Anaphylaxis   Cinnamon flavoring   Other Swelling   Artificial cinnamon causes tongue to swell   Reglan [metoclopramide] Other (See Comments)   Tactile disturbances REACTION: causes skin to "crawl"   Cinnamon Flavor Swelling   Artificial cinnamon causes tongue to swell   Codeine Nausea Only   Straight codeine only Percocet & vicodin are tolerated by pt.   Flomax [tamsulosin] Other (See Comments)   Other reaction(s):  Other (See Comments) Oral sores Oral sores   Ibuprofen Other (See Comments)   Stomach pain Causes severe pain in stomach Pt can tolerate if used sparingly   Levofloxacin Hives   Tylenol [acetaminophen] Other (See Comments)   Pt reports Tylenol causes her kidney to bleed.        Medication List     STOP taking these medications    Diethylpropion HCl 25 MG Tabs   doxycycline 100 MG capsule Commonly known as: MONODOX   FLUoxetine 40 MG capsule Commonly known as: PROZAC   predniSONE 5 MG tablet Commonly known as: DELTASONE   Suboxone 8-2 MG Film Generic drug: Buprenorphine HCl-Naloxone HCl   traZODone 50 MG tablet Commonly known as: DESYREL       TAKE these medications    Aquacel Ag Advantage 4"X5" Pads Apply 30 each topically once a week.   cholecalciferol 25 MCG (1000 UNIT) tablet Commonly known as: VITAMIN D3 Take by mouth.   clotrimazole-betamethasone cream Commonly known as: LOTRISONE Apply topically 2 (two) times daily.   gabapentin 100 MG  capsule Commonly known as: NEURONTIN Take 1 capsule (100 mg total) by mouth 2 (two) times daily. What changed:  medication strength how much to take Another medication with the same name was removed. Continue taking this medication, and follow the directions you see here.   InterDry 16"[id number]"x144" Shee Apply 30 each topically once a week.   methocarbamol 500 MG tablet Commonly known as: ROBAXIN Take by mouth.   metoprolol tartrate 25 MG tablet Commonly known as: LOPRESSOR Take 1 tablet (25 mg total) by mouth 2 (two) times daily.   montelukast 10 MG tablet Commonly known as: SINGULAIR Take by mouth.   naloxone 4 MG/0.1ML Liqd nasal spray kit Commonly known as: NARCAN SMARTSIG:1 Spray(s) Both Nares PRN   nystatin powder Commonly known as: MYCOSTATIN/NYSTOP Apply 1 Application topically 2 (two) times daily. What changed: Another medication with the same name was added. Make sure you understand how and when  to take each.   nystatin powder Commonly known as: MYCOSTATIN/NYSTOP Apply 1 Application topically 3 (three) times daily. What changed: You were already taking a medication with the same name, and this prescription was added. Make sure you understand how and when to take each.   OLANZapine 10 MG tablet Commonly known as: ZYPREXA Take 1 tablet (10 mg total) by mouth at bedtime.   omeprazole 40 MG capsule Commonly known as: PRILOSEC Take 1 capsule (40 mg total) by mouth daily.   oxyCODONE 5 MG immediate release tablet Commonly known as: Oxy IR/ROXICODONE Take 1 tablet (5 mg total) by mouth every 6 (six) hours as needed for moderate pain or severe pain.   phentermine 37.5 MG tablet Commonly known as: ADIPEX-P Take 37.5 mg by mouth every morning.   pravastatin 80 MG tablet Commonly known as: PRAVACHOL Take by mouth.   Sodium Hypochlorite 0.0125 % Soln Apply topically.   SUMAtriptan 25 MG tablet Commonly known as: IMITREX Take 25 mg by mouth as needed.               Discharge Care Instructions  (From admission, onward)           Start     Ordered   03/03/22 0000  Discharge wound care:       Comments: 03/01/22 1625    Wound care  Daily      Comments: Cleanse abdominal and perineal skin folds with soap and water and pat dry. Apply Inter Dry to skin folds: Measure and cut length of InterDry to fit in skin folds that have skin breakdown Tuck InterDry fabric into skin folds in a single layer, allow for 2 inches of overhang from skin edges to allow for wicking to occur May remove to bathe; dry area thoroughly and then tuck into affected areas again Do not apply any creams or ointments when using InterDry DO NOT THROW AWAY FOR 5 DAYS unless soiled with stool DO NOT [facility] product, this will inactivate the silver in the material  New sheet of Interdry should be applied after 5 days of use if patient continues to have skin breakdown    For abdominal wound:   Apply  aquacel Ag (LAWSON # [id number]) to open wound. Cut dressing into skinny strip and pack into dead space .  Cover with gauze and secure with ABD pad or mesh underwear to hold in place if this will fit body habitus comfortably.   03/03/22 0831           Allergies  Allergen Reactions   Cinnamon Anaphylaxis  Cinnamon flavoring    Other Swelling    Artificial cinnamon causes tongue to swell   Reglan [Metoclopramide] Other (See Comments)    Tactile disturbances REACTION: causes skin to "crawl"   Cinnamon Flavor Swelling    Artificial cinnamon causes tongue to swell   Codeine Nausea Only    Straight codeine only Percocet & vicodin are tolerated by pt.   Flomax [Tamsulosin] Other (See Comments)    Other reaction(s): Other (See Comments) Oral sores Oral sores   Ibuprofen Other (See Comments)    Stomach pain Causes severe pain in stomach Pt can tolerate if used sparingly   Levofloxacin Hives   Tylenol [Acetaminophen] Other (See Comments)    Pt reports Tylenol causes her kidney to bleed.      The results of significant diagnostics from this hospitalization (including imaging, microbiology, ancillary and laboratory) are listed below for reference.    Significant Diagnostic Studies: DG Swallowing Func-Speech Pathology  Result Date: 03/01/2022 Table formatting from the original result was not included. 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 [name] at [facility]  Morbid obesity [facility])   Preterm labor   Round ligament pain 09/10/2012  Torn ACL  Past Surgical History: Past Surgical History: Procedure Laterality Date  addenoidectomy    CESAREAN SECTION N/A 02/22/2013  Procedure: CESAREAN SECTION/TWINS;  Surgeon: [name], MD;  Location: [facility];  Service: Obstetrics;  Laterality: N/A;  CHOLECYSTECTOMY    GALLBLADDER SURGERY  2019  NASAL SINUS SURGERY    NEPHRECTOMY    right side  OTHER SURGICAL HISTORY    ulner nerve removal  TONSILLECTOMY   HPI: 37 year old female, morbidly obese, BMI 54, history of fatty liver, gastroesophageal reflux history of kidney stones, chronic left flank pain.  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 [name] at [facility].  Pt admited with ARDS - required intubation 1/1-02/22/2022. She failed swallow screen - has no feeding tube at this time.  Pt has had several hospital admits over the last few months.  Pt reports she does not want a feeding tube placed through her nose.  Today has been on a dys3/HTL diet - and allowance of ice chips.  She is reportedly drinking thin water - she reports from melted ice.  Today follow up to assess tolerance of po and readiness for repeat MBS as Dr Isidoro Donning wishes.  Subjective: pt awake in chair  Recommendations for follow up therapy are one component of a multi-disciplinary discharge planning process, led by the attending physician.  Recommendations may be updated based on patient status, additional functional criteria and insurance authorization. Assessment / Plan / Recommendation   03/01/2022   4:13 PM Clinical Impressions Clinical Impression Patient demonstrates improvement with her swallowing - functional oral swallow with piecemealing noted.  However, she continues with mild pharyngeal dysphagia with ongoing  silent aspiration of thin and nectar liquids (mixed  with secretions) during the swallow. Impaired laryngeal closure results in spillage of liquid barium into anterior and posterior trach during the swallow.  Chin tuck posture effective to prevent aspiration of nectar but not thin liquid even with tsp amount. Patient needed encouragement- repositioning in recliner to be fully upright - pillow placed behind her for optimal position.  Patient is stronger overall fortunately however she continues to aspirate without clearance despite cued cough.  Recommend advance to dys3/nectar and allow tsps. Thin water between meals after mouth care for QOL and to continue to tax system to swallow thinner consistencies.  Pt may benefit from ENT consult due to her ongoing dysphonia - and silent aspiration.  Using video monitor live and teach back post-test - pt educated to recommendations. Pt was made aware to suspicion of advancing to nectar only prior to exam due to ongoing overt coughing with thin consistencies - concerning for overt aspiration. She is agreeable to plan.  Tubed swallow precaution signs to floor for RN.   SLP Visit Diagnosis Dysphagia, pharyngeal phase (R13.13);Dysphagia, pharyngoesophageal phase (R13.14) Impact on safety and function Mild aspiration risk     03/01/2022   4:13 PM Treatment Recommendations Treatment Recommendations Therapy as outlined in treatment plan below     03/01/2022   5:08 PM Prognosis Prognosis for Safe Diet Advancement Good Barriers to Reach Goals Other (Comment)   03/01/2022   4:13 PM Diet Recommendations SLP Diet Recommendations Dysphagia 3 (Mech soft) solids;Nectar thick liquid;Other (Comment) Liquid Administration via Straw Medication Administration Crushed with puree Postural Changes Seated upright at 90 degrees;Remain semi-upright after after feeds/meals (Comment)     03/01/2022   4:13 PM Other Recommendations Oral Care Recommendations Oral care BID Follow Up Recommendations Acute inpatient rehab (3hours/day) Functional Status Assessment  Patient has had a recent decline in their functional status and/or demonstrates limited ability to make significant improvements in function in a reasonable and predictable amount of time   03/01/2022   4:13 PM Frequency and Duration  Speech Therapy Frequency (ACUTE ONLY) min 2x/week Treatment Duration 2 weeks     03/01/2022   3:54 PM Oral Phase Oral Phase WFL Oral - Honey Teaspoon NT Oral - Honey Cup [facility];Piecemeal swallowing Oral - Nectar Teaspoon WFL;Piecemeal swallowing Oral - Nectar Cup [facility];Piecemeal swallowing Oral - Thin Teaspoon WFL;Piecemeal swallowing Oral - Thin Cup [facility];Piecemeal swallowing Oral - Thin Straw WFL;Piecemeal swallowing Oral - Puree WFL;Piecemeal swallowing Oral - Mech Soft WFL;Piecemeal swallowing    03/01/2022   3:58 PM Pharyngeal Phase Pharyngeal Phase Impaired Pharyngeal- Honey Teaspoon NT Pharyngeal- Honey Cup [facility] Pharyngeal Material does not enter airway Pharyngeal- Nectar Teaspoon Penetration/Aspiration during swallow;Trace aspiration;Reduced airway/laryngeal closure;Inter-arytenoid space residue Pharyngeal Material enters airway, passes BELOW cords without attempt by patient to eject out (silent aspiration) Pharyngeal- Nectar Cup Penetration/Aspiration during swallow;Reduced airway/laryngeal closure Pharyngeal Material enters airway, passes BELOW cords without attempt by patient to eject out (silent aspiration) Pharyngeal- Nectar Straw Penetration/Aspiration during swallow;Reduced airway/laryngeal closure Pharyngeal Material enters airway, passes BELOW cords without attempt by patient to eject out (silent aspiration) Pharyngeal- Thin Teaspoon Reduced airway/laryngeal closure;Penetration/Aspiration during swallow Pharyngeal Material enters airway, passes BELOW cords without attempt by patient to eject out (silent aspiration) Pharyngeal- Thin Cup Reduced airway/laryngeal closure Pharyngeal Material enters airway, passes BELOW cords without attempt by patient to eject out (silent aspiration)  Pharyngeal- Thin Straw Penetration/Aspiration during swallow;Moderate aspiration Pharyngeal Material enters airway, passes BELOW cords without attempt by patient to eject out (silent aspiration)  Pharyngeal- Puree WFL Pharyngeal Material does not enter airway Pharyngeal- Mechanical Soft WFL Pharyngeal Material does not enter airway Pharyngeal Comment Chin tuck tested today with improved airway closure/protection with nectar thick liquids.  Pt continues with silent aspiration of thin and nectar consistenies- both anterior and posterior trachea.   Head turn right and left  did not improve airway protection. Pt continues to aspirate secretions as well.  Pt attributes her dysphonia to "allergies" however admits that voice was normal prior to admission.  SLP recommends to consider an ENT consult given pt's ongoing dypshonia and silent, weak nonproductive cough *at times productive and ongoing aspiration given pt is 36 years young.    03/01/2022   4:09 PM Cervical Esophageal Phase  [facility]erv[facility]ophageal Phase [facility] Cervical Esophageal Comment Pt continues with some barium retention mixed with secretions above UES - continued suspected impaired traction of resulting in liquid retention but not solids Rolena Infanteammy K, MS [facility] SLP Acute Rehab Services Office [phone] Pager [phone] Chales AbrahamsKimball, Tamara Ann 03/01/2022, 5:10 PM                     DG Swallowing Func-Speech Pathology  Result Date: 02/24/2022 Table formatting from the original result was not included. Objective Swallowing Evaluation: Type of Study: MBS-Modified Barium Swallow Study  Patient Details Name: Shelia MediaBrittany L Fosse MRN: [id number] Date of Birth: Sep 05, 1985 Today's Date: 02/24/2022 Time: SLP Start Time (ACUTE ONLY): 1230 -SLP Stop Time (ACUTE ONLY): 1300 SLP Time Calculation (min) (ACUTE ONLY): 30 min Past Medical History: Past Medical History: Diagnosis Date  Chronic left flank pain   Chronic pain   Congenital obstruction of ureteropelvic junction (UPJ)   Congenital  obstructive defects of renal pelvis and ureter   Fatty liver   GERD (gastroesophageal reflux disease)   History of kidney stones   History of recurrent UTIs   Hypertension   Kidney disease   Loin pain hematuria syndrome   [facility]  Morbid obesity [facility])   Preterm labor   Round ligament pain 09/10/2012  Torn ACL  Past Surgical History: Past Surgical History: Procedure Laterality Date  addenoidectomy    CESAREAN SECTION N/A 02/22/2013  Procedure: CESAREAN SECTION/TWINS;  Surgeon: Lazaro ArmsLuther H Eure, MD;  Location: [facility];  Service: Obstetrics;  Laterality: N/A;  CHOLECYSTECTOMY    GALLBLADDER SURGERY  2019  NASAL SINUS SURGERY    NEPHRECTOMY    right side  OTHER SURGICAL HISTORY    ulner nerve removal  TONSILLECTOMY   HPI: 37 year old female, morbidly obese, BMI 54, history of fatty liver, gastroesophageal reflux history of kidney stones, chronic left flank pain.  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.  Pt admited with ARDS - required intubation 1/1-02/22/2022. She failed swallow screen - has no feeding tube at this time.  Pt has had several hospital admits over the last few months.  Pt reports she does not want a feeding tube placed through her nose.  Today pt seen to assess indication for MBS, readiness for po intake.  Subjective: pt awake in bariatric recliner in xray  Recommendations for follow up therapy are one component of a multi-disciplinary discharge planning process, led by the attending physician.  Recommendations may be updated based on patient status, additional functional criteria and insurance authorization. Assessment / Plan / Recommendation   02/24/2022   3:30 PM Clinical Impressions Clinical Impression Pt presents with mild oral and moderate pharyngeal dysphagia presumed due to edema from intubation  impacting laryngeal closure.  Impaired laryngeal closure allows aspiration of thin and nectar consistencies during and after swallow. Mild amount  of aspiration with thin and nectar liquid as well as secretions were not sensed and cued cough was NOT effective to clear.    Pt did reflexively cough *without clearance of airway* with large amounts of aspiration deep into trachea.  Chin tuck posture did not improve airway protection - worsened it.  Inconsistent delay in pharyngeal swallow reflex due to sensory deficits.  Honey thick, puree nor solids were aspirated -single episode of laryngeal penetration to cords with honey cleared with cued cough.  She is able to decrease retention with cued and reflexive dry swallows.  Pt is weak and must follow strict aspiration precautions - including sitting UPRIGHT for po intake.  She will need repeat MBS given silent nature of her dysphagia. SLP Visit Diagnosis Dysphagia, pharyngoesophageal phase (R13.14);Dysphagia, oral phase (R13.11);Dysphagia, pharyngeal phase (R13.13) Impact on safety and function Mild aspiration risk;Moderate aspiration risk     02/24/2022   3:30 PM Treatment Recommendations Treatment Recommendations Therapy as outlined in treatment plan below     02/24/2022   3:36 PM Prognosis Prognosis for Safe Diet Advancement Fair Barriers to Reach Goals Other (Comment)   02/24/2022   3:30 PM Diet Recommendations SLP Diet Recommendations Dysphagia 3 (Mech soft) solids;Honey thick liquids Liquid Administration via Cup Medication Administration Crushed with puree Compensations Slow rate;Small sips/bites;Clear throat intermittently Postural Changes Remain semi-upright after after feeds/meals (Comment);Seated upright at 90 degrees     02/24/2022   3:30 PM Other Recommendations Oral Care Recommendations Oral care BID Follow Up Recommendations Skilled nursing-short term rehab (<3 hours/day) Functional Status Assessment Patient has had a recent decline in their functional status and/or demonstrates limited ability to make significant improvements in function in a reasonable and predictable amount of time   02/24/2022   3:30  PM Frequency and Duration  Speech Therapy Frequency (ACUTE ONLY) min 2x/week Treatment Duration 2 weeks     02/24/2022   3:14 PM Oral Phase Oral Phase Impaired Oral - Honey Teaspoon WFL;Piecemeal swallowing Oral - Honey Cup [facility];Piecemeal swallowing Oral - Nectar Teaspoon WFL Oral - Nectar Cup Piecemeal swallowing Oral - Thin Teaspoon WFL Oral - Thin Cup WFL;Piecemeal swallowing Oral - Thin Straw WFL;Piecemeal swallowing Oral - Puree Delayed oral transit;Piecemeal swallowing Oral - Mech Soft Piecemeal swallowing;WFL    02/24/2022   3:15 PM Pharyngeal Phase Pharyngeal Phase Impaired Pharyngeal- Honey Teaspoon Penetration/Aspiration during swallow;Reduced airway/laryngeal closure Pharyngeal Material enters airway, CONTACTS cords and then ejected out Pharyngeal- Honey Cup WFL;Reduced airway/laryngeal closure Pharyngeal Material does not enter airway Pharyngeal- Nectar Teaspoon Penetration/Aspiration during swallow;Trace aspiration;Reduced airway/laryngeal closure;Inter-arytenoid space residue Pharyngeal Material enters airway, passes BELOW cords without attempt by patient to eject out (silent aspiration);Material enters airway, passes BELOW cords and not ejected out despite cough attempt by patient Pharyngeal- Nectar Cup Reduced airway/laryngeal closure;Penetration/Aspiration during swallow;Inter-arytenoid space residue;Delayed swallow initiation-pyriform sinuses Pharyngeal Material enters airway, passes BELOW cords without attempt by patient to eject out (silent aspiration) Pharyngeal- Thin Teaspoon Reduced airway/laryngeal closure;Inter-arytenoid space residue Pharyngeal Material enters airway, passes BELOW cords without attempt by patient to eject out (silent aspiration) Pharyngeal- Thin Cup Reduced airway/laryngeal closure;Penetration/Aspiration during swallow;Moderate aspiration;Inter-arytenoid space residue Pharyngeal Material enters airway, passes BELOW cords without attempt by patient to eject out (silent  aspiration) Pharyngeal- Thin Straw Inter-arytenoid space residue;Reduced airway/laryngeal closure;Moderate aspiration Pharyngeal Material enters airway, passes BELOW cords without attempt by patient to eject out (silent aspiration) Pharyngeal- Puree Delayed swallow  initiation-vallecula Pharyngeal Material does not enter airway Pharyngeal- Mechanical Soft Pharyngeal residue - valleculae Pharyngeal Material does not enter airway Pharyngeal Comment Chin Tuck did not improve airway protection worsened airway protection as it spilled boluses into posterior trachea, Pt currently has poor laryngeal closure allowing penetration and aspiration of liquids consistently in posterior trachea.  Edema is likely source and hopeful for improvement as pt continues to medically improve with time for intubation related edema to abate.    02/24/2022   3:27 PM Cervical Esophageal Phase  Cervical Esophageal Phase Impaired Cervical Esophageal Comment Mild retention of barium and secretions pooled at UES -  Solids/pudding transit through cervical esophagus better than liquids - presumed due to improved traction from bolus amount allowing improved muscular contraction. Chales Abrahams 02/24/2022, 3:49 PM  Rolena Infante, MS [facility] SLP Acute Rehab Services Office [phone] Pager [phone]                    DG Wrist 2 Views Left  Result Date: 02/21/2022 CLINICAL DATA:  Swelling EXAM: LEFT WRIST - 2 VIEW COMPARISON:  02/21/2022 FINDINGS: Diffuse wrist and hand subcutaneous edema. Normal alignment without acute osseous finding or fracture. Ulnar minus variance noted. No significant arthropathy. Distal radius, ulna and carpal bones intact. Peripheral IV noted in the dorsum of the hand. IMPRESSION: Diffuse soft tissue swelling. No acute osseous finding or malalignment. Electronically Signed   By: Judie Petit.  Shick M.D.   On: 02/21/2022 12:15   DG Hand 2 View Left  Result Date: 02/21/2022 CLINICAL DATA:  Swelling EXAM: LEFT HAND - 2 VIEW COMPARISON:   02/21/2022 FINDINGS: Diffuse wrist and hand soft tissue subcutaneous edema noted. Peripheral IV in the dorsum of the hand. No acute osseous finding, fracture, subluxation, or dislocation. Ulnar minus variance noted at the wrist. No significant arthropathy. IMPRESSION: Diffuse soft tissue swelling. No acute osseous finding by plain radiography. Electronically Signed   By: Judie Petit.  Shick M.D.   On: 02/21/2022 12:14   DG CHEST PORT 1 VIEW  Result Date: 02/20/2022 CLINICAL DATA:  Intubated EXAM: PORTABLE CHEST 1 VIEW COMPARISON:  02/19/2022 FINDINGS: Diffuse interstitial and alveolar opacity identified consistent with pulmonary edema. There is evidence of interval improvement in aeration when compared to yesterday's examination. No pneumothorax. No definite pleural effusion. NG tube tip extends below the diaphragm and off x-ray. Right IJ CVC tip overlies mid SVC. Endotracheal tube tip at the thoracic inlet. IMPRESSION: Pulmonary interstitial and alveolar opacities consistent with pulmonary edema with interval improvement. Electronically Signed   By: Layla Maw M.D.   On: 02/20/2022 17:14   DG Chest Port 1 View  Result Date: 02/19/2022 CLINICAL DATA:  Acute respiratory failure. EXAM: PORTABLE CHEST 1 VIEW COMPARISON:  February 15, 2022 FINDINGS: The ETT is in good position. The right central line is in good position. The distal NG tubes poorly evaluated due to decreased penetration. The cardiomediastinal silhouette is stable. No pneumothorax. Increased opacity in the medial right lung base thought to be due to vascular crowding. No other evidence of infiltrate. No other abnormalities are identified. IMPRESSION: 1. The study is limited due to the poorly penetrated low volume portable technique. Opacity in the medial right lung base is favored to represent vascular crowding or atelectasis. Developing infiltrate considered less likely. 2. Support apparatus as above. 3. No other abnormalities are identified.  Electronically Signed   By: Gerome Sam III M.D.   On: 02/19/2022 09:36   ECHOCARDIOGRAM COMPLETE  Result Date: 02/15/2022  ECHOCARDIOGRAM REPORT   Patient Name:   Shelia MediaBRITTANY L Mckey Date of Exam: 02/15/2022 Medical Rec #:  [id number]          Height:       69.0 in Accession #:    [id number]         Weight:       369.0 lb Date of Birth:  1985/11/11          BSA:          2.680 m Patient Age:    36 years           BP:           119/39 mmHg Patient Gender: F                  HR:           112 bpm. Exam Location:  Inpatient Procedure: 2D Echo, Cardiac Doppler, Color Doppler and Intracardiac            Opacification Agent Indications:    R06.02 SOB. Hypotension  History:        Patient has no prior history of Echocardiogram examinations.                 Abnormal ECG, Arrythmias:Tachycardia, Signs/Symptoms:Shortness                 of Breath and Dyspnea; Risk Factors:Current Smoker. FLU                 positive.  Sonographer:    Sheralyn Boatmanina West RDCS Referring Phys: [id number] Josephine IgoBRADLEY L ICARD  Sonographer Comments: Technically difficult study due to poor echo windows, patient is obese and echo performed with patient supine and on artificial respirator. Image acquisition challenging due to patient body habitus. Extremely difficult due to habitus. Patient supine on vent. IMPRESSIONS  1. Left ventricular ejection fraction, by estimation, is >75%. The left ventricle has hyperdynamic function. The left ventricle has no regional wall motion abnormalities. Indeterminate diastolic filling due to E-A fusion.  2. Right ventricular systolic function is mildly reduced. The right ventricular size is mildly enlarged. Tricuspid regurgitation signal is inadequate for assessing PA pressure.  3. The mitral valve is normal in structure. No evidence of mitral valve regurgitation. No evidence of mitral stenosis.  4. The aortic valve is normal in structure. Aortic valve regurgitation is not visualized. No aortic stenosis is present.  5. The  inferior vena cava is normal in size with greater than 50% respiratory variability, suggesting right atrial pressure of 3 mmHg. Conclusion(s)/Recommendation(s): Findings consistent with Cor Pulmonale. Small echodensity probably represents mitral annulus calcification, but consider TEE if endocarditis is suspected. FINDINGS  Left Ventricle: Left ventricular ejection fraction, by estimation, is >75%. The left ventricle has hyperdynamic function. The left ventricle has no regional wall motion abnormalities. The left ventricular internal cavity size was normal in size. There is no left ventricular hypertrophy. Indeterminate diastolic filling due to E-A fusion. Right Ventricle: The right ventricular size is mildly enlarged. No increase in right ventricular wall thickness. Right ventricular systolic function is mildly reduced. Tricuspid regurgitation signal is inadequate for assessing PA pressure. Left Atrium: Left atrial size was normal in size. Right Atrium: Right atrial size was normal in size. Pericardium: There is no evidence of pericardial effusion. Mitral Valve: A small echodensity is seen in the mitral annulus area. It appears fixed and relectivity suggests mitral annulus calcification (although that would be surprising at this age). The mitral valve is normal  in structure. No evidence of mitral valve regurgitation. No evidence of mitral valve stenosis. Tricuspid Valve: The tricuspid valve is normal in structure. Tricuspid valve regurgitation is not demonstrated. No evidence of tricuspid stenosis. Aortic Valve: The aortic valve is normal in structure. Aortic valve regurgitation is not visualized. No aortic stenosis is present. Pulmonic Valve: The pulmonic valve was normal in structure. Pulmonic valve regurgitation is not visualized. No evidence of pulmonic stenosis. Aorta: The aortic root is normal in size and structure. Venous: The inferior vena cava is normal in size with greater than 50% respiratory  variability, suggesting right atrial pressure of 3 mmHg. IAS/Shunts: No atrial level shunt detected by color flow Doppler.  LEFT VENTRICLE PLAX 2D LVIDd:         4.00 cm LVIDs:         2.40 cm LV PW:         1.00 cm LV IVS:        1.10 cm LVOT diam:     2.50 cm LV SV:         86 LV SV Index:   32 LVOT Area:     4.91 cm  LV Volumes (MOD) LV vol d, MOD A2C: 79.9 ml LV vol d, MOD A4C: 85.7 ml LV vol s, MOD A2C: 27.2 ml LV vol s, MOD A4C: 17.9 ml LV SV MOD A2C:     52.7 ml LV SV MOD A4C:     85.7 ml LV SV MOD BP:      63.8 ml RIGHT VENTRICLE             IVC RV S prime:     15.70 cm/s  IVC diam: 1.80 cm TAPSE (M-mode): 2.0 cm LEFT ATRIUM             Index        RIGHT ATRIUM          Index LA diam:        3.60 cm 1.34 cm/m   RA Area:     9.45 cm LA Vol (A2C):   36.2 ml 13.51 ml/m  RA Volume:   16.90 ml 6.30 ml/m LA Vol (A4C):   52.7 ml 19.66 ml/m LA Biplane Vol: 46.8 ml 17.46 ml/m  AORTIC VALVE LVOT Vmax:   128.00 cm/s LVOT Vmean:  73.233 cm/s LVOT VTI:    0.174 m  AORTA Ao Root diam: 3.20 cm Ao Asc diam:  2.80 cm MITRAL VALVE MV Area (PHT): 4.85 cm    SHUNTS MV Decel Time: 157 msec    Systemic VTI:  0.17 m MV E velocity: 78.80 cm/s  Systemic Diam: 2.50 cm MV A velocity: 89.00 cm/s MV E/A ratio:  0.89 Mihai Croitoru MD Electronically signed by Thurmon FairMihai Croitoru MD Signature Date/Time: 02/15/2022/3:51:23 PM    Final    VAS US UPPER EXTREMITY ARTERIAL DUPLEX  Result Date: 02/15/2022  UPPER EXTREMITY DUPLEX STUDY Patient Name:  Shelia MediaBRITTANY L Ochs  Date of Exam:   02/14/2022 Medical Rec #: [id number]           Accession #:    [id number] Date of Birth: 03-15-1985           Patient Gender: F Patient Age:   [age] years [facility] Procedure:      VAS US UPPER EXTREMITY ARTERIAL DUPLEX Referring Phys: Elige RadonBRADLEY ICARD --------------------------------------------------------------------------------  Indications: LUE hand cold/mottled. History:     Patient has a history of IV of left hand infiltrated w/  calcium.  Risk Factors: Hypertension, current smoker. Limitations: Patient position Comparison Study: No previous exams Performing Technologist: Jody Hill RVT, RDMS  Examination Guidelines: A complete evaluation includes B-mode imaging, spectral Doppler, color Doppler, and power Doppler as needed of all accessible portions of each vessel. Bilateral testing is considered an integral part of a complete examination. Limited examinations for reoccurring indications may be performed as noted.  Left Doppler Findings: +---------------+---------+---------+--------+---------------------------------+ Site           PSV      Waveform StenosisComments                                         (cm/s)                                                      +---------------+---------+---------+--------+---------------------------------+ Subclavian Prox                          not seen due to patient                                                    positioning                       +---------------+---------+---------+--------+---------------------------------+ Subclavian Mid 187      triphasic                                          +---------------+---------+---------+--------+---------------------------------+ Axillary       125      triphasic                                          +---------------+---------+---------+--------+---------------------------------+ Brachial Prox  113      triphasic                                          +---------------+---------+---------+--------+---------------------------------+ Brachial Dist  161      triphasic                                          +---------------+---------+---------+--------+---------------------------------+ Radial Prox    81       triphasic                                          +---------------+---------+---------+--------+---------------------------------+ Radial Mid     55       triphasic                                           +---------------+---------+---------+--------+---------------------------------+  Radial Dist    66       triphasic                                          +---------------+---------+---------+--------+---------------------------------+ Ulnar Prox     68       triphasic                                          +---------------+---------+---------+--------+---------------------------------+ Ulnar Mid      60       triphasic                                          +---------------+---------+---------+--------+---------------------------------+ Ulnar Dist     81       triphasic                                          +---------------+---------+---------+--------+---------------------------------+ Palmar Arch    20       biphasic                                           +---------------+---------+---------+--------+---------------------------------+  Summary:  Left: No obstruction visualized in the left upper extremity. *See table(s) above for measurements and observations. Electronically signed by Waverly Ferrari MD on 02/15/2022 at 3:49:50 PM.    Final    DG Chest Port 1 View  Result Date: 02/15/2022 CLINICAL DATA:  Acute hypoxemic respiratory failure. EXAM: PORTABLE CHEST 1 VIEW COMPARISON:  02/14/2022 FINDINGS: Right IJ catheter tip is in the projection of the SVC. ET tube tip is 2.6 cm above the carina. Enteric tube tip and side port are below the GE junction. Heart size and mediastinal contours are unremarkable. Low lung volumes. Bilateral pulmonary opacities are identified and appear unchanged from the previous exam. IMPRESSION: 1. Stable support apparatus. 2. No change in aeration to the lungs compared with previous exam. Electronically Signed   By: Signa Kell M.D.   On: 02/15/2022 15:32   DG Abd 1 View  Result Date: 02/14/2022 CLINICAL DATA:  OG tube placement EXAM: ABDOMEN - 1 VIEW COMPARISON:  02/14/2022 FINDINGS: Esophageal tube tip  overlies the proximal to mid stomach, side-port probably in the region of or just distal to GE junction. Upper gas pattern is unremarkable IMPRESSION: Esophageal tube tip overlies the proximal to mid stomach. Electronically Signed   By: Jasmine Pang M.D.   On: 02/14/2022 22:14   DG Chest Port 1 View  Result Date: 02/14/2022 CLINICAL DATA:  Tube placement EXAM: PORTABLE CHEST 1 VIEW COMPARISON:  02/14/2022, 4:16 p.m. FINDINGS: There is suggestion of diffuse alveolar opacity. No pneumothorax is identified. Endotracheal tube tip appears to be at the thoracic inlet. NG tube tip is barely below the diaphragm with the side port overlies the distal esophagus. NG tube should be advanced about 8 cm. IMPRESSION: 1. Very limited study due to underpenetration and body habitus. 2. Diffuse alveolar opacity. 3. NG tube should be advanced. Electronically Signed   By:  Layla Maw M.D.   On: 02/14/2022 20:31   US Abdomen Limited RUQ (LIVER/GB)  Result Date: 02/14/2022 CLINICAL DATA:  [id number] Cirrhosis (HCC) [id number] EXAM: ULTRASOUND ABDOMEN LIMITED RIGHT UPPER QUADRANT COMPARISON:  CT abdomen pelvis 02/20/2018 FINDINGS: Gallbladder: Status post cholecystectomy Common bile duct: Diameter: 3 mm Liver: No focal lesion identified. Increased parenchymal echogenicity. Portal vein is patent on color Doppler imaging with normal direction of blood flow towards the liver. Other: markedly limited evaluation due to body habitus and overlying bowel gas. IMPRESSION: 1. Hepatic steatosis. Please note limited evaluation for focal hepatic masses in a patient with hepatic steatosis due to decreased penetration of the acoustic ultrasound waves. 2. Markedly limited evaluation due to body habitus and overlying bowel gas. Electronically Signed   By: Tish Frederickson M.D.   On: 02/14/2022 17:06   DG CHEST PORT 1 VIEW  Result Date: 02/14/2022 CLINICAL DATA:  Placement of central venous catheter EXAM: PORTABLE CHEST 1 VIEW COMPARISON:  Previous  studies including the examination done earlier today FINDINGS: Transverse diameter of heart is in the upper limits of normal. Central pulmonary vessels are prominent. There is slight improvement in the aeration in parahilar regions. There are no new focal infiltrates. Tip of endotracheal tube is 4 cm above the carina. Enteric tube is noted traversing the esophagus. There is interval placement of right IJ central venous catheter with its tip in superior vena cava. IMPRESSION: Tip of right IJ central venous catheter is seen in superior vena cava. There is no pneumothorax. There is slight improvement in the aeration in parahilar regions, possibly suggesting decrease in pulmonary vascular congestion. Electronically Signed   By: Ernie Avena M.D.   On: 02/14/2022 16:35   DG Abd 1 View  Result Date: 02/14/2022 CLINICAL DATA:  NGT EXAM: ABDOMEN - 1 VIEW COMPARISON:  04/07/2017 FINDINGS: The bowel gas pattern is normal. No radio-opaque calculi or other significant radiographic abnormality are seen. NG tube tip is superimposed with the stomach, below the diaphragm. IMPRESSION: Negative.  NGT in place. Electronically Signed   By: Layla Maw M.D.   On: 02/14/2022 11:27   DG Chest Port 1V same Day  Result Date: 02/14/2022 CLINICAL DATA:  Encounter for intubation. EXAM: PORTABLE CHEST 1 VIEW COMPARISON:  Earlier today. FINDINGS: The ET tube has been adjusted. The tip is now 2.7 cm above the carina. Enteric tube tip is below the GE junction. Heart size is normal. Low lung volumes. Unchanged bilateral interstitial and airspace opacities compatible with multifocal pneumonia. IMPRESSION: 1. ET tube tip is now 2.7 cm above the carina. 2. No change in aeration to the lungs compared with previous exam. Electronically Signed   By: Signa Kell M.D.   On: 02/14/2022 09:33   DG Chest Portable 1 View  Result Date: 02/14/2022 CLINICAL DATA:  Status post intubation. EXAM: PORTABLE CHEST 1 VIEW COMPARISON:  02/13/2022  FINDINGS: ET tube tip is 6.7 cm above the carina. There is a enteric tube with tip below the GE junction. Stable cardiomediastinal contours. Progressive interstitial and airspace opacities identified bilaterally concerning for multifocal pneumonia. IMPRESSION: 1. ETT tip is 6.7 cm above the carina. The enteric tube tip is below the level of the GE junction. This 2. Progressive bilateral interstitial and airspace opacities. Electronically Signed   By: Signa Kell M.D.   On: 02/14/2022 08:41   CT Head Wo Contrast  Result Date: 02/13/2022 CLINICAL DATA:  Mental status change, unknown cause EXAM: CT HEAD WITHOUT CONTRAST TECHNIQUE: Contiguous axial images  were obtained from the base of the skull through the vertex without intravenous contrast. RADIATION DOSE REDUCTION: This exam was performed according to the departmental dose-optimization program which includes automated exposure control, adjustment of the mA and/or kV according to patient size and/or use of iterative reconstruction technique. COMPARISON:  CT head 03/30/2017 FINDINGS: Brain: No evidence of large-territorial acute infarction. No parenchymal hemorrhage. No mass lesion. No extra-axial collection. No mass effect or midline shift. No hydrocephalus. Basilar cisterns are patent. Vascular: No hyperdense vessel. Skull: No acute fracture or focal lesion. Sinuses/Orbits: Paranasal sinuses and mastoid air cells are clear. The orbits are unremarkable. Other: None. IMPRESSION: No acute intracranial abnormality. Electronically Signed   By: Tish Frederickson M.D.   On: 02/13/2022 19:01   CT CHEST ABDOMEN PELVIS WO CONTRAST  Result Date: 02/13/2022 CLINICAL DATA:  Abdominal pain, acute, nonlocalized. Mental status change, general abd pain, N/V. Hx of rt nephrectomy. Pt unable to follow breathing instructions or raise arms. c/o flu sx; pt states she is weak and was found in floor by ems when they arrived; EXAM: CT CHEST, ABDOMEN AND PELVIS WITHOUT CONTRAST  TECHNIQUE: Multidetector CT imaging of the chest, abdomen and pelvis was performed following the standard protocol without IV contrast. RADIATION DOSE REDUCTION: This exam was performed according to the departmental dose-optimization program which includes automated exposure control, adjustment of the mA and/or kV according to patient size and/or use of iterative reconstruction technique. COMPARISON:  None Available. FINDINGS: CT CHEST FINDINGS Cardiovascular: Normal heart size. No significant pericardial effusion. The thoracic aorta is normal in caliber. No atherosclerotic plaque of the thoracic aorta. No coronary artery calcifications. Mediastinum/Nodes: No gross hilar adenopathy, noting limited sensitivity for the detection of hilar adenopathy on this noncontrast study. No enlarged mediastinal or axillary lymph nodes. Debris within the trachea. Thyroid and esophagus demonstrate no significant findings. Lungs/Pleura: Lower lobe predominant patchy ground-glass airspace opacities that are peripheral and peribronchovascular. No pulmonary nodule. No pulmonary mass. No pleural effusion. No pneumothorax. Musculoskeletal: No chest wall abnormality. No suspicious lytic or blastic osseous lesions. No acute displaced fracture. Multilevel degenerative changes of the spine. Limited evaluation of the ribs and sternum due to motion artifact. CT ABDOMEN PELVIS FINDINGS Hepatobiliary: No focal liver abnormality. Status post cholecystectomy. No biliary dilatation. Pancreas: No focal lesion. Normal pancreatic contour. No surrounding inflammatory changes. No main pancreatic ductal dilatation. Spleen: Normal in size without focal abnormality. Adrenals/Urinary Tract: No adrenal nodule bilaterally. Status post right nephrectomy. No hydronephrosis. 3 mm left nephrolithiasis. No ureterolithiasis or hydroureter. Foley catheter tip and balloon terminate within urinary bladder lumen. The urinary bladder is decompressed. Stomach/Bowel:  Stomach is within normal limits. No evidence of bowel wall thickening or dilatation. Appendix appears normal. Vascular/Lymphatic: No abdominal aorta or iliac aneurysm. Mild atherosclerotic plaque of the aorta and its branches. No abdominal, pelvic, or inguinal lymphadenopathy. Reproductive: T-shaped intrauterine device in appropriate position within the uterus. Uterus and bilateral adnexa are unremarkable. Other: No intraperitoneal free fluid. No intraperitoneal free gas. No organized fluid collection. Musculoskeletal: No abdominal wall hernia or abnormality. No suspicious lytic or blastic osseous lesions. No acute displaced fracture. Multilevel degenerative changes of the spine. IMPRESSION: 1. Lower lobe predominant patchy ground-glass airspace opacities that are peripheral and peribronchovascular. Findings consistent inflammation (aspiration pneumonia)/infection (COVID-19 not excluded). Recommend CT chest in 3 months to evaluate for resolution. 2. Debris within the trachea. 3. Nonobstructive 3 mm left nephrolithiasis. 4. Right nephrectomy. 5. T-shaped intrauterine device in appropriate position within the uterus. Electronically Signed   By:  Tish Frederickson M.D.   On: 02/13/2022 18:59   DG Chest 1 View  Result Date: 02/13/2022 CLINICAL DATA:  Shortness of breath, cough EXAM: CHEST  1 VIEW COMPARISON:  Previous studies including chest radiograph done on 11/03/2015 and CT done on 03/30/2017 FINDINGS: There is poor inspiration. New patchy infiltrates are seen in right parahilar region and both lower lung fields. Lateral CP angles are clear. There is no pneumothorax. Patient's chin is partly obscuring both apices. IMPRESSION: New patchy infiltrates are seen in right mid and both lower lung fields suggesting multifocal pneumonia. Electronically Signed   By: Ernie Avena M.D.   On: 02/13/2022 16:16    Microbiology: Recent Results (from the past 240 hour(s))  Urine Culture     Status: None   Collection  Time: 02/23/22  8:50 AM   Specimen: Urine, Catheterized  Result Value Ref Range Status   Specimen Description   Final    URINE, CATHETERIZED Performed at [facility], [address] [place], [place] [address]    Special Requests   Final    NONE Performed at [facility], [address], [place], [place] [address]    Culture   Final    NO GROWTH Performed at [facility], [address], [place], [place] [address]    Report Status 02/24/2022 FINAL  Final     Labs: Basic Metabolic Panel: Recent Labs  Lab 02/25/22 0508 02/26/22 0648 02/27/22 0607 02/28/22 0633  NA 140 135 135 137  K 3.1* 3.5 2.8* 3.5  CL 114* 112* 111 113*  CO2 18* 15* 17* 18*  GLUCOSE 113* 98 90 94  BUN 18 11 14 13   CREATININE 0.63 0.55 0.60 0.69  CALCIUM 8.4* 8.1* 8.0* 8.3*  MG  --   --  1.6* 2.1   Liver Function Tests: No results for input(s): "AST", "ALT", "ALKPHOS", "BILITOT", "PROT", "ALBUMIN" in the last 168 hours. No results for input(s): "LIPASE", "AMYLASE" in the last 168 hours. No results for input(s): "AMMONIA" in the last 168 hours. CBC: Recent Labs  Lab 02/25/22 0508 02/26/22 0648 02/28/22 1249  WBC 12.3* 9.3 9.2  HGB 8.6* 9.2* 9.5*  HCT 28.3* 30.0* 31.2*  MCV 101.1* 99.3 100.6*  PLT 412* 451* 564*   Cardiac Enzymes: No results for input(s): "CKTOTAL", "CKMB", "CKMBINDEX", "TROPONINI" in the last 168 hours. BNP: BNP (last 3 results) No results for input(s): "BNP" in the last 8760 hours.  ProBNP (last 3 results) No results for input(s): "PROBNP" in the last 8760 hours.  CBG: No results for input(s): "GLUCAP" in the last 168 hours.     Signed:  03/02/22 MD   [facility] 03/03/2022, 8:31 AM

## 2022-04-10 NOTE — Progress Notes (Unsigned)
Synopsis: Referred for dyspnea by Duwaine Maxin, DO  Subjective:   PATIENT ID: Alice [name] GENDER: female DOB: [birth year], MRN: [id number]  Chief Complaint  Patient presents with   Pulmonary Consult    Referred by Dr. Patrecia Pace. Pt c/o SOB since Oct 2023. She gets winded walking room to room. She has a prod cough with grey, occ blood tinged sputum.    37yF with history of severe obesity, GERD, loin pain hematuria s/p nephrectomy recent admission to ICU for acute hypoxic respiratory failure due to influenza B pneumonia, possible superimposed CAP requiring intubation.  Had another admission 03/24/22 at [facility]  She was treated for UTI with nonobstructing stone in left kidney.   She now has very productive cough- lots of mucus, greenish, occasionally blood tinged. She has severe DOE. This has been going on since maybe a week since she was discharged from [facility]. She says she usually feels better when she's on antibiotics then worse when she's off them.  Had covid-19 a couple times but was never hospitalized for it.   Otherwise pertinent review of systems is negative.  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 Was in law enforcement distant past. She smoked for 8 years - half ppd, quit 11/2021. No vaping, MJ. She has a cat.   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 Amalia Hailey at [facility]   Morbid obesity [facility])    Preterm labor    Round ligament pain 09/10/2012   Torn ACL      Family History  Problem Relation Age of Onset   Hypertension Mother     Hyperlipidemia Mother    Heart disease Mother    Heart disease Father        MI   Alcohol abuse Father    Alcohol abuse Brother    Diabetes Maternal Grandmother    Hyperlipidemia Maternal Grandmother      Past Surgical History:  Procedure Laterality Date   addenoidectomy     CESAREAN SECTION N/A 02/22/2013   Procedure: CESAREAN SECTION/TWINS;  Surgeon: Florian Buff, MD;  Location: [facility];  Service: Obstetrics;  Laterality: N/A;   CHOLECYSTECTOMY     GALLBLADDER SURGERY  2019   NASAL SINUS SURGERY     NEPHRECTOMY     right side   OTHER SURGICAL HISTORY     ulner nerve removal   TONSILLECTOMY      Social History   Socioeconomic History   Marital status: Married    Spouse name: Not on file   Number of children: Not on file   Years of education: Not on file   Highest education level: Not on file  Occupational History   Not on file  Tobacco Use   Smoking status: Former    Packs/day: 0.50    Years: 8.00    Total pack years: 4.00    Types: Cigarettes    Start date: 02/14/2013    Quit date: 12/15/2021    Years since  quitting: 0.3   Smokeless tobacco: Never  Vaping Use   Vaping Use: Never used  Substance and Sexual Activity   Alcohol use: No   Drug use: No   Sexual activity: Yes    Partners: Male    Birth control/protection: None  Other Topics Concern   Not on file  Social History Narrative   Not on file   Social Determinants of Health   Financial Resource Strain: Not on file  Food Insecurity: No Food Insecurity (02/17/2022)   Hunger Vital Sign    Worried About Running Out of Food in the Last Year: Never true    Ran Out of Food in the Last Year: Never true  Transportation Needs: Not on file  Physical Activity: Not on file  Stress: Not on file  Social Connections: Not on file  Intimate Partner Violence: Not on file     Allergies  Allergen Reactions   Cinnamon Anaphylaxis    Cinnamon flavoring    Other Swelling    Artificial cinnamon causes tongue to swell    Reglan [Metoclopramide] Other (See Comments)    Tactile disturbances REACTION: causes skin to "crawl"   Cinnamon Flavor Swelling    Artificial cinnamon causes tongue to swell   Codeine Nausea Only    Straight codeine only Percocet & vicodin are tolerated by pt.   Flomax [Tamsulosin] Other (See Comments)    Other reaction(s): Other (See Comments) Oral sores Oral sores   Ibuprofen Other (See Comments)    Stomach pain Causes severe pain in stomach Pt can tolerate if used sparingly   Levofloxacin Hives   Tylenol [Acetaminophen] Other (See Comments)    Pt reports Tylenol causes her kidney to bleed.     Outpatient Medications Prior to Visit  Medication Sig Dispense Refill   cholecalciferol (VITAMIN D3) 25 MCG (1000 UNIT) tablet Take by mouth.     Ferrous Sulfate (IRON SUPPLEMENT PO) Take 1 tablet by mouth in the morning and at bedtime.     gabapentin (NEURONTIN) 100 MG capsule Take 1 capsule (100 mg total) by mouth 2 (two) times daily. 60 capsule 0   metoprolol tartrate (LOPRESSOR) 25 MG tablet Take 1 tablet (25 mg total) by mouth 2 (two) times daily. 60 tablet 3   montelukast (SINGULAIR) 10 MG tablet Take by mouth.     naloxone (NARCAN) nasal spray 4 mg/0.1 mL      nystatin (MYCOSTATIN/NYSTOP) powder Apply topically 3 times daily. 15 g 0   OLANZapine (ZYPREXA) 10 MG tablet Take 1 tablet (10 mg total) by mouth at bedtime. 30 tablet 1   omeprazole (PRILOSEC) 40 MG capsule Take 1 capsule (40 mg total) by mouth daily. 30 capsule 0   oxyCODONE (OXY IR/ROXICODONE) 5 MG immediate release tablet Take 1 tablet (5 mg total) by mouth every 6 (six) hours as needed for moderate pain or severe pain. 30 tablet 0   pravastatin (PRAVACHOL) 80 MG tablet Take by mouth.     SUMAtriptan (IMITREX) 25 MG tablet Take 25 mg by mouth as needed.     clotrimazole-betamethasone (LOTRISONE) cream Apply topically 2 (two) times daily.     methocarbamol (ROBAXIN) 500 MG tablet Take by mouth.     nystatin  (MYCOSTATIN/NYSTOP) powder Apply 1 Application topically 2 (two) times daily.     phentermine (ADIPEX-P) 37.5 MG tablet Take 37.5 mg by mouth every morning.     Silver-Carboxymethylcellulose (AQUACEL AG ADVANTAGE) 4"X5" PADS Apply 30 each topically once a week. 30 each 0  Skin Protectants, Misc. (INTERDRY [id number]") SHEE Apply 30 each topically once a week. 30 each 0   Sodium Hypochlorite 0.0125 % SOLN Apply topically.     No facility-administered medications prior to visit.       Objective:   Physical Exam:  General appearance: 37 y.o., female, NAD, conversant  Eyes: anicteric sclerae; PERRL, tracking appropriately HENT: NCAT; MMM Neck: Trachea midline; no lymphadenopathy, no JVD Lungs: CTAB, no crackles, no wheeze, with normal respiratory effort CV: RRR, no murmur  Abdomen: Soft, non-tender; non-distended, BS present  Extremities: No peripheral edema, warm Skin: Normal turgor and texture; no rash Psych: Appropriate affect Neuro: Alert and oriented to person and place, no focal deficit     Vitals:   04/11/22 1340  BP: 116/72  Pulse: 91  Temp: 97.9 F (36.6 C)  TempSrc: Oral  SpO2: 98%  Weight: (!) 301 lb (136.5 kg)  Height: '5\' 8"'$  (1.727 m)   98% on  RA BMI Readings from Last 3 Encounters:  04/11/22 45.77 kg/m  03/03/22 48.96 kg/m  01/24/19 54.49 kg/m   Wt Readings from Last 3 Encounters:  04/11/22 (!) 301 lb (136.5 kg)  03/03/22 (!) 331 lb 9.2 oz (150.4 kg)  01/24/19 (!) 369 lb (167.4 kg)     CBC    Component Value Date/Time   WBC 7.8 03/03/2022 0747   RBC 2.88 (L) 03/03/2022 0747   HGB 9.0 (L) 03/03/2022 0747   HGB 13.2 07/13/2018 0854   HCT 28.8 (L) 03/03/2022 0747   HCT 41.4 07/13/2018 0854   PLT 524 (H) 03/03/2022 0747   PLT 338 07/13/2018 0854   MCV 100.0 03/03/2022 0747   MCV 99 (H) 07/13/2018 0854   MCH 31.3 03/03/2022 0747   MCHC 31.3 03/03/2022 0747   RDW 18.0 (H) 03/03/2022 0747   RDW 14.2 07/13/2018 0854   LYMPHSABS 3.5 03/03/2022  0747   LYMPHSABS 2.3 07/13/2018 0854   MONOABS 0.7 03/03/2022 0747   EOSABS 0.3 03/03/2022 0747   EOSABS 0.6 (H) 07/13/2018 0854   BASOSABS 0.1 03/03/2022 0747   BASOSABS 0.1 07/13/2018 0854      Chest Imaging: CT CAP with widespread peripheral and basilar predominant ggo/consolidative opacities  CTA Chest 03/24/22 a few residual foci of TIB nodularity, scar  CXR 04/11/22 clear lungs  Pulmonary Functions Testing Results:     No data to display            Echocardiogram:    1. Left ventricular ejection fraction, by estimation, is >75%. The left  ventricle has hyperdynamic function. The left ventricle has no regional  wall motion abnormalities. Indeterminate diastolic filling due to E-A  fusion.   2. Right ventricular systolic function is mildly reduced. The right  ventricular size is mildly enlarged. Tricuspid regurgitation signal is  inadequate for assessing PA pressure.   3. The mitral valve is normal in structure. No evidence of mitral valve  regurgitation. No evidence of mitral stenosis.   4. The aortic valve is normal in structure. Aortic valve regurgitation is  not visualized. No aortic stenosis is present.   5. The inferior vena cava is normal in size with greater than 50%  respiratory variability, suggesting right atrial pressure of 3 mmHg.       Assessment & Plan:   # DOE # Chronic cough Probably multifactorial issues. Possibility of asthma with mucus plugs. Obesity, deconditioning following prolonged ICU stay/mech vent likely playing role. Diastolic dysfunction possible though not overtly hypervolemic on exam. Though she had ARDS  it doesn't appear she has significant radiogrphic extent of residual scarring thankfully.  # Hoarseness  Plan: - try breztri 2 puffs twice dailyEVERYDAY rinse mouth and brush teeth/tongue after each use to prevent yeast infection in throat - albuterol 1-2 puffs as needed and can use 5-20 minutes before exercise - mucinex 600 mg   to 1200 mg twice daily to thin mucus when you feel like you have a lot of chest congestion - ENT referral placed today for hoarseness given that you were on ventilator for so long - see you in 8 weeks with breathing tests or sooner if need be - discuss possibility of sleep disordered breathing next visit    Maryjane Hurter, MD [facility] 04/11/2022 2:20 PM

## 2022-04-11 ENCOUNTER — Encounter: Payer: Self-pay | Admitting: Student

## 2022-04-11 ENCOUNTER — Ambulatory Visit (INDEPENDENT_AMBULATORY_CARE_PROVIDER_SITE_OTHER): Payer: Medicaid Other | Admitting: Student

## 2022-04-11 ENCOUNTER — Ambulatory Visit (INDEPENDENT_AMBULATORY_CARE_PROVIDER_SITE_OTHER): Payer: Medicaid Other

## 2022-04-11 VITALS — BP 116/72 | HR 91 | Temp 97.9°F | Ht 68.0 in | Wt 301.0 lb

## 2022-04-11 DIAGNOSIS — R053 Chronic cough: Secondary | ICD-10-CM

## 2022-04-11 DIAGNOSIS — R0609 Other forms of dyspnea: Secondary | ICD-10-CM | POA: Diagnosis not present

## 2022-04-11 MED ORDER — BREZTRI AEROSPHERE 160-9-4.8 MCG/ACT IN AERO: 2.0000 | INHALATION_SPRAY | Freq: Two times a day (BID) | RESPIRATORY_TRACT | 0 refills | Status: AC

## 2022-04-11 MED ORDER — ALBUTEROL SULFATE HFA 108 (90 BASE) MCG/ACT IN AERS: 2.0000 | INHALATION_SPRAY | Freq: Four times a day (QID) | RESPIRATORY_TRACT | 6 refills | Status: AC | PRN

## 2022-04-11 NOTE — Patient Instructions (Signed)
-   try breztri 2 puffs twice dailyEVERYDAY rinse mouth and brush teeth/tongue after each use to prevent yeast infection in throat - albuterol 1-2 puffs as needed and can use 5-20 minutes before exercise - mucinex 600 mg  to 1200 mg twice daily to thin mucus when you feel like you have a lot of chest congestion - ENT referral placed today for hoarseness given that you were on ventilator for so long - see you in 8 weeks with breathing tests or sooner if need be!

## 2022-05-02 NOTE — Progress Notes (Incomplete)
H&P  Chief Complaint: ***  History of Present Illness: Alice [name] is a 37 y.o. 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 Amalia Hailey at [place] [facility])    Preterm labor    Round ligament pain 09/10/2012   Torn ACL     Past Surgical History:  Procedure Laterality Date   addenoidectomy     CESAREAN SECTION N/A 02/22/2013   Procedure: CESAREAN SECTION/TWINS;  Surgeon: Florian Buff, MD;  Location: [facility];  Service: Obstetrics;  Laterality: N/A;   CHOLECYSTECTOMY     GALLBLADDER SURGERY  2019   NASAL SINUS SURGERY     NEPHRECTOMY     right side   OTHER SURGICAL HISTORY     ulner nerve removal   TONSILLECTOMY      Home Medications:  (Not in a hospital admission)   Allergies:  Allergies  Allergen Reactions   Cinnamon Anaphylaxis    Cinnamon flavoring    Other Swelling    Artificial cinnamon causes tongue to swell   Reglan [Metoclopramide] Other (See Comments)    Tactile disturbances REACTION: causes skin to "crawl"   Cinnamon Flavor Swelling    Artificial cinnamon causes tongue to swell   Codeine Nausea Only    Straight codeine only Percocet & vicodin are tolerated by pt.   Flomax [Tamsulosin] Other (See Comments)    Other reaction(s): Other (See Comments) Oral sores Oral sores   Ibuprofen Other (See Comments)    Stomach pain Causes severe pain in stomach Pt can tolerate if used sparingly   Levofloxacin Hives   Tylenol [Acetaminophen] Other (See Comments)    Pt reports Tylenol causes her kidney to bleed.    Family History  Problem Relation Age of Onset   Hypertension Mother     Hyperlipidemia Mother    Heart disease Mother    Heart disease Father        MI   Alcohol abuse Father    Alcohol abuse Brother    Diabetes Maternal Grandmother    Hyperlipidemia Maternal Grandmother     Social History:  reports that she quit smoking about 4 months ago. Her smoking use included cigarettes. She started smoking about 9 years ago. She has a 4.00 pack-year smoking history. She has never used smokeless tobacco. She reports that she does not drink alcohol and does not use drugs.  ROS: A complete review of systems was performed.  All systems are negative except for pertinent findings as noted.  Physical Exam:  Vital signs in last 24 hours: @VSRANGES @ General:  Alert and oriented, No acute distress HEENT: Normocephalic, atraumatic Neck: No JVD or lymphadenopathy Cardiovascular: Regular rate  Lungs: Normal inspiratory/expiratory excursion Abdomen: Soft, nontender, nondistended, no abdominal masses Back: No CVA tenderness Extremities: No edema Neurologic: Grossly intact  I have reviewed prior pt notes  I have reviewed notes from referring/previous physicians  I have reviewed urinalysis results  I have independently reviewed prior imaging  I have reviewed prior urine culture   Impression/Assessment:  ***  Plan:  ***  Alice [name] 05/02/2022, 8:22 PM  Alice Boxer. Trexton Escamilla MD

## 2022-05-03 ENCOUNTER — Ambulatory Visit: Payer: Medicaid Other | Admitting: Urology

## 2022-05-03 DIAGNOSIS — Z905 Acquired absence of kidney: Secondary | ICD-10-CM

## 2022-05-03 DIAGNOSIS — N3000 Acute cystitis without hematuria: Secondary | ICD-10-CM

## 2022-06-17 ENCOUNTER — Ambulatory Visit (INDEPENDENT_AMBULATORY_CARE_PROVIDER_SITE_OTHER): Payer: Medicaid Other | Admitting: Urology

## 2022-06-17 VITALS — BP 110/73 | HR 85

## 2022-06-17 DIAGNOSIS — N390 Urinary tract infection, site not specified: Secondary | ICD-10-CM | POA: Diagnosis not present

## 2022-06-17 DIAGNOSIS — R310 Gross hematuria: Secondary | ICD-10-CM | POA: Diagnosis not present

## 2022-06-17 DIAGNOSIS — N200 Calculus of kidney: Secondary | ICD-10-CM | POA: Diagnosis not present

## 2022-06-17 DIAGNOSIS — Z905 Acquired absence of kidney: Secondary | ICD-10-CM | POA: Diagnosis not present

## 2022-06-17 LAB — URINALYSIS, ROUTINE W REFLEX MICROSCOPIC
Bilirubin, UA: NEGATIVE
Glucose, UA: NEGATIVE
Ketones, UA: NEGATIVE
Leukocytes,UA: NEGATIVE
Nitrite, UA: NEGATIVE
Protein,UA: NEGATIVE
RBC, UA: NEGATIVE
Specific Gravity, UA: <1.005 — ABNORMAL LOW (ref 1.005–1.030)
Urobilinogen, Ur: 0.2 mg/dL (ref 0.2–1.0)
pH, UA: 6.5 (ref 5.0–7.5)

## 2022-06-17 LAB — BLADDER SCAN AMB NON-IMAGING: Scan Result: 85

## 2022-06-17 MED ORDER — NITROFURANTOIN MACROCRYSTAL 50 MG PO CAPS: 50.0000 mg | ORAL_CAPSULE | Freq: Every day | ORAL | 11 refills | Status: AC

## 2022-06-17 NOTE — Progress Notes (Unsigned)
post void residual=85

## 2022-06-17 NOTE — Progress Notes (Unsigned)
06/17/2022 10:33 AM   MECO BAZER 08/21/1985 [id number]  Referring provider: Margarita Mail, DO [address] [place],  [place] [address]  Frequent UTI and gross hematuria    HPI: [name] is a 37yo here for evaluation of frequenct UTI, nephrolithiasis and gross hematuria. She has a known 3-4 mm left renal calculus in a solitary kidney. She gets 7-8 UTIs per year. She usually takes macrobid for her UTIs. 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 [name] at [facility]   Morbid obesity [facility])    Preterm labor    Round ligament pain 09/10/2012   Torn ACL     Surgical History: Past Surgical History:  Procedure Laterality Date   addenoidectomy     CESAREAN SECTION N/A 02/22/2013   Procedure: CESAREAN SECTION/TWINS;  Surgeon: [name], MD;  Location: [facility];  Service: Obstetrics;  Laterality: N/A;   CHOLECYSTECTOMY     GALLBLADDER SURGERY  2019   NASAL SINUS SURGERY     NEPHRECTOMY     right side   OTHER SURGICAL HISTORY     ulner nerve removal   TONSILLECTOMY      Home Medications:  Allergies as of 06/17/2022       Reactions   Cinnamon Anaphylaxis   Cinnamon flavoring   Other Swelling   Artificial cinnamon causes tongue to swell   Reglan [metoclopramide] Other (See Comments)   Tactile disturbances REACTION: causes skin to "crawl"   Cinnamon Flavor Swelling   Artificial cinnamon causes tongue to swell   Codeine Nausea Only   Straight codeine only Percocet & vicodin are  tolerated by pt.   Flomax [tamsulosin] Other (See Comments)   Other reaction(s): Other (See Comments) Oral sores Oral sores   Ibuprofen Other (See Comments)   Stomach pain Causes severe pain in stomach Pt can tolerate if used sparingly   Levofloxacin Hives   Tylenol [acetaminophen] Other (See Comments)   Pt reports Tylenol causes her kidney to bleed.        Medication List        Accurate as of Jun 17, 2022 10:33 AM. If you have any questions, ask your nurse or doctor.          albuterol 108 (90 Base) MCG/ACT inhaler Commonly known as: VENTOLIN HFA Inhale 2 puffs into the lungs every 6 (six) hours as needed for wheezing or shortness of breath.   Breztri Aerosphere 160-9-4.8 MCG/ACT Aero Generic drug: Budeson-Glycopyrrol-Formoterol Inhale 2 puffs into the lungs in the morning and at bedtime.   cholecalciferol 25 MCG (1000 UNIT) tablet Commonly known as: VITAMIN D3 Take by mouth.   gabapentin 100 MG capsule Commonly known as: NEURONTIN Take 1 capsule (100 mg total) by mouth 2 (two) times daily.   IRON SUPPLEMENT PO Take 1 tablet by mouth in the morning and at bedtime.   metoprolol tartrate 25 MG tablet Commonly known  as: LOPRESSOR Take 1 tablet (25 mg total) by mouth 2 (two) times daily.   montelukast 10 MG tablet Commonly known as: SINGULAIR Take by mouth.   naloxone 4 MG/0.1ML Liqd nasal spray kit Commonly known as: NARCAN   nystatin powder Commonly known as: MYCOSTATIN/NYSTOP Apply topically 3 times daily.   OLANZapine 10 MG tablet Commonly known as: ZYPREXA Take 1 tablet (10 mg total) by mouth at bedtime.   omeprazole 40 MG capsule Commonly known as: PRILOSEC Take 1 capsule (40 mg total) by mouth daily.   oxyCODONE 5 MG immediate release tablet Commonly known as: Oxy IR/ROXICODONE Take 1 tablet (5 mg total) by mouth every 6 (six) hours as needed for moderate pain or severe pain.   pravastatin 80 MG tablet Commonly known as: PRAVACHOL Take by  mouth.   SUMAtriptan 25 MG tablet Commonly known as: IMITREX Take 25 mg by mouth as needed.        Allergies:  Allergies  Allergen Reactions   Cinnamon Anaphylaxis    Cinnamon flavoring    Other Swelling    Artificial cinnamon causes tongue to swell   Reglan [Metoclopramide] Other (See Comments)    Tactile disturbances REACTION: causes skin to "crawl"   Cinnamon Flavor Swelling    Artificial cinnamon causes tongue to swell   Codeine Nausea Only    Straight codeine only Percocet & vicodin are tolerated by pt.   Flomax [Tamsulosin] Other (See Comments)    Other reaction(s): Other (See Comments) Oral sores Oral sores   Ibuprofen Other (See Comments)    Stomach pain Causes severe pain in stomach Pt can tolerate if used sparingly   Levofloxacin Hives   Tylenol [Acetaminophen] Other (See Comments)    Pt reports Tylenol causes her kidney to bleed.    Family History: Family History  Problem Relation Age of Onset   Hypertension Mother    Hyperlipidemia Mother    Heart disease Mother    Heart disease Father        MI   Alcohol abuse Father    Alcohol abuse Brother    Diabetes Maternal Grandmother    Hyperlipidemia Maternal Grandmother     Social History:  reports that she quit smoking about 6 months ago. Her smoking use included cigarettes. She started smoking about 9 years ago. She has a 4.00 pack-year smoking history. She has never used smokeless tobacco. She reports that she does not drink alcohol and does not use drugs.  ROS: All other review of systems were reviewed and are negative except what is noted above in HPI  Physical Exam: BP 110/73   Pulse 85   Constitutional:  Alert and oriented, No acute distress. HEENT: [place] AT, moist mucus membranes.  Trachea midline, no masses. Cardiovascular: No clubbing, cyanosis, or edema. Respiratory: Normal respiratory effort, no increased work of breathing. GI: Abdomen is soft, nontender, nondistended, no abdominal  masses GU: No CVA tenderness.  Lymph: No cervical or inguinal lymphadenopathy. Skin: No rashes, bruises or suspicious lesions. Neurologic: Grossly intact, no focal deficits, moving all 4 extremities. Psychiatric: Normal mood and affect.  Laboratory Data: Lab Results  Component Value Date   WBC 7.8 03/03/2022   HGB 9.0 (L) 03/03/2022   HCT 28.8 (L) 03/03/2022   MCV 100.0 03/03/2022   PLT 524 (H) 03/03/2022    Lab Results  Component Value Date   CREATININE 0.73 03/03/2022    No results found for: "PSA"  No results found for: "TESTOSTERONE"  No results found for: "  HGBA1C"  Urinalysis    Component Value Date/Time   COLORURINE YELLOW 02/20/2022 1030   APPEARANCEUR CLOUDY (A) 02/20/2022 1030   APPEARANCEUR Cloudy (A) 04/07/2017 1104   LABSPEC 1.017 02/20/2022 1030   PHURINE 6.0 02/20/2022 1030   GLUCOSEU NEGATIVE 02/20/2022 1030   HGBUR MODERATE (A) 02/20/2022 1030   BILIRUBINUR NEGATIVE 02/20/2022 1030   BILIRUBINUR Negative 04/07/2017 1104   KETONESUR NEGATIVE 02/20/2022 1030   PROTEINUR 100 (A) 02/20/2022 1030   UROBILINOGEN 0.2 02/01/2014 1036   NITRITE NEGATIVE 02/20/2022 1030   LEUKOCYTESUR LARGE (A) 02/20/2022 1030    Lab Results  Component Value Date   LABMICR <3.0 07/13/2018   WBCUA 11-30 (A) 04/07/2017   RBCUA 0-2 04/07/2017   LABEPIT >10 (A) 04/07/2017   BACTERIA RARE (A) 02/20/2022    Pertinent Imaging: *** Results for orders placed during the hospital encounter of 02/13/22  DG Abd 1 View  Narrative CLINICAL DATA:  OG tube placement  EXAM: ABDOMEN - 1 VIEW  COMPARISON:  02/14/2022  FINDINGS: Esophageal tube tip overlies the proximal to mid stomach, side-port probably in the region of or just distal to GE junction. Upper gas pattern is unremarkable  IMPRESSION: Esophageal tube tip overlies the proximal to mid stomach.   Electronically Signed By: Jasmine Pang M.D. On: 02/14/2022 22:14  No results found for this or any previous  visit.  No results found for this or any previous visit.  No results found for this or any previous visit.  Results for orders placed during the hospital encounter of 07/01/13  US Renal  Narrative CLINICAL DATA:  Left-sided pain.  Hematuria.  Solitary left kidney.  EXAM: RENAL/URINARY TRACT ULTRASOUND COMPLETE  COMPARISON:  Abdominal CT 10/18/2011  FINDINGS: Right Kidney:  Surgically absent  Left Kidney:  Length: 14 cm, likely compensatory hypertrophy. Left pelviectasis without definitive hydronephrosis. No visible stone. No evidence of solid mass.  Bladder:  Ureteral jet not captured. The bladder is moderately distended, consistent with left ureteral patency.  IMPRESSION: 1. Left pelviectasis. 2. Ureteral jet was not visualized, but there is moderate distention of the bladder that is consistent with left ureteral patency (status post right nephrectomy).   Electronically Signed By: Tiburcio Pea M.D. On: 07/01/2013 15:41  No valid procedures specified. No results found for this or any previous visit.  Results for orders placed during the hospital encounter of 09/19/17  CT Renal Stone Study  Narrative CLINICAL DATA:  Pelvic pain and hematuria  EXAM: CT ABDOMEN AND PELVIS WITHOUT CONTRAST  TECHNIQUE: Multidetector CT imaging of the abdomen and pelvis was performed following the standard protocol without IV contrast.  COMPARISON:  None.  FINDINGS: Lower chest: No acute abnormality.  Hepatobiliary: No focal liver abnormality is seen. Status post cholecystectomy. No biliary dilatation.  Pancreas: Unremarkable. No pancreatic ductal dilatation or surrounding inflammatory changes.  Spleen: Normal in size without focal abnormality.  Adrenals/Urinary Tract: The right kidney is been surgically removed. The adrenal glands are within normal limits bilaterally. The left kidney demonstrates no renal calculi or obstructive changes. The bladder is  partially distended.  Stomach/Bowel: Stomach is within normal limits. Appendix appears normal. No evidence of bowel wall thickening, distention, or inflammatory changes.  Vascular/Lymphatic: No significant vascular findings are present. No enlarged abdominal or pelvic lymph nodes.  Reproductive: Uterus and bilateral adnexa are unremarkable.  Other: No abdominal wall hernia or abnormality. No abdominopelvic ascites.  Musculoskeletal: No acute or significant osseous findings.  IMPRESSION: Status post right nephrectomy.  No acute abnormality  noted.   Electronically Signed By: Alcide Clever M.D. On: 09/19/2017 14:09   Assessment & Plan:    1. Frequent UTI *** - Urinalysis, Routine w reflex microscopic - BLADDER SCAN AMB NON-IMAGING  2. Gross hematuria ***   No follow-ups on file.  Wilkie Aye, MD  [facility]

## 2022-06-21 ENCOUNTER — Encounter: Payer: Self-pay | Admitting: Urology

## 2022-06-21 NOTE — Patient Instructions (Signed)
Urinary Tract Infection, Adult  A urinary tract infection (UTI) is an infection of any part of the urinary tract. The urinary tract includes the kidneys, ureters, bladder, and urethra. These organs make, store, and get rid of urine in the body. An upper UTI affects the ureters and kidneys. A lower UTI affects the bladder and urethra. What are the causes? Most urinary tract infections are caused by bacteria in your genital area around your urethra, where urine leaves your body. These bacteria grow and cause inflammation of your urinary tract. What increases the risk? You are more likely to develop this condition if: You have a urinary catheter that stays in place. You are not able to control when you urinate or have a bowel movement (incontinence). You are female and you: Use a spermicide or diaphragm for birth control. Have low estrogen levels. Are pregnant. You have certain genes that increase your risk. You are sexually active. You take antibiotic medicines. You have a condition that causes your flow of urine to slow down, such as: An enlarged prostate, if you are female. Blockage in your urethra. A kidney stone. A nerve condition that affects your bladder control (neurogenic bladder). Not getting enough to drink, or not urinating often. You have certain medical conditions, such as: Diabetes. A weak disease-fighting system (immunesystem). Sickle cell disease. Gout. Spinal cord injury. What are the signs or symptoms? Symptoms of this condition include: Needing to urinate right away (urgency). Frequent urination. This may include small amounts of urine each time you urinate. Pain or burning with urination. Blood in the urine. Urine that smells bad or unusual. Trouble urinating. Cloudy urine. Vaginal discharge, if you are female. Pain in the abdomen or the lower back. You may also have: Vomiting or a decreased appetite. Confusion. Irritability or tiredness. A fever or  chills. Diarrhea. The first symptom in older adults may be confusion. In some cases, they may not have any symptoms until the infection has worsened. How is this diagnosed? This condition is diagnosed based on your medical history and a physical exam. You may also have other tests, including: Urine tests. Blood tests. Tests for STIs (sexually transmitted infections). If you have had more than one UTI, a cystoscopy or imaging studies may be done to determine the cause of the infections. How is this treated? Treatment for this condition includes: Antibiotic medicine. Over-the-counter medicines to treat discomfort. Drinking enough water to stay hydrated. If you have frequent infections or have other conditions such as a kidney stone, you may need to see a health care provider who specializes in the urinary tract (urologist). In rare cases, urinary tract infections can cause sepsis. Sepsis is a life-threatening condition that occurs when the body responds to an infection. Sepsis is treated in the hospital with IV antibiotics, fluids, and other medicines. Follow these instructions at home:  Medicines Take over-the-counter and prescription medicines only as told by your health care provider. If you were prescribed an antibiotic medicine, take it as told by your health care provider. Do not stop using the antibiotic even if you start to feel better. General instructions Make sure you: Empty your bladder often and completely. Do not hold urine for long periods of time. Empty your bladder after sex. Wipe from front to back after urinating or having a bowel movement if you are female. Use each tissue only one time when you wipe. Drink enough fluid to keep your urine pale yellow. Keep all follow-up visits. This is important. Contact a health   care provider if: Your symptoms do not get better after 1-2 days. Your symptoms go away and then return. Get help right away if: You have severe pain in  your back or your lower abdomen. You have a fever or chills. You have nausea or vomiting. Summary A urinary tract infection (UTI) is an infection of any part of the urinary tract, which includes the kidneys, ureters, bladder, and urethra. Most urinary tract infections are caused by bacteria in your genital area. Treatment for this condition often includes antibiotic medicines. If you were prescribed an antibiotic medicine, take it as told by your health care provider. Do not stop using the antibiotic even if you start to feel better. Keep all follow-up visits. This is important. This information is not intended to replace advice given to you by your health care provider. Make sure you discuss any questions you have with your health care provider. Document Revised: 09/13/2019 Document Reviewed: 09/13/2019 Elsevier Patient Education  2023 [employer]  

## 2022-07-14 ENCOUNTER — Encounter (HOSPITAL_COMMUNITY): Payer: Self-pay | Admitting: Physical Therapy

## 2022-07-14 ENCOUNTER — Other Ambulatory Visit: Payer: Self-pay

## 2022-07-14 ENCOUNTER — Ambulatory Visit (HOSPITAL_COMMUNITY): Payer: Medicaid Other | Attending: Physician Assistant | Admitting: Physical Therapy

## 2022-07-14 DIAGNOSIS — M25561 Pain in right knee: Secondary | ICD-10-CM | POA: Diagnosis present

## 2022-07-14 DIAGNOSIS — M5459 Other low back pain: Secondary | ICD-10-CM | POA: Diagnosis present

## 2022-07-14 DIAGNOSIS — M25562 Pain in left knee: Secondary | ICD-10-CM | POA: Insufficient documentation

## 2022-07-14 DIAGNOSIS — M6281 Muscle weakness (generalized): Secondary | ICD-10-CM | POA: Diagnosis present

## 2022-07-14 DIAGNOSIS — R2689 Other abnormalities of gait and mobility: Secondary | ICD-10-CM | POA: Diagnosis present

## 2022-07-14 DIAGNOSIS — R29898 Other symptoms and signs involving the musculoskeletal system: Secondary | ICD-10-CM | POA: Insufficient documentation

## 2022-07-14 NOTE — Therapy (Signed)
OUTPATIENT PHYSICAL THERAPY THORACOLUMBAR EVALUATION   Patient Name: Alice [name] MRN: [id number] DOB:04-May-1985, 37 y.o., female Today's Date: 07/14/2022  END OF SESSION:  PT End of Session - 07/14/22 0850     Visit Number 1    Number of Visits 12    Date for PT Re-Evaluation 08/25/22    Authorization Type Medicaid [place]    Authorization Time Period 3 visits requested - check auth    PT Start Time [phone]    PT Stop Time 0930    PT Time Calculation (min) 35 min    Activity Tolerance Patient tolerated treatment well    Behavior During Therapy WFL for tasks assessed/performed             Past Medical History:  Diagnosis Date   Chronic left flank pain    Chronic pain    Congenital obstruction of ureteropelvic junction (UPJ)    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 [name] at [facility]   Morbid obesity [facility])    Preterm labor    Round ligament pain 09/10/2012   Torn ACL    Past Surgical History:  Procedure Laterality Date   addenoidectomy     CESAREAN SECTION N/A 02/22/2013   Procedure: CESAREAN SECTION/TWINS;  Surgeon: [name], MD;  Location: [facility];  Service: Obstetrics;  Laterality: N/A;   CHOLECYSTECTOMY     GALLBLADDER SURGERY  2019   NASAL SINUS SURGERY     NEPHRECTOMY     right side   OTHER SURGICAL HISTORY     ulner nerve removal   TONSILLECTOMY     Patient Active Problem List   Diagnosis Date Noted   Sepsis due to undetermined organism (HCC) 02/14/2022   Chronic abdominal wound infection 02/14/2022   Influenza B 02/14/2022   Acute respiratory failure with hypoxia (HCC) 02/14/2022   Hypothermia 02/14/2022   High anion gap metabolic acidosis 02/14/2022   Lactic acidosis 02/14/2022   Nausea & vomiting 02/14/2022   Diarrhea 02/14/2022    Hypokalemia 02/14/2022   Hyponatremia 02/14/2022   AKI (acute kidney injury) (HCC) 02/14/2022   Prolonged QT interval 02/14/2022   Hypoglycemia 02/14/2022   Metabolic acidosis 02/14/2022   Acute metabolic encephalopathy 02/14/2022   Multifocal pneumonia 02/13/2022   Migraine 12/12/2021   Panniculitis 12/12/2021   Bilateral pneumonia 12/12/2021   GAD (generalized anxiety disorder) 01/24/2019   GERD without esophagitis 07/13/2018   Intractable migraine without aura and without status migrainosus 07/13/2018   High risk medication use 07/13/2018   Calculus of gallbladder with acute and chronic cholecystitis with obstruction 06/19/2017   Ulnar neuropathy of left upper extremity 04/05/2017   Primary osteoarthritis of left knee 08/10/2015   Mood disorder (HCC) 12/30/2014   Smoker 05/13/2014   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   Single kidney 05/12/2014   Elevated liver function tests 09/24/2012   S/P left knee arthroscopy 07/23/2012   History of recurrent UTI (urinary tract infection) 07/23/2012   Obesity, Class III, BMI 40-49.9 (morbid obesity) (HCC) 07/23/2012    PCP: No PCP  REFERRING PROVIDER: Kirt Boys, PA-C  REFERRING DIAG: [phone] (ICD-10-CM) - Other symptoms and signs involving the musculoskeletal system M54.50 (ICD-10-CM) - Low back pain  Rationale for Evaluation  and Treatment: Rehabilitation  THERAPY DIAG:  Other low back pain  Muscle weakness (generalized)  Other symptoms and signs involving the musculoskeletal system  Other abnormalities of gait and mobility  Pain in both knees, unspecified chronicity  ONSET DATE: January 2024  SUBJECTIVE:                                                                                                                                                                                           SUBJECTIVE STATEMENT: Patient states she was in a coma in January 2024 due to sepsis. Once she woke up hips are numb and  low back are numb. Tops of feet are really sensitive to the touch. She feels that legs are getting weaker. Has some balance issues. Trouble with stairs. Low back pain and sciatica symptoms down R leg. Both knees need replacing but she is too young.   PERTINENT HISTORY:  HTN, chronic pain  PAIN:  Are you having pain? Yes: NPRS scale: 3-4/10 Pain location: waist down Pain description: sharp - feet, aching - hips, knees, back Aggravating factors: moving, standing in one position, laying on R hip Relieving factors: meds, heat, ice  PRECAUTIONS: None  WEIGHT BEARING RESTRICTIONS: No  FALLS:  Has patient fallen in last 6 months? Yes. Number of falls 2   OCCUPATION: not employed  PLOF: Independent  PATIENT GOALS: improve strength    OBJECTIVE:  Observation: bilateral LE valgus in standing  PATIENT SURVEYS: Modified Oswestry 33/50   SCREENING FOR RED FLAGS: Bowel or bladder incontinence: No Spinal tumors: No Cauda equina syndrome: No Compression fracture: No Abdominal aneurysm: No  COGNITION: Overall cognitive status: Within functional limits for tasks assessed     SENSATION: Light touch: decreased L L3, hypersensitive R L4 and L5  POSTURE: rounded shoulders, forward head, decreased lumbar lordosis, and increased thoracic kyphosis  PALPATION: Grossly TTP lumbar spine, states numb in bilateral hips  LUMBAR ROM:   AROM eval  Flexion 25% limited  Extension 25% limited   Right lateral flexion 25% limited *  Left lateral flexion 25% limited *  Right rotation 0% limited  Left rotation 0% limited   (Blank rows = not tested) *=pain  LOWER EXTREMITY ROM:   WFL for task assessed  Active  Right eval Left eval  Hip flexion    Hip extension    Hip abduction    Hip adduction    Hip internal rotation    Hip external rotation    Knee flexion    Knee extension    Ankle dorsiflexion    Ankle plantarflexion    Ankle inversion    Ankle  eversion     (Blank rows = not  tested)  LOWER EXTREMITY MMT:    MMT Right eval Left eval  Hip flexion 4+ 4+  Hip extension 4 4  Hip abduction 4 4+  Hip adduction    Hip internal rotation    Hip external rotation    Knee flexion 5 5  Knee extension 4+ 4+  Ankle dorsiflexion    Ankle plantarflexion    Ankle inversion    Ankle eversion     (Blank rows = not tested)    FUNCTIONAL TESTS:  5 times sit to stand: 18.30 seconds without UE support, c/o bilateral knee pain 2 minute walk test: 355 feet Stairs: 7 inch, increased difficulty ascending mostly using LLE step too pattern but able to complete with RLE also, decreased strength and motor control decending mildly, requires UE support  GAIT: Distance walked: 355 feet Assistive device utilized: None Level of assistance: Complete Independence Comments: , labored, trunk slightly flexed, valgus bilaterally  TODAY'S TREATMENT:                                                                                                                              DATE:  07/14/22 Bridge 2 x 10  LAQ 10 x 5 second holds bilateral    PATIENT EDUCATION:  Education details: Patient educated on exam findings, POC, scope of PT, HEP. Person educated: Patient Education method: Explanation, Demonstration, and Handouts Education comprehension: verbalized understanding, returned demonstration, verbal cues required, and tactile cues required  HOME EXERCISE PROGRAM: Access Code: [email or web address] URL: [email or web address]   Date: 07/14/2022 - Supine Bridge  - 2-3 x daily - 7 x weekly - 2 sets - 10 reps - Seated Long Arc Quad  - 2-3 x daily - 7 x weekly - 1-2 sets - 10 reps - 5 second hold  ASSESSMENT:  CLINICAL IMPRESSION: Patient a 37 y.o. y.o. female who was seen today for physical therapy evaluation and treatment for LBP. Patient presents with pain limited deficits in lumbar spine strength, ROM, endurance, activity tolerance, gait, balance, and functional mobility  with ADL. Patient is having to modify and restrict ADL as indicated by outcome measure score as well as subjective information and objective measures which is affecting overall participation. Patient will benefit from skilled physical therapy in order to improve function and reduce impairment.  OBJECTIVE IMPAIRMENTS: Abnormal gait, decreased activity tolerance, decreased endurance, decreased mobility, difficulty walking, decreased ROM, decreased strength, increased muscle spasms, impaired flexibility, improper body mechanics, postural dysfunction, obesity, and pain.   ACTIVITY LIMITATIONS: carrying, lifting, bending, standing, squatting, stairs, transfers, locomotion level, and caring for others  PARTICIPATION LIMITATIONS: meal prep, cleaning, laundry, shopping, community activity, occupation, and yard work  PERSONAL FACTORS: 1-2 comorbidities: HTN, increased BMI, chronic pain  are also affecting patient's functional outcome.   REHAB POTENTIAL: Good  CLINICAL DECISION MAKING: Stable/uncomplicated  EVALUATION COMPLEXITY: Low   GOALS: Goals reviewed with patient? Yes  SHORT TERM GOALS:  Target date: 08/04/2022    Patient will be independent with HEP in order to improve functional outcomes. Baseline:  Goal status: INITIAL  2.  Patient will report at least 25% improvement in symptoms for improved quality of life. Baseline:  Goal status: INITIAL   LONG TERM GOALS: Target date: 08/25/2022   Patient will report at least 75% improvement in symptoms for improved quality of life. Baseline:  Goal status: INITIAL  2.  Patient will improve Oswestry score by at least 8 points in order to indicate improved tolerance to activity. Baseline: 33/55 Goal status: INITIAL  3.  Patient will demonstrate at least 25% improvement in lumbar ROM in all restricted planes for improved ability to move trunk while completing chores. Baseline: see above Goal status: INITIAL  4.  Patient will be able to  ambulate at least 425 feet in in order to demonstrate improved tolerance to activity. Baseline: 355 Goal status: INITIAL  5.  Patient will demonstrate grade of 5/5 MMT grade in all tested musculature as evidence of improved strength to assist with stair ambulation and gait.   Baseline: see above Goal status: INITIAL  6.  Patient will be able to complete 5x STS in under 11.4 seconds in order to demonstrate improved functional strength. Baseline: 18.30 seconds without UE support, c/o bilateral knee pain Goal status: INITIAL   PLAN:  PT FREQUENCY: 2x/week  PT DURATION: 6 weeks  PLANNED INTERVENTIONS: Therapeutic exercises, Therapeutic activity, Neuromuscular re-education, Balance training, Gait training, Patient/Family education, Joint manipulation, Joint mobilization, Stair training, Orthotic/Fit training, DME instructions, Aquatic Therapy, Dry Needling, Electrical stimulation, Spinal manipulation, Spinal mobilization, Cryotherapy, Moist heat, Compression bandaging, scar mobilization, Splintting, Taping, Traction, Ultrasound, Ionotophoresis 4mg /ml Dexamethasone, and Manual therapy  PLAN FOR NEXT SESSION: hip and core strength, quad strength, postural strength, gait and balance training, functional strength and progress all as able   Wyman Songster, PT 07/14/2022, 9:27 AM

## 2022-08-22 ENCOUNTER — Other Ambulatory Visit (HOSPITAL_COMMUNITY): Payer: Self-pay

## 2022-09-25 ENCOUNTER — Encounter (HOSPITAL_COMMUNITY): Payer: Self-pay

## 2022-09-25 ENCOUNTER — Emergency Department (HOSPITAL_COMMUNITY): Payer: MEDICAID

## 2022-09-25 ENCOUNTER — Other Ambulatory Visit: Payer: Self-pay

## 2022-09-25 ENCOUNTER — Emergency Department (HOSPITAL_COMMUNITY)
Admission: EM | Admit: 2022-09-25 | Discharge: 2022-09-25 | Disposition: A | Discharge status: Home / Self Care | Payer: MEDICAID | Attending: Emergency Medicine | Admitting: Emergency Medicine

## 2022-09-25 DIAGNOSIS — R1013 Epigastric pain: Secondary | ICD-10-CM

## 2022-09-25 DIAGNOSIS — N3000 Acute cystitis without hematuria: Secondary | ICD-10-CM | POA: Diagnosis not present

## 2022-09-25 LAB — COMPREHENSIVE METABOLIC PANEL
ALT: 15 U/L (ref 0–44)
AST: 16 U/L (ref 15–41)
Albumin: 3.8 g/dL (ref 3.5–5.0)
Alkaline Phosphatase: 66 U/L (ref 38–126)
Anion gap: 10 (ref 5–15)
BUN: 19 mg/dL (ref 6–20)
CO2: 17 mmol/L — ABNORMAL LOW (ref 22–32)
Calcium: 8.8 mg/dL — ABNORMAL LOW (ref 8.9–10.3)
Chloride: 112 mmol/L — ABNORMAL HIGH (ref 98–111)
Creatinine, Ser: 1.17 mg/dL — ABNORMAL HIGH (ref 0.44–1.00)
GFR, Estimated: >60 mL/min (ref >60)
Glucose, Bld: 91 mg/dL (ref 70–99)
Potassium: 3.7 mmol/L (ref 3.5–5.1)
Sodium: 139 mmol/L (ref 135–145)
Total Bilirubin: 0.3 mg/dL (ref 0.3–1.2)
Total Protein: 7.3 g/dL (ref 6.5–8.1)

## 2022-09-25 LAB — URINALYSIS, ROUTINE W REFLEX MICROSCOPIC
Bilirubin Urine: NEGATIVE
Glucose, UA: NEGATIVE mg/dL
Hgb urine dipstick: NEGATIVE
Ketones, ur: NEGATIVE mg/dL
Nitrite: NEGATIVE
Protein, ur: 30 mg/dL — AB
Specific Gravity, Urine: 1.024 (ref 1.005–1.030)
WBC, UA: >50 WBC/hpf (ref 0–5)
pH: 5 (ref 5.0–8.0)

## 2022-09-25 LAB — CBC
HCT: 42 % (ref 36.0–46.0)
Hemoglobin: 13.4 g/dL (ref 12.0–15.0)
MCH: 28.2 pg (ref 26.0–34.0)
MCHC: 31.9 g/dL (ref 30.0–36.0)
MCV: 88.4 fL (ref 80.0–100.0)
Platelets: 379 10*3/uL (ref 150–400)
RBC: 4.75 MIL/uL (ref 3.87–5.11)
RDW: 16.6 % — ABNORMAL HIGH (ref 11.5–15.5)
WBC: 11 10*3/uL — ABNORMAL HIGH (ref 4.0–10.5)
nRBC: 0 % (ref 0.0–0.2)

## 2022-09-25 LAB — POC URINE PREG, ED: Preg Test, Ur: NEGATIVE

## 2022-09-25 LAB — PREGNANCY, URINE: Preg Test, Ur: NEGATIVE

## 2022-09-25 LAB — LIPASE, BLOOD: Lipase: 27 U/L (ref 11–51)

## 2022-09-25 MED ORDER — SODIUM CHLORIDE 0.9 % IV SOLN
1.0000 g | Freq: Once | INTRAVENOUS | Status: AC
  Administered 2022-09-25: 1 g via INTRAVENOUS
  Filled 2022-09-25: qty 10

## 2022-09-25 MED ORDER — ONDANSETRON HCL 4 MG/2ML IJ SOLN
4.0000 mg | Freq: Once | INTRAMUSCULAR | Status: AC
  Administered 2022-09-25: 4 mg via INTRAVENOUS
  Filled 2022-09-25: qty 2

## 2022-09-25 MED ORDER — SODIUM CHLORIDE 0.9 % IV BOLUS
1000.0000 mL | Freq: Once | INTRAVENOUS | Status: AC
  Administered 2022-09-25: 1000 mL via INTRAVENOUS

## 2022-09-25 MED ORDER — MELOXICAM 15 MG PO TABS: 15.0000 mg | ORAL_TABLET | Freq: Every day | ORAL | 0 refills | Status: AC

## 2022-09-25 MED ORDER — IOHEXOL 300 MG/ML  SOLN
100.0000 mL | Freq: Once | INTRAMUSCULAR | Status: AC | PRN
  Administered 2022-09-25: 100 mL via INTRAVENOUS

## 2022-09-25 MED ORDER — ONDANSETRON 4 MG PO TBDP: 4.0000 mg | ORAL_TABLET | Freq: Three times a day (TID) | ORAL | 0 refills | Status: AC | PRN

## 2022-09-25 MED ORDER — HYDROMORPHONE HCL 1 MG/ML IJ SOLN
1.0000 mg | Freq: Once | INTRAMUSCULAR | Status: AC
  Administered 2022-09-25: 1 mg via INTRAVENOUS
  Filled 2022-09-25: qty 1

## 2022-09-25 MED ORDER — CEPHALEXIN 500 MG PO CAPS: 500.0000 mg | ORAL_CAPSULE | Freq: Three times a day (TID) | ORAL | 0 refills | Status: AC

## 2022-09-25 NOTE — Discharge Instructions (Signed)
Thankfully your testing does not show any signs of severe surgical problems but it does show that you likely do have a urinary infection.  I would like for you to take the following medications  Cephalexin 3 times a day for 7 days to treat the urinary infection  Mobic once a day for 7 days to help with the pain  Zofran is a medication which can help with nausea.  You may take 4 mg by mouth every 6 hours as needed if you are an adult, if your child under the age of 6 take half of a tablet or 2 mg every 6 hours as needed.  This should dissolve on your tongue within a short timeframe.  Wait about 30 minutes after taking it to help with drinking clear liquids.  Thank you for allowing [place] to treat you in the emergency department today.  After reviewing your examination and potential testing that was done it appears that you are safe to go home.  I would like for you to follow-up with your doctor within the next several days, have them obtain your results and follow-up with them to review all of these tests.  If you should develop severe or worsening symptoms return to the emergency department immediately

## 2022-09-25 NOTE — ED Provider Notes (Signed)
[place] EMERGENCY DEPARTMENT AT [facility] Provider Note   CSN: [id number] Arrival date & time: 09/25/22  1826     History  Chief Complaint  Patient presents with   Abdominal Pain    Alice [name] is a 37 y.o. female.   Abdominal Pain  Patient is a 37 year old female history of a prior cholecystectomy, she is lost approximately 110 pounds on different types of weight loss regimens, she started injectable Wegovy approximately 1 month ago.  She reports that over the last 3 days she has had increasing episodes of epigastric pain which is sharp in nature seems to be mid right upper quadrant, does not radiate, it is associated with multiple episodes of nausea vomiting and diarrhea which is watery oily foul-smelling stool.  She denies seeing any blood, denies any other prior abdominal surgery other than the cholecystectomy 19 years ago.  She does report being diagnosed with pancreatitis within the last year but cannot tell me why that occurred, she does not drink alcohol, she does take medication including metoprolol, she also takes a statin, she is also on Zyprexa, gabapentin and occasional albuterol.    Home Medications Prior to Admission medications   Medication Sig Start Date End Date Taking? Authorizing Provider  cephALEXin (KEFLEX) 500 MG capsule Take 1 capsule (500 mg total) by mouth 3 (three) times daily for 7 days. 09/25/22 10/02/22 Yes Eber Hong, MD  meloxicam (MOBIC) 15 MG tablet Take 1 tablet (15 mg total) by mouth daily for 7 days. 09/25/22 10/02/22 Yes Eber Hong, MD  ondansetron (ZOFRAN-ODT) 4 MG disintegrating tablet Take 1 tablet (4 mg total) by mouth every 8 (eight) hours as needed for nausea. 09/25/22  Yes Eber Hong, MD  albuterol (VENTOLIN HFA) 108 (90 Base) MCG/ACT inhaler Inhale 2 puffs into the lungs every 6 (six) hours as needed for wheezing or shortness of breath. 04/11/22   Omar Person, MD  Budeson-Glycopyrrol-Formoterol (BREZTRI  AEROSPHERE) 160-9-4.8 MCG/ACT AERO Inhale 2 puffs into the lungs in the morning and at bedtime. 04/11/22   Omar Person, MD  cholecalciferol (VITAMIN D3) 25 MCG (1000 UNIT) tablet Take by mouth. Patient not taking: Reported on 06/17/2022 12/20/21   [provider]  Ferrous Sulfate (IRON SUPPLEMENT PO) Take 1 tablet by mouth in the morning and at bedtime.    [provider]  gabapentin (NEURONTIN) 100 MG capsule Take 1 capsule (100 mg total) by mouth 2 (two) times daily. 03/03/22   Rhetta Mura, MD  metoprolol tartrate (LOPRESSOR) 25 MG tablet Take 1 tablet (25 mg total) by mouth 2 (two) times daily. Patient not taking: Reported on 06/17/2022 03/03/22   Rhetta Mura, MD  montelukast (SINGULAIR) 10 MG tablet Take by mouth. 07/30/20   [provider]  naloxone [facility]) nasal spray 4 mg/0.1 mL  10/12/21   [provider]  nitrofurantoin (MACRODANTIN) 50 MG capsule Take 1 capsule (50 mg total) by mouth at bedtime. 06/17/22   McKenzie, Mardene Celeste, MD  nystatin (MYCOSTATIN/NYSTOP) powder Apply topically 3 times daily. Patient not taking: Reported on 06/17/2022 03/03/22   Rhetta Mura, MD  OLANZapine (ZYPREXA) 10 MG tablet Take 1 tablet (10 mg total) by mouth at bedtime. 01/24/19   Sonny Masters, FNP  omeprazole (PRILOSEC) 40 MG capsule Take 1 capsule (40 mg total) by mouth daily. 07/05/19   Deliah Boston F, FNP  oxyCODONE (OXY IR/ROXICODONE) 5 MG immediate release tablet Take 1 tablet (5 mg total) by mouth every 6 (six) hours as  needed for moderate pain or severe pain. 03/03/22   Rhetta Mura, MD  pravastatin (PRAVACHOL) 80 MG tablet Take by mouth. 08/06/19   [provider]  SUMAtriptan (IMITREX) 25 MG tablet Take 25 mg by mouth as needed.    [provider]      Allergies    Cinnamon, Other, Reglan [metoclopramide], Cinnamon flavor, Codeine, Flomax [tamsulosin], Ibuprofen, Levofloxacin, and Tylenol [acetaminophen]    Review  of Systems   Review of Systems  Gastrointestinal:  Positive for abdominal pain.  All other systems reviewed and are negative.   Physical Exam Updated Vital Signs BP 111/79   Pulse 93   Temp (!) 97 F (36.1 C) (Oral)   Resp 18   Ht 1.753 m (5\' 9" )   Wt 136.1 kg   SpO2 99%   BMI 44.30 kg/m  Physical Exam Vitals and nursing note reviewed.  Constitutional:      General: She is not in acute distress.    Appearance: She is well-developed.  HENT:     Head: Normocephalic and atraumatic.     Nose: No congestion.     Mouth/Throat:     Pharynx: No oropharyngeal exudate.  Eyes:     General: No scleral icterus.       Right eye: No discharge.        Left eye: No discharge.     Conjunctiva/sclera: Conjunctivae normal.     Pupils: Pupils are equal, round, and reactive to light.  Neck:     Thyroid: No thyromegaly.     Vascular: No JVD.  Cardiovascular:     Rate and Rhythm: Regular rhythm. Tachycardia present.     Heart sounds: Normal heart sounds. No murmur heard.    No friction rub. No gallop.  Pulmonary:     Effort: Pulmonary effort is normal. No respiratory distress.     Breath sounds: Normal breath sounds. No wheezing or rales.  Abdominal:     General: Bowel sounds are normal. There is no distension.     Palpations: Abdomen is soft. There is no mass.     Tenderness: There is abdominal tenderness.     Comments: Mild mid abdominal and epigastric tenderness, no guarding, left CVA tenderness present  Musculoskeletal:        General: No tenderness. Normal range of motion.     Cervical back: Normal range of motion and neck supple.     Right lower leg: No edema.     Left lower leg: No edema.  Lymphadenopathy:     Cervical: No cervical adenopathy.  Skin:    General: Skin is warm and dry.     Findings: No erythema or rash.  Neurological:     General: No focal deficit present.     Mental Status: She is alert.     Coordination: Coordination normal.  Psychiatric:         Behavior: Behavior normal.     ED Results / Procedures / Treatments   Labs (all labs ordered are listed, but only abnormal results are displayed) Labs Reviewed  COMPREHENSIVE METABOLIC PANEL - Abnormal; Notable for the following components:      Result Value   Chloride 112 (*)    CO2 17 (*)    Creatinine, Ser 1.17 (*)    Calcium 8.8 (*)    All other components within normal limits  CBC - Abnormal; Notable for the following components:   WBC 11.0 (*)    RDW 16.6 (*)  All other components within normal limits  URINALYSIS, ROUTINE W REFLEX MICROSCOPIC - Abnormal; Notable for the following components:   APPearance HAZY (*)    Protein, ur 30 (*)    Leukocytes,Ua MODERATE (*)    Bacteria, UA RARE (*)    All other components within normal limits  URINE CULTURE  LIPASE, BLOOD  PREGNANCY, URINE  POC URINE PREG, ED    EKG None  Radiology CT ABDOMEN PELVIS W CONTRAST  Result Date: 09/25/2022 CLINICAL DATA:  Acute abdominal pain for several days, initial encounter EXAM: CT ABDOMEN AND PELVIS WITH CONTRAST TECHNIQUE: Multidetector CT imaging of the abdomen and pelvis was performed using the standard protocol following bolus administration of intravenous contrast. RADIATION DOSE REDUCTION: This exam was performed according to the departmental dose-optimization program which includes automated exposure control, adjustment of the mA and/or kV according to patient size and/or use of iterative reconstruction technique. CONTRAST:  OMNIPAQUE IOHEXOL 300 MG/ML  SOLN COMPARISON:  None Available. FINDINGS: Lower chest: No acute abnormality. Hepatobiliary: No focal liver abnormality is seen. Status post cholecystectomy. No biliary dilatation. Pancreas: Unremarkable. No pancreatic ductal dilatation or surrounding inflammatory changes. Spleen: Normal in size without focal abnormality. Adrenals/Urinary Tract: Adrenal glands are within normal limits. The left kidney is well visualized within normal  enhancement pattern. 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 No obstructive changes are seen. The bladder is partially distended. Stomach/Bowel: Appendix is within normal limits. No obstructive or inflammatory changes of the colon are seen. Stomach and small bowel appear within normal limits. Vascular/Lymphatic: No significant vascular findings are present. No enlarged abdominal or pelvic lymph nodes. Reproductive: Uterus and bilateral adnexa are unremarkable. IUD is noted in place. Other: No abdominal wall hernia or abnormality. No abdominopelvic ascites. Musculoskeletal: No acute abnormality noted. IMPRESSION: Status post right nephrectomy. No acute abnormality noted. Electronically Signed   By: Alcide Clever M.D.   On: 09/25/2022 21:42    Procedures Procedures    Medications Ordered in ED Medications  cefTRIAXone (ROCEPHIN) 1 g in sodium chloride 0.9 % 100 mL IVPB (has no administration in time range)  sodium chloride 0.9 % bolus 1,000 mL (1,000 mLs Intravenous New Bag/Given 09/25/22 2011)  ondansetron (ZOFRAN) injection 4 mg (4 mg Intravenous Given 09/25/22 2009)  HYDROmorphone (DILAUDID) injection 1 mg (1 mg Intravenous Given 09/25/22 2012)  iohexol (OMNIPAQUE) 300 MG/ML solution 100 mL (100 mLs Intravenous Contrast Given 09/25/22 2130)    ED Course/ Medical Decision Making/ A&P                                 Medical Decision Making Amount and/or Complexity of Data Reviewed Labs: ordered. Radiology: ordered.  Risk Prescription drug management.    This patient presents to the ED for concern of abdominal epigastric pain, this involves an extensive number of treatment options, and is a complaint that carries with it a high risk of complications and morbidity.  The differential diagnosis includes pancreatitis, bowel obstruction, intestinal colic, pyelonephritis   Co morbidities that complicate the patient evaluation  Significant weight loss, now on injectable Wegovy which could cause  side effects such as gastroparesis renal failure or other complications   Additional history obtained:  Additional history obtained from medical record External records from outside source obtained and reviewed including prior evaluation, including admission in April 2024 for idiopathic acute pancreatitis as well as acute cystitis and a metabolic acidosis with elevated liver function tests  Lab Tests:  I Ordered, and personally interpreted labs.  The pertinent results include: Urinalysis with greater than 50 white blood cells with the presence of bacteria, pregnancy negative, lipase negative, metabolic panel without significant findings, CBC with no significant leukocytosis   Imaging Studies ordered:  I ordered imaging studies including CT scan of the abdomen and pelvis I independently visualized and interpreted imaging which showed no signs of acute intra-abdominal abnormalities.  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  I personally viewed and interpreted the cardiac monitored which showed an underlying rhythm of: Normal sinus rhythm, no tachycardia    Problem List / ED Course / Critical interventions / Medication management  The patient does not have any surgical findings that would explain her epigastric discomfort, she states it comes in waves, I suspect this is more intestinal.  She did have some left flank tenderness but no signs of pyelonephritis on the CT, she will be treated for UTI based on her urinalysis results with cephalexin I ordered medication including Rocephin, hydromorphone for infection and pain Reevaluation of the patient after these medicines showed that the patient improved I have reviewed the patients home medicines and have made adjustments as needed   Social Determinants of Health:  Absence of kidney  and gallbladder, no other social determinants of health   Test / Admission - Considered:  Considered admission but the patient has reassuring vital signs, she is not vomiting, she is agreeable to the plan and understands indications for return  I have discussed with the patient at the bedside the results, and the meaning of these results.  They have expressed her understanding to the need for follow-up with primary care physician         Final Clinical Impression(s) / ED Diagnoses Final diagnoses:  Epigastric pain  Acute cystitis without hematuria    Rx / DC Orders ED Discharge Orders          Ordered    cephALEXin (KEFLEX) 500 MG capsule  3 times daily        09/25/22 2153    meloxicam (MOBIC) 15 MG tablet  Daily        09/25/22 2154    ondansetron (ZOFRAN-ODT) 4 MG disintegrating tablet  Every 8 hours PRN        09/25/22 2154              Eber Hong, MD 09/25/22 2156

## 2022-09-25 NOTE — ED Triage Notes (Signed)
"  N/v/d x 3 days, severe upper abdominal pain began around 0600" per pt

## 2022-10-05 ENCOUNTER — Ambulatory Visit: Payer: Medicaid Other | Admitting: Urology

## 2022-10-05 DIAGNOSIS — N390 Urinary tract infection, site not specified: Secondary | ICD-10-CM

## 2022-11-23 ENCOUNTER — Ambulatory Visit: Payer: MEDICAID | Admitting: Urology

## 2023-01-02 ENCOUNTER — Other Ambulatory Visit: Payer: Self-pay

## 2023-01-02 ENCOUNTER — Emergency Department (HOSPITAL_COMMUNITY)
Admission: EM | Admit: 2023-01-02 | Discharge: 2023-01-02 | Discharge status: Against Medical Advice | Payer: MEDICAID | Attending: Emergency Medicine | Admitting: Emergency Medicine

## 2023-01-02 DIAGNOSIS — R112 Nausea with vomiting, unspecified: Secondary | ICD-10-CM | POA: Diagnosis not present

## 2023-01-02 DIAGNOSIS — Z5321 Procedure and treatment not carried out due to patient leaving prior to being seen by health care provider: Secondary | ICD-10-CM | POA: Insufficient documentation

## 2023-01-02 DIAGNOSIS — R109 Unspecified abdominal pain: Secondary | ICD-10-CM | POA: Insufficient documentation

## 2023-01-02 LAB — BASIC METABOLIC PANEL
Anion gap: 9 (ref 5–15)
BUN: 16 mg/dL (ref 6–20)
CO2: 23 mmol/L (ref 22–32)
Calcium: 8.7 mg/dL — ABNORMAL LOW (ref 8.9–10.3)
Chloride: 106 mmol/L (ref 98–111)
Creatinine, Ser: 0.97 mg/dL (ref 0.44–1.00)
GFR, Estimated: >60 mL/min (ref >60)
Glucose, Bld: 98 mg/dL (ref 70–99)
Potassium: 4.3 mmol/L (ref 3.5–5.1)
Sodium: 138 mmol/L (ref 135–145)

## 2023-01-02 LAB — URINALYSIS, ROUTINE W REFLEX MICROSCOPIC
Bilirubin Urine: NEGATIVE
Glucose, UA: NEGATIVE mg/dL
Hgb urine dipstick: NEGATIVE
Ketones, ur: NEGATIVE mg/dL
Nitrite: NEGATIVE
Protein, ur: 100 mg/dL — AB
Specific Gravity, Urine: 1.029 (ref 1.005–1.030)
pH: 5 (ref 5.0–8.0)

## 2023-01-02 LAB — CBC
HCT: 40.9 % (ref 36.0–46.0)
Hemoglobin: 12.5 g/dL (ref 12.0–15.0)
MCH: 28.3 pg (ref 26.0–34.0)
MCHC: 30.6 g/dL (ref 30.0–36.0)
MCV: 92.5 fL (ref 80.0–100.0)
Platelets: 316 10*3/uL (ref 150–400)
RBC: 4.42 MIL/uL (ref 3.87–5.11)
RDW: 15.5 % (ref 11.5–15.5)
WBC: 10.3 10*3/uL (ref 4.0–10.5)
nRBC: 0 % (ref 0.0–0.2)

## 2023-01-02 LAB — HCG, QUANTITATIVE, PREGNANCY: hCG, Beta Chain, Quant, S: <1 m[IU]/mL (ref <5)

## 2023-01-02 NOTE — ED Triage Notes (Signed)
Pt c/o left side flank pain starting this morning when waking up. Pt states hx of same pain with kidney stones. Pt states urine brown and cloudy. Pt states multiple episodes of nausea and vomiting this morning.

## 2023-01-02 NOTE — ED Notes (Signed)
Patient inquired about how long the wait time would be. She stated she had been here for over 5 hours. Stated she was leaving and going to another hospital. This nurse apologized for the wait time. Patient stated she was still leaving.

## 2023-01-03 ENCOUNTER — Telehealth: Payer: Self-pay | Admitting: Urology

## 2023-01-03 NOTE — Telephone Encounter (Signed)
Patient called thinks she has a kidney infecition she went to ED last night they did urine and blood work, the wait was too long she did not stay for treatment. She would like a antibiotic called in to the [facility] in [place]

## 2023-01-03 NOTE — Telephone Encounter (Signed)
Please call and offer pt same day appt with Sarah today at 1pm.  She needs to be evaluated- they did not do culture.

## 2023-01-23 ENCOUNTER — Encounter: Payer: Self-pay | Admitting: Urology

## 2023-01-23 ENCOUNTER — Ambulatory Visit (INDEPENDENT_AMBULATORY_CARE_PROVIDER_SITE_OTHER): Payer: MEDICAID | Admitting: Urology

## 2023-01-23 VITALS — BP 133/88 | HR 105

## 2023-01-23 DIAGNOSIS — N390 Urinary tract infection, site not specified: Secondary | ICD-10-CM

## 2023-01-23 DIAGNOSIS — N200 Calculus of kidney: Secondary | ICD-10-CM | POA: Diagnosis not present

## 2023-01-23 DIAGNOSIS — Z87440 Personal history of urinary (tract) infections: Secondary | ICD-10-CM

## 2023-01-23 LAB — URINALYSIS, ROUTINE W REFLEX MICROSCOPIC
Bilirubin, UA: NEGATIVE
Glucose, UA: NEGATIVE
Ketones, UA: NEGATIVE
Nitrite, UA: NEGATIVE
Protein,UA: NEGATIVE
RBC, UA: NEGATIVE
Specific Gravity, UA: 1.02 (ref 1.005–1.030)
Urobilinogen, Ur: 0.2 mg/dL (ref 0.2–1.0)
pH, UA: 6 (ref 5.0–7.5)

## 2023-01-23 LAB — MICROSCOPIC EXAMINATION: Epithelial Cells (non renal): >10 /[HPF] — AB (ref 0–10)

## 2023-01-23 MED ORDER — TRIMETHOPRIM 100 MG PO TABS: 100.0000 mg | ORAL_TABLET | Freq: Every evening | ORAL | 11 refills | Status: AC

## 2023-01-23 NOTE — Patient Instructions (Signed)
Urinary Tract Infection, Adult  A urinary tract infection (UTI) is an infection of any part of the urinary tract. The urinary tract includes the kidneys, ureters, bladder, and urethra. These organs make, store, and get rid of urine in the body. An upper UTI affects the ureters and kidneys. A lower UTI affects the bladder and urethra. What are the causes? Most urinary tract infections are caused by bacteria in your genital area around your urethra, where urine leaves your body. These bacteria grow and cause inflammation of your urinary tract. What increases the risk? You are more likely to develop this condition if: You have a urinary catheter that stays in place. You are not able to control when you urinate or have a bowel movement (incontinence). You are female and you: Use a spermicide or diaphragm for birth control. Have low estrogen levels. Are pregnant. You have certain genes that increase your risk. You are sexually active. You take antibiotic medicines. You have a condition that causes your flow of urine to slow down, such as: An enlarged prostate, if you are female. Blockage in your urethra. A kidney stone. A nerve condition that affects your bladder control (neurogenic bladder). Not getting enough to drink, or not urinating often. You have certain medical conditions, such as: Diabetes. A weak disease-fighting system (immunesystem). Sickle cell disease. Gout. Spinal cord injury. What are the signs or symptoms? Symptoms of this condition include: Needing to urinate right away (urgency). Frequent urination. This may include small amounts of urine each time you urinate. Pain or burning with urination. Blood in the urine. Urine that smells bad or unusual. Trouble urinating. Cloudy urine. Vaginal discharge, if you are female. Pain in the abdomen or the lower back. You may also have: Vomiting or a decreased appetite. Confusion. Irritability or tiredness. A fever or  chills. Diarrhea. The first symptom in older adults may be confusion. In some cases, they may not have any symptoms until the infection has worsened. How is this diagnosed? This condition is diagnosed based on your medical history and a physical exam. You may also have other tests, including: Urine tests. Blood tests. Tests for STIs (sexually transmitted infections). If you have had more than one UTI, a cystoscopy or imaging studies may be done to determine the cause of the infections. How is this treated? Treatment for this condition includes: Antibiotic medicine. Over-the-counter medicines to treat discomfort. Drinking enough water to stay hydrated. If you have frequent infections or have other conditions such as a kidney stone, you may need to see a health care provider who specializes in the urinary tract (urologist). In rare cases, urinary tract infections can cause sepsis. Sepsis is a life-threatening condition that occurs when the body responds to an infection. Sepsis is treated in the hospital with IV antibiotics, fluids, and other medicines. Follow these instructions at home:  Medicines Take over-the-counter and prescription medicines only as told by your health care provider. If you were prescribed an antibiotic medicine, take it as told by your health care provider. Do not stop using the antibiotic even if you start to feel better. General instructions Make sure you: Empty your bladder often and completely. Do not hold urine for long periods of time. Empty your bladder after sex. Wipe from front to back after urinating or having a bowel movement if you are female. Use each tissue only one time when you wipe. Drink enough fluid to keep your urine pale yellow. Keep all follow-up visits. This is important. Contact a health   care provider if: Your symptoms do not get better after 1-2 days. Your symptoms go away and then return. Get help right away if: You have severe pain in  your back or your lower abdomen. You have a fever or chills. You have nausea or vomiting. Summary A urinary tract infection (UTI) is an infection of any part of the urinary tract, which includes the kidneys, ureters, bladder, and urethra. Most urinary tract infections are caused by bacteria in your genital area. Treatment for this condition often includes antibiotic medicines. If you were prescribed an antibiotic medicine, take it as told by your health care provider. Do not stop using the antibiotic even if you start to feel better. Keep all follow-up visits. This is important. This information is not intended to replace advice given to you by your health care provider. Make sure you discuss any questions you have with your health care provider. Document Revised: 09/08/2019 Document Reviewed: 09/13/2019 [employer]vier Patient Education  2024 [employer]  

## 2023-01-23 NOTE — Progress Notes (Signed)
01/23/2023 8:57 AM   Alice [name] 13-May-1985 [id number]  Referring provider: No referring provider defined for this encounter.  Frequent UTI   HPI: Alice [name] is a 37yo here for followup for frequent UTI and nephrolithiasis. She is on macrodantin 50mg  at bedtime. She has had 3 UTIs in the past 2 months. She was seen at [facility] but cultures were not sent. She was treated with bactrim each time. CT from August shows a 4mm left lower pole calculus. She denies any flank pain.     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 [name] at [facility]   Morbid obesity [facility])    Preterm labor    Round ligament pain 09/10/2012   Torn ACL     Surgical History: Past Surgical History:  Procedure Laterality Date   addenoidectomy     CESAREAN SECTION N/A 02/22/2013   Procedure: CESAREAN SECTION/TWINS;  Surgeon: [name], MD;  Location: [facility];  Service: Obstetrics;  Laterality: N/A;   CHOLECYSTECTOMY     GALLBLADDER SURGERY  2019   NASAL SINUS SURGERY     NEPHRECTOMY     right side   OTHER SURGICAL HISTORY     ulner nerve removal   TONSILLECTOMY      Home Medications:  Allergies as of 01/23/2023       Reactions   Cinnamon Anaphylaxis   Cinnamon flavoring   Other Swelling   Artificial cinnamon causes tongue to swell   Reglan [metoclopramide] Other (See Comments)   Tactile disturbances REACTION: causes skin to "crawl"   Cinnamon Flavoring Agent (non-screening) Swelling   Artificial cinnamon causes tongue to swell   Codeine Nausea Only   Straight codeine only Percocet & vicodin are tolerated by pt.   Flomax  [tamsulosin] Other (See Comments)   Other reaction(s): Other (See Comments) Oral sores Oral sores   Ibuprofen Other (See Comments)   Stomach pain Causes severe pain in stomach Pt can tolerate if used sparingly   Levofloxacin Hives   Tylenol [acetaminophen] Other (See Comments)   Pt reports Tylenol causes her kidney to bleed.        Medication List        Accurate as of January 23, 2023  8:57 AM. If you have any questions, ask your nurse or doctor.          albuterol 108 (90 Base) MCG/ACT inhaler Commonly known as: VENTOLIN HFA Inhale 2 puffs into the lungs every 6 (six) hours as needed for wheezing or shortness of breath.   Breztri Aerosphere 160-9-4.8 MCG/ACT Aero Generic drug: Budeson-Glycopyrrol-Formoterol Inhale 2 puffs into the lungs in the morning and at bedtime.   cholecalciferol 25 MCG (1000 UNIT) tablet Commonly known as: VITAMIN D3 Take by mouth.   gabapentin 100 MG capsule Commonly known as: NEURONTIN Take 1 capsule (100 mg total) by mouth 2 (two) times daily.   IRON SUPPLEMENT PO Take 1 tablet by mouth in the morning and at bedtime.   metoprolol tartrate 25 MG tablet Commonly known as: LOPRESSOR Take 1 tablet (  25 mg total) by mouth 2 (two) times daily.   montelukast 10 MG tablet Commonly known as: SINGULAIR Take by mouth.   naloxone 4 MG/0.1ML Liqd nasal spray kit Commonly known as: NARCAN   nitrofurantoin 50 MG capsule Commonly known as: Macrodantin Take 1 capsule (50 mg total) by mouth at bedtime.   nystatin powder Commonly known as: MYCOSTATIN/NYSTOP Apply topically 3 times daily.   OLANZapine 10 MG tablet Commonly known as: ZYPREXA Take 1 tablet (10 mg total) by mouth at bedtime.   omeprazole 40 MG capsule Commonly known as: PRILOSEC Take 1 capsule (40 mg total) by mouth daily.   ondansetron 4 MG disintegrating tablet Commonly known as: ZOFRAN-ODT Take 1 tablet (4 mg total) by mouth every 8 (eight) hours as needed for nausea.    oxyCODONE 5 MG immediate release tablet Commonly known as: Oxy IR/ROXICODONE Take 1 tablet (5 mg total) by mouth every 6 (six) hours as needed for moderate pain or severe pain.   pravastatin 80 MG tablet Commonly known as: PRAVACHOL Take by mouth.   SUMAtriptan 25 MG tablet Commonly known as: IMITREX Take 25 mg by mouth as needed.        Allergies:  Allergies  Allergen Reactions   Cinnamon Anaphylaxis    Cinnamon flavoring    Other Swelling    Artificial cinnamon causes tongue to swell   Reglan [Metoclopramide] Other (See Comments)    Tactile disturbances REACTION: causes skin to "crawl"   Cinnamon Flavoring Agent (Non-Screening) Swelling    Artificial cinnamon causes tongue to swell   Codeine Nausea Only    Straight codeine only Percocet & vicodin are tolerated by pt.   Flomax [Tamsulosin] Other (See Comments)    Other reaction(s): Other (See Comments) Oral sores Oral sores   Ibuprofen Other (See Comments)    Stomach pain Causes severe pain in stomach Pt can tolerate if used sparingly   Levofloxacin Hives   Tylenol [Acetaminophen] Other (See Comments)    Pt reports Tylenol causes her kidney to bleed.    Family History: Family History  Problem Relation Age of Onset   Hypertension Mother    Hyperlipidemia Mother    Heart disease Mother    Heart disease Father        MI   Alcohol abuse Father    Alcohol abuse Brother    Diabetes Maternal Grandmother    Hyperlipidemia Maternal Grandmother     Social History:  reports that she quit smoking about 13 months ago. Her smoking use included cigarettes. She started smoking about 9 years ago. She has a 4.4 pack-year smoking history. She has never used smokeless tobacco. She reports that she does not drink alcohol and does not use drugs.  ROS: All other review of systems were reviewed and are negative except what is noted above in HPI  Physical Exam: BP 133/88   Pulse (!) 105   Constitutional:  Alert and  oriented, No acute distress. HEENT: [place] AT, moist mucus membranes.  Trachea midline, no masses. Cardiovascular: No clubbing, cyanosis, or edema. Respiratory: Normal respiratory effort, no increased work of breathing. GI: Abdomen is soft, nontender, nondistended, no abdominal masses GU: No CVA tenderness.  Lymph: No cervical or inguinal lymphadenopathy. Skin: No rashes, bruises or suspicious lesions. Neurologic: Grossly intact, no focal deficits, moving all 4 extremities. Psychiatric: Normal mood and affect.  Laboratory Data: Lab Results  Component Value Date   WBC 10.3 01/02/2023   HGB 12.5 01/02/2023   HCT 40.9 01/02/2023  MCV 92.5 01/02/2023   PLT 316 01/02/2023    Lab Results  Component Value Date   CREATININE 0.97 01/02/2023    No results found for: "PSA"  No results found for: "TESTOSTERONE"  No results found for: "HGBA1C"  Urinalysis    Component Value Date/Time   COLORURINE YELLOW 01/02/2023 1606   APPEARANCEUR HAZY (A) 01/02/2023 1606   APPEARANCEUR Clear 06/17/2022 1011   LABSPEC 1.029 01/02/2023 1606   PHURINE 5.0 01/02/2023 1606   GLUCOSEU NEGATIVE 01/02/2023 1606   HGBUR NEGATIVE 01/02/2023 1606   BILIRUBINUR NEGATIVE 01/02/2023 1606   BILIRUBINUR Negative 06/17/2022 1011   KETONESUR NEGATIVE 01/02/2023 1606   PROTEINUR 100 (A) 01/02/2023 1606   UROBILINOGEN 0.2 02/01/2014 1036   NITRITE NEGATIVE 01/02/2023 1606   LEUKOCYTESUR SMALL (A) 01/02/2023 1606    Lab Results  Component Value Date   LABMICR Comment 06/17/2022   WBCUA 11-30 (A) 04/07/2017   RBCUA 0-2 04/07/2017   LABEPIT >10 (A) 04/07/2017   BACTERIA RARE (A) 01/02/2023    Pertinent Imaging: CT 09/2022: Images reviewed and discussed with patient  Results for orders placed during the hospital encounter of 02/13/22  DG Abd 1 View  Narrative CLINICAL DATA:  OG tube placement  EXAM: ABDOMEN - 1 VIEW  COMPARISON:  02/14/2022  FINDINGS: Esophageal tube tip overlies the proximal to  mid stomach, side-port probably in the region of or just distal to GE junction. Upper gas pattern is unremarkable  IMPRESSION: Esophageal tube tip overlies the proximal to mid stomach.   Electronically Signed By: Jasmine Pang M.D. On: 02/14/2022 22:14  No results found for this or any previous visit.  No results found for this or any previous visit.  No results found for this or any previous visit.  Results for orders placed during the hospital encounter of 07/01/13  US Renal  Narrative CLINICAL DATA:  Left-sided pain.  Hematuria.  Solitary left kidney.  EXAM: RENAL/URINARY TRACT ULTRASOUND COMPLETE  COMPARISON:  Abdominal CT 10/18/2011  FINDINGS: Right Kidney:  Surgically absent  Left Kidney:  Length: 14 cm, likely compensatory hypertrophy. Left pelviectasis without definitive hydronephrosis. No visible stone. No evidence of solid mass.  Bladder:  Ureteral jet not captured. The bladder is moderately distended, consistent with left ureteral patency.  IMPRESSION: 1. Left pelviectasis. 2. Ureteral jet was not visualized, but there is moderate distention of the bladder that is consistent with left ureteral patency (status post right nephrectomy).   Electronically Signed By: Tiburcio Pea M.D. On: 07/01/2013 15:41  No valid procedures specified. No results found for this or any previous visit.  Results for orders placed during the hospital encounter of 09/19/17  CT Renal Stone Study  Narrative CLINICAL DATA:  Pelvic pain and hematuria  EXAM: CT ABDOMEN AND PELVIS WITHOUT CONTRAST  TECHNIQUE: Multidetector CT imaging of the abdomen and pelvis was performed following the standard protocol without IV contrast.  COMPARISON:  None.  FINDINGS: Lower chest: No acute abnormality.  Hepatobiliary: No focal liver abnormality is seen. Status post cholecystectomy. No biliary dilatation.  Pancreas: Unremarkable. No pancreatic ductal dilatation  or surrounding inflammatory changes.  Spleen: Normal in size without focal abnormality.  Adrenals/Urinary Tract: The right kidney is been surgically removed. The adrenal glands are within normal limits bilaterally. The left kidney demonstrates no renal calculi or obstructive changes. The bladder is partially distended.  Stomach/Bowel: Stomach is within normal limits. Appendix appears normal. No evidence of bowel wall thickening, distention, or inflammatory changes.  Vascular/Lymphatic: No significant vascular  findings are present. No enlarged abdominal or pelvic lymph nodes.  Reproductive: Uterus and bilateral adnexa are unremarkable.  Other: No abdominal wall hernia or abnormality. No abdominopelvic ascites.  Musculoskeletal: No acute or significant osseous findings.  IMPRESSION: Status post right nephrectomy.  No acute abnormality noted.   Electronically Signed By: Alcide Clever M.D. On: 09/19/2017 14:09   Assessment & Plan:    1. Frequent UTI -Urine for culture We will switch prophylaxis to trimethroprim 100mg  qhs - Urinalysis, Routine w reflex microscopic  2. Nephrolithiasis -followup 6 months with K[facility].   No follow-ups on file.  Wilkie Aye, MD  [facility] [place]

## 2023-01-26 ENCOUNTER — Other Ambulatory Visit: Payer: Self-pay | Admitting: Medical Genetics

## 2023-01-26 ENCOUNTER — Encounter: Payer: Self-pay | Admitting: Urology

## 2023-01-26 LAB — URINE CULTURE

## 2023-01-30 ENCOUNTER — Other Ambulatory Visit (HOSPITAL_COMMUNITY): Payer: MEDICAID | Attending: Medical Genetics

## 2023-03-03 ENCOUNTER — Ambulatory Visit: Payer: MEDICAID | Admitting: Pulmonary Disease

## 2023-03-03 ENCOUNTER — Ambulatory Visit (INDEPENDENT_AMBULATORY_CARE_PROVIDER_SITE_OTHER): Payer: MEDICAID

## 2023-03-03 ENCOUNTER — Encounter (INDEPENDENT_AMBULATORY_CARE_PROVIDER_SITE_OTHER): Payer: Self-pay | Admitting: Otolaryngology

## 2023-03-03 ENCOUNTER — Ambulatory Visit: Payer: MEDICAID | Admitting: Primary Care

## 2023-03-03 ENCOUNTER — Encounter: Payer: Self-pay | Admitting: Primary Care

## 2023-03-03 VITALS — BP 117/82 | HR 92 | Temp 96.9°F | Ht 69.0 in | Wt 313.0 lb

## 2023-03-03 DIAGNOSIS — R490 Dysphonia: Secondary | ICD-10-CM

## 2023-03-03 DIAGNOSIS — R053 Chronic cough: Secondary | ICD-10-CM

## 2023-03-03 DIAGNOSIS — J440 Chronic obstructive pulmonary disease with acute lower respiratory infection: Secondary | ICD-10-CM

## 2023-03-03 DIAGNOSIS — J209 Acute bronchitis, unspecified: Secondary | ICD-10-CM

## 2023-03-03 LAB — PULMONARY FUNCTION TEST
DL/VA % pred: 95 %
DL/VA: 4.1 ml/min/mmHg/L
DLCO cor % pred: 84 %
DLCO cor: 22.01 ml/min/mmHg
DLCO unc % pred: 84 %
DLCO unc: 22.01 ml/min/mmHg
FEF 25-75 Post: 2.02 L/s
FEF 25-75 Pre: 1.82 L/s
FEF2575-%Change-Post: 10 %
FEF2575-%Pred-Post: 57 %
FEF2575-%Pred-Pre: 52 %
FEV1-%Change-Post: 2 %
FEV1-%Pred-Post: 82 %
FEV1-%Pred-Pre: 80 %
FEV1-Post: 2.91 L
FEV1-Pre: 2.84 L
FEV1FVC-%Change-Post: 1 %
FEV1FVC-%Pred-Pre: 83 %
FEV6-%Change-Post: 0 %
FEV6-%Pred-Post: 97 %
FEV6-%Pred-Pre: 96 %
FEV6-Post: 4.16 L
FEV6-Pre: 4.13 L
FEV6FVC-%Change-Post: 0 %
FEV6FVC-%Pred-Post: 101 %
FEV6FVC-%Pred-Pre: 101 %
FVC-%Change-Post: 0 %
FVC-%Pred-Post: 95 %
FVC-%Pred-Pre: 94 %
FVC-Post: 4.16 L
FVC-Pre: 4.13 L
Post FEV1/FVC ratio: 70 %
Post FEV6/FVC ratio: 100 %
Pre FEV1/FVC ratio: 69 %
Pre FEV6/FVC Ratio: 100 %
RV % pred: 104 %
RV: 1.88 L
TLC % pred: 101 %
TLC: 5.88 L

## 2023-03-03 MED ORDER — SPIRIVA RESPIMAT 2.5 MCG/ACT IN AERS: 2.0000 | INHALATION_SPRAY | Freq: Every day | RESPIRATORY_TRACT | Status: AC

## 2023-03-03 MED ORDER — AZITHROMYCIN 250 MG PO TABS: ORAL_TABLET | ORAL | 0 refills | Status: AC

## 2023-03-03 NOTE — Progress Notes (Signed)
Full PFT performed today. °

## 2023-03-03 NOTE — Addendum Note (Signed)
Addended by: Glenford Bayley on: 03/03/2023 02:33 PM   Modules accepted: Orders

## 2023-03-03 NOTE — Addendum Note (Signed)
Addended by: Gay Filler T on: 03/03/2023 11:36 AM   Modules accepted: Orders

## 2023-03-03 NOTE — Patient Instructions (Signed)
Full PFT performed today. °

## 2023-03-03 NOTE — Progress Notes (Signed)
@Patient  ID: Alice [name], female    DOB: 1985/10/13, 37 y.o.   MRN: [id number]  Chief Complaint  Patient presents with   Follow-up    Referring provider: No ref. provider found  HPI: 38 year old, former smoker quit in Dec 2023 (7.5 pack year hx), second hand exposure.   Previous [facility] pulmonary encounter: Dr. Delena Bali with history of severe obesity, GERD, loin pain hematuria s/p nephrectomy recent admission to ICU for acute hypoxic respiratory failure due to influenza B pneumonia, possible superimposed CAP requiring intubation.  Had another admission 03/24/22 at [facility]  She was treated for UTI with nonobstructing stone in left kidney.   She now has very productive cough- lots of mucus, greenish, occasionally blood tinged. She has severe DOE. This has been going on since maybe a week since she was discharged from [facility]. She says she usually feels better when she's on antibiotics then worse when she's off them.  Had covid-19 a couple times but was never hospitalized for it.   Otherwise pertinent review of systems is negative.  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 Was in law enforcement distant past. She smoked for 8 years - half ppd, quit 11/2021. No vaping, MJ. She has a cat.     03/03/2023- Interim hx  Discussed the use of AI scribe software for clinical note transcription with the patient, who gave verbal consent to proceed.  History of Present Illness   The patient, with a history of hypoxic respiratory failure due to influenza and pneumonia, presents with a persistent cough and voice hoarseness. The cough, initially present after a prolonged ICU stay and intubation in February of the previous year, had resolved for several months but has recently returned. The patient describes coughing up large, solid, gray mucus plugs, similar to those experienced post-ICU stay. The cough is particularly  bothersome at night, with associated wheezing noted by the patient.  The patient also reports persistent voice hoarseness since her ICU stay, with no improvement over time. She has not been able to scream or raise her voice since the ICU stay.  The patient has a history of smoking, with an estimated 7.5 pack-year history, but quit smoking before her ICU admission. She also reports a history of secondhand smoke exposure during childhood.  The patient has been on Breztri for her respiratory symptoms, but the effectiveness of this treatment is unclear. She also takes omeprazole for reflux and Mucinex DM at night.   Allergies  Allergen Reactions   Cinnamon Anaphylaxis    Cinnamon flavoring    Other Swelling    Artificial cinnamon causes tongue to swell   Reglan [Metoclopramide] Other (See Comments)    Tactile disturbances REACTION: causes skin to "crawl"   Cinnamon Flavoring Agent (Non-Screening) Swelling    Artificial cinnamon causes tongue to swell   Codeine Nausea Only    Straight codeine only Percocet & vicodin are tolerated by pt.   Flomax [Tamsulosin] Other (See Comments)    Other reaction(s): Other (See Comments) Oral sores Oral sores   Ibuprofen Other (See Comments)    Stomach pain Causes severe pain in stomach Pt can tolerate if used sparingly   Levofloxacin Hives   Tylenol [Acetaminophen] Other (See Comments)    Pt reports Tylenol causes her kidney to bleed.    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 [name] at [facility]   Morbid obesity [facility])    Preterm labor    Round ligament pain 09/10/2012   Torn ACL     Tobacco History: Social History   Tobacco Use  Smoking Status Former   Current packs/day: 0.00   Average packs/day: 0.5 packs/day for 8.8 years (4.4 ttl pk-yrs)   Types: Cigarettes   Start date: 02/14/2013   Quit date: 12/15/2021   Years since quitting: 1.2  Smokeless Tobacco Never   Counseling given: Not Answered   Outpatient Medications Prior to Visit  Medication Sig Dispense Refill   albuterol (VENTOLIN HFA) 108 (90 Base) MCG/ACT inhaler Inhale 2 puffs into the lungs every 6 (six) hours as needed for wheezing or shortness of breath. 8 g 6   Budeson-Glycopyrrol-Formoterol (BREZTRI AEROSPHERE) 160-9-4.8 MCG/ACT AERO Inhale 2 puffs into the lungs in the morning and at bedtime. 5.9 g 0   Ferrous Sulfate (IRON SUPPLEMENT PO) Take 1 tablet by mouth in the morning and at bedtime.     gabapentin (NEURONTIN) 100 MG capsule Take 1 capsule (100 mg total) by mouth 2 (two) times daily. 60 capsule 0   montelukast (SINGULAIR) 10 MG tablet Take by mouth.     naloxone (NARCAN) nasal spray 4 mg/0.1 mL      nitrofurantoin (MACRODANTIN) 50 MG capsule Take 1 capsule (50 mg total) by mouth at bedtime. 30 capsule 11   OLANZapine (ZYPREXA) 10 MG tablet Take 1 tablet (10 mg total) by mouth at bedtime. 30 tablet 1   omeprazole (PRILOSEC) 40 MG capsule Take 1 capsule (40 mg total) by mouth daily. 30 capsule 0   ondansetron (ZOFRAN-ODT) 4 MG disintegrating tablet Take 1 tablet (4 mg total) by mouth every 8 (eight) hours as needed for nausea. 10 tablet 0   oxyCODONE (OXY IR/ROXICODONE) 5 MG immediate release tablet Take 1 tablet (5 mg total) by mouth every 6 (six) hours as needed for moderate pain or severe pain. 30 tablet 0   pravastatin (PRAVACHOL) 80 MG tablet Take by mouth.     SUMAtriptan (IMITREX) 25 MG tablet Take 25 mg by mouth as needed.     trimethoprim  (TRIMPEX) 100 MG tablet Take 1 tablet (100 mg total) by mouth at bedtime. 30 tablet 11   cholecalciferol (VITAMIN D3) 25 MCG (1000 UNIT) tablet Take by mouth. (Patient not taking: Reported on 06/17/2022)     metoprolol tartrate (LOPRESSOR) 25 MG tablet Take 1 tablet (25 mg total) by mouth 2 (two) times daily. (Patient not taking: Reported on 03/03/2023) 60 tablet 3   nystatin (MYCOSTATIN/NYSTOP) powder Apply topically 3 times daily. (Patient not taking: Reported on 03/03/2023) 15 g 0   No facility-administered medications prior to visit.    Review of Systems  Review of Systems  Constitutional: Negative.   HENT: Negative.    Respiratory:  Positive for cough.        Nocturnal wheeze   Cardiovascular: Negative.      Physical Exam  BP  117/82 (BP Location: Right Arm, Patient Position: Sitting, Cuff Size: Large)   Pulse 92   Temp (!) 96.9 F (36.1 C) (Temporal)   Ht 5\' 9"  (1.753 m)   Wt (!) 313 lb (142 kg)   SpO2 100%   BMI 46.22 kg/m  Physical Exam Constitutional:      Appearance: Normal appearance.  HENT:     Head: Normocephalic and atraumatic.  Cardiovascular:     Rate and Rhythm: Normal rate and regular rhythm.  Pulmonary:     Effort: Pulmonary effort is normal.     Breath sounds: No wheezing or rhonchi.  Musculoskeletal:        General: Normal range of motion.  Skin:    General: Skin is warm and dry.  Neurological:     General: No focal deficit present.     Mental Status: She is alert and oriented to person, place, and time. Mental status is at baseline.  Psychiatric:        Mood and Affect: Mood normal.        Behavior: Behavior normal.        Thought Content: Thought content normal.        Judgment: Judgment normal.      Lab Results:  CBC    Component Value Date/Time   WBC 10.3 01/02/2023 1634   RBC 4.42 01/02/2023 1634   HGB 12.5 01/02/2023 1634   HGB 13.2 07/13/2018 0854   HCT 40.9 01/02/2023 1634   HCT 41.4 07/13/2018 0854   PLT 316 01/02/2023 1634    PLT 338 07/13/2018 0854   MCV 92.5 01/02/2023 1634   MCV 99 (H) 07/13/2018 0854   MCH 28.3 01/02/2023 1634   MCHC 30.6 01/02/2023 1634   RDW 15.5 01/02/2023 1634   RDW 14.2 07/13/2018 0854   LYMPHSABS 3.5 03/03/2022 0747   LYMPHSABS 2.3 07/13/2018 0854   MONOABS 0.7 03/03/2022 0747   EOSABS 0.3 03/03/2022 0747   EOSABS 0.6 (H) 07/13/2018 0854   BASOSABS 0.1 03/03/2022 0747   BASOSABS 0.1 07/13/2018 0854    BMET    Component Value Date/Time   NA 138 01/02/2023 1634   NA 138 11/02/2018 1646   K 4.3 01/02/2023 1634   CL 106 01/02/2023 1634   CO2 23 01/02/2023 1634   GLUCOSE 98 01/02/2023 1634   BUN 16 01/02/2023 1634   BUN 15 11/02/2018 1646   CREATININE 0.97 01/02/2023 1634   CREATININE 0.81 04/29/2013 1450   CALCIUM 8.7 (L) 01/02/2023 1634   GFRNONAA >60 01/02/2023 1634   GFRAA 93 11/02/2018 1646    BNP No results found for: "BNP"  ProBNP No results found for: "PROBNP"  Imaging: No results found.   Assessment & Plan:   1. Voice hoarsenesses (Primary) - Ambulatory referral to ENT  2. Acute bronchitis with COPD (HCC) - DG Chest 2 View; Future     Post-Intubation Vocal Cord Dysfunction -Persistent hoarseness since ICU stay and intubation in 2024. No prior ENT evaluation. Refer to Dr. Burnadette Peter for ENT evaluation.  Chronic Obstructive Pulmonary Disease (COPD) Mild obstructive lung disease on PFTs without reversibility. Recent increase in cough and mucus production. History of smoking with 7.5 pack-year history, quit in 2023. -Order chest X-ray today. If abnormal, consider CT scan. -Discontinue Breztri and start Spiriva Respimat 2.42mcg  -Continue Albuterol every 4-6 hours as needed for chest tightness or shortness of breath. -Continue Mucinex twice daily.  Gastroesophageal Reflux Disease (GERD) - Continue Omeprazole 40mg  prior to dinner  Snoring -  Patient would like to hold off on sleep study but likely needs to be address at follow-up   Follow-up in 3  months or sooner if symptoms worsen.      Glenford Bayley, NP 03/03/2023

## 2023-03-03 NOTE — Patient Instructions (Signed)
 -  POST-INTUBATION VOCAL CORD DYSFUNCTION: This condition involves damage or dysfunction of the vocal cords following intubation. We will refer you to Dr. Burnadette Peter for an ENT evaluation to further assess and manage your persistent hoarseness.  -CHRONIC OBSTRUCTIVE PULMONARY DISEASE (COPD): COPD is a chronic inflammatory lung disease that obstructs airflow from the lungs. We will order a chest X-ray today and, if needed, a CT scan. We will discontinue Breztri and deescalate therapy to Spiriva Respimat 2.35mcg two puffs once daily in the morning. Continue using Albuterol every 4-6 hours as needed and Mucinex twice daily.  -GASTROESOPHAGEAL REFLUX DISEASE (GERD): GERD is a digestive disorder where stomach acid frequently flows back into the esophagus. Continue taking Omeprazole 40mg  at night as prescribed.  INSTRUCTIONS: Please follow up in 3 months or sooner if your symptoms worsen. Make sure to get the chest X-ray done today, and we will decide on further imaging based on the results.  Follow-up: 3 months with Dr. Francine Graven (30 min- new patient)

## 2023-04-21 ENCOUNTER — Encounter (INDEPENDENT_AMBULATORY_CARE_PROVIDER_SITE_OTHER): Payer: Self-pay | Admitting: Otolaryngology

## 2023-04-21 ENCOUNTER — Ambulatory Visit (INDEPENDENT_AMBULATORY_CARE_PROVIDER_SITE_OTHER): Payer: MEDICAID | Admitting: Otolaryngology

## 2023-04-21 VITALS — BP 109/77 | HR 109 | Ht 67.0 in | Wt 304.0 lb

## 2023-04-21 DIAGNOSIS — R0982 Postnasal drip: Secondary | ICD-10-CM | POA: Diagnosis not present

## 2023-04-21 DIAGNOSIS — J3089 Other allergic rhinitis: Secondary | ICD-10-CM

## 2023-04-21 DIAGNOSIS — R0981 Nasal congestion: Secondary | ICD-10-CM | POA: Diagnosis not present

## 2023-04-21 DIAGNOSIS — J342 Deviated nasal septum: Secondary | ICD-10-CM

## 2023-04-21 DIAGNOSIS — K219 Gastro-esophageal reflux disease without esophagitis: Secondary | ICD-10-CM | POA: Diagnosis not present

## 2023-04-21 DIAGNOSIS — J382 Nodules of vocal cords: Secondary | ICD-10-CM | POA: Diagnosis not present

## 2023-04-21 DIAGNOSIS — R490 Dysphonia: Secondary | ICD-10-CM

## 2023-04-21 DIAGNOSIS — J343 Hypertrophy of nasal turbinates: Secondary | ICD-10-CM

## 2023-04-21 DIAGNOSIS — J449 Chronic obstructive pulmonary disease, unspecified: Secondary | ICD-10-CM

## 2023-04-21 MED ORDER — METHYLPREDNISOLONE 4 MG PO TBPK: ORAL_TABLET | ORAL | 1 refills | Status: AC

## 2023-04-21 MED ORDER — DOXYCYCLINE HYCLATE 100 MG PO TABS: 100.0000 mg | ORAL_TABLET | Freq: Two times a day (BID) | ORAL | 0 refills | Status: AC

## 2023-04-21 MED ORDER — OMEPRAZOLE 40 MG PO CPDR: 40.0000 mg | DELAYED_RELEASE_CAPSULE | Freq: Two times a day (BID) | ORAL | 3 refills | Status: DC

## 2023-04-21 MED ORDER — CETIRIZINE HCL 10 MG PO TABS: 10.0000 mg | ORAL_TABLET | Freq: Every day | ORAL | 11 refills | Status: AC

## 2023-04-21 MED ORDER — FLUTICASONE PROPIONATE 50 MCG/ACT NA SUSP: 2.0000 | Freq: Two times a day (BID) | NASAL | 6 refills | Status: AC

## 2023-04-21 NOTE — Patient Instructions (Addendum)
-   Take Reflux Gourmet (natural supplement available on Amazon) to help with symptoms of chronic throat irritation    GamingLesson.nl - check out this website to learn more about reflux   -Avoid lying down for at least two hours after a meal or after drinking acidic beverages, like soda, or other caffeinated beverages. This can help to prevent stomach contents from flowing back into the esophagus. -Keep your head elevated while you sleep. Using an extra pillow or two can also help to prevent reflux. -Eat smaller and more frequent meals each day instead of a few large meals. This promotes digestion and can aid in preventing heartburn. -Wear loose-fitting clothes to ease pressure on the stomach, which can worsen heartburn and reflux. -Reduce excess weight around the midsection. This can ease pressure on the stomach. Such pressure can force some stomach contents back up the esophagus   Lloyd Huger Med Nasal Saline Rinse   - start nasal saline rinses with [employer] available over the counter or online to help with nasal congestion

## 2023-04-21 NOTE — Progress Notes (Signed)
 ENT CONSULT:  Reason for Consult: dysphonia after intubation/coma    HPI: Discussed the use of AI scribe software for clinical note transcription with the patient, who gave verbal consent to proceed.  History of Present Illness   The patient is a 50 yoF with hx of Influenza/PNA and hypoxic respiratory failure requiring intubation and ICU stay presents with persistent hoarseness and vocal changes following intubation. She was referred by a pulmonologist for evaluation of vocal changes and lung issues.  Since the intubation, she has experienced persistent hoarseness, inability to project and sound louder, and difficulty singing, which she could do prior to the coma. She was in a coma from February 14, 2023, to February 23, 2023, due to influenza B, pneumonia, and sepsis, requring intubation, of unknown duration. Since then, she has been coughing up 'really nasty stuff' and blowing green mucus from her nose.  She has a history of recurrent pneumonia, having had it approximately nine times, and was recently diagnosed with mild COPD. She quit smoking in 2023 and currently uses Spiriva for her lung condition. She experiences shortness of breath with movement and is in the process of losing weight, having lost 16 pounds in two months on Zepbound.  She has a history of heartburn and takes omeprazole daily before breakfast. She reports occasional swallowing difficulties but denies chronic issues.  She has been experiencing a rash on her eyelid and is undergoing testing for lupus.  She has three children, two of whom live with her constantly, while the oldest splits time between her and his father. She has some exposure to secondhand smoke from a friend who smokes, though not daily. She takes Singulair daily for nasal congestion and has difficulty using Flonase correctly.      Records Reviewed:  Pulm office visit 03/03/23 38 year old, former smoker quit in Dec 2023 (7.5 pack year hx), second hand exposure.     Previous [facility] pulmonary encounter: Dr. Delena Bali with history of severe obesity, GERD, loin pain hematuria s/p nephrectomy recent admission to ICU for acute hypoxic respiratory failure due to influenza B pneumonia, possible superimposed CAP requiring intubation.   Had another admission 03/24/22 at [facility]   She was treated for UTI with nonobstructing stone in left kidney.    She now has very productive cough- lots of mucus, greenish, occasionally blood tinged. She has severe DOE. This has been going on since maybe a week since she was discharged from [facility]. She says she usually feels better when she's on antibiotics then worse when she's off them.   Had covid-19 a couple times but was never hospitalized for it.    Otherwise pertinent review of systems is negative.   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 She smoked for 8 years - half ppd, quit 11/2021. No vaping, MJ. She has a cat.   The patient, with a history of hypoxic respiratory failure due to influenza and pneumonia, presents with a persistent cough and voice hoarseness. The cough, initially present after a prolonged ICU stay and intubation in February of the previous year, had resolved for several months but has recently returned. The patient describes coughing up large, solid, gray mucus plugs, similar to those experienced post-ICU stay. The cough is particularly bothersome at night, with associated wheezing noted by the patient.   The patient also reports persistent voice hoarseness since her ICU stay, with  no improvement over time. She has not been able to scream or raise her voice since the ICU stay.   The patient has a history of smoking, with an estimated 7.5 pack-year history, but quit smoking before her ICU admission. She also reports a history of secondhand smoke exposure during childhood.   The patient has been on  Breztri for her respiratory symptoms, but the effectiveness of this treatment is unclear. She also takes omeprazole for reflux and Mucinex DM at night.  1. Voice hoarsenesses (Primary) - Ambulatory referral to ENT   2. Acute bronchitis with COPD (HCC) - DG Chest 2 View; Future      Post-Intubation Vocal Cord Dysfunction -Persistent hoarseness since ICU stay and intubation in 2024. No prior ENT evaluation. Refer to Dr. Burnadette Peter for ENT evaluation.   Chronic Obstructive Pulmonary Disease (COPD) Mild obstructive lung disease on PFTs without reversibility. Recent increase in cough and mucus production. History of smoking with 7.5 pack-year history, quit in 2023. -Order chest X-ray today. If abnormal, consider CT scan. -Discontinue Breztri and start Spiriva Respimat 2.60mcg  -Continue Albuterol every 4-6 hours as needed for chest tightness or shortness of breath. -Continue Mucinex twice daily.   Gastroesophageal Reflux Disease (GERD) - Continue Omeprazole 40mg  prior to dinner   Snoring - Patient would like to hold off on sleep study but likely needs to be address at follow-up    Follow-up in 3 months or sooner if symptoms worsen.     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 [name] at [facility]   Morbid obesity [facility])    Preterm labor    Round ligament pain 09/10/2012   Torn ACL     Past Surgical History:  Procedure Laterality Date   addenoidectomy     CESAREAN SECTION N/A 02/22/2013   Procedure: CESAREAN SECTION/TWINS;  Surgeon: [name], MD;  Location: [facility];  Service: Obstetrics;  Laterality: N/A;   CHOLECYSTECTOMY      GALLBLADDER SURGERY  2019   NASAL SINUS SURGERY     NEPHRECTOMY     right side   OTHER SURGICAL HISTORY     ulner nerve removal   TONSILLECTOMY      Family History  Problem Relation Age of Onset   Hypertension Mother    Hyperlipidemia Mother    Heart disease Mother    Heart disease Father        MI   Alcohol abuse Father    Alcohol abuse Brother    Diabetes Maternal Grandmother    Hyperlipidemia Maternal Grandmother     Social History:  reports that she quit smoking about 16 months ago. Her smoking use included cigarettes. She started smoking about 10 years ago. She has a 4.4 pack-year smoking history. She has never used smokeless tobacco. She reports that she does not drink alcohol and does not use drugs.  Allergies:  Allergies  Allergen Reactions   Cinnamon Anaphylaxis    Cinnamon flavoring    Other Swelling    Artificial cinnamon causes tongue to swell   Reglan [Metoclopramide]  Other (See Comments)    Tactile disturbances REACTION: causes skin to "crawl"   Cinnamon Flavoring Agent (Non-Screening) Swelling    Artificial cinnamon causes tongue to swell   Codeine Nausea Only    Straight codeine only Percocet & vicodin are tolerated by pt.   Flomax [Tamsulosin] Other (See Comments)    Other reaction(s): Other (See Comments) Oral sores Oral sores   Ibuprofen Other (See Comments)    Stomach pain Causes severe pain in stomach Pt can tolerate if used sparingly   Levofloxacin Hives   Tylenol [Acetaminophen] Other (See Comments)    Pt reports Tylenol causes her kidney to bleed.    Medications: I have reviewed the patient's current medications.  The PMH, PSH, Medications, Allergies, and SH were reviewed and updated.  ROS: Constitutional: Negative for fever, weight loss and weight gain. Cardiovascular: Negative for chest pain and dyspnea on exertion. Respiratory: Is not experiencing shortness of breath at rest. Gastrointestinal: Negative for nausea and  vomiting. Neurological: Negative for headaches. Psychiatric: The patient is not nervous/anxious  Blood pressure 109/77, pulse (!) 109, height 5\' 7"  (1.702 m), weight (!) 304 lb (137.9 kg), SpO2 98%. Body mass index is 47.61 kg/m.   PHYSICAL EXAM:  Exam: General: Well-developed, well-nourished Communication and Voice: raspy Respiratory Respiratory effort: Equal inspiration and expiration without stridor Cardiovascular Peripheral Vascular: Warm extremities with equal color/perfusion Eyes: No nystagmus with equal extraocular motion bilaterally Neuro/Psych/Balance: Patient oriented to person, place, and time; Appropriate mood and affect; Gait is intact with no imbalance; Cranial nerves I-XII are intact Head and Face Inspection: Normocephalic and atraumatic without mass or lesion Palpation: Facial skeleton intact without bony stepoffs Salivary Glands: No mass or tenderness Facial Strength: Facial motility symmetric and full bilaterally ENT Pinna: External ear intact and fully developed External canal: Canal is patent with intact skin Tympanic Membrane: Clear and mobile External Nose: No scar or anatomic deformity Internal Nose: Septum is deviated to the left. No polyp, or purulence. Mucosal edema and erythema present.  Bilateral inferior turbinate hypertrophy.  Lips, Teeth, and gums: Mucosa and teeth intact and viable TMJ: No pain to palpation with full mobility Oral cavity/oropharynx: No erythema or exudate, no lesions present Nasopharynx: No mass or lesion with intact mucosa Hypopharynx: Intact mucosa without pooling of secretions Larynx Glottic: Full true vocal cord mobility without lesion or mass but thickening along medial edge at the mid 1/3 of b/l VF c/w VF nodules Supraglottic: Normal appearing epiglottis and AE folds Interarytenoid Space: Moderate pachydermia&edema Subglottic Space: Patent without lesion or edema Neck Neck and Trachea: Midline trachea without mass or  lesion Thyroid: No mass or nodularity Lymphatics: No lymphadenopathy  Procedure:  Preoperative diagnosis: hoarseness  Postoperative diagnosis:   same + VF nodules + chronic laryngitis + GERD LPR  Procedure: Flexible fiberoptic laryngoscopy with stroboscopy (96045)   Surgeon: Ashok Croon, MD  Anesthesia: Topical lidocaine and Afrin  Complications: None  Condition is stable throughout exam  Indications and consent:   The patient presents to the clinic with hoarseness. All the risks, benefits, and potential complications were reviewed with the patient preoperatively and informed verbal consent was obtained.  Procedure: The patient was seated upright in the exam chair.   Topical lidocaine and Afrin were applied to the nasal cavity. After adequate anesthesia had occurred, the flexible telescope with strobe capabilities was passed into the nasal cavity. The nasopharynx was patent without mass or lesion. The scope was passed behind the soft palate and directed toward the base of tongue.  The base of tongue was visualized and was symmetric with no apparent masses or abnormal appearing tissue. There were no signs of a mass or pooling of secretions in the piriform sinuses. The supraglottic structures were normal.  The true vocal cords are mobile. The medial edges were straight with thickening along mid 1/3 at the edges. Closure was complete. Periodicity present. The mucosal wave and amplitude were normal. There is severe interarytenoid pachydermia and post cricoid edema. The mucosa appears edematous.   The laryngoscope was then slowly withdrawn and the patient tolerated the procedure well. There were no complications or blood loss.      PROCEDURE NOTE: nasal endoscopy  Preoperative diagnosis: chronic sinusitis symptoms  Postoperative diagnosis: same  Procedure: Diagnostic nasal endoscopy (40981)  Surgeon: Ashok Croon, M.D.  Anesthesia: Topical lidocaine and Afrin  H&P REVIEW:  The patient's history and physical were reviewed today prior to procedure. All medications were reviewed and updated as well. Complications: None Condition is stable throughout exam Indications and consent: The patient presents with symptoms of chronic sinusitis not responding to previous therapies. All the risks, benefits, and potential complications were reviewed with the patient preoperatively and informed consent was obtained. The time out was completed with confirmation of the correct procedure.   Procedure: The patient was seated upright in the clinic. Topical lidocaine and Afrin were applied to the nasal cavity. After adequate anesthesia had occurred, the rigid nasal endoscope was passed into the nasal cavity. The nasal mucosa, turbinates, septum, and sinus drainage pathways were visualized bilaterally. This revealed no purulence or significant secretions that might be cultured. There were no polyps or sites of significant inflammation. The mucosa was intact and there was no crusting present. The scope was then slowly withdrawn and the patient tolerated the procedure well. There were no complications or blood loss.      Studies Reviewed: 03/03/2023 CXR FINDINGS: No consolidation, pneumothorax or effusion. No edema. Normal cardiopericardial silhouette. Slight degenerative changes of the spine on lateral view.   IMPRESSION: No acute cardiopulmonary disease.  Assessment/Plan: Encounter Diagnoses  Name Primary?   Chronic GERD    Dysphonia Yes   Vocal fold nodules    Chronic nasal congestion    Environmental and seasonal allergies    Hypertrophy of both inferior nasal turbinates    Nasal septal deviation    Post-nasal drip     Assessment and Plan    Chronic Hoarseness for over a year Persistent hoarseness and inability to project  or sing since extubation following an intubation for hypoxic respiratory failure in January 2024. Examination revealed vocal fold nodules and mild  edema likely due to phonotrauma and changes c/w GERD LPR, with no subglottic narrowing, other lesions or VF paralysis. Voice therapy and reflux control are recommended with repeat scope exam following full course of voice therapy. We discussed taking Doxy/Medrol Dose Pack for chronic laryngitis and voice rest x 48 hrs. She has young children and we discussed trying to avoid whispering or shouting.  - Refer to speech therapy for voice therapy. - Prescribe doxycycline and Medrol Dosepak. - Advised voice rest for two days when taking Doxy/Medrol pack - Increase omeprazole 40 mg to twice daily. - Recommend Reflux Gourmet after meals and before bed to prevent reflux. - Plan follow-up in three months for reassessment and rescope.  Gastroesophageal Reflux Disease (GERD) GERD likely contributing to voice changes. Potential exacerbation due to Zepbound medication she currently takes for weight loss.  - Increase omeprazole 40 mg to twice daily. -  Recommend Reflux Gourmet after meals and before bed. - diet and lifestyle changes   Chronic Nasal congestion and concern for Sinusitis Complaints of coughing up green mucus and blowing green discharge from the nose. Exam including nasal endoscopy with small amount of crusting/thick mucus in nasal passages. No definitive sinus infection diagnosed, but nasal congestion and discharge present. Nasal saline rinses are recommended to clear the discharge. Will be taking Doxy, which will cover for any inflammation in the sinuses as well.  - Recommend nasal saline rinses with NeilMed bottle. - trial of Zyrtec 10 mg daily and Flonase 2 puffs b/l nares BID - will consider CT sinuses in the future and allergy testing if sx persist  Chronic Obstructive Pulmonary Disease (COPD) Mild COPD, former smoker, quit in 2023. Currently on Spiriva inhaler. No acute exacerbation noted. - continue to f/u with Pulm and use Spiriva - we discussed rinsing after inhaler use   Suspected  Lupus Undergoing testing for lupus due to recent rash on eyelids/cheeks and other symptoms. Seeing rheumatology and neurology for evaluation. - continue current workup  BMI > 47 Weight Loss Management Currently on Zepbound for weight loss, with a reported loss of 16 pounds in two months. Aware of potential side effects including reflux.  - continue current therapy    Thank you for allowing me to participate in the care of this patient. Please do not hesitate to contact me with any questions or concerns.   Ashok Croon, MD Otolaryngology [facility] Phone: [phone] Fax: [phone]    04/21/2023, 9:27 AM

## 2023-04-28 ENCOUNTER — Ambulatory Visit: Payer: MEDICAID | Admitting: Urology

## 2023-05-02 ENCOUNTER — Ambulatory Visit: Payer: MEDICAID

## 2023-05-09 ENCOUNTER — Ambulatory Visit: Payer: MEDICAID

## 2023-05-10 ENCOUNTER — Ambulatory Visit: Payer: MEDICAID | Admitting: Urology

## 2023-05-17 ENCOUNTER — Ambulatory Visit: Payer: MEDICAID | Attending: Otolaryngology

## 2023-05-17 ENCOUNTER — Other Ambulatory Visit: Payer: Self-pay

## 2023-05-17 DIAGNOSIS — J382 Nodules of vocal cords: Secondary | ICD-10-CM | POA: Insufficient documentation

## 2023-05-17 DIAGNOSIS — R490 Dysphonia: Secondary | ICD-10-CM | POA: Insufficient documentation

## 2023-05-17 NOTE — Therapy (Addendum)
 OUTPATIENT SPEECH LANGUAGE PATHOLOGY VOICE EVALUATION   Patient Name: Alice [name] MRN: [id number] DOB:[birth year], 38 y.o., female Today's Date: 05/17/2023  PCP: [facility] REFERRING PROVIDER: Ashok Croon, MD  END OF SESSION:  End of Session - 05/17/23 1645     Visit Number 1    Number of Visits 17    Date for SLP Re-Evaluation 07/21/23    SLP Start Time 0805    SLP Stop Time  0846    SLP Time Calculation (min) 41 min    Activity Tolerance Patient tolerated treatment well             Past Medical History:  Diagnosis Date   Chronic left flank pain    Chronic pain    Congenital obstruction of ureteropelvic junction (UPJ)    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 [name] at [facility]   Morbid obesity [facility])    Preterm labor    Round ligament pain 09/10/2012   Torn ACL    Past Surgical History:  Procedure Laterality Date   addenoidectomy     CESAREAN SECTION N/A 02/22/2013   Procedure: CESAREAN SECTION/TWINS;  Surgeon: [name], MD;  Location: [facility];  Service: Obstetrics;  Laterality: N/A;   CHOLECYSTECTOMY     GALLBLADDER SURGERY  2019   NASAL SINUS SURGERY     NEPHRECTOMY     right side   OTHER SURGICAL HISTORY     ulner nerve removal   TONSILLECTOMY     Patient Active Problem List   Diagnosis Date Noted   Sepsis due to undetermined organism (HCC) 02/14/2022   Chronic abdominal wound infection 02/14/2022   Influenza B 02/14/2022   Acute respiratory failure with hypoxia (HCC) 02/14/2022   Hypothermia 02/14/2022   High anion gap metabolic acidosis 02/14/2022   Lactic acidosis 02/14/2022   Nausea & vomiting 02/14/2022   Diarrhea 02/14/2022   Hypokalemia 02/14/2022   Hyponatremia 02/14/2022   AKI (acute kidney injury) (HCC)  02/14/2022   Prolonged QT interval 02/14/2022   Hypoglycemia 02/14/2022   Metabolic acidosis 02/14/2022   Acute metabolic encephalopathy 02/14/2022   Multifocal pneumonia 02/13/2022   Migraine 12/12/2021   Panniculitis 12/12/2021   Bilateral pneumonia 12/12/2021   GAD (generalized anxiety disorder) 01/24/2019   GERD without esophagitis 07/13/2018   Intractable migraine without aura and without status migrainosus 07/13/2018   High risk medication use 07/13/2018   Calculus of gallbladder with acute and chronic cholecystitis with obstruction 06/19/2017   Ulnar neuropathy of left upper extremity 04/05/2017   Primary osteoarthritis of left knee 08/10/2015   Mood disorder (HCC) 12/30/2014   Smoker 05/13/2014   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   Single kidney 05/12/2014   Elevated liver function tests 09/24/2012   S/P left knee arthroscopy 07/23/2012   History of recurrent UTI (urinary tract infection) 07/23/2012   Obesity, Class III, BMI 40-49.9 (morbid obesity) (HCC) 07/23/2012    Onset date: "About a year ago"; script written 04/21/23  REFERRING DIAG: R49.0 (ICD-10-CM) - Dysphonia 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 Recurrent pneumonia, mild COPD, heartburn   PAIN:  Are you having pain? Yes: NPRS scale: 3/10 Pain location: bil knees Pain description: ache Aggravating factors: staying still Relieving factors: moving, meds  FALLS: Has patient fallen in last 6 months? Yes, Number of falls: 1 on a rollaway bed  LIVING ENVIRONMENT: Lives with: lives with their family two ten year olds (one special needs), and a 39 year  old (splits time) Lives in: House/apartment  PLOF:Level of assistance: Independent with ADLs, Independent with IADLs Employment: Works in the home  PATIENT GOALS: "I would like to be able to sing, but at the least just get my normal voice back and not be hoarse every day."  OBJECTIVE:  Note: Objective measures were completed at Evaluation unless otherwise noted.  DIAGNOSTIC FINDINGS:  MBS 03/01/22 Clinical Impression Patient demonstrates improvement with her swallowing - functional oral swallow with piecemealing noted.     However, she continues with mild pharyngeal dysphagia with ongoing silent aspiration of thin and nectar liquids (mixed with secretions) during the swallow. Impaired laryngeal closure results in spillage of liquid barium into anterior and posterior trach during the swallow.  Chin tuck posture effective to prevent aspiration of nectar but not thin liquid even with tsp amount.    Patient needed encouragement- repositioning in recliner to be fully upright - pillow placed behind her for optimal position.  Patient is stronger overall fortunately however she continues to aspirate without clearance despite cued cough.     Recommend advance to dys3/nectar and allow tsps. Thin water between meals after mouth care for QOL and to continue to tax system to swallow thinner consistencies.     Pt may benefit from ENT consult due to her ongoing dysphonia - and silent aspiration.  Using video monitor live and teach back post-test - pt educated to recommendations. Pt was made aware to suspicion of advancing to nectar only prior to exam due to ongoing overt coughing with thin consistencies - concerning for overt aspiration. She is agreeable to plan.     Tubed swallow precaution signs to floor for RN.    SLP Visit Diagnosis Dysphagia, pharyngeal phase (R13.13);Dysphagia, pharyngoesophageal phase (R13.14)  Impact on safety and function Mild aspiration risk             03/01/2022    4:13 PM   Treatment Recommendations  Treatment Recommendations Therapy as outlined in treatment plan below          03/01/2022    5:08 PM  Prognosis  Prognosis for Safe Diet Advancement Good  Barriers to Reach Goals Other (Comment)          03/01/2022    4:13 PM  Diet Recommendations  SLP Diet Recommendations Dysphagia 3 (Mech soft) solids;Nectar thick liquid;Other (Comment)  Liquid Administration via Straw  Medication Administration Crushed with puree  Postural Changes Seated upright at 90 degrees;Remain semi-upright after after feeds/meals (Comment)      ENT - [place] 04/21/23 Assessment and Plan    Chronic Hoarseness for over a year Persistent hoarseness and inability to project  or sing since extubation following an intubation for hypoxic respiratory failure in January 2024. Examination revealed vocal fold nodules and mild edema likely due to phonotrauma and changes c/w GERD LPR, with no subglottic narrowing, other lesions or VF paralysis. Voice therapy and reflux  control are recommended with repeat scope exam following full course of voice therapy. We discussed taking Doxy/Medrol Dose Pack for chronic laryngitis and voice rest x 48 hrs. She has young children and we discussed trying to avoid whispering or shouting.  - Refer to speech therapy for voice therapy. - Prescribe doxycycline and Medrol Dosepak. - Advised voice rest for two days when taking Doxy/Medrol pack - Increase omeprazole 40 mg to twice daily. - Recommend Reflux Gourmet after meals and before bed to prevent reflux. - Plan follow-up in three months for reassessment and rescope.  COGNITION: Overall cognitive status: Within functional limits for tasks assessed  SOCIAL HISTORY: Occupation: Stay at home mom Water intake:  64 oz daily but has 2 caffeinated drinks/day Caffeine/alcohol intake: moderate Daily voice use: moderate - mostly before 7:30 am and between 3:30-8:30 pm, with an hour conversation with her sister each  day  PERCEPTUAL VOICE ASSESSMENT: Voice quality: hoarse and harsh Vocal abuse: habitual loudness (high) Resonance: normal Respiratory function: clavicular breathing  OBJECTIVE VOICE ASSESSMENT: Maximum phonation time for sustained "ah": 15.06 Conversational pitch average: 160 Hz Conversational pitch range: 96-224 Hz Conversational loudness average: 73 dB Conversational loudness range: 52-88 dB S/z ratio: 1.68 (Suggestive of dysfunction >1.0) Pt's speech volume appeared louder than WNL today. When basic vocal hygiene measures were discussed with pt (limit vocalization, limit volume, talk in a smooth gentle voice), pt reduced volume immediately for remainder of evaluation. SLP reiterated these three points multiple times today.  PATIENT REPORTED OUTCOME MEASURES (PROM): V-RQOL: provided in first session                                                                                                                            TREATMENT DATE:  Vocal Function Exercises=VFE  05/17/23: n/a  PATIENT EDUCATION: Education details: see "objective voice assessment" for more details Person educated: Patient Education method: Explanation and Demonstration Education comprehension: returned demonstration and needs further education  HOME EXERCISE PROGRAM: Abdominal breathing and VFE, eventually.  GOALS: Goals reviewed with patient? Yes generally  SHORT TERM GOALS: Target date: 06/23/23 (due to first day tx)  Pt will complete VFE with rare min A in two sessions Baseline: Goal status: INITIAL  2.  Pt will incr water to 80oz and decr caffeine intake between 4 consecutive sessions Baseline:  Goal status: INITIAL  3.  Pt will use speech volume at </= average 71 dB 80% of the time in 7 minutes conversation in 2 sessions Baseline:  Goal status: INITIAL  4.  Pt will use AB 80% of the time in 7 minutes conversation in 2 sessions Baseline:  Goal status: INITIAL   LONG TERM GOALS: Target  date: 07/21/23  Pt's PROM will improve compared to initial score Baseline:  Goal status: INITIAL  2.  Pt's s/z ratio will decr to be closer to 1.0 Baseline:  Goal status: INITIAL  3.   Pt will use speech volume at </= average 71 dB 80%  of the time in 10 minutes conversation in 2 sessions Baseline:  Goal status: INITIAL  4.  Pt will tell SLP she has followed at least 2 aspects of vocal hygiene program between 8 sessions Baseline:  Goal status: INITIAL  5.  Pt will complete VFE with mod I in two sessions Baseline:  Goal status: INITIAL  6.  Pt will use AB 80% of the time in 10 minutes conversation in 2 sessions Baseline:  Goal status: INITIAL   ASSESSMENT:  CLINICAL IMPRESSION: Patient is a 38 y.o. F who was seen today for assessment of voice in light of intubation in January and presence of vocal nodules identified in ENT exam 04/21/22. Pt spoke in higher than WNL volume today, has some vocally abusive behaviors with caffeine use, and exhibited vocal fry and chest breathing.   OBJECTIVE IMPAIRMENTS: include voice disorder. These impairments are limiting patient from household responsibilities, ADLs/IADLs, and effectively communicating at home and in community. Factors affecting potential to achieve goals and functional outcome are  none noted .Marland Kitchen Patient will benefit from skilled SLP services to address above impairments and improve overall function.  REHAB POTENTIAL: Good  PLAN:  SLP FREQUENCY: 2x/week  SLP DURATION: 8 weeks  PLANNED INTERVENTIONS: Environmental controls, Internal/external aids, Functional tasks, SLP instruction and feedback, Compensatory strategies, Patient/family education, and [id number] Treatment of speech (30 or 45 min) and voice exercises.    Sharonlee Nine, CCC-SLP 05/17/2023, 4:46 PM   For all possible CPT codes, reference the Planned Interventions line above.     Check all conditions that are expected to impact treatment: {Conditions expected to impact  treatment:None of these apply   If treatment provided at initial evaluation, no treatment charged due to lack of authorization.

## 2023-05-22 ENCOUNTER — Ambulatory Visit: Payer: MEDICAID

## 2023-05-25 ENCOUNTER — Ambulatory Visit: Payer: MEDICAID

## 2023-05-25 DIAGNOSIS — R490 Dysphonia: Secondary | ICD-10-CM | POA: Diagnosis not present

## 2023-05-25 NOTE — Patient Instructions (Signed)
 VOICE HYGIENE/CONSERVATION PROGRAM  1. Avoid overuse of voice or excessive use of the voice The vocal cords can become easily fatigued. Try to sort out what is "necessary" versus "unnecessary" talking in your environment. You do not need to STOP talking, but try to limit it as much as possible.  Think of resting your voice just as long as you talk. For example, if you talk for 5 minutes, rest your voice completely for 5 minutes. If your voice feels "tired" during the day, try to rest it as much as possible.  Avoid using an excessively loud voice or shouting/raising your voice When you yell or raise your voice, the vocal cords slam into each other, much like a strong hand clap. This causes irritation, and if this irritation continues, hoarseness may increase. If people in your home talk loudly, ask them to reduce the volume of their voices to help you decrease your volume as well. Sit near or face the person to whom you are speaking.   3. Avoid talking over background noise When talking in background noise, speech automatically has increased loudness, and as in number 2 above, continued loud speech can result in increased hoarseness due to irritation to the vocal cords.  Do not talk over the radio or the TV. Mute them before speaking, go to a quieter place to talk, or sit next to the person with whom you are watching.  4. Talk in a voice that is soft, smooth, and gentle This allows the vocal cords to come together in a gentle way and allows the air being exhaled from the lungs to do most/all of the work when you are speaking.  5. Avoid excessive throat clearing, coughing, and loud laughter During these activities the vocal cords can also be slammed together in a hard way and foster irritation or swelling, which can cause hoarseness. Try taking sips of room temperature/cool water with hard swallows to clear secretions from the throat, instead of throat clearing or coughing. If you absolutely  must clear your throat, do so as gently as possible. If you find yourself clearing your throat or coughing a lot, consult your physician. You will want to laugh. Continue to do so, but softly and gently. Do not laugh loudly for long periods of time because this can increase the chances for vocal fold irritation and thus hoarseness. Lozenges can help reduce the need to clear throat/cough during cold/allergy season.  6. Keep the mouth and throat lubricated Drink at least 8-10 8 oz. glasses of water per day (64-80 oz.). This water can come in the form of drink mixes.  Caffeinated beverages such as colas, coffee, and tea (hot tea AND sweet tea) dry out the vocal cords and then can cause irritation and thus hoarseness.  Drinking water will keep your body hydrated. This will help to decrease secretions in the throat, which cause people to clear their throats or cough. If you are exercising outside (especially during drier weather), try to breathe through your nose as much as possible. If this is not possible, lifting the tongue up behind the upper teeth when breathing through the mouth will add some moisture to the air breathed in past the vocal cords.  7. Avoid mouth breathing - breathe through your nose Your nose serves as a natural filter for dust and dirt particles from the air, and as a humidifier to moisten the air. Vocal cords like to work in moistened, filtered air. When you breathe through your mouth you lose  the air filtering and moistening benefits of breathing through the nose. Be aware of breathing patterns when sitting quietly (e.g., reading or watching TV). Increase your awareness and try to change habits from mouth breathing to nose breathing during those times.  8. Avoid environmental and/or ingested irritants Try to avoid smoke-filled and or dusty environments. These items dry out the vocal cords and cause irritation.  9. Use an air filter if the home is dusty, and/or a humidifier if the  air is dry  This will help to maintain clean, humid air to breathe. Remember, this type of air is what the vocal cords like best.

## 2023-05-25 NOTE — Therapy (Signed)
 OUTPATIENT SPEECH LANGUAGE PATHOLOGY VOICE TREATMENT   Patient Name: Alice [name] MRN: [id number] DOB:11/30/1985, 38 y.o., female Today's Date: 05/25/2023  PCP: [facility] REFERRING PROVIDER: Ashok Croon, MD  END OF SESSION:  End of Session - 05/25/23 0932     Visit Number 2    Number of Visits 17    Date for SLP Re-Evaluation 07/21/23    SLP Start Time 0933    SLP Stop Time  1015    SLP Time Calculation (min) 42 min    Activity Tolerance Patient tolerated treatment well              Past Medical History:  Diagnosis Date   Chronic left flank pain    Chronic pain    Congenital obstruction of ureteropelvic junction (UPJ)    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 [name] at [facility]   Morbid obesity [facility])    Preterm labor    Round ligament pain 09/10/2012   Torn ACL    Past Surgical History:  Procedure Laterality Date   addenoidectomy     CESAREAN SECTION N/A 02/22/2013   Procedure: CESAREAN SECTION/TWINS;  Surgeon: [name], MD;  Location: [facility];  Service: Obstetrics;  Laterality: N/A;   CHOLECYSTECTOMY     GALLBLADDER SURGERY  2019   NASAL SINUS SURGERY     NEPHRECTOMY     right side   OTHER SURGICAL HISTORY     ulner nerve removal   TONSILLECTOMY     Patient Active Problem List   Diagnosis Date Noted   Sepsis due to undetermined organism (HCC) 02/14/2022   Chronic abdominal wound infection 02/14/2022   Influenza B 02/14/2022   Acute respiratory failure with hypoxia (HCC) 02/14/2022   Hypothermia 02/14/2022   High anion gap metabolic acidosis 02/14/2022   Lactic acidosis 02/14/2022   Nausea & vomiting 02/14/2022   Diarrhea 02/14/2022   Hypokalemia 02/14/2022   Hyponatremia 02/14/2022   AKI (acute kidney injury) (HCC)  02/14/2022   Prolonged QT interval 02/14/2022   Hypoglycemia 02/14/2022   Metabolic acidosis 02/14/2022   Acute metabolic encephalopathy 02/14/2022   Multifocal pneumonia 02/13/2022   Migraine 12/12/2021   Panniculitis 12/12/2021   Bilateral pneumonia 12/12/2021   GAD (generalized anxiety disorder) 01/24/2019   GERD without esophagitis 07/13/2018   Intractable migraine without aura and without status migrainosus 07/13/2018   High risk medication use 07/13/2018   Calculus of gallbladder with acute and chronic cholecystitis with obstruction 06/19/2017   Ulnar neuropathy of left upper extremity 04/05/2017   Primary osteoarthritis of left knee 08/10/2015   Mood disorder (HCC) 12/30/2014   Smoker 05/13/2014   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   Single kidney 05/12/2014   Elevated liver function tests 09/24/2012   S/P left knee arthroscopy 07/23/2012   History of recurrent UTI (urinary tract infection) 07/23/2012   Obesity, Class III, BMI 40-49.9 (morbid obesity) (HCC) 07/23/2012    Onset date: "About a year ago"; script written 04/21/23  REFERRING DIAG: R49.0 (ICD-10-CM) - Dysphonia 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 Recurrent pneumonia, mild COPD, heartburn   PAIN:  Are you having pain? No  FALLS: Has patient fallen in last 6 months? Yes, Number of falls: 1 on a rollaway bed  PATIENT GOALS: "I would like to be able to sing, but at the least just get my normal voice back and not be hoarse every day."  OBJECTIVE:  Note: Objective measures were completed at Evaluation unless otherwise noted.  DIAGNOSTIC  FINDINGS:  MBS 03/01/22 Clinical Impression Patient demonstrates improvement with her swallowing - functional oral swallow with piecemealing noted.     However, she continues with mild pharyngeal dysphagia with ongoing silent aspiration of thin and nectar liquids (mixed with secretions) during the swallow. Impaired laryngeal closure results in spillage of liquid barium into anterior and posterior trach during the swallow.  Chin tuck posture effective to prevent aspiration of nectar but not thin liquid even with tsp amount.    Patient needed encouragement- repositioning in recliner to be fully upright - pillow placed behind her for optimal position.  Patient is stronger overall fortunately however she continues to aspirate without clearance despite cued cough.     Recommend advance to dys3/nectar and allow tsps. Thin water between meals after mouth care for QOL and to continue to tax system to swallow thinner consistencies.     Pt may benefit from ENT consult due to her ongoing dysphonia - and silent aspiration.  Using video monitor live and teach back post-test - pt educated to recommendations. Pt was made aware to suspicion of advancing to nectar only prior to exam due to ongoing overt coughing with thin consistencies - concerning for overt aspiration. She is agreeable to plan.     Tubed swallow precaution signs to floor for RN.    SLP Visit Diagnosis Dysphagia, pharyngeal phase (R13.13);Dysphagia, pharyngoesophageal phase (R13.14)  Impact on safety and function Mild aspiration risk             03/01/2022    4:13 PM  Treatment Recommendations  Treatment Recommendations Therapy as outlined in treatment plan below          03/01/2022    5:08 PM  Prognosis  Prognosis for Safe Diet Advancement Good  Barriers to Reach Goals Other (Comment)          03/01/2022    4:13 PM  Diet Recommendations  SLP Diet Recommendations Dysphagia 3 (Mech soft) solids;Nectar thick liquid;Other (Comment)   Liquid Administration via Straw  Medication Administration Crushed with puree  Postural Changes Seated upright at 90 degrees;Remain semi-upright after after feeds/meals (Comment)      ENT - [facility] 04/21/23 Assessment and Plan    Chronic Hoarseness for over a year Persistent hoarseness and inability to project  or sing since extubation following an intubation for hypoxic respiratory failure in January 2024. Examination revealed vocal fold nodules and mild edema likely due to phonotrauma and changes c/w GERD LPR, with no subglottic narrowing, other lesions or VF paralysis. Voice therapy and reflux control are recommended with repeat scope exam following full course of voice therapy. We discussed taking Doxy/Medrol Dose Pack for chronic laryngitis and voice rest x 48 hrs. She has young children and we discussed trying to avoid whispering or shouting.  - Refer to speech therapy for voice therapy. - Prescribe doxycycline and Medrol Dosepak. -  Advised voice rest for two days when taking Doxy/Medrol pack - Increase omeprazole 40 mg to twice daily. - Recommend Reflux Gourmet after meals and before bed to prevent reflux. - Plan follow-up in three months for reassessment and rescope.   PATIENT REPORTED OUTCOME MEASURES (PROM): V-RQOL:                                                                                                                              TREATMENT DATE:  Vocal Function Exercises=VFE  05/25/23: SLP introduced voice hygiene program for pt today. Pt is already focusing on reducing talk time by doing more texting, and reducing shouting/excessive loudness by snapping or clapping when she wants her children's attention.She will focus on decr'ing caffeine for next session. She will also look into getting an air purfier. SLP introduced vocal function exercises (VFE), exercise #1. She req'd min-mod A initially but was independent by session end. SLP to go over exercise #2a and b, and #3    05/17/23: n/a  PATIENT EDUCATION: Education details: see "treatment date" above for more details Person educated: Patient Education method: Explanation and Demonstration Education comprehension: returned demonstration and needs further education  HOME EXERCISE PROGRAM: Abdominal breathing and VFE, eventually.  GOALS: Goals reviewed with patient? Yes generally  SHORT TERM GOALS: Target date: 06/23/23 (due to first day tx)  Pt will complete VFE with rare min A in two sessions Baseline: Goal status: INITIAL  2.  Pt will incr water to 80oz and decr caffeine intake between 4 consecutive sessions Baseline:  Goal status: INITIAL  3.  Pt will use speech volume at </= average 71 dB 80% of the time in 7 minutes conversation in 2 sessions Baseline:  Goal status: INITIAL  4.  Pt will use AB 80% of the time in 7 minutes conversation in 2 sessions Baseline:  Goal status: INITIAL   LONG TERM GOALS: Target date: 07/21/23  Pt's PROM will improve compared to initial score Baseline:  Goal status: INITIAL  2.  Pt's s/z ratio will decr to be closer to 1.0 Baseline:  Goal status: INITIAL  3.   Pt will use speech volume at </= average 71 dB 80% of the time in 10 minutes conversation in 2 sessions Baseline:  Goal status: INITIAL  4.  Pt will tell SLP she has followed at least 2 aspects of vocal hygiene program between 8 sessions Baseline:  Goal status: INITIAL  5.  Pt will complete VFE with mod I in two sessions Baseline:  Goal status: INITIAL  6.  Pt will use AB 80% of the time in 10 minutes conversation in 2 sessions Baseline:  Goal status: INITIAL   ASSESSMENT:  CLINICAL IMPRESSION: Patient is a 38 y.o. F who was seen today for treatment of voice in light of intubation in January and presence of vocal nodules identified in ENT exam 04/21/22. Pt spoke in more of a WNL volume today.   OBJECTIVE IMPAIRMENTS: include  voice disorder. These impairments are limiting patient from  household responsibilities, ADLs/IADLs, and effectively communicating at home and in community. Factors affecting potential to achieve goals and functional outcome are  none noted .Marland Kitchen Patient will benefit from skilled SLP services to address above impairments and improve overall function.  REHAB POTENTIAL: Good  PLAN:  SLP FREQUENCY: 2x/week  SLP DURATION: 8 weeks  PLANNED INTERVENTIONS: Environmental controls, Internal/external aids, Functional tasks, SLP instruction and feedback, Compensatory strategies, Patient/family education, and 16[address] Treatment of speech (30 or 45 min) and voice exercises.    Margrett Kalb, CCC-SLP 05/25/2023, 9:33 AM   For all possible CPT codes, reference the Planned Interventions line above.     Check all conditions that are expected to impact treatment: {Conditions expected to impact treatment:None of these apply   If treatment provided at initial evaluation, no treatment charged due to lack of authorization.

## 2023-06-07 ENCOUNTER — Ambulatory Visit: Payer: MEDICAID

## 2023-06-07 DIAGNOSIS — R490 Dysphonia: Secondary | ICD-10-CM | POA: Diagnosis not present

## 2023-06-07 NOTE — Therapy (Signed)
 OUTPATIENT SPEECH LANGUAGE PATHOLOGY VOICE TREATMENT   Patient Name: Alice [name] MRN: [id number] DOB:Aug 21, 1985, 38 y.o., female Today's Date: 06/07/2023  PCP: [facility] REFERRING PROVIDER: Artice Last, MD  END OF SESSION:  End of Session - 06/07/23 0857     Visit Number 3    Number of Visits 17    Date for SLP Re-Evaluation 07/21/23    SLP Start Time 0853    SLP Stop Time  0930    SLP Time Calculation (min) 37 min    Activity Tolerance Patient tolerated treatment well              Past Medical History:  Diagnosis Date   Chronic left flank pain    Chronic pain    Congenital obstruction of ureteropelvic junction (UPJ)    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 Luster Salters at [facility]   Morbid obesity [facility])    Preterm labor    Round ligament pain 09/10/2012   Torn ACL    Past Surgical History:  Procedure Laterality Date   addenoidectomy     CESAREAN SECTION N/A 02/22/2013   Procedure: CESAREAN SECTION/TWINS;  Surgeon: Wendelyn Halter, MD;  Location: [facility];  Service: Obstetrics;  Laterality: N/A;   CHOLECYSTECTOMY     GALLBLADDER SURGERY  2019   NASAL SINUS SURGERY     NEPHRECTOMY     right side   OTHER SURGICAL HISTORY     ulner nerve removal   TONSILLECTOMY     Patient Active Problem List   Diagnosis Date Noted   Sepsis due to undetermined organism (HCC) 02/14/2022   Chronic abdominal wound infection 02/14/2022   Influenza B 02/14/2022   Acute respiratory failure with hypoxia (HCC) 02/14/2022   Hypothermia 02/14/2022   High anion gap metabolic acidosis 02/14/2022   Lactic acidosis 02/14/2022   Nausea & vomiting 02/14/2022   Diarrhea 02/14/2022   Hypokalemia 02/14/2022   Hyponatremia 02/14/2022   AKI (acute kidney injury) (HCC)  02/14/2022   Prolonged QT interval 02/14/2022   Hypoglycemia 02/14/2022   Metabolic acidosis 02/14/2022   Acute metabolic encephalopathy 02/14/2022   Multifocal pneumonia 02/13/2022   Migraine 12/12/2021   Panniculitis 12/12/2021   Bilateral pneumonia 12/12/2021   GAD (generalized anxiety disorder) 01/24/2019   GERD without esophagitis 07/13/2018   Intractable migraine without aura and without status migrainosus 07/13/2018   High risk medication use 07/13/2018   Calculus of gallbladder with acute and chronic cholecystitis with obstruction 06/19/2017   Ulnar neuropathy of left upper extremity 04/05/2017   Primary osteoarthritis of left knee 08/10/2015   Mood disorder (HCC) 12/30/2014   Smoker 05/13/2014   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   Single kidney 05/12/2014   Elevated liver function tests 09/24/2012   S/P left knee arthroscopy 07/23/2012   History of recurrent UTI (urinary tract infection) 07/23/2012   Obesity, Class III, BMI 40-49.9 (morbid obesity) (HCC) 07/23/2012    Onset date: "About a year ago"; script written 04/21/23  REFERRING DIAG: R49.0 (ICD-10-CM) - Dysphonia 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 Recurrent pneumonia, mild COPD, heartburn   PAIN:  Are you having pain? No  FALLS: Has patient fallen in last 6 months? Yes, Number of falls: 1 on a rollaway bed  PATIENT GOALS: "I would like to be able to sing, but at the least just get my normal voice back and not be hoarse every day."  OBJECTIVE:  Note: Objective measures were completed at Evaluation unless otherwise noted.  DIAGNOSTIC  FINDINGS:  MBS 03/01/22 Clinical Impression Patient demonstrates improvement with her swallowing - functional oral swallow with piecemealing noted.     However, she continues with mild pharyngeal dysphagia with ongoing silent aspiration of thin and nectar liquids (mixed with secretions) during the swallow. Impaired laryngeal closure results in spillage of liquid barium into anterior and posterior trach during the swallow.  Chin tuck posture effective to prevent aspiration of nectar but not thin liquid even with tsp amount.    Patient needed encouragement- repositioning in recliner to be fully upright - pillow placed behind her for optimal position.  Patient is stronger overall fortunately however she continues to aspirate without clearance despite cued cough.     Recommend advance to dys3/nectar and allow tsps. Thin water  between meals after mouth care for QOL and to continue to tax system to swallow thinner consistencies.     Pt may benefit from ENT consult due to her ongoing dysphonia - and silent aspiration.  Using video monitor live and teach back post-test - pt educated to recommendations. Pt was made aware to suspicion of advancing to nectar only prior to exam due to ongoing overt coughing with thin consistencies - concerning for overt aspiration. She is agreeable to plan.     Tubed swallow precaution signs to floor for RN.    SLP Visit Diagnosis Dysphagia, pharyngeal phase (R13.13);Dysphagia, pharyngoesophageal phase (R13.14)  Impact on safety and function Mild aspiration risk             03/01/2022    4:13 PM  Treatment Recommendations  Treatment Recommendations Therapy as outlined in treatment plan below          03/01/2022    5:08 PM  Prognosis  Prognosis for Safe Diet Advancement Good  Barriers to Reach Goals Other (Comment)          03/01/2022    4:13 PM  Diet Recommendations  SLP Diet Recommendations Dysphagia 3 (Mech soft) solids;Nectar thick liquid;Other (Comment)   Liquid Administration via Straw  Medication Administration Crushed with puree  Postural Changes Seated upright at 90 degrees;Remain semi-upright after after feeds/meals (Comment)      ENT - [facility] 04/21/23 Assessment and Plan    Chronic Hoarseness for over a year Persistent hoarseness and inability to project  or sing since extubation following an intubation for hypoxic respiratory failure in January 2024. Examination revealed vocal fold nodules and mild edema likely due to phonotrauma and changes c/w GERD LPR, with no subglottic narrowing, other lesions or VF paralysis. Voice therapy and reflux control are recommended with repeat scope exam following full course of voice therapy. We discussed taking Doxy/Medrol  Dose Pack for chronic laryngitis and voice rest x 48 hrs. She has young children and we discussed trying to avoid whispering or shouting.  - Refer to speech therapy for voice therapy. - Prescribe doxycycline  and Medrol  Dosepak. -  Advised voice rest for two days when taking Doxy/Medrol  pack - Increase omeprazole  40 mg to twice daily. - Recommend Reflux Gourmet after meals and before bed to prevent reflux. - Plan follow-up in three months for reassessment and rescope.   PATIENT REPORTED OUTCOME MEASURES (PROM): V-RQOL: will be provided 06/10/23                                                                                                                            TREATMENT DATE:  Vocal Function Exercises=VFE  06/07/23: Pt needs PROM next session. Entered today with speech volume 69dB and did not vary from this for entire session. SLP to target AB next session. SLP introduced AB with pt success at rest at 75%. She will practice this at home for 5-7 minutes/day-SLP suggested pt practice in supine. Pt has reduced caffeine  25%, has told kids to come to her to talk, is going to kids to talk if they are too far away to clap to get their attention, and has incr'd water  intake. She is  likely going to get a Office manager. SLP introduced remainder of VFE today with pt requiring mod cues consistently. She will cont these at home.  05/25/23: SLP introduced voice hygiene program for pt today. Pt is already focusing on reducing talk time by doing more texting, and reducing shouting/excessive loudness by snapping or clapping when she wants her children's attention.She will focus on decr'ing caffeine  for next session. She will also look into getting an air purfier. SLP introduced vocal function exercises (VFE), exercise #1. She req'd min-mod A initially but was independent by session end. SLP to go over exercise #2a and b, and #3   05/17/23: n/a  PATIENT EDUCATION: Education details: see "treatment date" above for more details Person educated: Patient Education method: Explanation and Demonstration Education comprehension: returned demonstration and needs further education  HOME EXERCISE PROGRAM: Abdominal breathing and VFE.  GOALS: Goals reviewed with patient? Yes generally  SHORT TERM GOALS: Target date: 06/23/23 (due to first day tx)  Pt will complete VFE with rare min A in two sessions Baseline: Goal status: INITIAL  2.  Pt will incr water  to 80oz and decr caffeine  intake between 4 consecutive sessions Baseline: 06/07/23 Goal status: INITIAL  3.  Pt will use speech volume at </= average 71 dB 80% of the time in 7 minutes conversation in 2 sessions Baseline: 06/07/23 Goal status: INITIAL  4.  Pt will use AB 80% of the time in 7 minutes conversation in 2 sessions Baseline:  Goal status: INITIAL   LONG TERM GOALS: Target date: 07/21/23  Pt's PROM will improve compared to initial score Baseline:  Goal status: INITIAL  2.  Pt's s/z ratio will decr to be closer to 1.0 Baseline:  Goal status: INITIAL  3.   Pt will use speech volume at </= average 71 dB 80% of the time in 10 minutes conversation in 2 sessions Baseline:  Goal status:  INITIAL  4.  Pt will tell  SLP she has followed at least 2 aspects of vocal hygiene program between 8 sessions Baseline:  Goal status: INITIAL  5.  Pt will complete VFE with mod I in two sessions Baseline:  Goal status: INITIAL  6.  Pt will use AB 80% of the time in 10 minutes conversation in 2 sessions Baseline:  Goal status: INITIAL   ASSESSMENT:  CLINICAL IMPRESSION: Patient is a 38 y.o. F who was seen today for treatment of voice in light of intubation in January and presence of vocal nodules identified in ENT exam 04/21/22. Pt spoke in more of a WNL volume today. See "treatment date" above for today's date for further details on today's session.    OBJECTIVE IMPAIRMENTS: include voice disorder. These impairments are limiting patient from household responsibilities, ADLs/IADLs, and effectively communicating at home and in community. Factors affecting potential to achieve goals and functional outcome are  none noted .Aaron Aas Patient will benefit from skilled SLP services to address above impairments and improve overall function.  REHAB POTENTIAL: Good  PLAN:  SLP FREQUENCY: 2x/week  SLP DURATION: 8 weeks  PLANNED INTERVENTIONS: Environmental controls, Internal/external aids, Functional tasks, SLP instruction and feedback, Compensatory strategies, Patient/family education, and [id number] Treatment of speech (30 or 45 min) and voice exercises.    Timofey Carandang, CCC-SLP 06/07/2023, 8:57 AM   For all possible CPT codes, reference the Planned Interventions line above.     Check all conditions that are expected to impact treatment: {Conditions expected to impact treatment:None of these apply   If treatment provided at initial evaluation, no treatment charged due to lack of authorization.

## 2023-06-09 ENCOUNTER — Ambulatory Visit: Payer: MEDICAID

## 2023-06-12 ENCOUNTER — Ambulatory Visit: Payer: MEDICAID

## 2023-06-12 DIAGNOSIS — R490 Dysphonia: Secondary | ICD-10-CM

## 2023-06-12 NOTE — Therapy (Signed)
 OUTPATIENT SPEECH LANGUAGE PATHOLOGY VOICE TREATMENT   Patient Name: Alice [name] MRN: [id number] DOB:09/26/1985, 38 y.o., female Today's Date: 06/12/2023  PCP: [facility] REFERRING PROVIDER: Artice Last, MD  END OF SESSION:  End of Session - 06/12/23 0937     Visit Number 4    Number of Visits 17    Date for SLP Re-Evaluation 07/21/23    SLP Start Time 0850    SLP Stop Time  0928    SLP Time Calculation (min) 38 min    Activity Tolerance Patient tolerated treatment well               Past Medical History:  Diagnosis Date   Chronic left flank pain    Chronic pain    Congenital obstruction of ureteropelvic junction (UPJ)    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 Luster Salters at [facility]   Morbid obesity [facility])    Preterm labor    Round ligament pain 09/10/2012   Torn ACL    Past Surgical History:  Procedure Laterality Date   addenoidectomy     CESAREAN SECTION N/A 02/22/2013   Procedure: CESAREAN SECTION/TWINS;  Surgeon: Wendelyn Halter, MD;  Location: [facility];  Service: Obstetrics;  Laterality: N/A;   CHOLECYSTECTOMY     GALLBLADDER SURGERY  2019   NASAL SINUS SURGERY     NEPHRECTOMY     right side   OTHER SURGICAL HISTORY     ulner nerve removal   TONSILLECTOMY     Patient Active Problem List   Diagnosis Date Noted   Sepsis due to undetermined organism (HCC) 02/14/2022   Chronic abdominal wound infection 02/14/2022   Influenza B 02/14/2022   Acute respiratory failure with hypoxia (HCC) 02/14/2022   Hypothermia 02/14/2022   High anion gap metabolic acidosis 02/14/2022   Lactic acidosis 02/14/2022   Nausea & vomiting 02/14/2022   Diarrhea 02/14/2022   Hypokalemia 02/14/2022   Hyponatremia 02/14/2022   AKI (acute kidney injury) (HCC)  02/14/2022   Prolonged QT interval 02/14/2022   Hypoglycemia 02/14/2022   Metabolic acidosis 02/14/2022   Acute metabolic encephalopathy 02/14/2022   Multifocal pneumonia 02/13/2022   Migraine 12/12/2021   Panniculitis 12/12/2021   Bilateral pneumonia 12/12/2021   GAD (generalized anxiety disorder) 01/24/2019   GERD without esophagitis 07/13/2018   Intractable migraine without aura and without status migrainosus 07/13/2018   High risk medication use 07/13/2018   Calculus of gallbladder with acute and chronic cholecystitis with obstruction 06/19/2017   Ulnar neuropathy of left upper extremity 04/05/2017   Primary osteoarthritis of left knee 08/10/2015   Mood disorder (HCC) 12/30/2014   Smoker 05/13/2014   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 The pollen is so  bad."  Pt accompanied by: self  PERTINENT HISTORY:   She was in a coma from February 14, 2023, to February 23, 2023, due to influenza B, pneumonia, and sepsis, requring intubation. Recurrent pneumonia, mild COPD, heartburn   PAIN:  Are you having pain? No  FALLS: Has patient fallen in last 6 months? Yes, Number of falls: 1 on a rollaway bed  PATIENT GOALS: "I would like to be able to sing, but at the least just get my normal voice back and not be hoarse every day."  OBJECTIVE:  Note: Objective measures were completed at Evaluation unless otherwise noted.  DIAGNOSTIC FINDINGS:   MBS 03/01/22 Clinical Impression Patient demonstrates improvement with her swallowing - functional oral swallow with piecemealing noted.     However, she continues with mild pharyngeal dysphagia with ongoing silent aspiration of thin and nectar liquids (mixed with secretions) during the swallow. Impaired laryngeal closure results in spillage of liquid barium into anterior and posterior trach during the swallow.  Chin tuck posture effective to prevent aspiration of nectar but not thin liquid even with tsp amount.    Patient needed encouragement- repositioning in recliner to be fully upright - pillow placed behind her for optimal position.  Patient is stronger overall fortunately however she continues to aspirate without clearance despite cued cough.     Recommend advance to dys3/nectar and allow tsps. Thin water  between meals after mouth care for QOL and to continue to tax system to swallow thinner consistencies.     Pt may benefit from ENT consult due to her ongoing dysphonia - and silent aspiration.  Using video monitor live and teach back post-test - pt educated to recommendations. Pt was made aware to suspicion of advancing to nectar only prior to exam due to ongoing overt coughing with thin consistencies - concerning for overt aspiration. She is agreeable to plan.     Tubed swallow precaution signs to floor for RN.    SLP Visit Diagnosis Dysphagia, pharyngeal phase (R13.13);Dysphagia, pharyngoesophageal phase (R13.14)  Impact on safety and function Mild aspiration risk             03/01/2022    4:13 PM  Treatment Recommendations  Treatment Recommendations Therapy as outlined in treatment plan below          03/01/2022    5:08 PM  Prognosis  Prognosis for Safe Diet Advancement Good  Barriers to Reach Goals Other (Comment)          03/01/2022    4:13 PM  Diet Recommendations  SLP Diet Recommendations Dysphagia 3 (Mech soft) solids;Nectar thick liquid;Other (Comment)  Liquid  Administration via Straw  Medication Administration Crushed with puree  Postural Changes Seated upright at 90 degrees;Remain semi-upright after after feeds/meals (Comment)      ENT - [place] 04/21/23 Assessment and Plan    Chronic Hoarseness for over a year Persistent hoarseness and inability to project  or sing since extubation following an intubation for hypoxic respiratory failure in January 2024. Examination revealed vocal fold nodules and mild edema likely due to phonotrauma and changes c/w GERD LPR, with no subglottic narrowing, other lesions or VF paralysis. Voice therapy and reflux control are recommended with repeat scope exam following full course of voice therapy. We discussed taking Doxy/Medrol  Dose Pack for chronic laryngitis and voice rest x 48 hrs. She has young children and we discussed trying to avoid whispering or shouting.  - Refer to speech therapy for voice therapy. - Prescribe doxycycline  and Medrol  Dosepak. -  Advised voice rest for two days when taking Doxy/Medrol  pack - Increase omeprazole  40 mg to twice daily. - Recommend Reflux Gourmet after meals and before bed to prevent reflux. - Plan follow-up in three months for reassessment and rescope.   PATIENT REPORTED OUTCOME MEASURES (PROM): V-RQOL: returned 06/12/23 with raw score of 40 and V-RQOL score of 60 = fair. Pt also rated her voice quality "fair" in the last two weeks.                                                                                                                            TREATMENT DATE:  Vocal Function Exercises=VFE  06/12/23: Pt without pain since last exam. Woke Thursday with "scratchy" feeling and hoarse, with allergy sx (sneezing, coughing, post-nasal drip), voice was worse on Friday. Voice has progressively has gotten better  She took Flonase  (additional allergy med) Thursday, Friday and AM on Saturday. Since Thursday pt only used voice when absolutely necessary. SLP educated pt that on  these days she should also limit caffeine , and increase water  intake. SLP assisted pt with VFE today and pt req'd cues for more accurate starting and stopping of stopwatch. Not always on pitch for exercise 3 but sufficient.   06/07/23: Pt needs PROM next session. Entered today with speech volume 69dB and did not vary from this for entire session. SLP to target AB next session. SLP introduced AB with pt success at rest at 75%. She will practice this at home for 5-7 minutes/day-SLP suggested pt practice in supine. Pt has reduced caffeine  25%, has told kids to come to her to talk, is going to kids to talk if they are too far away to clap to get their attention, and has incr'd water  intake. She is likely going to get a Office manager. SLP introduced remainder of VFE today with pt requiring mod cues consistently. She will cont these at home.  05/25/23: SLP introduced voice hygiene program for pt today. Pt is already focusing on reducing talk time by doing more texting, and reducing shouting/excessive loudness by snapping or clapping when she wants her children's attention.She will focus on decr'ing caffeine  for next session. She will also look into getting an air purfier. SLP introduced vocal function exercises (VFE), exercise #1. She req'd min-mod A initially but was independent by session end. SLP to go over exercise #2a and b, and #3   05/17/23: n/a  PATIENT EDUCATION: Education details: see "treatment date" above for more details Person educated: Patient Education method: Explanation and Demonstration Education comprehension: returned demonstration and needs further education  HOME EXERCISE PROGRAM: Abdominal breathing and VFE.  GOALS: Goals reviewed with patient? Yes generally  SHORT TERM GOALS: Target date: 06/23/23 (due to first day tx)  Pt will complete VFE with rare min A in two sessions Baseline: Goal status: INITIAL  2.  Pt will incr water  to 80oz and decr caffeine  intake between 4  consecutive sessions Baseline: 06/07/23 Goal status:  INITIAL  3.  Pt will use speech volume at </= average 71 dB 80% of the time in 7 minutes conversation in 2 sessions Baseline: 06/07/23 Goal status: INITIAL  4.  Pt will use AB 80% of the time in 7 minutes conversation in 2 sessions Baseline:  Goal status: INITIAL   LONG TERM GOALS: Target date: 07/21/23  Pt's PROM will improve compared to initial score Baseline:  Goal status: INITIAL  2.  Pt's s/z ratio will decr to be closer to 1.0 Baseline:  Goal status: INITIAL  3.   Pt will use speech volume at </= average 71 dB 80% of the time in 10 minutes conversation in 2 sessions Baseline:  Goal status: INITIAL  4.  Pt will tell SLP she has followed at least 2 aspects of vocal hygiene program between 8 sessions Baseline:  Goal status: INITIAL  5.  Pt will complete VFE with mod I in two sessions Baseline:  Goal status: INITIAL  6.  Pt will use AB 80% of the time in 10 minutes conversation in 2 sessions Baseline:  Goal status: INITIAL   ASSESSMENT:  CLINICAL IMPRESSION: Patient is a 38 y.o. F who was seen today for treatment of voice in light of intubation in January and presence of vocal nodules identified in ENT exam 04/21/22. Pt spoke in more of a WNL volume today. See "treatment date" above for today's date for further details on today's session.    OBJECTIVE IMPAIRMENTS: include voice disorder. These impairments are limiting patient from household responsibilities, ADLs/IADLs, and effectively communicating at home and in community. Factors affecting potential to achieve goals and functional outcome are  none noted .Aaron Aas Patient will benefit from skilled SLP services to address above impairments and improve overall function.  REHAB POTENTIAL: Good  PLAN:  SLP FREQUENCY: 2x/week  SLP DURATION: 8 weeks  PLANNED INTERVENTIONS: Environmental controls, Internal/external aids, Functional tasks, SLP instruction and feedback,  Compensatory strategies, Patient/family education, and [address] Treatment of speech (30 or 45 min) and voice exercises.    Surena Welge, CCC-SLP 06/12/2023, 9:38 AM   For all possible CPT codes, reference the Planned Interventions line above.     Check all conditions that are expected to impact treatment: {Conditions expected to impact treatment:None of these apply   If treatment provided at initial evaluation, no treatment charged due to lack of authorization.

## 2023-06-14 ENCOUNTER — Ambulatory Visit: Payer: MEDICAID

## 2023-06-14 DIAGNOSIS — R490 Dysphonia: Secondary | ICD-10-CM | POA: Diagnosis not present

## 2023-06-14 NOTE — Therapy (Signed)
 OUTPATIENT SPEECH LANGUAGE PATHOLOGY VOICE TREATMENT   Patient Name: Alice [name] MRN: [id number] DOB:[birth year], 38 y.o., female Today's Date: 06/14/2023  PCP: [facility] REFERRING PROVIDER: Artice Last, MD  END OF SESSION:  End of Session - 06/14/23 0939     Visit Number 5    Number of Visits 17    Date for SLP Re-Evaluation 07/21/23    SLP Start Time 0935    SLP Stop Time  1015    SLP Time Calculation (min) 40 min    Activity Tolerance Patient tolerated treatment well               Past Medical History:  Diagnosis Date   Chronic left flank pain    Chronic pain    Congenital obstruction of ureteropelvic junction (UPJ)    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 Luster Salters at [facility]   Morbid obesity [facility])    Preterm labor    Round ligament pain 09/10/2012   Torn ACL    Past Surgical History:  Procedure Laterality Date   addenoidectomy     CESAREAN SECTION N/A 02/22/2013   Procedure: CESAREAN SECTION/TWINS;  Surgeon: Wendelyn Halter, MD;  Location: [facility];  Service: Obstetrics;  Laterality: N/A;   CHOLECYSTECTOMY     GALLBLADDER SURGERY  2019   NASAL SINUS SURGERY     NEPHRECTOMY     right side   OTHER SURGICAL HISTORY     ulner nerve removal   TONSILLECTOMY     Patient Active Problem List   Diagnosis Date Noted   Sepsis due to undetermined organism (HCC) 02/14/2022   Chronic abdominal wound infection 02/14/2022   Influenza B 02/14/2022   Acute respiratory failure with hypoxia (HCC) 02/14/2022   Hypothermia 02/14/2022   High anion gap metabolic acidosis 02/14/2022   Lactic acidosis 02/14/2022   Nausea & vomiting 02/14/2022   Diarrhea 02/14/2022   Hypokalemia 02/14/2022   Hyponatremia 02/14/2022   AKI (acute kidney injury) (HCC)  02/14/2022   Prolonged QT interval 02/14/2022   Hypoglycemia 02/14/2022   Metabolic acidosis 02/14/2022   Acute metabolic encephalopathy 02/14/2022   Multifocal pneumonia 02/13/2022   Migraine 12/12/2021   Panniculitis 12/12/2021   Bilateral pneumonia 12/12/2021   GAD (generalized anxiety disorder) 01/24/2019   GERD without esophagitis 07/13/2018   Intractable migraine without aura and without status migrainosus 07/13/2018   High risk medication use 07/13/2018   Calculus of gallbladder with acute and chronic cholecystitis with obstruction 06/19/2017   Ulnar neuropathy of left upper extremity 04/05/2017   Primary osteoarthritis of left knee 08/10/2015   Mood disorder (HCC) 12/30/2014   Smoker 05/13/2014   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   Single kidney 05/12/2014   Elevated liver function tests 09/24/2012   S/P left knee arthroscopy 07/23/2012   History of recurrent UTI (urinary tract infection) 07/23/2012   Obesity, Class III, BMI 40-49.9 (morbid obesity) (HCC) 07/23/2012    Onset date: "About a year ago"; script written 04/21/23  REFERRING DIAG: R49.0 (ICD-10-CM) - Dysphonia 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 Recurrent pneumonia, mild COPD, heartburn   PAIN:  Are you having pain? No  FALLS: Has patient fallen in last 6 months? Yes, Number of falls: 1 on a rollaway bed  PATIENT GOALS: "I would like to be able to sing, but at the least just get my normal voice back and not be hoarse every day."  OBJECTIVE:  Note: Objective measures were completed at Evaluation unless otherwise noted.  DIAGNOSTIC FINDINGS:  MBS 03/01/22 Clinical  Impression Patient demonstrates improvement with her swallowing - functional oral swallow with piecemealing noted.     However, she continues with mild pharyngeal dysphagia with ongoing silent aspiration of thin and nectar liquids (mixed with secretions) during the swallow. Impaired laryngeal closure results in spillage of liquid barium into anterior and posterior trach during the swallow.  Chin tuck posture effective to prevent aspiration of nectar but not thin liquid even with tsp amount.    Patient needed encouragement- repositioning in recliner to be fully upright - pillow placed behind her for optimal position.  Patient is stronger overall fortunately however she continues to aspirate without clearance despite cued cough.     Recommend advance to dys3/nectar and allow tsps. Thin water  between meals after mouth care for QOL and to continue to tax system to swallow thinner consistencies.     Pt may benefit from ENT consult due to her ongoing dysphonia - and silent aspiration.  Using video monitor live and teach back post-test - pt educated to recommendations. Pt was made aware to suspicion of advancing to nectar only prior to exam due to ongoing overt coughing with thin consistencies - concerning for overt aspiration. She is agreeable to plan.     Tubed swallow precaution signs to floor for RN.    SLP Visit Diagnosis Dysphagia, pharyngeal phase (R13.13);Dysphagia, pharyngoesophageal phase (R13.14)  Impact on safety and function Mild aspiration risk             03/01/2022    4:13 PM  Treatment Recommendations  Treatment Recommendations Therapy as outlined in treatment plan below          03/01/2022    5:08 PM  Prognosis  Prognosis for Safe Diet Advancement Good  Barriers to Reach Goals Other (Comment)          03/01/2022    4:13 PM  Diet Recommendations  SLP Diet Recommendations Dysphagia 3 (Mech soft) solids;Nectar thick liquid;Other (Comment)  Liquid Administration via Straw   Medication Administration Crushed with puree  Postural Changes Seated upright at 90 degrees;Remain semi-upright after after feeds/meals (Comment)      ENT - [place] 04/21/23 Assessment and Plan    Chronic Hoarseness for over a year Persistent hoarseness and inability to project  or sing since extubation following an intubation for hypoxic respiratory failure in January 2024. Examination revealed vocal fold nodules and mild edema likely due to phonotrauma and changes c/w GERD LPR, with no subglottic narrowing, other lesions or VF paralysis. Voice therapy and reflux control are recommended with repeat scope exam following full course of voice therapy. We discussed taking Doxy/Medrol  Dose Pack for chronic laryngitis and voice rest x 48 hrs. She has young children and we discussed trying to avoid whispering or shouting.  - Refer to speech therapy for voice therapy. - Prescribe doxycycline  and Medrol  Dosepak. - Advised voice rest for two  days when taking Doxy/Medrol  pack - Increase omeprazole  40 mg to twice daily. - Recommend Reflux Gourmet after meals and before bed to prevent reflux. - Plan follow-up in three months for reassessment and rescope.   PATIENT REPORTED OUTCOME MEASURES (PROM): V-RQOL: returned 06/12/23 with raw score of 40 and V-RQOL score of 60 = fair. Pt also rated her voice quality "fair" in the last two weeks.                                                                                                                            TREATMENT DATE:  Vocal Function Exercises=VFE  06/14/23: "My voice is much better today. I didn't really talk at all Monday or Tuesday. Pt used Flonase  since last session "just to be on the safe side - I'm still a bit raspy." SLP introduced AB - pt 100% at rest, with min A. Moved up and had pt respond with yes/no with AB - Shorter breath cycle noted but using AB. Homework for AB at rest (10-15 minutes BID) and AB with yes/no or single word responses  using AB (5 minutes every day).  06/12/23: Pt without pain since last exam. Woke Thursday with "scratchy" feeling and hoarse, with allergy sx (sneezing, coughing, post-nasal drip), voice was worse on Friday. Voice has progressively has gotten better  She took Flonase  (additional allergy med) Thursday, Friday and AM on Saturday. Since Thursday pt only used voice when absolutely necessary. SLP educated pt that on these days she should also limit caffeine , and increase water  intake. SLP assisted pt with VFE today and pt req'd cues for more accurate starting and stopping of stopwatch. Not always on pitch for exercise 3 but sufficient.   06/07/23: Pt needs PROM next session. Entered today with speech volume 69dB and did not vary from this for entire session. SLP to target AB next session. SLP introduced AB with pt success at rest at 75%. She will practice this at home for 5-7 minutes/day-SLP suggested pt practice in supine. Pt has reduced caffeine  25%, has told kids to come to her to talk, is going to kids to talk if they are too far away to clap to get their attention, and has incr'd water  intake. She is likely going to get a Office manager. SLP introduced remainder of VFE today with pt requiring mod cues consistently. She will cont these at home.  05/25/23: SLP introduced voice hygiene program for pt today. Pt is already focusing on reducing talk time by doing more texting, and reducing shouting/excessive loudness by snapping or clapping when she wants her children's attention.She will focus on decr'ing caffeine  for next session. She will also look into getting an air purfier. SLP introduced vocal function exercises (VFE), exercise #1. She req'd min-mod A initially but was independent by session end. SLP to go over exercise #2a and b, and #3   05/17/23: n/a  PATIENT EDUCATION: Education details: see "treatment date" above for more details  Person educated: Patient Education method: Software engineer Education comprehension: returned demonstration and needs further education  HOME EXERCISE PROGRAM: Abdominal breathing and VFE.  GOALS: Goals reviewed with patient? Yes generally  SHORT TERM GOALS: Target date: 06/23/23 (due to first day tx)  Pt will complete VFE with rare min A in two sessions Baseline: Goal status: INITIAL  2.  Pt will incr water  to 80oz and decr caffeine  intake between 4 consecutive sessions Baseline: 06/07/23 Goal status: INITIAL  3.  Pt will use speech volume at </= average 71 dB 80% of the time in 7 minutes conversation in 2 sessions Baseline: 06/07/23 Goal status: INITIAL  4.  Pt will use AB 80% of the time in 7 minutes conversation in 2 sessions Baseline:  Goal status: INITIAL   LONG TERM GOALS: Target date: 07/21/23  Pt's PROM will improve compared to initial score Baseline:  Goal status: INITIAL  2.  Pt's s/z ratio will decr to be closer to 1.0 Baseline:  Goal status: INITIAL  3.   Pt will use speech volume at </= average 71 dB 80% of the time in 10 minutes conversation in 2 sessions Baseline:  Goal status: INITIAL  4.  Pt will tell SLP she has followed at least 2 aspects of vocal hygiene program between 8 sessions Baseline:  Goal status: INITIAL  5.  Pt will complete VFE with mod I in two sessions Baseline:  Goal status: INITIAL  6.  Pt will use AB 80% of the time in 10 minutes conversation in 2 sessions Baseline:  Goal status: INITIAL   ASSESSMENT:  CLINICAL IMPRESSION: Patient is a 37 y.o. F who was seen today for treatment of voice in light of intubation in January and presence of vocal nodules identified in ENT exam 04/21/22. Pt spoke in more of a WNL volume today. See "treatment date" above for today's date for further details on today's session.    OBJECTIVE IMPAIRMENTS: include voice disorder. These impairments are limiting patient from household responsibilities, ADLs/IADLs, and effectively communicating at home  and in community. Factors affecting potential to achieve goals and functional outcome are  none noted .Alice [name] Patient will benefit from skilled SLP services to address above impairments and improve overall function.  REHAB POTENTIAL: Good  PLAN:  SLP FREQUENCY: 2x/week  SLP DURATION: 8 weeks  PLANNED INTERVENTIONS: Environmental controls, Internal/external aids, Functional tasks, SLP instruction and feedback, Compensatory strategies, Patient/family education, and [id number] Treatment of speech (30 or 45 min) and voice exercises.    Alice [name], CCC-SLP 06/14/2023, 9:39 AM   For all possible CPT codes, reference the Planned Interventions line above.     Check all conditions that are expected to impact treatment: {Conditions expected to impact treatment:None of these apply   If treatment provided at initial evaluation, no treatment charged due to lack of authorization.

## 2023-06-19 ENCOUNTER — Ambulatory Visit: Payer: MEDICAID

## 2023-06-21 ENCOUNTER — Ambulatory Visit: Payer: MEDICAID

## 2023-06-26 ENCOUNTER — Ambulatory Visit: Payer: MEDICAID | Admitting: Speech Pathology

## 2023-06-26 NOTE — Therapy (Deleted)
 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 Luster Salters at [facility]   Morbid obesity [facility])    Preterm labor    Round ligament pain 09/10/2012   Torn ACL    Past Surgical History:  Procedure Laterality Date   addenoidectomy     CESAREAN SECTION N/A 02/22/2013   Procedure: CESAREAN SECTION/TWINS;  Surgeon: Wendelyn Halter, MD;  Location: [facility];  Service: Obstetrics;  Laterality: N/A;   CHOLECYSTECTOMY     GALLBLADDER SURGERY  2019   NASAL SINUS SURGERY     NEPHRECTOMY     right side   OTHER SURGICAL HISTORY     ulner nerve removal   TONSILLECTOMY     Patient Active Problem List   Diagnosis Date Noted   Sepsis due to undetermined organism (HCC) 02/14/2022   Chronic abdominal wound infection 02/14/2022   Influenza B 02/14/2022   Acute respiratory failure with hypoxia (HCC) 02/14/2022   Hypothermia 02/14/2022   High anion gap metabolic acidosis 02/14/2022   Lactic acidosis 02/14/2022   Nausea & vomiting 02/14/2022   Diarrhea 02/14/2022   Hypokalemia 02/14/2022   Hyponatremia 02/14/2022   AKI (acute kidney injury) (HCC) 02/14/2022   Prolonged QT interval 02/14/2022   Hypoglycemia 02/14/2022   Metabolic acidosis 02/14/2022   Acute metabolic encephalopathy 02/14/2022   Multifocal pneumonia 02/13/2022   Migraine 12/12/2021   Panniculitis 12/12/2021   Bilateral pneumonia 12/12/2021    GAD (generalized anxiety disorder) 01/24/2019   GERD without esophagitis 07/13/2018   Intractable migraine without aura and without status migrainosus 07/13/2018   High risk medication use 07/13/2018   Calculus of gallbladder with acute and chronic cholecystitis with obstruction 06/19/2017   Ulnar neuropathy of left upper extremity 04/05/2017   Primary osteoarthritis of left knee 08/10/2015   Mood disorder (HCC) 12/30/2014   Smoker 05/13/2014   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  Rationale for Evaluation and Treatment: Rehabilitation  SUBJECTIVE:   SUBJECTIVE STATEMENT: ***  Pt accompanied by: self  PERTINENT HISTORY:   She was in a coma from February 14, 2023, to February 23, 2023, due to influenza B, pneumonia, and sepsis, requring intubation. Recurrent pneumonia, mild COPD, heartburn   PAIN:  Are you having pain? No  FALLS: Has patient fallen in last 6 months? Yes, Number of falls: 1 on a rollaway bed  PATIENT GOALS: "I would like to be able to sing, but at the least  just get my normal voice back and not be hoarse every day."  OBJECTIVE:  Note: Objective measures were completed at Evaluation unless otherwise noted.  DIAGNOSTIC FINDINGS:  MBS 03/01/22 Clinical Impression Patient demonstrates improvement with her swallowing - functional oral swallow with piecemealing noted.     However, she continues with mild pharyngeal dysphagia with ongoing silent aspiration of thin and nectar liquids (mixed with secretions) during the swallow. Impaired laryngeal closure  results in spillage of liquid barium into anterior and posterior trach during the swallow.  Chin tuck posture effective to prevent aspiration of nectar but not thin liquid even with tsp amount.    Patient needed encouragement- repositioning in recliner to be fully upright - pillow placed behind her for optimal position.  Patient is stronger overall fortunately however she continues to aspirate without clearance despite cued cough.     Recommend advance to dys3/nectar and allow tsps. Thin water  between meals after mouth care for QOL and to continue to tax system to swallow thinner consistencies.     Pt may benefit from ENT consult due to her ongoing dysphonia - and silent aspiration.  Using video monitor live and teach back post-test - pt educated to recommendations. Pt was made aware to suspicion of advancing to nectar only prior to exam due to ongoing overt coughing with thin consistencies - concerning for overt aspiration. She is agreeable to plan.     Tubed swallow precaution signs to floor for RN.    SLP Visit Diagnosis Dysphagia, pharyngeal phase (R13.13);Dysphagia, pharyngoesophageal phase (R13.14)  Impact on safety and function Mild aspiration risk             03/01/2022    4:13 PM  Treatment Recommendations  Treatment Recommendations Therapy as outlined in treatment plan below          03/01/2022    5:08 PM  Prognosis  Prognosis for Safe Diet Advancement Good  Barriers to Reach Goals Other (Comment)          03/01/2022    4:13 PM  Diet Recommendations  SLP Diet Recommendations Dysphagia 3 (Mech soft) solids;Nectar thick liquid;Other (Comment)  Liquid Administration via Straw  Medication Administration Crushed with puree  Postural Changes Seated upright at 90 degrees;Remain semi-upright after after feeds/meals (Comment)      ENT - [place] 04/21/23 Assessment and Plan    Chronic Hoarseness for over a year Persistent hoarseness and inability to project  or sing since  extubation following an intubation for hypoxic respiratory failure in January 2024. Examination revealed vocal fold nodules and mild edema likely due to phonotrauma and changes c/w GERD LPR, with no subglottic narrowing, other lesions or VF paralysis. Voice therapy and reflux control are recommended with repeat scope exam following full course of voice therapy. We discussed taking Doxy/Medrol  Dose Pack for chronic laryngitis and voice rest x 48 hrs. She has young children and we discussed trying to avoid whispering or shouting.  - Refer to speech therapy for voice therapy. - Prescribe doxycycline  and Medrol  Dosepak. - Advised voice rest for two days when taking Doxy/Medrol  pack - Increase omeprazole  40 mg to twice daily. - Recommend Reflux Gourmet after meals and before bed to prevent reflux. - Plan follow-up in three months for reassessment and rescope.   PATIENT REPORTED OUTCOME MEASURES (PROM): V-RQOL: returned 06/12/23 with raw score of 40 and V-RQOL score of 60 = fair. Pt also rated her voice quality "fair" in the last two weeks.  TREATMENT DATE:  Vocal Function Exercises=VFE  06/26/23: ***  06/14/23: "My voice is much better today. I didn't really talk at all Monday or Tuesday. Pt used Flonase  since last session "just to be on the safe side - I'm still a bit raspy." SLP introduced AB - pt 100% at rest, with min A. Moved up and had pt respond with yes/no with AB - Shorter breath cycle noted but using AB. Homework for AB at rest (10-15 minutes BID) and AB with yes/no or single word responses using AB (5 minutes every day).  06/12/23: Pt without pain since last exam. Woke Thursday with "scratchy" feeling and hoarse, with allergy sx (sneezing, coughing, post-nasal drip), voice was worse on Friday. Voice has progressively has gotten better  She took Flonase  (additional allergy  med) Thursday, Friday and AM on Saturday. Since Thursday pt only used voice when absolutely necessary. SLP educated pt that on these days she should also limit caffeine , and increase water  intake. SLP assisted pt with VFE today and pt req'd cues for more accurate starting and stopping of stopwatch. Not always on pitch for exercise 3 but sufficient.   06/07/23: Pt needs PROM next session. Entered today with speech volume 69dB and did not vary from this for entire session. SLP to target AB next session. SLP introduced AB with pt success at rest at 75%. She will practice this at home for 5-7 minutes/day-SLP suggested pt practice in supine. Pt has reduced caffeine  25%, has told kids to come to her to talk, is going to kids to talk if they are too far away to clap to get their attention, and has incr'd water  intake. She is likely going to get a Office manager. SLP introduced remainder of VFE today with pt requiring mod cues consistently. She will cont these at home.  05/25/23: SLP introduced voice hygiene program for pt today. Pt is already focusing on reducing talk time by doing more texting, and reducing shouting/excessive loudness by snapping or clapping when she wants her children's attention.She will focus on decr'ing caffeine  for next session. She will also look into getting an air purfier. SLP introduced vocal function exercises (VFE), exercise #1. She req'd min-mod A initially but was independent by session end. SLP to go over exercise #2a and b, and #3   05/17/23: n/a  PATIENT EDUCATION: Education details: see "treatment date" above for more details Person educated: Patient Education method: Explanation and Demonstration Education comprehension: returned demonstration and needs further education  HOME EXERCISE PROGRAM: Abdominal breathing and VFE.  GOALS: Goals reviewed with patient? Yes generally  SHORT TERM GOALS: Target date: 06/23/23 (due to first day tx)  Pt will complete VFE with rare  min A in two sessions Baseline: Goal status: INITIAL  2.  Pt will incr water  to 80oz and decr caffeine  intake between 4 consecutive sessions Baseline: 06/07/23 Goal status: INITIAL  3.  Pt will use speech volume at </= average 71 dB 80% of the time in 7 minutes conversation in 2 sessions Baseline: 06/07/23 Goal status: INITIAL  4.  Pt will use AB 80% of the time in 7 minutes conversation in 2 sessions Baseline:  Goal status: INITIAL   LONG TERM GOALS: Target date: 07/21/23  Pt's PROM will improve compared to initial score Baseline:  Goal status: INITIAL  2.  Pt's s/z ratio will decr to be closer to 1.0 Baseline:  Goal status: INITIAL  3.   Pt will use speech volume at </= average 71 dB  80% of the time in 10 minutes conversation in 2 sessions Baseline:  Goal status: INITIAL  4.  Pt will tell SLP she has followed at least 2 aspects of vocal hygiene program between 8 sessions Baseline:  Goal status: INITIAL  5.  Pt will complete VFE with mod I in two sessions Baseline:  Goal status: INITIAL  6.  Pt will use AB 80% of the time in 10 minutes conversation in 2 sessions Baseline:  Goal status: INITIAL   ASSESSMENT:  CLINICAL IMPRESSION: Patient is a 38 y.o. F who was seen today for treatment of voice in light of intubation in January and presence of vocal nodules identified in ENT exam 04/21/22. Pt spoke in more of a WNL volume today. See "treatment date" above for today's date for further details on today's session.    OBJECTIVE IMPAIRMENTS: include voice disorder. These impairments are limiting patient from household responsibilities, ADLs/IADLs, and effectively communicating at home and in community. Factors affecting potential to achieve goals and functional outcome are none noted.. Patient will benefit from skilled SLP services to address above impairments and improve overall function.  REHAB POTENTIAL: Good  PLAN:  SLP FREQUENCY: 2x/week  SLP DURATION: 8  weeks  PLANNED INTERVENTIONS: Environmental controls, Internal/external aids, Functional tasks, SLP instruction and feedback, Compensatory strategies, Patient/family education, and [id number] Treatment of speech (30 or 45 min) and voice exercises.    Alston Jerry, CCC-SLP 06/26/2023, 7:56 AM   For all possible CPT codes, reference the Planned Interventions line above.     Check all conditions that are expected to impact treatment: {Conditions expected to impact treatment:None of these apply   If treatment provided at initial evaluation, no treatment charged due to lack of authorization.

## 2023-06-28 ENCOUNTER — Ambulatory Visit: Payer: MEDICAID

## 2023-07-03 ENCOUNTER — Ambulatory Visit: Payer: MEDICAID

## 2023-07-05 ENCOUNTER — Ambulatory Visit: Payer: MEDICAID | Attending: Otolaryngology

## 2023-07-05 DIAGNOSIS — R490 Dysphonia: Secondary | ICD-10-CM | POA: Diagnosis present

## 2023-07-05 NOTE — Therapy (Signed)
 OUTPATIENT SPEECH LANGUAGE PATHOLOGY VOICE TREATMENT   Patient Name: Alice [name] MRN: [id number] DOB:August 06, 1985, 38 y.o., female Today's Date: 07/05/2023  PCP: [facility] REFERRING PROVIDER: Artice Last, MD  END OF SESSION:  End of Session - 07/05/23 0935     Visit Number 6    Number of Visits 17    Date for SLP Re-Evaluation 07/21/23    SLP Start Time 0935    SLP Stop Time  1015    SLP Time Calculation (min) 40 min    Activity Tolerance Patient tolerated treatment well                Past Medical History:  Diagnosis Date   Chronic left flank pain    Chronic pain    Congenital obstruction of ureteropelvic junction (UPJ)    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 Luster Salters at [facility]   Morbid obesity [facility])    Preterm labor    Round ligament pain 09/10/2012   Torn ACL    Past Surgical History:  Procedure Laterality Date   addenoidectomy     CESAREAN SECTION N/A 02/22/2013   Procedure: CESAREAN SECTION/TWINS;  Surgeon: Wendelyn Halter, MD;  Location: [facility];  Service: Obstetrics;  Laterality: N/A;   CHOLECYSTECTOMY     GALLBLADDER SURGERY  2019   NASAL SINUS SURGERY     NEPHRECTOMY     right side   OTHER SURGICAL HISTORY     ulner nerve removal   TONSILLECTOMY     Patient Active Problem List   Diagnosis Date Noted   Sepsis due to undetermined organism (HCC) 02/14/2022   Chronic abdominal wound infection 02/14/2022   Influenza B 02/14/2022   Acute respiratory failure with hypoxia (HCC) 02/14/2022   Hypothermia 02/14/2022   High anion gap metabolic acidosis 02/14/2022   Lactic acidosis 02/14/2022   Nausea & vomiting 02/14/2022   Diarrhea 02/14/2022   Hypokalemia 02/14/2022   Hyponatremia 02/14/2022   AKI (acute kidney injury)  (HCC) 02/14/2022   Prolonged QT interval 02/14/2022   Hypoglycemia 02/14/2022   Metabolic acidosis 02/14/2022   Acute metabolic encephalopathy 02/14/2022   Multifocal pneumonia 02/13/2022   Migraine 12/12/2021   Panniculitis 12/12/2021   Bilateral pneumonia 12/12/2021   GAD (generalized anxiety disorder) 01/24/2019   GERD without esophagitis 07/13/2018   Intractable migraine without aura and without status migrainosus 07/13/2018   High risk medication use 07/13/2018   Calculus of gallbladder with acute and chronic cholecystitis with obstruction 06/19/2017   Ulnar neuropathy of left upper extremity 04/05/2017   Primary osteoarthritis of left knee 08/10/2015   Mood disorder (HCC) 12/30/2014   Smoker 05/13/2014   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  Pt accompanied by: self  PERTINENT HISTORY:   She was in a coma from February 14, 2023, to February 23, 2023, due to influenza B, pneumonia, and sepsis, requring intubation. Recurrent pneumonia, mild COPD, heartburn   PAIN:  Are you having pain? No  FALLS: Has patient fallen in last 6 months? Yes, Number of falls: 1 on a rollaway bed  PATIENT GOALS: "I would like to be able to sing, but at the least just get my normal voice back and not be hoarse every day."  OBJECTIVE:  Note: Objective measures were completed at Evaluation unless otherwise noted.  DIAGNOSTIC FINDINGS:  MBS  03/01/22 Clinical Impression Patient demonstrates improvement with her swallowing - functional oral swallow with piecemealing noted.     However, she continues with mild pharyngeal dysphagia with ongoing silent aspiration of thin and nectar liquids (mixed with secretions) during the swallow. Impaired laryngeal closure results in spillage of liquid barium into anterior and posterior trach during the swallow.  Chin tuck posture effective to prevent aspiration of nectar but not thin liquid even with tsp amount.    Patient needed encouragement- repositioning in recliner to be fully upright - pillow placed behind her for optimal position.  Patient is stronger overall fortunately however she continues to aspirate without clearance despite cued cough.     Recommend advance to dys3/nectar and allow tsps. Thin water  between meals after mouth care for QOL and to continue to tax system to swallow thinner consistencies.     Pt may benefit from ENT consult due to her ongoing dysphonia - and silent aspiration.  Using video monitor live and teach back post-test - pt educated to recommendations. Pt was made aware to suspicion of advancing to nectar only prior to exam due to ongoing overt coughing with thin consistencies - concerning for overt aspiration. She is agreeable to plan.     Tubed swallow precaution signs to floor for RN.    SLP Visit Diagnosis Dysphagia, pharyngeal phase (R13.13);Dysphagia, pharyngoesophageal phase (R13.14)  Impact on safety and function Mild aspiration risk             03/01/2022    4:13 PM  Treatment Recommendations  Treatment Recommendations Therapy as outlined in treatment plan below          03/01/2022    5:08 PM  Prognosis  Prognosis for Safe Diet Advancement Good  Barriers to Reach Goals Other (Comment)          03/01/2022    4:13 PM  Diet Recommendations  SLP Diet Recommendations Dysphagia 3 (Mech soft) solids;Nectar thick liquid;Other (Comment)  Liquid  Administration via Straw  Medication Administration Crushed with puree  Postural Changes Seated upright at 90 degrees;Remain semi-upright after after feeds/meals (Comment)      ENT - [place] 04/21/23 Assessment and Plan    Chronic Hoarseness for over a year Persistent hoarseness and inability to project  or sing since extubation following an intubation for hypoxic respiratory failure in January 2024. Examination revealed vocal fold nodules and mild edema likely due to phonotrauma and changes c/w GERD LPR, with no subglottic narrowing, other lesions or VF paralysis. Voice therapy and reflux control are recommended with repeat scope exam following full course of voice therapy. We discussed taking Doxy/Medrol  Dose Pack for chronic laryngitis and voice rest x 48 hrs. She has young children and we discussed trying to avoid whispering or shouting.  - Refer to speech therapy for voice therapy. - Prescribe doxycycline  and Medrol  Dosepak. - Advised voice  rest for two days when taking Doxy/Medrol  pack - Increase omeprazole  40 mg to twice daily. - Recommend Reflux Gourmet after meals and before bed to prevent reflux. - Plan follow-up in three months for reassessment and rescope.   PATIENT REPORTED OUTCOME MEASURES (PROM): V-RQOL: returned 06/12/23 with raw score of 40 and V-RQOL score of 60 = fair. Pt also rated her voice quality "fair" in the last two weeks.                                                                                                                            TREATMENT DATE:  Vocal Function Exercises=VFE  07/05/23: Pt out with kidney stones, admitted until 07/03/23. In conversation with pt providing sentence responses she maintained AB 95% of the time for her responses. With VFE, pt's productions did not match the piano note identically but were close. She held productions of /o/ for 12-13 seconds, up from 8-9 when she first began. Pt provided homework for AB with sentence/two  sentence responses.   06/14/23: "My voice is much better today. I didn't really talk at all Monday or Tuesday. Pt used Flonase  since last session "just to be on the safe side - I'm still a bit raspy." SLP introduced AB - pt 100% at rest, with min A. Moved up and had pt respond with yes/no with AB - Shorter breath cycle noted but using AB. Homework for AB at rest (10-15 minutes BID) and AB with yes/no or single word responses using AB (5 minutes every day).  06/12/23: Pt without pain since last exam. Woke Thursday with "scratchy" feeling and hoarse, with allergy sx (sneezing, coughing, post-nasal drip), voice was worse on Friday. Voice has progressively has gotten better  She took Flonase  (additional allergy med) Thursday, Friday and AM on Saturday. Since Thursday pt only used voice when absolutely necessary. SLP educated pt that on these days she should also limit caffeine , and increase water  intake. SLP assisted pt with VFE today and pt req'd cues for more accurate starting and stopping of stopwatch. Not always on pitch for exercise 3 but sufficient.   06/07/23: Pt needs PROM next session. Entered today with speech volume 69dB and did not vary from this for entire session. SLP to target AB next session. SLP introduced AB with pt success at rest at 75%. She will practice this at home for 5-7 minutes/day-SLP suggested pt practice in supine. Pt has reduced caffeine  25%, has told kids to come to her to talk, is going to kids to talk if they are too far away to clap to get their attention, and has incr'd water  intake. She is likely going to get a Office manager. SLP introduced remainder of VFE today with pt requiring mod cues consistently. She will cont these at home.  05/25/23: SLP introduced voice hygiene program for pt today. Pt is already focusing on reducing talk time by doing more texting, and reducing shouting/excessive loudness by snapping or  clapping when she wants her children's attention.She will  focus on decr'ing caffeine  for next session. She will also look into getting an air purfier. SLP introduced vocal function exercises (VFE), exercise #1. She req'd min-mod A initially but was independent by session end. SLP to go over exercise #2a and b, and #3   05/17/23: n/a  PATIENT EDUCATION: Education details: see "treatment date" above for more details Person educated: Patient Education method: Explanation and Demonstration Education comprehension: returned demonstration and needs further education  HOME EXERCISE PROGRAM: Abdominal breathing and VFE.  GOALS: Goals reviewed with patient? Yes generally  SHORT TERM GOALS: Target date:  (due to first day tx), 5/28 due to absences  Pt will complete VFE with rare min A in two sessions Baseline: Goal status: met  2.  Pt will incr water  to 80oz and decr caffeine  intake between 4 consecutive sessions Baseline: 06/07/23 Goal status: INITIAL  3.  Pt will use speech volume at </= average 71 dB 80% of the time in 7 minutes conversation in 2 sessions Baseline: 06/07/23 Goal status: met  4.  Pt will use AB 80% of the time in 7 minutes conversation in 2 sessions Baseline:  Goal status: INITIAL   LONG TERM GOALS: Target date: 07/21/23  Pt's PROM will improve compared to initial score Baseline:  Goal status: INITIAL  2.  Pt's s/z ratio will decr to be closer to 1.0 Baseline:  Goal status: INITIAL  3.   Pt will use speech volume at </= average 71 dB 80% of the time in 10 minutes conversation in 2 sessions Baseline:  Goal status: INITIAL  4.  Pt will tell SLP she has followed at least 2 aspects of vocal hygiene program between 8 sessions Baseline:  Goal status: INITIAL  5.  Pt will complete VFE with mod I in two sessions Baseline:  Goal status: INITIAL  6.  Pt will use AB 80% of the time in 10 minutes conversation in 2 sessions Baseline:  Goal status: INITIAL   ASSESSMENT:  CLINICAL IMPRESSION: Patient is a 38 y.o. F who  was seen today for treatment of voice in light of intubation in January and presence of vocal nodules identified in ENT exam 04/21/22. Cont to progress with VFE and soft gentle speech as well as with AB. See "treatment date" above for today's date for further details on today's session.    OBJECTIVE IMPAIRMENTS: include voice disorder. These impairments are limiting patient from household responsibilities, ADLs/IADLs, and effectively communicating at home and in community. Factors affecting potential to achieve goals and functional outcome are none noted.. Patient will benefit from skilled SLP services to address above impairments and improve overall function.  REHAB POTENTIAL: Good  PLAN:  SLP FREQUENCY: 2x/week  SLP DURATION: 8 weeks  PLANNED INTERVENTIONS: Environmental controls, Internal/external aids, Functional tasks, SLP instruction and feedback, Compensatory strategies, Patient/family education, and [id number] Treatment of speech (30 or 45 min) and voice exercises.    Oceanna Arruda, CCC-SLP 07/05/2023, 9:36 AM   For all possible CPT codes, reference the Planned Interventions line above.     Check all conditions that are expected to impact treatment: {Conditions expected to impact treatment:None of these apply   If treatment provided at initial evaluation, no treatment charged due to lack of authorization.

## 2023-07-11 ENCOUNTER — Ambulatory Visit: Payer: MEDICAID

## 2023-07-11 DIAGNOSIS — R490 Dysphonia: Secondary | ICD-10-CM | POA: Diagnosis not present

## 2023-07-11 NOTE — Therapy (Signed)
 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 Luster Salters at [facility]   Morbid obesity [facility])    Preterm labor    Round ligament pain 09/10/2012   Torn ACL    Past Surgical History:  Procedure Laterality Date   addenoidectomy     CESAREAN SECTION N/A 02/22/2013   Procedure: CESAREAN SECTION/TWINS;  Surgeon: Wendelyn Halter, MD;  Location: [facility];  Service: Obstetrics;  Laterality: N/A;   CHOLECYSTECTOMY     GALLBLADDER SURGERY  2019   NASAL SINUS SURGERY     NEPHRECTOMY     right side   OTHER SURGICAL HISTORY     ulner nerve removal   TONSILLECTOMY     Patient Active Problem List   Diagnosis Date Noted   Sepsis due to undetermined organism (HCC) 02/14/2022   Chronic abdominal wound infection 02/14/2022   Influenza B 02/14/2022   Acute respiratory failure with hypoxia (HCC) 02/14/2022   Hypothermia 02/14/2022   High anion gap metabolic acidosis 02/14/2022   Lactic acidosis 02/14/2022   Nausea & vomiting 02/14/2022   Diarrhea 02/14/2022   Hypokalemia 02/14/2022   Hyponatremia 02/14/2022   AKI (acute kidney injury) (HCC) 02/14/2022   Prolonged QT interval 02/14/2022   Hypoglycemia 02/14/2022   Metabolic acidosis 02/14/2022   Acute metabolic encephalopathy 02/14/2022   Multifocal pneumonia 02/13/2022   Migraine 12/12/2021   Panniculitis 12/12/2021   Bilateral pneumonia 12/12/2021    GAD (generalized anxiety disorder) 01/24/2019   GERD without esophagitis 07/13/2018   Intractable migraine without aura and without status migrainosus 07/13/2018   High risk medication use 07/13/2018   Calculus of gallbladder with acute and chronic cholecystitis with obstruction 06/19/2017   Ulnar neuropathy of left upper extremity 04/05/2017   Primary osteoarthritis of left knee 08/10/2015   Mood disorder (HCC) 12/30/2014   Smoker 05/13/2014   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  Pt accompanied by: self  PERTINENT HISTORY:   She was in a coma from February 14, 2023, to February 23, 2023, due to influenza B, pneumonia, and sepsis, requring intubation. Recurrent pneumonia, mild COPD, heartburn   PAIN:  Are you having pain? No  FALLS: Has patient fallen in last 6 months? Yes, Number of falls: 1 on a rollaway bed  PATIENT GOALS: "I  would like to be able to sing, but at the least just get my normal voice back and not be hoarse every day."  OBJECTIVE:  Note: Objective measures were completed at Evaluation unless otherwise noted.  DIAGNOSTIC FINDINGS:  MBS 03/01/22 Clinical Impression Patient demonstrates improvement with her swallowing - functional oral swallow with piecemealing noted.     However, she continues with mild pharyngeal dysphagia with ongoing silent aspiration of thin and nectar liquids (mixed with  secretions) during the swallow. Impaired laryngeal closure results in spillage of liquid barium into anterior and posterior trach during the swallow.  Chin tuck posture effective to prevent aspiration of nectar but not thin liquid even with tsp amount.    Patient needed encouragement- repositioning in recliner to be fully upright - pillow placed behind her for optimal position.  Patient is stronger overall fortunately however she continues to aspirate without clearance despite cued cough.     Recommend advance to dys3/nectar and allow tsps. Thin water  between meals after mouth care for QOL and to continue to tax system to swallow thinner consistencies.     Pt may benefit from ENT consult due to her ongoing dysphonia - and silent aspiration.  Using video monitor live and teach back post-test - pt educated to recommendations. Pt was made aware to suspicion of advancing to nectar only prior to exam due to ongoing overt coughing with thin consistencies - concerning for overt aspiration. She is agreeable to plan.     Tubed swallow precaution signs to floor for RN.    SLP Visit Diagnosis Dysphagia, pharyngeal phase (R13.13);Dysphagia, pharyngoesophageal phase (R13.14)  Impact on safety and function Mild aspiration risk             03/01/2022    4:13 PM  Treatment Recommendations  Treatment Recommendations Therapy as outlined in treatment plan below          03/01/2022    5:08 PM  Prognosis  Prognosis for Safe Diet Advancement Good  Barriers to Reach Goals Other (Comment)          03/01/2022    4:13 PM  Diet Recommendations  SLP Diet Recommendations Dysphagia 3 (Mech soft) solids;Nectar thick liquid;Other (Comment)  Liquid Administration via Straw  Medication Administration Crushed with puree  Postural Changes Seated upright at 90 degrees;Remain semi-upright after after feeds/meals (Comment)      ENT - [place] 04/21/23 Assessment and Plan    Chronic Hoarseness for over a  year Persistent hoarseness and inability to project  or sing since extubation following an intubation for hypoxic respiratory failure in January 2024. Examination revealed vocal fold nodules and mild edema likely due to phonotrauma and changes c/w GERD LPR, with no subglottic narrowing, other lesions or VF paralysis. Voice therapy and reflux control are recommended with repeat scope exam following full course of voice therapy. We discussed taking Doxy/Medrol  Dose Pack for chronic laryngitis and voice rest x 48 hrs. She has young children and we discussed trying to avoid whispering or shouting.  - Refer to speech therapy for voice therapy. - Prescribe doxycycline  and Medrol  Dosepak. - Advised voice rest for two days when taking Doxy/Medrol  pack - Increase omeprazole  40 mg to twice daily. - Recommend Reflux Gourmet after meals and before bed to prevent reflux. - Plan follow-up in three months for reassessment and rescope.   PATIENT REPORTED OUTCOME MEASURES (PROM): V-RQOL: returned 06/12/23 with raw score of 40 and V-RQOL score of 60 = fair. Pt also rated her voice quality "fair"  in the last two weeks.                                                                                                                            TREATMENT DATE:  Vocal Function Exercises=VFE  07/11/23: Pt reports continuing to practice good vocal hygiene at home with softer voice, not using shouting or yelling (substituting hand clap for getting children's attention), has increased water  consumption to over 80oz/day, and has cut caffeine  down to one 8-12 ounce serving (tea or soda) per day. Two coughs today ("It's coming from my lungs"). SLP told pt she may want to think about getting a chest xray sooner rather than later. [place] is completing VFE once/day, SLP asked pt to complete BID. With VFE, pt completed with rare min A for greater pitch range on "knoll" - told pt to think of Trace Atkins on the low end and Gildardo Labrador  on the high end.  Pt used AB throughout session today. SLP reassured pt she is doing everything she can right now for success and to do VFE BID.   07/05/23: Pt out with kidney stones, admitted until 07/03/23. In conversation with pt providing sentence responses she maintained AB 95% of the time for her responses. With VFE, pt's productions did not match the piano note identically but were close. She held productions of /o/ for 12-13 seconds, up from 8-9 when she first began. Pt provided homework for AB with sentence/two sentence responses.   06/14/23: "My voice is much better today. I didn't really talk at all Monday or Tuesday. Pt used Flonase  since last session "just to be on the safe side - I'm still a bit raspy." SLP introduced AB - pt 100% at rest, with min A. Moved up and had pt respond with yes/no with AB - Shorter breath cycle noted but using AB. Homework for AB at rest (10-15 minutes BID) and AB with yes/no or single word responses using AB (5 minutes every day).  06/12/23: Pt without pain since last exam. Woke Thursday with "scratchy" feeling and hoarse, with allergy sx (sneezing, coughing, post-nasal drip), voice was worse on Friday. Voice has progressively has gotten better  She took Flonase  (additional allergy med) Thursday, Friday and AM on Saturday. Since Thursday pt only used voice when absolutely necessary. SLP educated pt that on these days she should also limit caffeine , and increase water  intake. SLP assisted pt with VFE today and pt req'd cues for more accurate starting and stopping of stopwatch. Not always on pitch for exercise 3 but sufficient.   06/07/23: Pt needs PROM next session. Entered today with speech volume 69dB and did not vary from this for entire session. SLP to target AB next session. SLP introduced AB with pt success at rest at 75%. She will practice this at home for 5-7 minutes/day-SLP suggested pt practice in supine. Pt has reduced caffeine  25%, has told kids to come to her  to talk, is going  to kids to talk if they are too far away to clap to get their attention, and has incr'd water  intake. She is likely going to get a Office manager. SLP introduced remainder of VFE today with pt requiring mod cues consistently. She will cont these at home.  05/25/23: SLP introduced voice hygiene program for pt today. Pt is already focusing on reducing talk time by doing more texting, and reducing shouting/excessive loudness by snapping or clapping when she wants her children's attention.She will focus on decr'ing caffeine  for next session. She will also look into getting an air purfier. SLP introduced vocal function exercises (VFE), exercise #1. She req'd min-mod A initially but was independent by session end. SLP to go over exercise #2a and b, and #3   05/17/23: n/a  PATIENT EDUCATION: Education details: see "treatment date" above for more details Person educated: Patient Education method: Explanation and Demonstration Education comprehension: returned demonstration and needs further education  HOME EXERCISE PROGRAM: Abdominal breathing and VFE.  GOALS: Goals reviewed with patient? Yes generally  SHORT TERM GOALS: Target date:  (due to first day tx), 5/28 due to absences  Pt will complete VFE with rare min A in two sessions Baseline: Goal status: met  2.  Pt will incr water  to 80oz and decr caffeine  intake between 4 consecutive sessions Baseline:  Goal status: met  3.  Pt will use speech volume at </= average 71 dB 80% of the time in 7 minutes conversation in 2 sessions Baseline: 06/07/23 Goal status: met  4.  Pt will use AB 80% of the time in 7 minutes conversation in 2 sessions Baseline:  Goal status: partially met   LONG TERM GOALS: Target date: 07/21/23  Pt's PROM will improve compared to initial score Baseline:  Goal status: INITIAL  2.  Pt's s/z ratio will decr to be closer to 1.0 Baseline:  Goal status: INITIAL  3.   Pt will use speech volume at  </= average 71 dB 80% of the time in 10 minutes conversation in 2 sessions Baseline:  Goal status: INITIAL  4.  Pt will tell SLP she has followed at least 2 aspects of vocal hygiene program between 8 sessions Baseline:  Goal status: INITIAL  5.  Pt will complete VFE with mod I in two sessions Baseline:  Goal status: INITIAL  6.  Pt will use AB 80% of the time in 10 minutes conversation in 2 sessions Baseline:  Goal status: INITIAL   ASSESSMENT:  CLINICAL IMPRESSION: Patient is a 38 y.o. F who was seen today for treatment of voice in light of intubation in January and presence of vocal nodules identified in ENT exam 04/21/22. Cont to progress with VFE and soft gentle speech as well as with AB. See "treatment date" above for today's date for further details on today's session.    OBJECTIVE IMPAIRMENTS: include voice disorder. These impairments are limiting patient from household responsibilities, ADLs/IADLs, and effectively communicating at home and in community. Factors affecting potential to achieve goals and functional outcome are none noted.. Patient will benefit from skilled SLP services to address above impairments and improve overall function.  REHAB POTENTIAL: Good  PLAN:  SLP FREQUENCY: 2x/week  SLP DURATION: 8 weeks  PLANNED INTERVENTIONS: Environmental controls, Internal/external aids, Functional tasks, SLP instruction and feedback, Compensatory strategies, Patient/family education, and [address] Treatment of speech (30 or 45 min) and voice exercises.    Katelen Luepke, CCC-SLP 07/11/2023, 8:09 AM   For all possible CPT codes, reference the  Planned Interventions line above.     Check all conditions that are expected to impact treatment: {Conditions expected to impact treatment:None of these apply   If treatment provided at initial evaluation, no treatment charged due to lack of authorization.

## 2023-07-16 ENCOUNTER — Encounter: Payer: Self-pay | Admitting: Urology

## 2023-07-17 ENCOUNTER — Other Ambulatory Visit (INDEPENDENT_AMBULATORY_CARE_PROVIDER_SITE_OTHER): Payer: Self-pay | Admitting: Otolaryngology

## 2023-07-18 ENCOUNTER — Ambulatory Visit: Payer: MEDICAID | Attending: Otolaryngology

## 2023-07-19 ENCOUNTER — Ambulatory Visit: Payer: MEDICAID | Admitting: Urology

## 2023-07-24 ENCOUNTER — Ambulatory Visit (INDEPENDENT_AMBULATORY_CARE_PROVIDER_SITE_OTHER): Payer: MEDICAID | Admitting: Otolaryngology

## 2023-07-25 ENCOUNTER — Ambulatory Visit: Payer: MEDICAID

## 2023-07-27 ENCOUNTER — Ambulatory Visit: Payer: MEDICAID

## 2023-08-31 ENCOUNTER — Telehealth (INDEPENDENT_AMBULATORY_CARE_PROVIDER_SITE_OTHER): Payer: Self-pay

## 2023-08-31 ENCOUNTER — Ambulatory Visit (INDEPENDENT_AMBULATORY_CARE_PROVIDER_SITE_OTHER): Payer: MEDICAID | Admitting: Otolaryngology

## 2023-08-31 NOTE — Telephone Encounter (Signed)
 I called patient to see if she would be open to coming in earlier. Patient said that something had actually come up and that she needed to reschedule. I transferred her up to the front to reschedule her appt.

## 2023-09-07 ENCOUNTER — Ambulatory Visit (INDEPENDENT_AMBULATORY_CARE_PROVIDER_SITE_OTHER): Payer: MEDICAID | Admitting: Otolaryngology

## 2023-10-04 ENCOUNTER — Emergency Department (HOSPITAL_COMMUNITY)
Admission: EM | Admit: 2023-10-04 | Discharge: 2023-10-04 | Discharge status: Against Medical Advice | Payer: MEDICAID | Attending: Emergency Medicine | Admitting: Emergency Medicine

## 2023-10-04 ENCOUNTER — Other Ambulatory Visit: Payer: Self-pay

## 2023-10-04 ENCOUNTER — Encounter (HOSPITAL_COMMUNITY): Payer: Self-pay | Admitting: Emergency Medicine

## 2023-10-04 DIAGNOSIS — R112 Nausea with vomiting, unspecified: Secondary | ICD-10-CM | POA: Diagnosis not present

## 2023-10-04 DIAGNOSIS — Z5321 Procedure and treatment not carried out due to patient leaving prior to being seen by health care provider: Secondary | ICD-10-CM | POA: Insufficient documentation

## 2023-10-04 DIAGNOSIS — R109 Unspecified abdominal pain: Secondary | ICD-10-CM | POA: Insufficient documentation

## 2023-10-04 LAB — COMPREHENSIVE METABOLIC PANEL WITH GFR
ALT: 28 U/L (ref 0–44)
AST: 28 U/L (ref 15–41)
Albumin: 3.4 g/dL — ABNORMAL LOW (ref 3.5–5.0)
Alkaline Phosphatase: 88 U/L (ref 38–126)
Anion gap: 11 (ref 5–15)
BUN: 12 mg/dL (ref 6–20)
CO2: 16 mmol/L — ABNORMAL LOW (ref 22–32)
Calcium: 8.8 mg/dL — ABNORMAL LOW (ref 8.9–10.3)
Chloride: 113 mmol/L — ABNORMAL HIGH (ref 98–111)
Creatinine, Ser: 0.76 mg/dL (ref 0.44–1.00)
GFR, Estimated: >60 mL/min (ref >60)
Glucose, Bld: 90 mg/dL (ref 70–99)
Potassium: 3.5 mmol/L (ref 3.5–5.1)
Sodium: 140 mmol/L (ref 135–145)
Total Bilirubin: 0.6 mg/dL (ref 0.0–1.2)
Total Protein: 6.3 g/dL — ABNORMAL LOW (ref 6.5–8.1)

## 2023-10-04 LAB — CBC
HCT: 40.9 % (ref 36.0–46.0)
Hemoglobin: 13.8 g/dL (ref 12.0–15.0)
MCH: 30.9 pg (ref 26.0–34.0)
MCHC: 33.7 g/dL (ref 30.0–36.0)
MCV: 91.7 fL (ref 80.0–100.0)
Platelets: 285 K/uL (ref 150–400)
RBC: 4.46 MIL/uL (ref 3.87–5.11)
RDW: 13.8 % (ref 11.5–15.5)
WBC: 8.8 K/uL (ref 4.0–10.5)
nRBC: 0 % (ref 0.0–0.2)

## 2023-10-04 LAB — LIPASE, BLOOD: Lipase: 23 U/L (ref 11–51)

## 2023-10-04 NOTE — ED Notes (Signed)
 Pt came to triage room with concerns about wait and her prioritization for room.  Triage process explained.  Pt unhappy stating she was being ignored.  Re iterated triage process and what interventions had been done for the pt already by EMS and ED staff. Pt elected to leave.  IV removed.  Pt ambulated out of dept with friends in NAD

## 2023-10-04 NOTE — ED Triage Notes (Signed)
 BIB EMS from home.  N/v/d and abdominal pain since 3am.  Vomiting is reported to be dark and then progressed to looking red.  18G LAC.  Given 4mg  zofran  and 50 mcg fentanyl .

## 2023-10-06 ENCOUNTER — Ambulatory Visit: Payer: MEDICAID | Admitting: Urology

## 2023-10-19 ENCOUNTER — Ambulatory Visit (INDEPENDENT_AMBULATORY_CARE_PROVIDER_SITE_OTHER): Payer: MEDICAID | Admitting: Otolaryngology

## 2023-10-20 ENCOUNTER — Ambulatory Visit (INDEPENDENT_AMBULATORY_CARE_PROVIDER_SITE_OTHER): Payer: MEDICAID | Admitting: Otolaryngology

## 2023-11-23 ENCOUNTER — Ambulatory Visit (INDEPENDENT_AMBULATORY_CARE_PROVIDER_SITE_OTHER): Payer: MEDICAID | Admitting: Otolaryngology

## 2023-11-23 ENCOUNTER — Encounter (INDEPENDENT_AMBULATORY_CARE_PROVIDER_SITE_OTHER): Payer: Self-pay | Admitting: Otolaryngology

## 2023-11-23 VITALS — BP 88/60 | HR 109 | Ht 67.0 in | Wt 256.0 lb

## 2023-11-23 DIAGNOSIS — R0982 Postnasal drip: Secondary | ICD-10-CM | POA: Diagnosis not present

## 2023-11-23 DIAGNOSIS — J3089 Other allergic rhinitis: Secondary | ICD-10-CM

## 2023-11-23 DIAGNOSIS — K219 Gastro-esophageal reflux disease without esophagitis: Secondary | ICD-10-CM

## 2023-11-23 DIAGNOSIS — J342 Deviated nasal septum: Secondary | ICD-10-CM

## 2023-11-23 DIAGNOSIS — J343 Hypertrophy of nasal turbinates: Secondary | ICD-10-CM

## 2023-11-23 DIAGNOSIS — R0981 Nasal congestion: Secondary | ICD-10-CM

## 2023-11-23 DIAGNOSIS — R490 Dysphonia: Secondary | ICD-10-CM

## 2023-11-23 DIAGNOSIS — J382 Nodules of vocal cords: Secondary | ICD-10-CM

## 2023-11-23 NOTE — Patient Instructions (Signed)
 Lloyd Huger Med Nasal Saline Rinse   - start nasal saline rinses with [employer]le available over the counter or online to help with nasal congestion

## 2023-11-23 NOTE — Progress Notes (Signed)
 ENT Progress Note:   Update 11/23/2023  Discussed the use of AI scribe software for clinical note transcription with the patient, who gave verbal consent to proceed.  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  She has experienced improvement in her voice/hoarseness following speech therapy, noting reduced hoarseness.   Her current medications include Zepbound, Spiriva , Zyrtec , and Flonase . She also takes omeprazole  twice daily for reflux. She attempted nasal rinses but finds the solution goes down her throat despite leaning forward. She has watched instructional videos but remains unsure if she is performing the technique correctly.  Records Reviewed:  Initial Evaluation  Reason for Consult: dysphonia after intubation/coma    HPI: Discussed the use of AI scribe software for clinical note transcription with the patient, who gave verbal consent to proceed.  History of Present Illness   The patient is a 38 yoF with hx of Influenza/PNA and hypoxic respiratory failure requiring intubation and ICU stay presents with persistent hoarseness and vocal changes following intubation. She was referred by a pulmonologist for evaluation of vocal changes and lung issues.  Since the intubation, she has experienced persistent hoarseness, inability to project and sound louder, and difficulty singing, which she could do prior to the coma. She was in a coma from February 14, 2023, to February 23, 2023, due to influenza B, pneumonia, and sepsis, requring intubation, of unknown duration. Since then, she has been coughing up 'really nasty stuff' and blowing green mucus from her nose.  She has a history of recurrent pneumonia, having had it approximately nine times, and was recently diagnosed with mild COPD. She quit smoking in 2023 and currently uses Spiriva  for her lung condition. She experiences shortness of breath with  movement and is in the process of losing weight, having lost 16 pounds in two months on Zepbound.  She has a history of heartburn and takes omeprazole  daily before breakfast. She reports occasional swallowing difficulties but denies chronic issues.  She has been experiencing a rash on her eyelid and is undergoing testing for lupus.  She has three children, two of whom live with her constantly, while the oldest splits time between her and his father. She has some exposure to secondhand smoke from a friend who smokes, though not daily. She takes Singulair daily for nasal congestion and has difficulty using Flonase  correctly.      Records Reviewed:  Pulm office visit 03/03/23 38 year old, former smoker quit in Dec 2023 (7.5 pack year hx), second hand exposure.    Previous [facility] pulmonary encounter: Dr. Adolm with history of severe obesity, GERD, loin pain hematuria s/p nephrectomy recent admission to ICU for acute hypoxic respiratory failure due to influenza B pneumonia, possible superimposed CAP requiring intubation.   Had another admission 03/24/22 at [facility]   She was treated for UTI with nonobstructing stone in left kidney.    She now has very productive cough- lots of mucus, greenish, occasionally blood tinged. She has severe DOE. This has been going on since maybe a week since she was discharged from [facility]. She says she usually feels better when she's on antibiotics then worse when she's off them.   Had covid-19 a couple times but was never hospitalized for it.    Otherwise pertinent review of systems is negative.   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 Was in law enforcement distant past. She smoked for 8 years - half ppd, quit 11/2021. No vaping, MJ. She has a cat.   The patient, with a history of hypoxic respiratory failure due to influenza and pneumonia, presents with a persistent cough and voice  hoarseness. The cough, initially present after a prolonged ICU stay and intubation in February of the previous year, had resolved for several months but has recently returned. The patient describes coughing up large, solid, gray mucus plugs, similar to those experienced post-ICU stay. The cough is particularly bothersome at night, with associated wheezing noted by the patient.   The patient also reports persistent voice hoarseness since her ICU stay, with no improvement over time. She has not been able to scream or raise her voice since the ICU stay.   The patient has a history of smoking, with an estimated 7.5 pack-year history, but quit smoking before her ICU admission. She also reports a history of secondhand smoke exposure during childhood.   The patient has been on Breztri  for her respiratory symptoms, but the effectiveness of this treatment is unclear. She also takes omeprazole  for reflux and Mucinex  DM at night.  1. Voice hoarsenesses (Primary) - Ambulatory referral to ENT   2. Acute bronchitis with COPD (HCC) - DG Chest 2 View; Future      Post-Intubation Vocal Cord Dysfunction -Persistent hoarseness since ICU stay and intubation in 2024. No prior ENT evaluation. Refer to Dr. Solotova for ENT evaluation.   Chronic Obstructive Pulmonary Disease (COPD) Mild obstructive lung disease on PFTs without reversibility. Recent increase in cough and mucus production. History of smoking with 7.5 pack-year history, quit in 2023. -Order chest X-ray today. If abnormal, consider CT scan. -Discontinue Breztri  and start Spiriva  Respimat 2.5mcg  -Continue Albuterol  every 4-6 hours as needed for chest tightness or shortness of breath. -Continue Mucinex  twice daily.   Gastroesophageal Reflux Disease (GERD) - Continue Omeprazole  40mg  prior to dinner   Snoring - Patient would like to hold off on sleep study but likely needs to be address at follow-up    Follow-up in 3 months or sooner if symptoms  worsen.     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 Janit at [facility]   Morbid obesity [facility])    Preterm labor    Round ligament pain 09/10/2012   Torn ACL     Past Surgical History:  Procedure Laterality Date   addenoidectomy     CESAREAN SECTION N/A 02/22/2013   Procedure: CESAREAN SECTION/TWINS;  Surgeon: Vonn VEAR Inch, MD;  Location: [facility];  Service: Obstetrics;  Laterality: N/A;   CHOLECYSTECTOMY     GALLBLADDER SURGERY  2019   NASAL SINUS SURGERY     NEPHRECTOMY     right side   OTHER SURGICAL HISTORY     ulner nerve removal   TONSILLECTOMY      Family History  Problem Relation Age of Onset   Hypertension Mother    Hyperlipidemia Mother    Heart disease Mother    Heart disease Father  MI   Alcohol abuse Father    Alcohol abuse Brother    Diabetes Maternal Grandmother    Hyperlipidemia Maternal Grandmother     Social History:  reports that she quit smoking about 23 months ago. Her smoking use included cigarettes. She started smoking about 10 years ago. She has a 4.4 pack-year smoking history. She has never used smokeless tobacco. She reports that she does not drink alcohol and does not use drugs.  Allergies:  Allergies  Allergen Reactions   Cinnamon Anaphylaxis    Cinnamon flavoring    Other Swelling    Artificial cinnamon causes tongue to swell   Reglan [Metoclopramide] Other (See Comments)    Tactile disturbances REACTION: causes skin to crawl   Cinnamon Flavoring Agent (Non-Screening) Swelling    Artificial cinnamon causes tongue to swell   Codeine Nausea Only    Straight codeine only Percocet &  vicodin are tolerated by pt.   Flomax  [Tamsulosin ] Other (See Comments)    Other reaction(s): Other (See Comments) Oral sores Oral sores   Ibuprofen  Other (See Comments)    Stomach pain Causes severe pain in stomach Pt can tolerate if used sparingly   Levofloxacin Hives   Tylenol  [Acetaminophen ] Other (See Comments)    Pt reports Tylenol  causes her kidney to bleed.    Medications: I have reviewed the patient's current medications.  The PMH, PSH, Medications, Allergies, and SH were reviewed and updated.  ROS: Constitutional: Negative for fever, weight loss and weight gain. Cardiovascular: Negative for chest pain and dyspnea on exertion. Respiratory: Is not experiencing shortness of breath at rest. Gastrointestinal: Negative for nausea and vomiting. Neurological: Negative for headaches. Psychiatric: The patient is not nervous/anxious  Blood pressure (!) 88/60, pulse (!) 109, height 5' 7 (1.702 m), weight 256 lb (116.1 kg), SpO2 94%. Body mass index is 40.1 kg/m.   PHYSICAL EXAM:  Exam: General: Well-developed, well-nourished Communication and Voice: raspy Respiratory Respiratory effort: Equal inspiration and expiration without stridor Cardiovascular Peripheral Vascular: Warm extremities with equal color/perfusion Eyes: No nystagmus with equal extraocular motion bilaterally Neuro/Psych/Balance: Patient oriented to person, place, and time; Appropriate mood and affect; Gait is intact with no imbalance; Cranial nerves I-XII are intact Head and Face Inspection: Normocephalic and atraumatic without mass or lesion Palpation: Facial skeleton intact without bony stepoffs Salivary Glands: No mass or tenderness Facial Strength: Facial motility symmetric and full bilaterally ENT Pinna: External ear intact and fully developed External canal: Canal is patent with intact skin Tympanic Membrane: Clear and mobile External Nose: No scar or anatomic deformity Internal Nose: Septum is  deviated to the left. No polyp, or purulence. Mucosal edema and erythema present.  Bilateral inferior turbinate hypertrophy.  Lips, Teeth, and gums: Mucosa and teeth intact and viable TMJ: No pain to palpation with full mobility Oral cavity/oropharynx: No erythema or exudate, no lesions present Nasopharynx: No mass or lesion with intact mucosa Hypopharynx: Intact mucosa without pooling of secretions Larynx Glottic: Full true vocal cord mobility without lesion or mass  Supraglottic: Normal appearing epiglottis and AE folds Interarytenoid Space: Moderate pachydermia&edema Subglottic Space: Patent without lesion or edema Neck Neck and Trachea: Midline trachea without mass or lesion Thyroid : No mass or nodularity Lymphatics: No lymphadenopathy  Procedure:  Preoperative diagnosis: hoarseness  Postoperative diagnosis:   same + post-nasal drainage + GERD LPR  Procedure: Flexible fiberoptic laryngoscopy   Surgeon: Elena Larry, MD  Anesthesia: Topical lidocaine  and Afrin  Complications: None  Condition is stable throughout exam  Indications and consent:  The patient presents to the clinic with hoarseness. All the risks, benefits, and potential complications were reviewed with the patient preoperatively and informed verbal consent was obtained.  Procedure: The patient was seated upright in the exam chair.   Topical lidocaine  and Afrin were applied to the nasal cavity. After adequate anesthesia had occurred, the flexible telescope with strobe capabilities was passed into the nasal cavity. The nasopharynx was patent without mass or lesion. The scope was passed behind the soft palate and directed toward the base of tongue. The base of tongue was visualized and was symmetric with no apparent masses or abnormal appearing tissue. There were no signs of a mass or pooling of secretions in the piriform sinuses. The supraglottic structures were normal.  Studies Reviewed: 03/03/2023  CXR FINDINGS: No consolidation, pneumothorax or effusion. No edema. Normal cardiopericardial silhouette. Slight degenerative changes of the spine on lateral view.   IMPRESSION: No acute cardiopulmonary disease.  Assessment/Plan: Encounter Diagnoses  Name Primary?   Chronic GERD Yes   Dysphonia    Vocal fold nodules    Hypertrophy of both inferior nasal turbinates    Environmental and seasonal allergies    Chronic nasal congestion    Nasal septal deviation    Post-nasal drip      Assessment and Plan    Chronic Hoarseness for over a year Persistent hoarseness and inability to project  or sing since extubation following an intubation for hypoxic respiratory failure in January 2024. Examination revealed vocal fold nodules and mild edema likely due to phonotrauma and changes c/w GERD LPR, with no subglottic narrowing, other lesions or VF paralysis. Voice therapy and reflux control are recommended with repeat scope exam following full course of voice therapy. We discussed taking Doxy/Medrol  Dose Pack for chronic laryngitis and voice rest x 48 hrs. She has young children and we discussed trying to avoid whispering or shouting.  - Refer to speech therapy for voice therapy. - Prescribe doxycycline  and Medrol  Dosepak. - Advised voice rest for two days when taking Doxy/Medrol  pack - Increase omeprazole  40 mg to twice daily. - Recommend Reflux Gourmet after meals and before bed to prevent reflux. - Plan follow-up in three months for reassessment and rescope.  Gastroesophageal Reflux Disease (GERD) GERD likely contributing to voice changes. Potential exacerbation due to Zepbound medication she currently takes for weight loss.  - Increase omeprazole  40 mg to twice daily. - Recommend Reflux Gourmet after meals and before bed. - diet and lifestyle changes   Chronic Nasal congestion and concern for Sinusitis Complaints of coughing up green mucus and blowing green discharge from the nose. Exam  including nasal endoscopy with small amount of crusting/thick mucus in nasal passages. No definitive sinus infection diagnosed, but nasal congestion and discharge present. Nasal saline rinses are recommended to clear the discharge. Will be taking Doxy, which will cover for any inflammation in the sinuses as well.  - Recommend nasal saline rinses with [employer]Med bottle. - trial of Zyrtec  10 mg daily and Flonase  2 puffs b/l nares BID - will consider CT sinuses in the future and allergy testing if sx persist  Chronic Obstructive Pulmonary Disease (COPD) Mild COPD, former smoker, quit in 2023. Currently on Spiriva  inhaler. No acute exacerbation noted. - continue to f/u with Pulm and use Spiriva  - we discussed rinsing after inhaler use  Suspected Lupus Undergoing testing for lupus due to recent rash on eyelids/cheeks and other symptoms. Seeing rheumatology and neurology for evaluation. - continue current workup  BMI > 47  Weight Loss Management Currently on Zepbound for weight loss, with a reported loss of 16 pounds in two months. Aware of potential side effects including reflux.  - continue current therapy  Update 11/23/2023 Assessment and Plan Assessment & Plan Dysphonia, improved after voice therapy Repeat scope exam without evidence of VF nodules. She had post-nasal drainage and changes c/w GERD LPR and we discussed importance of continuing her current regimen for both.  - medical management of GERD and PND  Postnasal drip and chronic nasal congestion Persistent postnasal drainage with secretions in the nasopharynx. - Research and improve technique for nasal saline irrigations. - Continue current medications and treatments including Zyrtec  and Flonase   Gastroesophageal reflux disease Managed with omeprazole  twice daily. - continue Omeprazole    -  Reflux Gourmet after meals - diet and lifestyle changes to minimize GERD - Refer to [employer] blog for dietary and lifestyle  modifications/reflux cook book  Elena Larry, MD Otolaryngology [facility] Phone: [phone] Fax: [phone]    11/23/2023, 10:16 AM

## 2023-11-29 ENCOUNTER — Other Ambulatory Visit: Payer: Self-pay | Admitting: Medical Genetics

## 2023-11-29 DIAGNOSIS — Z006 Encounter for examination for normal comparison and control in clinical research program: Secondary | ICD-10-CM

## 2023-12-14 ENCOUNTER — Ambulatory Visit: Payer: MEDICAID | Attending: Orthopedic Surgery | Admitting: Physical Therapy

## 2023-12-14 ENCOUNTER — Encounter: Payer: Self-pay | Admitting: Physical Therapy

## 2023-12-14 DIAGNOSIS — R600 Localized edema: Secondary | ICD-10-CM | POA: Diagnosis present

## 2023-12-14 DIAGNOSIS — M6281 Muscle weakness (generalized): Secondary | ICD-10-CM | POA: Diagnosis present

## 2023-12-14 DIAGNOSIS — M25562 Pain in left knee: Secondary | ICD-10-CM | POA: Diagnosis present

## 2023-12-14 DIAGNOSIS — R262 Difficulty in walking, not elsewhere classified: Secondary | ICD-10-CM | POA: Diagnosis present
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
## 2023-12-26 ENCOUNTER — Ambulatory Visit: Payer: MEDICAID | Admitting: Physical Therapy

## 2023-12-28 ENCOUNTER — Ambulatory Visit: Payer: MEDICAID | Admitting: Physical Therapy

## 2024-01-01 ENCOUNTER — Ambulatory Visit: Payer: MEDICAID | Attending: Orthopedic Surgery | Admitting: Physical Therapy

## 2024-01-01 ENCOUNTER — Encounter: Payer: Self-pay | Admitting: Physical Therapy

## 2024-01-01 DIAGNOSIS — M25562 Pain in left knee: Secondary | ICD-10-CM | POA: Insufficient documentation

## 2024-01-01 DIAGNOSIS — R600 Localized edema: Secondary | ICD-10-CM | POA: Insufficient documentation

## 2024-01-01 DIAGNOSIS — M6281 Muscle weakness (generalized): Secondary | ICD-10-CM | POA: Insufficient documentation

## 2024-01-01 DIAGNOSIS — R262 Difficulty in walking, not elsewhere classified: Secondary | ICD-10-CM | POA: Diagnosis present
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
## 2024-01-04 ENCOUNTER — Ambulatory Visit: Payer: MEDICAID | Admitting: Physical Therapy

## 2024-01-08 ENCOUNTER — Ambulatory Visit: Payer: MEDICAID | Admitting: Physical Therapy

## 2024-01-15 ENCOUNTER — Ambulatory Visit: Payer: MEDICAID | Admitting: Physical Therapy

## 2024-01-17 ENCOUNTER — Ambulatory Visit: Payer: MEDICAID | Attending: Orthopedic Surgery | Admitting: Physical Therapy

## 2024-01-22 ENCOUNTER — Ambulatory Visit: Payer: MEDICAID | Admitting: Physical Therapy

## 2024-01-24 ENCOUNTER — Ambulatory Visit: Payer: MEDICAID | Attending: Orthopedic Surgery | Admitting: Physical Therapy

## 2024-01-26 ENCOUNTER — Ambulatory Visit: Payer: MEDICAID | Attending: Orthopedic Surgery | Admitting: Physical Therapy

## 2024-01-26 ENCOUNTER — Encounter: Payer: Self-pay | Admitting: Physical Therapy

## 2024-01-26 DIAGNOSIS — R600 Localized edema: Secondary | ICD-10-CM | POA: Diagnosis present

## 2024-01-26 DIAGNOSIS — M25562 Pain in left knee: Secondary | ICD-10-CM | POA: Diagnosis present

## 2024-01-26 DIAGNOSIS — M6281 Muscle weakness (generalized): Secondary | ICD-10-CM | POA: Diagnosis present

## 2024-01-26 DIAGNOSIS — R262 Difficulty in walking, not elsewhere classified: Secondary | ICD-10-CM | POA: Diagnosis present
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
## 2024-01-29 ENCOUNTER — Ambulatory Visit: Payer: MEDICAID | Admitting: Physical Therapy

## 2024-01-31 ENCOUNTER — Ambulatory Visit: Payer: MEDICAID | Admitting: Physical Therapy

## 2024-02-05 ENCOUNTER — Ambulatory Visit: Payer: MEDICAID | Admitting: Physical Therapy
